# Patient Record
Sex: Female | Born: 1935
Health system: Southern US, Community
[De-identification: ages and names within clinical notes are randomized; demographics above are authoritative.]

## PROBLEM LIST (undated history)

## (undated) DIAGNOSIS — R001 Bradycardia, unspecified: Secondary | ICD-10-CM

## (undated) DIAGNOSIS — Z950 Presence of cardiac pacemaker: Secondary | ICD-10-CM

## (undated) DIAGNOSIS — I5020 Unspecified systolic (congestive) heart failure: Secondary | ICD-10-CM

## (undated) DIAGNOSIS — M1990 Unspecified osteoarthritis, unspecified site: Secondary | ICD-10-CM

## (undated) DIAGNOSIS — I509 Heart failure, unspecified: Secondary | ICD-10-CM

## (undated) DIAGNOSIS — I10 Essential (primary) hypertension: Secondary | ICD-10-CM

## (undated) DIAGNOSIS — E785 Hyperlipidemia, unspecified: Secondary | ICD-10-CM

## (undated) DIAGNOSIS — I459 Conduction disorder, unspecified: Secondary | ICD-10-CM

## (undated) DIAGNOSIS — Z8619 Personal history of other infectious and parasitic diseases: Secondary | ICD-10-CM

## (undated) DIAGNOSIS — I358 Other nonrheumatic aortic valve disorders: Secondary | ICD-10-CM

## (undated) DIAGNOSIS — R0600 Dyspnea, unspecified: Secondary | ICD-10-CM

## (undated) DIAGNOSIS — I639 Cerebral infarction, unspecified: Secondary | ICD-10-CM

## (undated) DIAGNOSIS — I480 Paroxysmal atrial fibrillation: Secondary | ICD-10-CM

## (undated) DIAGNOSIS — Z9289 Personal history of other medical treatment: Secondary | ICD-10-CM

## (undated) DIAGNOSIS — E119 Type 2 diabetes mellitus without complications: Secondary | ICD-10-CM

## (undated) HISTORY — DX: Personal history of other infectious and parasitic diseases: Z86.19

## (undated) HISTORY — DX: Presence of cardiac pacemaker: Z95.0

## (undated) HISTORY — DX: Bradycardia, unspecified: R00.1

## (undated) HISTORY — DX: Dyspnea, unspecified: R06.00

## (undated) HISTORY — DX: Unspecified osteoarthritis, unspecified site: M19.90

## (undated) HISTORY — PX: ABDOMINAL HYSTERECTOMY: SHX81

## (undated) HISTORY — PX: OTHER SURGICAL HISTORY: SHX169

## (undated) HISTORY — DX: Conduction disorder, unspecified: I45.9

## (undated) HISTORY — DX: Paroxysmal atrial fibrillation: I48.0

## (undated) HISTORY — DX: Unspecified systolic (congestive) heart failure: I50.20

## (undated) HISTORY — DX: Other nonrheumatic aortic valve disorders: I35.8

## (undated) HISTORY — DX: Cerebral infarction, unspecified: I63.9

## (undated) HISTORY — PX: JOINT REPLACEMENT: SHX530

## (undated) HISTORY — PX: PACEMAKER PLACEMENT: SHX43

## (undated) HISTORY — DX: Hyperlipidemia, unspecified: E78.5

## (undated) HISTORY — DX: Personal history of other medical treatment: Z92.89

---

## 1994-06-06 HISTORY — PX: LUMBAR FUSION: SHX111

## 1997-11-13 ENCOUNTER — Ambulatory Visit (HOSPITAL_COMMUNITY): Admission: RE | Admit: 1997-11-13 | Discharge: 1997-11-13 | Payer: Self-pay | Admitting: Neurosurgery

## 2003-06-07 DIAGNOSIS — I639 Cerebral infarction, unspecified: Secondary | ICD-10-CM

## 2003-06-07 HISTORY — DX: Cerebral infarction, unspecified: I63.9

## 2004-01-14 ENCOUNTER — Inpatient Hospital Stay (HOSPITAL_COMMUNITY): Admission: EM | Admit: 2004-01-14 | Discharge: 2004-01-16 | Payer: Self-pay | Admitting: Emergency Medicine

## 2004-08-03 ENCOUNTER — Ambulatory Visit: Payer: Self-pay | Admitting: Internal Medicine

## 2005-01-26 ENCOUNTER — Encounter: Payer: Self-pay | Admitting: Internal Medicine

## 2005-07-18 ENCOUNTER — Other Ambulatory Visit: Payer: Self-pay

## 2005-07-18 ENCOUNTER — Ambulatory Visit: Payer: Self-pay | Admitting: Unknown Physician Specialty

## 2005-07-28 ENCOUNTER — Ambulatory Visit: Payer: Self-pay | Admitting: Unknown Physician Specialty

## 2005-07-28 ENCOUNTER — Other Ambulatory Visit: Payer: Self-pay

## 2005-09-07 ENCOUNTER — Ambulatory Visit: Payer: Self-pay | Admitting: Internal Medicine

## 2006-09-27 ENCOUNTER — Ambulatory Visit: Payer: Self-pay | Admitting: Internal Medicine

## 2007-05-22 ENCOUNTER — Ambulatory Visit: Payer: Self-pay | Admitting: Gastroenterology

## 2007-10-01 ENCOUNTER — Ambulatory Visit: Payer: Self-pay | Admitting: Internal Medicine

## 2008-04-13 ENCOUNTER — Emergency Department (HOSPITAL_COMMUNITY): Admission: EM | Admit: 2008-04-13 | Discharge: 2008-04-13 | Payer: Self-pay | Admitting: Emergency Medicine

## 2008-11-18 ENCOUNTER — Ambulatory Visit: Payer: Self-pay | Admitting: Internal Medicine

## 2009-02-17 ENCOUNTER — Ambulatory Visit: Payer: Self-pay | Admitting: Neurosurgery

## 2009-11-19 ENCOUNTER — Ambulatory Visit: Payer: Self-pay | Admitting: Internal Medicine

## 2010-09-13 ENCOUNTER — Ambulatory Visit: Payer: Self-pay

## 2010-10-22 NOTE — Discharge Summary (Signed)
NAME:  Tamara Campbell, Tamara Campbell                          ACCOUNT NO.:  [id number]   MEDICAL RECORD NO.:  [id number]                   PATIENT TYPE:  INP   LOCATION:  5524                                 FACILITY:  [facility]   PHYSICIAN:  C. Ulyess Mort, M.D.             DATE OF BIRTH:  10-Nov-1930   DATE OF ADMISSION:  01/14/2004  DATE OF DISCHARGE:  01/16/2004                                 DISCHARGE SUMMARY   RESIDENT PHYSICIAN:  Kathe Mariner, M.D.   DISCHARGE DIAGNOSES:  1. Ischemic cerebrovascular accident.  2. Anemia.  3. Hypertension.  4. Diabetes mellitus type 2.   DISCHARGE MEDICATIONS:  1. Aggrenox one p.o. b.i.d.  2. Foltx one p.o. daily.  3. Zocor 40 mg one p.o. q.h.s.  4. Ziac 10/6.25 one p.o. daily.  5. Clonidine 0.3 mg one p.o. b.i.d.  6. Norvasc 10 mg one p.o. daily.  7. Lotensin 20 mg one p.o. daily.  8. Avandamet 2/500 one p.o. b.i.d.   DISPOSITION:  At discharge, stable.   FOLLOWUP:  Follow up with Dr. Yates Decamp in [place], [place]  within two weeks.  The patient will make appointment at her convenience.  Also, of note, for Dr. Dan Humphreys, patient was evaluated by PT and it was their  recommendation that she have continual physical therapy rehabilitation of  her right arm and hand weakness.  Also, may want to follow up with her  hemoglobin and hematocrit given her discovery of anemia with a low normal  ferritin and low B12 and distant history of melanotic stool.  May consider  colonoscopy work-up as an outpatient.   PROCEDURE PERFORMED:  1. CT of head without contrast which found chronic small vessel disease, but     no acute intracranial abnormalities.  2. MRI/MRA of the brain which found acute areas of subcortical white matter     infarction into the left posterior frontal cortex, significant atrophy,     and small vessel disease, diffusely small basilar artery with a focal     proximal stenosis estimated to be 75-90%, but no significant carotid     siphon  stenosis.  3. Additionally, carotid Dopplers were done which showed no significant     internal carotid artery stenosis bilaterally.  Vertebral artery flow was     antegrade.  Of note, increased velocities in both the CCAs and the right     vertebral of unknown etiology.  It was also noted that study was     technically difficult due to a high bifurcation.   ADMISSION HISTORY:  Tamara Campbell is a 77 year old right-handed African-  American woman with a past medical history significant for hypertension,  hyperlipidemia, and diabetes who presented with a 36-hour history of new  onset right arm weakness/heaviness.  She described the weakness as started  upon arising the day prior to arrival.  She denied any new strenuous  activity or any trauma to  the arm on the day prior.  She says that it was  difficult for her to hold anything or to push off her arm or dress herself.  She called her daughter who took her to [facility].  There she was  evaluated and given a tentative diagnosis of TIA after head CT was negative  and sent home.  Since she has been home she says the weakness has continued  but it has not progressed.  She denied any associated numbness or tingling.  She denied any slurred speech, loss of consciousness, nausea, vomiting,  vision changes, headaches, urinary or fecal incontinence.  Additionally, she  denied any chest pain, shortness of breath, or palpitations.  Also, of note,  her husband of 47 years passed away one week prior to arrival.  His funeral  was three days before she arrived at the hospital.   PHYSICAL EXAMINATION:  VITAL SIGNS:  Pulse 79, blood pressure 120/64,  temperature 98.2, respirations 22, O2 saturation 97% on room air.  GENERAL:  She was a mildly obese, pleasant African-American woman in no  acute distress.  HEENT:  Normocephalic, atraumatic.  Eyes:  Pupils are equal, round, reactive  to light and accommodation.  Extraocular muscle intact.  ENT:  Mucous   membranes moist.  Oropharynx without erythema or exudate.  Of note, multiple  dental fillings were noted.  NECK:  Supple.  No lymphadenopathy or thyromegaly.  No carotid bruits noted.  PULMONARY:  Clear to auscultation bilaterally.  No use of accessory muscles.  CARDIOVASCULAR:  Regular rate and rhythm.  2/6 systolic murmur best heard at  the right upper sternal border.  No radiation to carotids.  2+ pulses  globally.  GASTROINTESTINAL:  Soft, nontender, nondistended abdomen with positive bowel  sounds.  No evidence of rebound or guarding.  EXTREMITIES:  She had mild trace edema bilaterally in her lower extremities.  SKIN:  Warm without evidence of rashes or lesions.  MUSCULOSKELETAL:  4/5 strength in her right upper extremity, 5/5 strength in  her left upper extremity, 5/5 strength in her lower extremities bilaterally,  and good grip strength bilaterally.  No increased weakness noted on the  right as compared to the left.  NEUROLOGIC:  She was alert and oriented x3.  Cranial nerves II-XII were  grossly intact.  She had normal sensation throughout.  She had evidence of  some mild dysmetria on finger-to-nose testing on the right, normal on the  left.  She had mild decrease in her right arm orbiting and she had downward  going Babinski's.  No facial asymmetry was noted.  No ptosis noted.  She had  2+ reflexes globally.   Admission EKG showed a normal sinus rhythm with a rate of approximately 60  and normal axis.  PR intervals were 175 and an increased QRS interval of  142, regular rate and rhythm was noted on EKG as well as flipped T-waves in  3, aVR, and V1.  Admission laboratories:  Sodium 130, potassium 3.4,  chloride 105, bicarbonate 25, BUN 21, creatinine 1.2, glucose 131.  Hemoglobin 9.6, hematocrit 28.9, white blood cell count 4.1, platelets 267,  MCV 80.5, ANC 2.8.  PT 12.4, INR 0.9, PTT 27.   PROBLEM: 1. Ischemic cerebrovascular accident.  The patient was admitted and worked      up for a possible ischemic stroke given her significant risk factors and     right arm weakness that had not resolved.  There was also a concern that  this may be secondary to a conversion reaction because of the stress of     death of her long-time husband.  However, it was discovered on MRI of the     brain that there was an ischemic area of infarction in the left posterior     frontal cortex corresponding with her deficit.  In addition, fasting     lipid panel and cardiac enzymes were ordered.  Cardiac enzymes were     negative.  Because the first set was negative, it was decided not to     cycle these two additional sets because of a low probability of this     being a cardiac event.  Additionally, homocysteine level was evaluated.     This is a risk factor for ischemic stroke.  Her homocysteine level did     return elevated.  Her fasting lipid panel returned within normal limits     with an LDL less than 100 and HDL greater than 40 so no change was made     in her medicines for hyperlipidemia.  PT and OT were asked to evaluate     patient and recommended continued outpatient therapy for rehabilitation     of the right arm and hand.  She will be placed on Aggrenox in order to     help prevent future ischemic event as well as Foltx due to her     hyperhomocysteinemia.  2. Anemia.  The patient's anemia was evaluated by ordering a ferritin which     returned at 36.  A B12 returned at 279.  Given her MCV of 80.5,     borderline low normal, and a hemoglobin of 9.9, it is concerning that Ms.     Fant may be having a GI bleed.  Unable to obtain guaiac of her stools     during this hospital stay.  However, she did describe to me one episode     about three months ago of melanotic stools, so as noted in the     recommendations, I would recommend a colonoscopy in the future to follow     this up.  She was not given any blood during her hospital stay and her     hemoglobin remained stable.   3. Hypertension.  Ms. Salazar was restarted on all home medications.  She did     have a transiently high blood pressure of 170/48 on day two but at time     of discharge her blood pressure was normotensive at 120/64, appropriate     for someone with diabetes.  No change was made in her antihypertensive     regimen.  4. Diabetes.  A1C was measured during hospital stay which was 6.0.  She was     continued on her Avandamet 2/500 b.i.d. while in hospital with good blood     sugar control.   DISCHARGE LABORATORIES:  CBC:  Hemoglobin 9.9, hematocrit 29.7, white blood  cell count 4.8, platelets 265.  Basic metabolic panel:  Sodium 141,  potassium 3.8, chloride 106, bicarbonate 27, BUN 19, creatinine 1.0, glucose  97, __________ folate 439.  Homocysteine 15.5.  Hemoglobin A1C 6.0.  Fasting  lipid panel:  Total cholesterol 163, triglycerides 64, HDL cholesterol 59,  LDL cholesterol 91.  TSH 0.943.  B12 279, ferritin 36.      Keitha Butte, MD  Gary Fleet, M.D.    AK/MEDQ  D:  01/16/2004  T:  01/17/2004  Job:  [id number]   cc:   Yates Decamp  [address]  [place]  [place] [address]  Fax: [phone]

## 2010-12-06 ENCOUNTER — Ambulatory Visit: Payer: Self-pay | Admitting: Internal Medicine

## 2011-03-07 DIAGNOSIS — Z9289 Personal history of other medical treatment: Secondary | ICD-10-CM

## 2011-03-07 HISTORY — DX: Personal history of other medical treatment: Z92.89

## 2011-03-08 LAB — BASIC METABOLIC PANEL
BUN: 28 — ABNORMAL HIGH
CO2: 27
Calcium: 9.5
Chloride: 103
Creatinine, Ser: 1.04
GFR calc Af Amer: >60
GFR calc non Af Amer: 52 — ABNORMAL LOW
Glucose, Bld: 82
Potassium: 4

## 2011-03-08 LAB — DIFFERENTIAL
Basophils Relative: 1
Neutro Abs: 1.3 — ABNORMAL LOW

## 2011-03-08 LAB — CBC
Hemoglobin: 12.3
Platelets: 175
RBC: 4.34
WBC: 3.1 — ABNORMAL LOW

## 2012-08-10 HISTORY — PX: TOTAL KNEE ARTHROPLASTY: SHX125

## 2014-08-18 DIAGNOSIS — Z471 Aftercare following joint replacement surgery: Secondary | ICD-10-CM | POA: Diagnosis not present

## 2014-08-18 DIAGNOSIS — Z96641 Presence of right artificial hip joint: Secondary | ICD-10-CM | POA: Diagnosis not present

## 2014-08-18 DIAGNOSIS — Z96642 Presence of left artificial hip joint: Secondary | ICD-10-CM | POA: Diagnosis not present

## 2014-08-18 DIAGNOSIS — Z96651 Presence of right artificial knee joint: Secondary | ICD-10-CM | POA: Diagnosis not present

## 2014-09-15 DIAGNOSIS — H4011X3 Primary open-angle glaucoma, severe stage: Secondary | ICD-10-CM | POA: Diagnosis not present

## 2014-10-06 DIAGNOSIS — I10 Essential (primary) hypertension: Secondary | ICD-10-CM | POA: Diagnosis not present

## 2014-10-06 DIAGNOSIS — E119 Type 2 diabetes mellitus without complications: Secondary | ICD-10-CM | POA: Diagnosis not present

## 2014-10-22 DIAGNOSIS — B351 Tinea unguium: Secondary | ICD-10-CM | POA: Diagnosis not present

## 2014-10-22 DIAGNOSIS — E119 Type 2 diabetes mellitus without complications: Secondary | ICD-10-CM | POA: Diagnosis not present

## 2014-10-22 DIAGNOSIS — M76822 Posterior tibial tendinitis, left leg: Secondary | ICD-10-CM | POA: Diagnosis not present

## 2014-11-17 DIAGNOSIS — I459 Conduction disorder, unspecified: Secondary | ICD-10-CM | POA: Diagnosis not present

## 2014-11-17 DIAGNOSIS — I10 Essential (primary) hypertension: Secondary | ICD-10-CM | POA: Diagnosis not present

## 2014-11-17 DIAGNOSIS — Z950 Presence of cardiac pacemaker: Secondary | ICD-10-CM | POA: Diagnosis not present

## 2014-11-17 DIAGNOSIS — I340 Nonrheumatic mitral (valve) insufficiency: Secondary | ICD-10-CM | POA: Diagnosis not present

## 2015-01-08 ENCOUNTER — Inpatient Hospital Stay (HOSPITAL_COMMUNITY)
Admission: EM | Admit: 2015-01-08 | Discharge: 2015-01-13 | DRG: 062 | Disposition: A | Discharge status: Skilled Nursing Facility | Payer: Commercial Managed Care - HMO | Attending: Neurology | Admitting: Neurology

## 2015-01-08 ENCOUNTER — Encounter (HOSPITAL_COMMUNITY): Payer: Self-pay | Admitting: *Deleted

## 2015-01-08 ENCOUNTER — Emergency Department (HOSPITAL_COMMUNITY): Payer: Commercial Managed Care - HMO

## 2015-01-08 DIAGNOSIS — R29898 Other symptoms and signs involving the musculoskeletal system: Secondary | ICD-10-CM | POA: Diagnosis not present

## 2015-01-08 DIAGNOSIS — I517 Cardiomegaly: Secondary | ICD-10-CM | POA: Diagnosis not present

## 2015-01-08 DIAGNOSIS — I872 Venous insufficiency (chronic) (peripheral): Secondary | ICD-10-CM | POA: Diagnosis not present

## 2015-01-08 DIAGNOSIS — Z9660 Presence of unspecified orthopedic joint implant: Secondary | ICD-10-CM | POA: Diagnosis present

## 2015-01-08 DIAGNOSIS — G8190 Hemiplegia, unspecified affecting unspecified side: Secondary | ICD-10-CM | POA: Diagnosis not present

## 2015-01-08 DIAGNOSIS — I638 Other cerebral infarction: Secondary | ICD-10-CM | POA: Diagnosis not present

## 2015-01-08 DIAGNOSIS — I6340 Cerebral infarction due to embolism of unspecified cerebral artery: Secondary | ICD-10-CM | POA: Diagnosis not present

## 2015-01-08 DIAGNOSIS — R414 Neurologic neglect syndrome: Secondary | ICD-10-CM | POA: Diagnosis present

## 2015-01-08 DIAGNOSIS — I429 Cardiomyopathy, unspecified: Secondary | ICD-10-CM | POA: Diagnosis not present

## 2015-01-08 DIAGNOSIS — I472 Ventricular tachycardia, unspecified: Secondary | ICD-10-CM

## 2015-01-08 DIAGNOSIS — I10 Essential (primary) hypertension: Secondary | ICD-10-CM | POA: Diagnosis present

## 2015-01-08 DIAGNOSIS — Z950 Presence of cardiac pacemaker: Secondary | ICD-10-CM | POA: Diagnosis not present

## 2015-01-08 DIAGNOSIS — Z79899 Other long term (current) drug therapy: Secondary | ICD-10-CM

## 2015-01-08 DIAGNOSIS — Z9119 Patient's noncompliance with other medical treatment and regimen: Secondary | ICD-10-CM | POA: Diagnosis present

## 2015-01-08 DIAGNOSIS — I63511 Cerebral infarction due to unspecified occlusion or stenosis of right middle cerebral artery: Secondary | ICD-10-CM | POA: Diagnosis not present

## 2015-01-08 DIAGNOSIS — I480 Paroxysmal atrial fibrillation: Secondary | ICD-10-CM | POA: Diagnosis not present

## 2015-01-08 DIAGNOSIS — I4891 Unspecified atrial fibrillation: Secondary | ICD-10-CM | POA: Diagnosis not present

## 2015-01-08 DIAGNOSIS — R262 Difficulty in walking, not elsewhere classified: Secondary | ICD-10-CM | POA: Diagnosis not present

## 2015-01-08 DIAGNOSIS — I63411 Cerebral infarction due to embolism of right middle cerebral artery: Secondary | ICD-10-CM | POA: Diagnosis not present

## 2015-01-08 DIAGNOSIS — H409 Unspecified glaucoma: Secondary | ICD-10-CM | POA: Diagnosis not present

## 2015-01-08 DIAGNOSIS — G936 Cerebral edema: Secondary | ICD-10-CM | POA: Diagnosis not present

## 2015-01-08 DIAGNOSIS — I651 Occlusion and stenosis of basilar artery: Secondary | ICD-10-CM | POA: Diagnosis not present

## 2015-01-08 DIAGNOSIS — E119 Type 2 diabetes mellitus without complications: Secondary | ICD-10-CM | POA: Diagnosis present

## 2015-01-08 DIAGNOSIS — R29810 Facial weakness: Secondary | ICD-10-CM | POA: Diagnosis present

## 2015-01-08 DIAGNOSIS — Z87891 Personal history of nicotine dependence: Secondary | ICD-10-CM

## 2015-01-08 DIAGNOSIS — W19XXXA Unspecified fall, initial encounter: Secondary | ICD-10-CM | POA: Diagnosis not present

## 2015-01-08 DIAGNOSIS — I6789 Other cerebrovascular disease: Secondary | ICD-10-CM | POA: Diagnosis not present

## 2015-01-08 DIAGNOSIS — E785 Hyperlipidemia, unspecified: Secondary | ICD-10-CM | POA: Diagnosis present

## 2015-01-08 DIAGNOSIS — E784 Other hyperlipidemia: Secondary | ICD-10-CM | POA: Diagnosis not present

## 2015-01-08 DIAGNOSIS — I63419 Cerebral infarction due to embolism of unspecified middle cerebral artery: Secondary | ICD-10-CM | POA: Diagnosis not present

## 2015-01-08 DIAGNOSIS — I4729 Other ventricular tachycardia: Secondary | ICD-10-CM

## 2015-01-08 DIAGNOSIS — G8194 Hemiplegia, unspecified affecting left nondominant side: Secondary | ICD-10-CM | POA: Diagnosis not present

## 2015-01-08 DIAGNOSIS — E876 Hypokalemia: Secondary | ICD-10-CM | POA: Diagnosis present

## 2015-01-08 DIAGNOSIS — R531 Weakness: Secondary | ICD-10-CM | POA: Diagnosis present

## 2015-01-08 DIAGNOSIS — I639 Cerebral infarction, unspecified: Secondary | ICD-10-CM | POA: Diagnosis not present

## 2015-01-08 DIAGNOSIS — I481 Persistent atrial fibrillation: Secondary | ICD-10-CM | POA: Diagnosis not present

## 2015-01-08 DIAGNOSIS — I69351 Hemiplegia and hemiparesis following cerebral infarction affecting right dominant side: Secondary | ICD-10-CM | POA: Diagnosis not present

## 2015-01-08 DIAGNOSIS — I69354 Hemiplegia and hemiparesis following cerebral infarction affecting left non-dominant side: Secondary | ICD-10-CM | POA: Diagnosis not present

## 2015-01-08 DIAGNOSIS — S81802A Unspecified open wound, left lower leg, initial encounter: Secondary | ICD-10-CM | POA: Diagnosis present

## 2015-01-08 DIAGNOSIS — I77819 Aortic ectasia, unspecified site: Secondary | ICD-10-CM | POA: Diagnosis not present

## 2015-01-08 DIAGNOSIS — J9811 Atelectasis: Secondary | ICD-10-CM | POA: Diagnosis not present

## 2015-01-08 DIAGNOSIS — M6281 Muscle weakness (generalized): Secondary | ICD-10-CM | POA: Diagnosis not present

## 2015-01-08 DIAGNOSIS — E1142 Type 2 diabetes mellitus with diabetic polyneuropathy: Secondary | ICD-10-CM

## 2015-01-08 HISTORY — DX: Heart failure, unspecified: I50.9

## 2015-01-08 HISTORY — DX: Type 2 diabetes mellitus without complications: E11.9

## 2015-01-08 HISTORY — DX: Cerebral infarction, unspecified: I63.9

## 2015-01-08 HISTORY — DX: Essential (primary) hypertension: I10

## 2015-01-08 LAB — I-STAT CHEM 8, ED
BUN: 24 mg/dL — AB (ref 6–20)
CHLORIDE: 106 mmol/L (ref 101–111)
Calcium, Ion: 1.09 mmol/L — ABNORMAL LOW (ref 1.13–1.30)
Creatinine, Ser: 1 mg/dL (ref 0.44–1.00)
GLUCOSE: 172 mg/dL — AB (ref 65–99)
HEMATOCRIT: 46 % (ref 36.0–46.0)
HEMOGLOBIN: 15.6 g/dL — AB (ref 12.0–15.0)
Potassium: 3.4 mmol/L — ABNORMAL LOW (ref 3.5–5.1)
Sodium: 143 mmol/L (ref 135–145)
TCO2: 20 mmol/L (ref 0–100)

## 2015-01-08 LAB — CBC
HEMATOCRIT: 41.7 % (ref 36.0–46.0)
Hemoglobin: 13.6 g/dL (ref 12.0–15.0)
MCH: 27.5 pg (ref 26.0–34.0)
MCHC: 32.6 g/dL (ref 30.0–36.0)
MCV: 84.4 fL (ref 78.0–100.0)
PLATELETS: 201 10*3/uL (ref 150–400)
RBC: 4.94 MIL/uL (ref 3.87–5.11)
RDW: 15.4 % (ref 11.5–15.5)
WBC: 3.7 10*3/uL — AB (ref 4.0–10.5)

## 2015-01-08 LAB — COMPREHENSIVE METABOLIC PANEL
ALK PHOS: 90 U/L (ref 38–126)
ALT: 20 U/L (ref 14–54)
AST: 28 U/L (ref 15–41)
Albumin: 3.8 g/dL (ref 3.5–5.0)
Anion gap: 12 (ref 5–15)
BILIRUBIN TOTAL: 0.7 mg/dL (ref 0.3–1.2)
BUN: 21 mg/dL — ABNORMAL HIGH (ref 6–20)
CHLORIDE: 106 mmol/L (ref 101–111)
CO2: 21 mmol/L — AB (ref 22–32)
Calcium: 9.4 mg/dL (ref 8.9–10.3)
Creatinine, Ser: 1.05 mg/dL — ABNORMAL HIGH (ref 0.44–1.00)
GFR, EST AFRICAN AMERICAN: 56 mL/min — AB (ref >60)
GFR, EST NON AFRICAN AMERICAN: 48 mL/min — AB (ref >60)
Glucose, Bld: 169 mg/dL — ABNORMAL HIGH (ref 65–99)
Potassium: 3.4 mmol/L — ABNORMAL LOW (ref 3.5–5.1)
Sodium: 139 mmol/L (ref 135–145)
Total Protein: 7 g/dL (ref 6.5–8.1)

## 2015-01-08 LAB — DIFFERENTIAL
BASOS ABS: 0 10*3/uL (ref 0.0–0.1)
BASOS PCT: 0 % (ref 0–1)
EOS PCT: 2 % (ref 0–5)
Eosinophils Absolute: 0.1 10*3/uL (ref 0.0–0.7)
LYMPHS ABS: 1.1 10*3/uL (ref 0.7–4.0)
LYMPHS PCT: 29 % (ref 12–46)
MONO ABS: 0.2 10*3/uL (ref 0.1–1.0)
MONOS PCT: 7 % (ref 3–12)
NEUTROS ABS: 2.3 10*3/uL (ref 1.7–7.7)
Neutrophils Relative %: 62 % (ref 43–77)

## 2015-01-08 LAB — I-STAT TROPONIN, ED: Troponin i, poc: 0.01 ng/mL (ref 0.00–0.08)

## 2015-01-08 LAB — ETHANOL

## 2015-01-08 LAB — APTT: aPTT: 27 seconds (ref 24–37)

## 2015-01-08 LAB — PROTIME-INR
INR: 1.12 (ref 0.00–1.49)
Prothrombin Time: 14.6 seconds (ref 11.6–15.2)

## 2015-01-08 MED ORDER — ALTEPLASE (STROKE) FULL DOSE INFUSION
0.9000 mg/kg | Freq: Once | INTRAVENOUS | Status: AC
  Administered 2015-01-08: 52 mg via INTRAVENOUS
  Filled 2015-01-08: qty 52

## 2015-01-08 MED ORDER — LABETALOL HCL 5 MG/ML IV SOLN
INTRAVENOUS | Status: AC
  Administered 2015-01-08: 5 mg via INTRAVENOUS
  Administered 2015-01-08: 10 mg
  Administered 2015-01-08: 5 mg via INTRAVENOUS
  Filled 2015-01-08: qty 4

## 2015-01-08 NOTE — ED Notes (Signed)
Patient transported to CT with RN transport. Phlebotomy obtained blood prior to start of scan.

## 2015-01-08 NOTE — ED Notes (Signed)
Per [facility]- The patient lives at home with her son. She noted herself to be fine while eating dinner around 6pm but when she went to stand up she noticed her left arm felt weak and fell. CBG 144. Pt was hypertensive 200/120s. Pt has weakness to her left arm and legs. Pt also was noted to have a "syncopal" episode where she gazed to the right and would not answer questions in route. Pt is alert and oriented at present. PIV 20G placed to LAC.

## 2015-01-08 NOTE — H&P (Addendum)
Admission H&P    Chief Complaint: Left sided weakness  HPI: Tamara Campbell is an 81 y.o. female with a history of HTN and diabetes who re[ports noncompliance with her antihypertensives who was at her baseline today.  This evening she sat down to eat supper and noted no problems. When she attempted to get up to leave the table she was unable to support herself and fell.  She noted left sided weakness. EMS was called at that time.  Patient was hypertensive. En route was noted to have a starring spell when she gazed to the right and did not respond to questioning.   Patient has had a stroke in the past in 2005.  Patient had some mild right sided weakness as residual from this per daughter.  At baseline patient lives with son.  She ambulates with a [employer].  She dresses herself and goes to the bathroom independently.  She does not report incontinence but does wear Depends because she reports that she does not always make it to the bathroom.  Her son does the cooking and cleaning.    Date last known well: Date: 01/08/2015 Time last known well: Time: 18:00 tPA Given: Yes  Modified Rankin: Rankin Score=3  Past Medical History  Diagnosis Date  . Diabetes mellitus without complication   . Hypertension   . Stroke   . CHF (congestive heart failure)     Past Surgical History  Procedure Laterality Date  . Joint replacement    . Abdominal hysterectomy    . Pacemaker placement      Family history: Brother with diabetes  Social History:  reports that she has quit smoking. She has never used smokeless tobacco. She reports that she does not drink alcohol or use illicit drugs.  Allergies: No Known Allergies  Medications: Prior to Admission medications   Medication Sig Start Date End Date Taking? Authorizing Provider  amLODipine-benazepril (LOTREL) 10-20 MG per capsule Take 1 capsule by mouth daily.   Yes Historical Provider, MD  atenolol (TENORMIN) 50 MG tablet Take 50 mg by mouth daily.   Yes  Historical Provider, MD  bimatoprost (LUMIGAN) 0.01 % SOLN Place 1 drop into both eyes at bedtime.   Yes Historical Provider, MD  brinzolamide (AZOPT) 1 % ophthalmic suspension Place 1 drop into both eyes 2 (two) times daily.   Yes Historical Provider, MD  cholecalciferol (VITAMIN D) 1000 UNITS tablet Take 2,000 Units by mouth daily.   Yes Historical Provider, MD  ferrous sulfate 325 (65 FE) MG tablet Take 325 mg by mouth daily with breakfast.   Yes Historical Provider, MD  hydrochlorothiazide (HYDRODIURIL) 25 MG tablet Take 25 mg by mouth daily.   Yes Historical Provider, MD  metFORMIN (GLUCOPHAGE) 500 MG tablet Take 500 mg by mouth 2 (two) times daily with a meal.   Yes Historical Provider, MD  potassium chloride (K-DUR,KLOR-CON) 10 MEQ tablet Take 10 mEq by mouth daily.   Yes Historical Provider, MD    ROS: History obtained from the patient  General ROS: negative for - chills, fatigue, fever, night sweats, weight gain or weight loss Psychological ROS: negative for - behavioral disorder, hallucinations, memory difficulties, mood swings or suicidal ideation Ophthalmic ROS: negative for - blurry vision, double vision, eye pain or loss of vision ENT ROS: negative for - epistaxis, nasal discharge, oral lesions, sore throat, tinnitus or vertigo Allergy and Immunology ROS: negative for - hives or itchy/watery eyes Hematological and Lymphatic ROS: negative for - bleeding problems, bruising or  swollen lymph nodes Endocrine ROS: negative for - galactorrhea, hair pattern changes, polydipsia/polyuria or temperature intolerance Respiratory ROS: negative for - cough, hemoptysis, shortness of breath or wheezing Cardiovascular ROS: negative for - chest pain, dyspnea on exertion, edema or irregular heartbeat Gastrointestinal ROS: negative for - abdominal pain, diarrhea, hematemesis, nausea/vomiting or stool incontinence Genito-Urinary ROS: negative for - dysuria, hematuria, incontinence or urinary  frequency/urgency Musculoskeletal ROS: back pain Neurological ROS: as noted in HPI Dermatological ROS: draining blister on right leg  Physical Examination: Blood pressure 148/88, pulse 67, temperature 99 F (37.2 C), temperature source Oral, resp. rate 19, weight 58.06 kg (128 lb), SpO2 99 %.  General Examination:  HEENT-  Normocephalic, no lesions, without obvious abnormality.  Normal external eye and conjunctiva.  Normal TM's bilaterally.  Normal auditory canals and external ears. Normal external nose, mucus membranes and septum.  Normal pharynx. Cardiovascular- S1, S2 normal, pulses palpable throughout   Lungs- chest clear, no wheezing, rales, normal symmetric air entry Abdomen- soft, non-tender; bowel sounds normal; no masses,  no organomegaly Extremities- BLE 2+ edema.  Open sore on left lower leg.  Draining blister on right lower leg. Lymph-no adenopathy palpable Musculoskeletal-no joint tenderness, deformity or swelling Skin-As noted above  Neurological Examination Mental Status: Alert, oriented, thought content appropriate.  Speech fluent without evidence of aphasia.  Able to follow 3 step commands without difficulty.  Left neglect. Cranial Nerves: II: Discs flat bilaterally; Visual fields grossly normal, pupils equal, round, reactive to light and accommodation III,IV, VI: ptosis not present, right gaze preference but able to achieve full extra-ocular motions bilaterally V,VII: left facial droop, facial light touch sensation normal bilaterally VIII: hearing normal bilaterally IX,X: gag reflex present XI: bilateral shoulder shrug XII: midline tongue extension Motor: Right : Upper extremity   5/5    Left:     Upper extremity   3-/5  Lower extremity   3+/5    Lower extremity   1-2/5 Tone and bulk:normal tone throughout; no atrophy noted Sensory: Pinprick and light touch decreased in the left upper extremity Deep Tendon Reflexes: 2+ and symmetric throughout Plantars: Right:  downgoing   Left: downgoing Cerebellar: normal finger-to-nose and normal heel-to-shin testing on the right.  Unable to perform on the left due to weakness Gait: not tested due to safety concerns   Laboratory Studies:   Basic Metabolic Panel:  Recent Labs Lab 01/08/15 2131 01/08/15 2139  NA 139 143  K 3.4* 3.4*  CL 106 106  CO2 21*  --   GLUCOSE 169* 172*  BUN 21* 24*  CREATININE 1.05* 1.00  CALCIUM 9.4  --     Liver Function Tests:  Recent Labs Lab 01/08/15 2131  AST 28  ALT 20  ALKPHOS 90  BILITOT 0.7  PROT 7.0  ALBUMIN 3.8   No results for input(s): LIPASE, AMYLASE in the last 168 hours. No results for input(s): AMMONIA in the last 168 hours.  CBC:  Recent Labs Lab 01/08/15 2131 01/08/15 2139  WBC 3.7*  --   NEUTROABS 2.3  --   HGB 13.6 15.6*  HCT 41.7 46.0  MCV 84.4  --   PLT 201  --     Cardiac Enzymes: No results for input(s): CKTOTAL, CKMB, CKMBINDEX, TROPONINI in the last 168 hours.  BNP: Invalid input(s): POCBNP  CBG: No results for input(s): GLUCAP in the last 168 hours.  Microbiology: No results found for this or any previous visit.  Coagulation Studies:  Recent Labs  01/08/15 2131  LABPROT  14.6  INR 1.12    Urinalysis: No results for input(s): COLORURINE, LABSPEC, PHURINE, GLUCOSEU, HGBUR, BILIRUBINUR, KETONESUR, PROTEINUR, UROBILINOGEN, NITRITE, LEUKOCYTESUR in the last 168 hours.  Invalid input(s): APPERANCEUR  Lipid Panel:  No results found for: CHOL, TRIG, HDL, CHOLHDL, VLDL, LDLCALC  HgbA1C: No results found for: HGBA1C  Urine Drug Screen:  No results found for: LABOPIA, COCAINSCRNUR, LABBENZ, AMPHETMU, THCU, LABBARB  Alcohol Level:   Recent Labs Lab 01/08/15 2131  ETH <5    Other results: EKG: atrial fibrillation, rate 85 bpm.  Imaging: Ct Head Wo Contrast  01/08/2015   CLINICAL DATA:  Left arm weakness.  EXAM: CT HEAD WITHOUT CONTRAST  TECHNIQUE: Contiguous axial images were obtained from the base of the  skull through the vertex without intravenous contrast.  COMPARISON:  Head CT scan 01/14/2004.  FINDINGS: There is atrophy and extensive chronic microvascular ischemic change. No acute intracranial abnormality including hemorrhage, infarct, mass lesion, mass effect, midline shift or abnormal extra-axial fluid collection is identified. No hydrocephalus or pneumocephalus. The calvarium is intact.  IMPRESSION: No acute abnormality.  Atrophy and extensive chronic microvascular ischemic change.   Electronically Signed   By: Drusilla Kanner M.D.   On: 01/08/2015 21:43    Assessment: 81 y.o. female with a history of diabetes and hypertension who presents with new onset left hemiparesis and left neglect.  Head CT reviewed independently and shows no acute changes but evidence of extensive small vessel disease.  BP elevated.  Risks and benefits of tPA discussed with patient and daughter.  Contraindications reviewed.  Additional time was required because it was unclear what medications the patient was taking.  This was not fully resolved until the patient's daughter arrived since the patient reported that she felt that she may be on an anticoagulant.  Once this was ruled out, BP was controlled and tPA was administered.  No complications noted.      Stroke Risk Factors - atrial fibrillation, diabetes mellitus and hypertension  Plan: 1. HgbA1c, fasting lipid panel 2. Wound consult 3. PT consult, OT consult, Speech consult 4. Echocardiogram 5. Carotid dopplers 6. Prophylactic therapy-None 7. NPO until RN stroke swallow screen 8. Telemetry monitoring 9. Frequent neuro checks 10. Admission to NICU 11. Repeat head CT in 24 hours.    This patient is critically ill and at significant risk of neurological worsening, death and care requires constant monitoring of vital signs, hemodynamics,respiratory and cardiac monitoring, neurological assessment, discussion with family, other specialists and medical decision making  of high complexity. I spent 90 minutes of neurocritical care time  in the care of  this patient.  Thana Farr, MD [facility] [phone] 01/08/2015  11:26 PM

## 2015-01-08 NOTE — ED Notes (Signed)
Patient to room from CT ?

## 2015-01-08 NOTE — ED Notes (Signed)
Patient had large serous filled blister to the right lower leg ( small amt of drainage noted) and opened area (pencil eraser sized) with yellowish area to the left lower leg

## 2015-01-09 ENCOUNTER — Inpatient Hospital Stay (HOSPITAL_COMMUNITY): Payer: Commercial Managed Care - HMO

## 2015-01-09 ENCOUNTER — Encounter (HOSPITAL_COMMUNITY): Payer: Self-pay

## 2015-01-09 ENCOUNTER — Ambulatory Visit (HOSPITAL_COMMUNITY): Payer: Commercial Managed Care - HMO

## 2015-01-09 DIAGNOSIS — I63411 Cerebral infarction due to embolism of right middle cerebral artery: Principal | ICD-10-CM

## 2015-01-09 DIAGNOSIS — Z950 Presence of cardiac pacemaker: Secondary | ICD-10-CM

## 2015-01-09 DIAGNOSIS — E785 Hyperlipidemia, unspecified: Secondary | ICD-10-CM

## 2015-01-09 DIAGNOSIS — I10 Essential (primary) hypertension: Secondary | ICD-10-CM

## 2015-01-09 DIAGNOSIS — I6789 Other cerebrovascular disease: Secondary | ICD-10-CM

## 2015-01-09 LAB — BASIC METABOLIC PANEL
Anion gap: 11 (ref 5–15)
BUN: 21 mg/dL — ABNORMAL HIGH (ref 6–20)
CO2: 22 mmol/L (ref 22–32)
Calcium: 9.2 mg/dL (ref 8.9–10.3)
Chloride: 108 mmol/L (ref 101–111)
Creatinine, Ser: 0.93 mg/dL (ref 0.44–1.00)
GFR calc non Af Amer: 56 mL/min — ABNORMAL LOW (ref >60)
GLUCOSE: 130 mg/dL — AB (ref 65–99)
Potassium: 3.4 mmol/L — ABNORMAL LOW (ref 3.5–5.1)
SODIUM: 141 mmol/L (ref 135–145)

## 2015-01-09 LAB — GLUCOSE, CAPILLARY
GLUCOSE-CAPILLARY: 140 mg/dL — AB (ref 65–99)
GLUCOSE-CAPILLARY: 138 mg/dL — AB (ref 65–99)
GLUCOSE-CAPILLARY: 142 mg/dL — AB (ref 65–99)
Glucose-Capillary: 115 mg/dL — ABNORMAL HIGH (ref 65–99)
Glucose-Capillary: 109 mg/dL — ABNORMAL HIGH (ref 65–99)

## 2015-01-09 LAB — CBC
HEMATOCRIT: 40.7 % (ref 36.0–46.0)
Hemoglobin: 13.4 g/dL (ref 12.0–15.0)
MCH: 27.9 pg (ref 26.0–34.0)
MCHC: 32.9 g/dL (ref 30.0–36.0)
MCV: 84.8 fL (ref 78.0–100.0)
Platelets: 195 10*3/uL (ref 150–400)
RBC: 4.8 MIL/uL (ref 3.87–5.11)
RDW: 15.4 % (ref 11.5–15.5)
WBC: 3.7 10*3/uL — ABNORMAL LOW (ref 4.0–10.5)

## 2015-01-09 LAB — MRSA PCR SCREENING: MRSA by PCR: NEGATIVE

## 2015-01-09 LAB — LIPID PANEL
Cholesterol: 167 mg/dL (ref 0–200)
HDL: 58 mg/dL (ref >40)
LDL CALC: 100 mg/dL — AB (ref 0–99)
Total CHOL/HDL Ratio: 2.9 RATIO
Triglycerides: 47 mg/dL (ref <150)
VLDL: 9 mg/dL (ref 0–40)

## 2015-01-09 MED ORDER — ATENOLOL 25 MG PO TABS
25.0000 mg | ORAL_TABLET | Freq: Every day | ORAL | Status: DC
  Administered 2015-01-10: 25 mg via ORAL
  Filled 2015-01-09: qty 1

## 2015-01-09 MED ORDER — ONDANSETRON HCL 4 MG/2ML IJ SOLN: 4.0000 mg | Freq: Three times a day (TID) | INTRAMUSCULAR | Status: AC | PRN

## 2015-01-09 MED ORDER — FERROUS SULFATE 325 (65 FE) MG PO TABS
325.0000 mg | ORAL_TABLET | Freq: Every day | ORAL | Status: DC
  Administered 2015-01-10 – 2015-01-13 (×4): 325 mg via ORAL
  Filled 2015-01-09 (×4): qty 1

## 2015-01-09 MED ORDER — ASPIRIN 325 MG PO TABS
325.0000 mg | ORAL_TABLET | Freq: Every day | ORAL | Status: DC
  Administered 2015-01-09 – 2015-01-11 (×3): 325 mg via ORAL
  Filled 2015-01-09 (×3): qty 1

## 2015-01-09 MED ORDER — ACETAMINOPHEN 650 MG RE SUPP: 650.0000 mg | RECTAL | Status: DC | PRN

## 2015-01-09 MED ORDER — BACITRACIN-NEOMYCIN-POLYMYXIN OINTMENT TUBE
TOPICAL_OINTMENT | Freq: Every day | CUTANEOUS | Status: DC
  Administered 2015-01-09: 1 via TOPICAL
  Administered 2015-01-10 – 2015-01-13 (×4): via TOPICAL
  Filled 2015-01-09: qty 15

## 2015-01-09 MED ORDER — LABETALOL HCL 5 MG/ML IV SOLN: 10.0000 mg | INTRAVENOUS | Status: DC | PRN

## 2015-01-09 MED ORDER — LATANOPROST 0.005 % OP SOLN
1.0000 [drp] | Freq: Every day | OPHTHALMIC | Status: DC
  Administered 2015-01-09 – 2015-01-12 (×4): 1 [drp] via OPHTHALMIC
  Filled 2015-01-09: qty 2.5

## 2015-01-09 MED ORDER — LABETALOL HCL 5 MG/ML IV SOLN
10.0000 mg | INTRAVENOUS | Status: DC | PRN
  Administered 2015-01-11: 10 mg via INTRAVENOUS
  Filled 2015-01-09: qty 4

## 2015-01-09 MED ORDER — ACETAMINOPHEN 325 MG PO TABS
650.0000 mg | ORAL_TABLET | ORAL | Status: DC | PRN
  Administered 2015-01-11 – 2015-01-13 (×2): 650 mg via ORAL
  Filled 2015-01-09 (×2): qty 2

## 2015-01-09 MED ORDER — INSULIN ASPART 100 UNIT/ML ~~LOC~~ SOLN
0.0000 [IU] | Freq: Three times a day (TID) | SUBCUTANEOUS | Status: DC
  Administered 2015-01-09 – 2015-01-10 (×3): 2 [IU] via SUBCUTANEOUS
  Administered 2015-01-11 – 2015-01-12 (×2): 3 [IU] via SUBCUTANEOUS
  Administered 2015-01-12 – 2015-01-13 (×2): 2 [IU] via SUBCUTANEOUS

## 2015-01-09 MED ORDER — PANTOPRAZOLE SODIUM 40 MG IV SOLR
40.0000 mg | Freq: Every day | INTRAVENOUS | Status: DC
  Administered 2015-01-09 (×2): 40 mg via INTRAVENOUS
  Filled 2015-01-09 (×3): qty 40

## 2015-01-09 MED ORDER — BRINZOLAMIDE 1 % OP SUSP
1.0000 [drp] | Freq: Two times a day (BID) | OPHTHALMIC | Status: DC
  Administered 2015-01-09 – 2015-01-13 (×8): 1 [drp] via OPHTHALMIC
  Filled 2015-01-09: qty 10

## 2015-01-09 MED ORDER — IOHEXOL 350 MG/ML SOLN
80.0000 mL | Freq: Once | INTRAVENOUS | Status: AC | PRN
  Administered 2015-01-09: 80 mL via INTRAVENOUS

## 2015-01-09 MED ORDER — ONDANSETRON HCL 4 MG/2ML IJ SOLN
4.0000 mg | Freq: Three times a day (TID) | INTRAMUSCULAR | Status: DC | PRN
  Administered 2015-01-12: 4 mg via INTRAVENOUS
  Filled 2015-01-09: qty 2

## 2015-01-09 MED ORDER — ATORVASTATIN CALCIUM 10 MG PO TABS
20.0000 mg | ORAL_TABLET | Freq: Every day | ORAL | Status: DC
  Administered 2015-01-09 – 2015-01-12 (×4): 20 mg via ORAL
  Filled 2015-01-09 (×2): qty 1
  Filled 2015-01-09 (×5): qty 2

## 2015-01-09 MED ORDER — PNEUMOCOCCAL VAC POLYVALENT 25 MCG/0.5ML IJ INJ
0.5000 mL | INJECTION | INTRAMUSCULAR | Status: AC
  Administered 2015-01-10: 0.5 mL via INTRAMUSCULAR
  Filled 2015-01-09: qty 0.5

## 2015-01-09 MED ORDER — STROKE: EARLY STAGES OF RECOVERY BOOK
Freq: Once | Status: AC
  Administered 2015-01-09: 01:00:00
  Filled 2015-01-09: qty 1

## 2015-01-09 MED ORDER — SODIUM CHLORIDE 0.9 % IV SOLN
INTRAVENOUS | Status: DC
Start: 2015-01-09 — End: 2015-01-13
  Administered 2015-01-09: 19:00:00 via INTRAVENOUS
  Administered 2015-01-11: 1000 mL via INTRAVENOUS
  Administered 2015-01-12: 16:00:00 via INTRAVENOUS

## 2015-01-09 MED ORDER — SENNOSIDES-DOCUSATE SODIUM 8.6-50 MG PO TABS
1.0000 | ORAL_TABLET | Freq: Every evening | ORAL | Status: DC | PRN
  Administered 2015-01-09: 1 via ORAL
  Filled 2015-01-09 (×3): qty 1

## 2015-01-09 NOTE — Progress Notes (Signed)
Patient arrived to 4N32 AAOx2. Vitals taken, tele placed, SCDS on, and questions answered. Repeat CT completed and MD paged for ASA. Will continue to monitor closely. Jaydon Soroka, [place] Scrape, RN

## 2015-01-09 NOTE — Progress Notes (Signed)
  Echocardiogram 2D Echocardiogram has been performed.  Tye Savoy 01/09/2015, 2:00 PM

## 2015-01-09 NOTE — Progress Notes (Signed)
STROKE TEAM PROGRESS NOTE   HISTORY Tamara Campbell is an 81 y.o. female with a history of HTN and diabetes who re[ports noncompliance with her antihypertensives who was at her baseline today. This evening she sat down to eat supper and noted no problems. When she attempted to get up to leave the table she was unable to support herself and fell. She noted left sided weakness. EMS was called at that time. Patient was hypertensive. En route was noted to have a starring spell when she gazed to the right and did not respond to questioning.  Patient has had a stroke in the past in 2005. Patient had some mild right sided weakness as residual from this per daughter.  At baseline patient lives with son. She ambulates with a [place]. She dresses herself and goes to the bathroom independently. She does not report incontinence but does wear Depends because she reports that she does not always make it to the bathroom. Her son does the cooking and cleaning.   Date last known well: Date: 01/08/2015 Time last known well: Time: 18:00 tPA Given: Yes  Modified Rankin: Rankin Score=3    SUBJECTIVE (INTERVAL HISTORY) No family is at the bedside.  Overall she feels her condition is gradually improving. She still has left upper extremity weakness, however there is no gaze preference, no neglect. Patient seemed to respond to TPA well. Patient had a pacemaker, not candidate for MRI. Will need pacemaker interrogation.   OBJECTIVE Temp:  [98.4 F (36.9 C)-99.4 F (37.4 C)] 98.6 F (37 C) (08/05 1149) Pulse Rate:  [37-128] 37 (08/05 1200) Cardiac Rhythm:  [-] Ventricular paced (08/05 1200) Resp:  [0-43] 28 (08/05 1200) BP: (138-173)/(77-118) 156/82 mmHg (08/05 1200) SpO2:  [90 %-100 %] 98 % (08/05 1200) Weight:  [58.06 kg (128 lb)-61 kg (134 lb 7.7 oz)] 61 kg (134 lb 7.7 oz) (08/05 0115)   Recent Labs Lab 01/09/15 0802 01/09/15 1148  GLUCAP 115* 140*    Recent Labs Lab 01/08/15 2131  01/08/15 2139 01/09/15 0800  NA 139 143 141  K 3.4* 3.4* 3.4*  CL 106 106 108  CO2 21*  --  22  GLUCOSE 169* 172* 130*  BUN 21* 24* 21*  CREATININE 1.05* 1.00 0.93  CALCIUM 9.4  --  9.2    Recent Labs Lab 01/08/15 2131  AST 28  ALT 20  ALKPHOS 90  BILITOT 0.7  PROT 7.0  ALBUMIN 3.8    Recent Labs Lab 01/08/15 2131 01/08/15 2139 01/09/15 0800  WBC 3.7*  --  3.7*  NEUTROABS 2.3  --   --   HGB 13.6 15.6* 13.4  HCT 41.7 46.0 40.7  MCV 84.4  --  84.8  PLT 201  --  195   No results for input(s): CKTOTAL, CKMB, CKMBINDEX, TROPONINI in the last 168 hours.  Recent Labs  01/08/15 2131  LABPROT 14.6  INR 1.12   No results for input(s): COLORURINE, LABSPEC, PHURINE, GLUCOSEU, HGBUR, BILIRUBINUR, KETONESUR, PROTEINUR, UROBILINOGEN, NITRITE, LEUKOCYTESUR in the last 72 hours.  Invalid input(s): APPERANCEUR     Component Value Date/Time   CHOL 167 01/09/2015 0800   TRIG 47 01/09/2015 0800   HDL 58 01/09/2015 0800   CHOLHDL 2.9 01/09/2015 0800   VLDL 9 01/09/2015 0800   LDLCALC 100* 01/09/2015 0800   No results found for: HGBA1C No results found for: LABOPIA, COCAINSCRNUR, LABBENZ, AMPHETMU, Abner Greenspan   Recent Labs Lab 01/08/15 2131  ETH <5    Imaging  Ct  Head Wo Contrast 01/08/2015    No acute abnormality.  Atrophy and extensive chronic microvascular ischemic change.     CTA head and neck pending  2D echo pending  EKG - ? afib in the setting of paced rhythm - need confirmation from cardiology  PHYSICAL EXAM  Temp:  [98.4 F (36.9 C)-99.4 F (37.4 C)] 98.4 F (36.9 C) (08/05 1555) Pulse Rate:  [37-128] 100 (08/05 1500) Resp:  [0-43] 23 (08/05 1500) BP: (138-173)/(70-118) 155/89 mmHg (08/05 1500) SpO2:  [90 %-100 %] 99 % (08/05 1500) Weight:  [128 lb (58.06 kg)-134 lb 7.7 oz (61 kg)] 134 lb 7.7 oz (61 kg) (08/05 0115)  General - Well nourished, well developed, in no apparent distress.  Ophthalmologic - Fundi not visualized due to  noncooperation.  Cardiovascular - Regular rate and rhythm, paced.  Mental Status -  Level of arousal and orientation to time, place, and person were intact. Language including expression, naming, repetition, comprehension was assessed and found intact.  Cranial Nerves II - XII - II - Visual field intact OU. III, IV, VI - Extraocular movements intact. V - Facial sensation intact bilaterally. VII - Facial movement intact bilaterally. VIII - Hearing & vestibular intact bilaterally. X - Palate elevates symmetrically. XI - Chin turning & shoulder shrug intact bilaterally. XII - Tongue protrusion intact.  Motor Strength - The patient's strength was normal in all extremities except LUE 3/5 with pronator drift.  Bulk was normal and fasciculations were absent.   Motor Tone - Muscle tone was assessed at the neck and appendages and was normal.  Reflexes - The patient's reflexes were 1+ in all extremities and she had no pathological reflexes.  Sensory - Light touch, temperature/pinprick were assessed and were symmetrical.    Coordination - The patient had normal movements in the right hand and feet with no ataxia or dysmetria.  Tremor was absent.  Gait and Station - deferred due to within 24 hours of TPA.   ASSESSMENT/PLAN [name] is a 81 y.o. female with history of hypertension, diabetes mellitus, congestive heart failure, previous stroke, permanent pacemaker presenting with gazing to the right, unresponsiveness, and left hemiparesis. She received IV t-PA 52 mg on 01/08/2015 at 2215.  Stroke:  Likely right MCA infarct, probably embolic event due to unknown etiology - questionable for afib on EKG, but need cardiology to confirm.  Resultant  LUE weakness  CTA head and neck  pending  2D Echo pending  LDL 100  HgbA1c pending  SCDs for VTE prophylaxis Diet Carb Modified Fluid consistency:: Thin; Room service appropriate?: Yes  no antithrombotic prior to admission, now on no  antithrombotic secondary to within 24 hours of TPA  Patient counseled to be compliant with her antithrombotic medications  Ongoing aggressive stroke risk factor management  Therapy recommendations: Pending  Disposition:  Pending  ? afib   EKG EEG showed questionable A. fib in the setting of paced rhythm  Patient follow up with [facility] cardiology in 11/2014, did not mention history of A. Fib  Pacemaker for 3rd degree AV block  Needed cardiology confirmation of A. Fib EKG  Pacemaker interrogation from single chamber ventricular pacemaker did not report A. fib  Hypertension  Home meds: Amlodipine, atenolol, and hydrochlorothiazide  Mildly high blood pressures  Permissive hypertension <180/105 within 24 hours of TPA  Patient counseled to be compliant with her blood pressure medications  Hyperlipidemia  Home meds:  No lipid lowering medications prior to admission  LDL 100, goal <  70  Add Lipitor 20 mg daily  Continue statin at discharge  Diabetes  HgbA1c pending, goal < 7.0  Uncontrolled  Other Stroke Risk Factors  Advanced age  Former cigarette smoker, has quit smoking  Hx stroke/TIA in 2012  Other Active Problems  PPM - [employer] Jude - placed in [place]. Cardiology interrogated today.   Hypokalemia  Mildly elevated BUN  Chronic wound left lower extremity. Wound care nurse following.  Other Pertinent History    Hospital day # 1  This patient is critically ill due to CVA s/p tPA and AV block on pacer and at significant risk of neurological worsening, death form recurrent infarct, hemorrhagic transformation, cerebral edema and herniation, and heart failure. This patient's care requires constant monitoring of vital signs, hemodynamics, respiratory and cardiac monitoring, review of multiple databases, neurological assessment, discussion with family, other specialists and medical decision making of high complexity. I spent 40 minutes of neurocritical care time  in the care of this patient.  Marvel Plan, MD PhD Stroke Neurology 01/09/2015 4:34 PM    To contact Stroke Continuity provider, please refer to [email or web address]. After hours, contact General Neurology

## 2015-01-09 NOTE — Evaluation (Signed)
Speech Language Pathology Evaluation Patient Details Name: Tamara Campbell MRN: [id number] DOB: May 02, 1931 Today's Date: 01/09/2015 Time: 5284-1324 SLP Time Calculation (min) (ACUTE ONLY): 17 min  Problem List:  Patient Active Problem List   Diagnosis Date Noted  . Stroke 01/09/2015  . CVA (cerebral infarction) 01/08/2015   Past Medical History:  Past Medical History  Diagnosis Date  . Diabetes mellitus without complication   . Hypertension   . Stroke   . CHF (congestive heart failure)    Past Surgical History:  Past Surgical History  Procedure Laterality Date  . Joint replacement    . Abdominal hysterectomy    . Pacemaker placement     HPI:  81 y.o. female with a history of diabetes and hypertension who presents with new onset left hemiparesis and left neglect.  tPA administered.  Lives with son; ambulates with [place]; mild residual right hemiparesis s/p '05 CVA.    Assessment / Plan / Recommendation Clinical Impression  Pt presents with baseline deficits in short-term recall - she reports worsening since this CVA.  She has functional attention to task, normal pragmatics and speech/language function, and is fully oriented.  Recommend acute care SLP f/u for safety/cognition; post-D/C f/u TBD.      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[employer])   Cognition  Overall Cognitive Status: No family/caregiver present to determine baseline cognitive functioning Arousal/Alertness: Awake/alert Orientation Level:  Oriented X4 Attention: Selective Selective Attention: Appears intact Memory: Impaired Memory Impairment: Retrieval deficit;Decreased short term memory Decreased Short Term Memory: Verbal basic (recalls events from the day) Awareness: Appears intact    Comprehension  Auditory Comprehension Overall Auditory Comprehension: Appears within functional limits for tasks assessed Visual Recognition/Discrimination Discrimination: Within Function Limits Reading Comprehension Reading Status: Within funtional limits    Expression Expression Primary Mode of Expression: Verbal Verbal Expression Overall Verbal Expression: Appears within functional limits for tasks assessed Written Expression Dominant Hand: Right Written Expression: Not tested   Oral / Motor Oral Motor/Sensory Function Overall Oral Motor/Sensory Function:  (mild left CN VII asymmetry) Motor Speech Overall Motor Speech: Appears within functional limits for tasks assessed   Shaquella Stamant L. Samson Frederic, [place] CCC/SLP Pager [phone]      Blenda Mounts Laurice 01/09/2015, 12:14 PM

## 2015-01-09 NOTE — Consult Note (Addendum)
WOC wound consult note Reason for Consult: Consult requested for bilat legs.  Pt states she has a chronic wound to left leg. Wound type: Left leg with full thickness stasis ulcer, .2X.2X.2cm, yellow wound bed, small amt yellow drainage.  No odor, bilat legs have generalized edema and erythremia. Right leg with 3X3cm previous blister which has ruptured and is draining small amt yellow drainage, no odor.  Skin approximated over previous blister. Dressing procedure/placement/frequency: Foam dressing to right leg to protect and promote healing.  Neosporin to left leg to promote moist healing with foam cover dressing.  Discussed plan of care with patient and she verbalized understanding. Please re-consult if further assistance is needed.  Thank-you,  Cammie Mcgee MSN, RN, CWOCN, [place], CNS [phone]

## 2015-01-09 NOTE — Progress Notes (Signed)
Throughout the day shift, pt had been alert & oriented. At 16:50 pt was oriented x 4. At 17:25 pt became confused to place, age (but knew birthday), and situation. During this time, pt did not have any changes with gaze, vision, drifts, etc (full NIH in chart). Pt knew she was confused and was reoriented by RN. Pt then was still confused, so MD was made aware. MD stated to continue with plans to get 24 head CT but no new interventions needed at this time. MD also stated pt is ok to transfer to floor after CT complete. Pt remained confused but pleasant. Night RN updated on plan of care.

## 2015-01-10 ENCOUNTER — Encounter (HOSPITAL_COMMUNITY): Payer: Self-pay | Admitting: Cardiology

## 2015-01-10 DIAGNOSIS — I429 Cardiomyopathy, unspecified: Secondary | ICD-10-CM | POA: Diagnosis not present

## 2015-01-10 DIAGNOSIS — I639 Cerebral infarction, unspecified: Secondary | ICD-10-CM

## 2015-01-10 DIAGNOSIS — G8194 Hemiplegia, unspecified affecting left nondominant side: Secondary | ICD-10-CM | POA: Diagnosis not present

## 2015-01-10 DIAGNOSIS — I63411 Cerebral infarction due to embolism of right middle cerebral artery: Secondary | ICD-10-CM | POA: Diagnosis not present

## 2015-01-10 DIAGNOSIS — I10 Essential (primary) hypertension: Secondary | ICD-10-CM | POA: Diagnosis not present

## 2015-01-10 DIAGNOSIS — I69351 Hemiplegia and hemiparesis following cerebral infarction affecting right dominant side: Secondary | ICD-10-CM | POA: Diagnosis not present

## 2015-01-10 DIAGNOSIS — I4891 Unspecified atrial fibrillation: Secondary | ICD-10-CM | POA: Diagnosis not present

## 2015-01-10 DIAGNOSIS — E785 Hyperlipidemia, unspecified: Secondary | ICD-10-CM | POA: Diagnosis not present

## 2015-01-10 DIAGNOSIS — I638 Other cerebral infarction: Secondary | ICD-10-CM

## 2015-01-10 DIAGNOSIS — R414 Neurologic neglect syndrome: Secondary | ICD-10-CM | POA: Diagnosis not present

## 2015-01-10 DIAGNOSIS — I480 Paroxysmal atrial fibrillation: Secondary | ICD-10-CM | POA: Diagnosis present

## 2015-01-10 DIAGNOSIS — E119 Type 2 diabetes mellitus without complications: Secondary | ICD-10-CM | POA: Diagnosis not present

## 2015-01-10 LAB — GLUCOSE, CAPILLARY
GLUCOSE-CAPILLARY: 86 mg/dL (ref 65–99)
GLUCOSE-CAPILLARY: 150 mg/dL — AB (ref 65–99)
GLUCOSE-CAPILLARY: 72 mg/dL (ref 65–99)
GLUCOSE-CAPILLARY: 119 mg/dL — AB (ref 65–99)
Glucose-Capillary: 138 mg/dL — ABNORMAL HIGH (ref 65–99)

## 2015-01-10 LAB — BASIC METABOLIC PANEL
ANION GAP: 10 (ref 5–15)
BUN: 17 mg/dL (ref 6–20)
CHLORIDE: 109 mmol/L (ref 101–111)
CO2: 22 mmol/L (ref 22–32)
Calcium: 8.8 mg/dL — ABNORMAL LOW (ref 8.9–10.3)
Creatinine, Ser: 1.01 mg/dL — ABNORMAL HIGH (ref 0.44–1.00)
GFR calc non Af Amer: 51 mL/min — ABNORMAL LOW (ref >60)
GFR, EST AFRICAN AMERICAN: 59 mL/min — AB (ref >60)
GLUCOSE: 90 mg/dL (ref 65–99)
Potassium: 3.2 mmol/L — ABNORMAL LOW (ref 3.5–5.1)
SODIUM: 141 mmol/L (ref 135–145)

## 2015-01-10 LAB — CBC
HEMATOCRIT: 41.9 % (ref 36.0–46.0)
Hemoglobin: 13.7 g/dL (ref 12.0–15.0)
MCH: 27.2 pg (ref 26.0–34.0)
MCHC: 32.7 g/dL (ref 30.0–36.0)
MCV: 83.3 fL (ref 78.0–100.0)
Platelets: 216 10*3/uL (ref 150–400)
RBC: 5.03 MIL/uL (ref 3.87–5.11)
RDW: 15.5 % (ref 11.5–15.5)
WBC: 4.3 10*3/uL (ref 4.0–10.5)

## 2015-01-10 LAB — HEMOGLOBIN A1C
HEMOGLOBIN A1C: 6.3 % — AB (ref 4.8–5.6)
MEAN PLASMA GLUCOSE: 134 mg/dL

## 2015-01-10 MED ORDER — PANTOPRAZOLE SODIUM 40 MG PO TBEC
40.0000 mg | DELAYED_RELEASE_TABLET | Freq: Every day | ORAL | Status: DC
  Administered 2015-01-10 – 2015-01-12 (×3): 40 mg via ORAL
  Filled 2015-01-10 (×3): qty 1

## 2015-01-10 MED ORDER — POTASSIUM CHLORIDE CRYS ER 20 MEQ PO TBCR
20.0000 meq | EXTENDED_RELEASE_TABLET | Freq: Two times a day (BID) | ORAL | Status: AC
  Administered 2015-01-10 – 2015-01-12 (×6): 20 meq via ORAL
  Filled 2015-01-10 (×6): qty 1

## 2015-01-10 MED ORDER — LISINOPRIL 5 MG PO TABS
5.0000 mg | ORAL_TABLET | Freq: Every day | ORAL | Status: DC
  Administered 2015-01-10 – 2015-01-13 (×4): 5 mg via ORAL
  Filled 2015-01-10 (×4): qty 1

## 2015-01-10 MED ORDER — CARVEDILOL 6.25 MG PO TABS
6.2500 mg | ORAL_TABLET | Freq: Two times a day (BID) | ORAL | Status: DC
  Administered 2015-01-10 – 2015-01-13 (×6): 6.25 mg via ORAL
  Filled 2015-01-10 (×6): qty 1

## 2015-01-10 NOTE — Evaluation (Signed)
Physical Therapy Evaluation Patient Details Name: Tamara Campbell MRN: [id number] DOB: 05/13/1931 Today's Date: 01/10/2015   History of Present Illness  81 y.o. female admitted for Stroke and afib.  Clinical Impression  Pt admitted with the above diagnosis. Pt currently with functional limitations due to the deficits listed below (see PT Problem List). Previously community dwelling and independent with an assistive device. Currently requires physical assist for mobility including transfers and ambulation, demonstrating loss of balance posteriorly. Feel she would benefit from CIR to improve her functional independence and safety prior to returning home. States her son is retired from [employer], lives with her, and can provide 24 hour care as needed. Pt will benefit from skilled PT to increase their independence and safety with mobility to allow discharge to the venue listed below.       Follow Up Recommendations CIR    Equipment Recommendations  None recommended by PT    Recommendations for Other Services Rehab consult;OT consult     Precautions / Restrictions Precautions Precautions: Fall Restrictions Weight Bearing Restrictions: No      Mobility  Bed Mobility Overal bed mobility: Needs Assistance Bed Mobility: Supine to Sit     Supine to sit: Min assist;HOB elevated     General bed mobility comments: Min assist for trunk support. Leans posteriorly. Extra time  Transfers Overall transfer level: Needs assistance Equipment used: Rolling Mcminn (2 wheeled) Transfers: Sit to/from Stand Sit to Stand: Min assist;+2 safety/equipment         General transfer comment: Min assist for boost to stand with heavy lean to posterior initially. Required several minutes of standing to find balance with tactile cues for anterior weight shift and Ues of UEs to push down through RW.  Ambulation/Gait Ambulation/Gait assistance: Min assist;+2 safety/equipment Ambulation Distance (Feet): 15  Feet Assistive device: Rolling Gaumer (2 wheeled) Gait Pattern/deviations: Step-through pattern;Decreased stride length;Leaning posteriorly;Narrow base of support Gait velocity: slow Gait velocity interpretation: Below normal speed for age/gender General Gait Details: Min assist for balance and to initiate continuous stepping. Required assist to grip RW with LUE. VC for larger steps, and upright posture. Leans to posterior and needs assistance to manage RW.  Stairs            Wheelchair Mobility    Modified Rankin (Stroke Patients Only) Modified Rankin (Stroke Patients Only) Pre-Morbid Rankin Score: Slight disability (Still drives performs ADLs, uses cane/RW) Modified Rankin: Moderately severe disability     Balance Overall balance assessment: Needs assistance;History of Falls Sitting-balance support: No upper extremity supported;Feet supported Sitting balance-Leahy Scale: Fair   Postural control: Posterior lean Standing balance support: Single extremity supported Standing balance-Leahy Scale: Poor                               Pertinent Vitals/Pain Pain Assessment: No/denies pain    Home Living Family/patient expects to be discharged to:: Private residence Living Arrangements: Children (son) Available Help at Discharge: Family;Available 24 hours/day Type of Home: House Home Access: Ramped entrance     Home Layout: One level Home Equipment: Sauser - 2 wheels;Cane - single point;Shower seat - built in      Prior Function Level of Independence: Independent with assistive device(s)         Comments: Driving PTA. Uses cane and RW.     Hand Dominance   Dominant Hand: Right    Extremity/Trunk Assessment   Upper Extremity Assessment: Defer to OT evaluation (LUE  weakness)           Lower Extremity Assessment: Generalized weakness         Communication   Communication: No difficulties  Cognition Arousal/Alertness: Awake/alert Behavior  During Therapy: WFL for tasks assessed/performed Overall Cognitive Status: No family/caregiver present to determine baseline cognitive functioning                      General Comments General comments (skin integrity, edema, etc.): Encouraged to use LUE for activites as much as able    Exercises General Exercises - Lower Extremity Ankle Circles/Pumps: AROM;Both;10 reps;Seated Quad Sets: Strengthening;Both;10 reps;Seated      Assessment/Plan    PT Assessment Patient needs continued PT services  PT Diagnosis Abnormality of gait;Difficulty walking;Hemiplegia non-dominant side   PT Problem List Decreased strength;Decreased range of motion;Decreased activity tolerance;Decreased balance;Decreased mobility;Decreased coordination;Decreased knowledge of use of DME  PT Treatment Interventions DME instruction;Gait training;Functional mobility training;Therapeutic activities;Balance training;Therapeutic exercise;Neuromuscular re-education;Patient/family education   PT Goals (Current goals can be found in the Care Plan section) Acute Rehab PT Goals Patient Stated Goal: Get well again PT Goal Formulation: With patient Time For Goal Achievement: 01/24/15 Potential to Achieve Goals: Good    Frequency Min 4X/week   Barriers to discharge        Co-evaluation               End of Session Equipment Utilized During Treatment: Gait belt Activity Tolerance: Patient tolerated treatment well Patient left: in chair;with call bell/phone within reach;with chair alarm set Nurse Communication: Mobility status         Time: 1610-9604 PT Time Calculation (min) (ACUTE ONLY): 30 min   Charges:   PT Evaluation $Initial PT Evaluation Tier I: 1 Procedure PT Treatments $Therapeutic Activity: 8-22 mins   PT G CodesBerton Mount 01/10/2015, 5:20 PM [place], [place] [phone]

## 2015-01-10 NOTE — Care Management Note (Signed)
Case Management Note  Patient Details  Name: Tamara Campbell MRN: [id number] Date of Birth: 01-12-1931  Subjective/Objective:                    Action/Plan:   Expected Discharge Date:                  Expected Discharge Plan:     In-House Referral:     Discharge planning Services     Post Acute Care Choice:    Choice offered to:     DME Arranged:    DME Agency:     HH Arranged:    HH Agency:     Status of Service:     Medicare Important Message Given: yes   Date Medicare IM Given: 01/10/15   Medicare IM give by: Meryl Crutch, RN, BSN   Date Additional Medicare IM Given:    Additional Medicare Important Message give by:     If discussed at Long Length of Stay Meetings, dates discussed:    Additional Comments:  Isaias Cowman, RN 01/10/2015, 11:32 AM

## 2015-01-10 NOTE — Consult Note (Signed)
Reason for Consult:  Atrial fib with hx PPM followed at [place]  Referring Physician: Dr. Erlinda Hong   PCP:  No primary care provider on file.  Primary Cardiologist:Dr. Casper Harrison at [facility] is an 81 y.o. female.    Chief Complaint: admitted  01/08/15 with CVA  HPI: 16 yoF followed at [facility] for Heart block requiring single lead PPM.  In 2012 she had normal EF and had been doing well.  On admit pt was in a fib and remains in a fib.  This began November 23, 2014 and was intermittent then persistent.   She did receive tPa for CVA.    Pacer has been interrogated and pt with High rates as stated November 23, 2014.  Dr. Radford Pax has reviewed and this is a fib.  Since admit also found to have low EF 30-35% down from normal in 2012.  No chest pain or SOB.    Past Medical History  Diagnosis Date  . Diabetes mellitus without complication   . Hypertension   . Stroke   . CHF (congestive heart failure)     Past Surgical History  Procedure Laterality Date  . Joint replacement    . Abdominal hysterectomy    . Pacemaker placement      History reviewed. No pertinent family history. Social History:  reports that she has quit smoking. She has never used smokeless tobacco. She reports that she does not drink alcohol or use illicit drugs.  Allergies: No Known Allergies  @medshecduled @ @medinfusions @ acetaminophen **OR** [DISCONTINUED] acetaminophen, labetalol, ondansetron (ZOFRAN) IV, senna-docusate  Results for orders placed or performed during the hospital encounter of 01/08/15 (from the past 48 hour(s))  Ethanol     Status: None   Collection Time: 01/08/15  9:31 PM  Result Value Ref Range   Alcohol, Ethyl (B) <5 <5 mg/dL    Comment:        LOWEST DETECTABLE LIMIT FOR SERUM ALCOHOL IS 5 mg/dL FOR MEDICAL PURPOSES ONLY   Protime-INR     Status: None   Collection Time: 01/08/15  9:31 PM  Result Value Ref Range   Prothrombin Time 14.6 11.6 - 15.2 seconds   INR 1.12 0.00 - 1.49    APTT     Status: None   Collection Time: 01/08/15  9:31 PM  Result Value Ref Range   aPTT 27 24 - 37 seconds  CBC     Status: Abnormal   Collection Time: 01/08/15  9:31 PM  Result Value Ref Range   WBC 3.7 (L) 4.0 - 10.5 K/uL   RBC 4.94 3.87 - 5.11 MIL/uL   Hemoglobin 13.6 12.0 - 15.0 g/dL   HCT 41.7 36.0 - 46.0 %   MCV 84.4 78.0 - 100.0 fL   MCH 27.5 26.0 - 34.0 pg   MCHC 32.6 30.0 - 36.0 g/dL   RDW 15.4 11.5 - 15.5 %   Platelets 201 150 - 400 K/uL  Differential     Status: None   Collection Time: 01/08/15  9:31 PM  Result Value Ref Range   Neutrophils Relative % 62 43 - 77 %   Neutro Abs 2.3 1.7 - 7.7 K/uL   Lymphocytes Relative 29 12 - 46 %   Lymphs Abs 1.1 0.7 - 4.0 K/uL   Monocytes Relative 7 3 - 12 %   Monocytes Absolute 0.2 0.1 - 1.0 K/uL   Eosinophils Relative 2 0 - 5 %   Eosinophils Absolute  0.1 0.0 - 0.7 K/uL   Basophils Relative 0 0 - 1 %   Basophils Absolute 0.0 0.0 - 0.1 K/uL  Comprehensive metabolic panel     Status: Abnormal   Collection Time: 01/08/15  9:31 PM  Result Value Ref Range   Sodium 139 135 - 145 mmol/L   Potassium 3.4 (L) 3.5 - 5.1 mmol/L   Chloride 106 101 - 111 mmol/L   CO2 21 (L) 22 - 32 mmol/L   Glucose, Bld 169 (H) 65 - 99 mg/dL   BUN 21 (H) 6 - 20 mg/dL   Creatinine, Ser 1.05 (H) 0.44 - 1.00 mg/dL   Calcium 9.4 8.9 - 10.3 mg/dL   Total Protein 7.0 6.5 - 8.1 g/dL   Albumin 3.8 3.5 - 5.0 g/dL   AST 28 15 - 41 U/L   ALT 20 14 - 54 U/L   Alkaline Phosphatase 90 38 - 126 U/L   Total Bilirubin 0.7 0.3 - 1.2 mg/dL   GFR calc non Af Amer 48 (L) >60 mL/min   GFR calc Af Amer 56 (L) >60 mL/min    Comment: (NOTE) The eGFR has been calculated using the CKD EPI equation. This calculation has not been validated in all clinical situations. eGFR's persistently <60 mL/min signify possible Chronic Kidney Disease.    Anion gap 12 5 - 15  I-stat troponin, ED (not at [facility], [facility])     Status: None   Collection Time: 01/08/15  9:36 PM  Result Value  Ref Range   Troponin i, poc 0.01 0.00 - 0.08 ng/mL   Comment 3            Comment: Due to the release kinetics of cTnI, a negative result within the first hours of the onset of symptoms does not rule out myocardial infarction with certainty. If myocardial infarction is still suspected, repeat the test at appropriate intervals.   I-Stat Chem 8, ED  (not at [facility], [facility])     Status: Abnormal   Collection Time: 01/08/15  9:39 PM  Result Value Ref Range   Sodium 143 135 - 145 mmol/L   Potassium 3.4 (L) 3.5 - 5.1 mmol/L   Chloride 106 101 - 111 mmol/L   BUN 24 (H) 6 - 20 mg/dL   Creatinine, Ser 1.00 0.44 - 1.00 mg/dL   Glucose, Bld 172 (H) 65 - 99 mg/dL   Calcium, Ion 1.09 (L) 1.13 - 1.30 mmol/L   TCO2 20 0 - 100 mmol/L   Hemoglobin 15.6 (H) 12.0 - 15.0 g/dL   HCT 46.0 36.0 - 46.0 %  MRSA PCR Screening     Status: None   Collection Time: 01/08/15 11:27 PM  Result Value Ref Range   MRSA by PCR NEGATIVE NEGATIVE    Comment:        The GeneXpert MRSA Assay (FDA approved for NASAL specimens only), is one component of a comprehensive MRSA colonization surveillance program. It is not intended to diagnose MRSA infection nor to guide or monitor treatment for MRSA infections.   Hemoglobin A1c     Status: Abnormal   Collection Time: 01/09/15  8:00 AM  Result Value Ref Range   Hgb A1c MFr Bld 6.3 (H) 4.8 - 5.6 %    Comment: (NOTE)         Pre-diabetes: 5.7 - 6.4         Diabetes: >6.4         Glycemic control for adults with diabetes: <7.0  Mean Plasma Glucose 134 mg/dL    Comment: (NOTE) Performed At: [facility] [place], [place] [id number] Lindon Romp MD IO:[id number]   Lipid panel     Status: Abnormal   Collection Time: 01/09/15  8:00 AM  Result Value Ref Range   Cholesterol 167 0 - 200 mg/dL   Triglycerides 47 <150 mg/dL   HDL 58 >40 mg/dL   Total CHOL/HDL Ratio 2.9 RATIO   VLDL 9 0 - 40 mg/dL   LDL Cholesterol 100 (H) 0 - 99 mg/dL     Comment:        Total Cholesterol/HDL:CHD Risk Coronary Heart Disease Risk Table                     Men   Women  1/2 Average Risk   3.4   3.3  Average Risk       5.0   4.4  2 X Average Risk   9.6   7.1  3 X Average Risk  23.4   11.0        Use the calculated Patient Ratio above and the CHD Risk Table to determine the patient's CHD Risk.        ATP III CLASSIFICATION (LDL):  <100     mg/dL   Optimal  100-129  mg/dL   Near or Above                    Optimal  130-159  mg/dL   Borderline  160-189  mg/dL   High  >190     mg/dL   Very High   CBC     Status: Abnormal   Collection Time: 01/09/15  8:00 AM  Result Value Ref Range   WBC 3.7 (L) 4.0 - 10.5 K/uL   RBC 4.80 3.87 - 5.11 MIL/uL   Hemoglobin 13.4 12.0 - 15.0 g/dL   HCT 40.7 36.0 - 46.0 %   MCV 84.8 78.0 - 100.0 fL   MCH 27.9 26.0 - 34.0 pg   MCHC 32.9 30.0 - 36.0 g/dL   RDW 15.4 11.5 - 15.5 %   Platelets 195 150 - 400 K/uL  Basic metabolic panel     Status: Abnormal   Collection Time: 01/09/15  8:00 AM  Result Value Ref Range   Sodium 141 135 - 145 mmol/L   Potassium 3.4 (L) 3.5 - 5.1 mmol/L   Chloride 108 101 - 111 mmol/L   CO2 22 22 - 32 mmol/L   Glucose, Bld 130 (H) 65 - 99 mg/dL   BUN 21 (H) 6 - 20 mg/dL   Creatinine, Ser 0.93 0.44 - 1.00 mg/dL   Calcium 9.2 8.9 - 10.3 mg/dL   GFR calc non Af Amer 56 (L) >60 mL/min   GFR calc Af Amer >60 >60 mL/min    Comment: (NOTE) The eGFR has been calculated using the CKD EPI equation. This calculation has not been validated in all clinical situations. eGFR's persistently <60 mL/min signify possible Chronic Kidney Disease.    Anion gap 11 5 - 15  Glucose, capillary     Status: Abnormal   Collection Time: 01/09/15  8:02 AM  Result Value Ref Range   Glucose-Capillary 115 (H) 65 - 99 mg/dL   Comment 1 Notify RN    Comment 2 Document in Chart   Glucose, capillary     Status: Abnormal   Collection Time: 01/09/15 11:48 AM  Result Value Ref Range  Glucose-Capillary  140 (H) 65 - 99 mg/dL   Comment 1 Notify RN    Comment 2 Document in Chart   Glucose, capillary     Status: Abnormal   Collection Time: 01/09/15  3:54 PM  Result Value Ref Range   Glucose-Capillary 138 (H) 65 - 99 mg/dL  Glucose, capillary     Status: Abnormal   Collection Time: 01/09/15  6:16 PM  Result Value Ref Range   Glucose-Capillary 142 (H) 65 - 99 mg/dL  Glucose, capillary     Status: Abnormal   Collection Time: 01/09/15  9:55 PM  Result Value Ref Range   Glucose-Capillary 109 (H) 65 - 99 mg/dL   Comment 1 Notify RN    Comment 2 Document in Chart   Basic metabolic panel     Status: Abnormal   Collection Time: 01/10/15  5:05 AM  Result Value Ref Range   Sodium 141 135 - 145 mmol/L   Potassium 3.2 (L) 3.5 - 5.1 mmol/L   Chloride 109 101 - 111 mmol/L   CO2 22 22 - 32 mmol/L   Glucose, Bld 90 65 - 99 mg/dL   BUN 17 6 - 20 mg/dL   Creatinine, Ser 1.01 (H) 0.44 - 1.00 mg/dL   Calcium 8.8 (L) 8.9 - 10.3 mg/dL   GFR calc non Af Amer 51 (L) >60 mL/min   GFR calc Af Amer 59 (L) >60 mL/min    Comment: (NOTE) The eGFR has been calculated using the CKD EPI equation. This calculation has not been validated in all clinical situations. eGFR's persistently <60 mL/min signify possible Chronic Kidney Disease.    Anion gap 10 5 - 15  Glucose, capillary     Status: None   Collection Time: 01/10/15  6:28 AM  Result Value Ref Range   Glucose-Capillary 86 65 - 99 mg/dL   Comment 1 Notify RN    Comment 2 Document in Chart   Glucose, capillary     Status: Abnormal   Collection Time: 01/10/15 11:44 AM  Result Value Ref Range   Glucose-Capillary 150 (H) 65 - 99 mg/dL  CBC     Status: None   Collection Time: 01/10/15 12:15 PM  Result Value Ref Range   WBC 4.3 4.0 - 10.5 K/uL   RBC 5.03 3.87 - 5.11 MIL/uL   Hemoglobin 13.7 12.0 - 15.0 g/dL   HCT 41.9 36.0 - 46.0 %   MCV 83.3 78.0 - 100.0 fL   MCH 27.2 26.0 - 34.0 pg   MCHC 32.7 30.0 - 36.0 g/dL   RDW 15.5 11.5 - 15.5 %   Platelets  216 150 - 400 K/uL   Ct Angio Head W/cm &/or Wo Cm  01/09/2015   CLINICAL DATA:  81 y.o. female with history of hypertension, diabetes mellitus, congestive heart failure, previous stroke, permanent pacemaker presenting with gazing to the right, unresponsiveness, and left hemiparesis. She received IV t-PA 52 mg on 01/08/2015 at 2215.  EXAM: CT ANGIOGRAPHY HEAD AND NECK  TECHNIQUE: Multidetector CT imaging of the head and neck was performed using the standard protocol during bolus administration of intravenous contrast. Multiplanar CT image reconstructions and MIPs were obtained to evaluate the vascular anatomy. Carotid stenosis measurements (when applicable) are obtained utilizing NASCET criteria, using the distal internal carotid diameter as the denominator.  CONTRAST:  71m OMNIPAQUE IOHEXOL 350 MG/ML SOLN  COMPARISON:  CT head performed 01/08/2015. MR head in the remote past 01/14/2004.  FINDINGS: CT HEAD  Calvarium and skull base:  No fracture or destructive lesion. Mastoids and middle ears are grossly clear.  Paranasal sinuses: Imaged portions are clear.  Orbits: Negative.  Brain: Developing cytotoxic edema in the RIGHT posterior frontal precentral gyrus seen best on postcontrast images 21 and 22 series 13 consistent with a small distal embolic infarction in this patient with atrial fibrillation. Atrophy consistent with patient's advanced age. Extensive small vessel disease.  CTA NECK  Aortic arch: Standard branching. Unusual swirling pattern of contrast in the transverse arch and proximal innominate without evidence for dissection. Some of these areas appear linear although intimal disruption seems unlikely. I favor this abnormality representing swirling blood, perhaps accentuated due to the patient's atrial fibrillation.  Right carotid system: Heavily calcified plaque at the bifurcation. No evidence of dissection, stenosis (50% or greater) or occlusion.  Left carotid system: Heavily calcified plaque at the  bifurcation. No evidence of dissection, stenosis (50% or greater) or occlusion.  Vertebral arteries: Codominant. No evidence of dissection, stenosis (50% or greater) or occlusion.  Soft tissues: Spondylosis. Thyroid gland enlargement with heterogeneous enhancement, and slight RIGHT-to-LEFT tracheal displacement consistent with goiter. Slight substernal extension. Pacemaker. BILATERAL effusions and vascular congestion consistent with CHF, RIGHT greater than LEFT. No worrisome osseous lesions. Airway midline.  CTA HEAD  Anterior circulation: Non stenotic calcified plaque in the carotid siphons. No flow reducing lesion of the A1 or M1 segments. No significant stenosis, proximal occlusion, aneurysm, or vascular malformation. No observable RIGHT MCA branch occlusion.  Posterior circulation: Small basilar artery in part due to fetal PCA origins, but superimposed diffuse disease with a focal mid basilar stenosis 50-75%. Distal RIGHT vertebral stenosis, V4 segment, 50-75%. No proximal occlusion, aneurysm, or vascular malformation.  Venous sinuses: As permitted by contrast timing, patent.  Anatomic variants: BILATERAL fetal PCA origins.  Delayed phase:   No abnormal intracranial enhancement.  IMPRESSION: Developing cytotoxic edema and the RIGHT posterior frontal precentral gyrus consistent with a small distal embolic infarction.  Intracranial atherosclerotic change affecting predominantly the posterior circulation with distal RIGHT vertebral and mid basilar stenoses potentially flow reducing.  No large vessel anterior circulation occlusion or observable proximal MCA branch occlusion.  Unusual swirling appearance of contrast in the transverse arch and proximal anomaly, without signs of dissection. Favor artifact due to mixing. If further investigation is desired however, consider CTA chest using dissection protocol.   Electronically Signed   By: Staci Righter M.D.   On: 01/09/2015 21:02   Ct Head Wo Contrast  01/08/2015    CLINICAL DATA:  Left arm weakness.  EXAM: CT HEAD WITHOUT CONTRAST  TECHNIQUE: Contiguous axial images were obtained from the base of the skull through the vertex without intravenous contrast.  COMPARISON:  Head CT scan 01/14/2004.  FINDINGS: There is atrophy and extensive chronic microvascular ischemic change. No acute intracranial abnormality including hemorrhage, infarct, mass lesion, mass effect, midline shift or abnormal extra-axial fluid collection is identified. No hydrocephalus or pneumocephalus. The calvarium is intact.  IMPRESSION: No acute abnormality.  Atrophy and extensive chronic microvascular ischemic change.   Electronically Signed   By: Inge Rise M.D.   On: 01/08/2015 21:43   Ct Angio Neck W/cm &/or Wo/cm  01/09/2015   CLINICAL DATA:  81 y.o. female with history of hypertension, diabetes mellitus, congestive heart failure, previous stroke, permanent pacemaker presenting with gazing to the right, unresponsiveness, and left hemiparesis. She received IV t-PA 52 mg on 01/08/2015 at 2215.  EXAM: CT ANGIOGRAPHY HEAD AND NECK  TECHNIQUE: Multidetector CT imaging of the head and  neck was performed using the standard protocol during bolus administration of intravenous contrast. Multiplanar CT image reconstructions and MIPs were obtained to evaluate the vascular anatomy. Carotid stenosis measurements (when applicable) are obtained utilizing NASCET criteria, using the distal internal carotid diameter as the denominator.  CONTRAST:  65m OMNIPAQUE IOHEXOL 350 MG/ML SOLN  COMPARISON:  CT head performed 01/08/2015. MR head in the remote past 01/14/2004.  FINDINGS: CT HEAD  Calvarium and skull base: No fracture or destructive lesion. Mastoids and middle ears are grossly clear.  Paranasal sinuses: Imaged portions are clear.  Orbits: Negative.  Brain: Developing cytotoxic edema in the RIGHT posterior frontal precentral gyrus seen best on postcontrast images 21 and 22 series 13 consistent with a small distal  embolic infarction in this patient with atrial fibrillation. Atrophy consistent with patient's advanced age. Extensive small vessel disease.  CTA NECK  Aortic arch: Standard branching. Unusual swirling pattern of contrast in the transverse arch and proximal innominate without evidence for dissection. Some of these areas appear linear although intimal disruption seems unlikely. I favor this abnormality representing swirling blood, perhaps accentuated due to the patient's atrial fibrillation.  Right carotid system: Heavily calcified plaque at the bifurcation. No evidence of dissection, stenosis (50% or greater) or occlusion.  Left carotid system: Heavily calcified plaque at the bifurcation. No evidence of dissection, stenosis (50% or greater) or occlusion.  Vertebral arteries: Codominant. No evidence of dissection, stenosis (50% or greater) or occlusion.  Soft tissues: Spondylosis. Thyroid gland enlargement with heterogeneous enhancement, and slight RIGHT-to-LEFT tracheal displacement consistent with goiter. Slight substernal extension. Pacemaker. BILATERAL effusions and vascular congestion consistent with CHF, RIGHT greater than LEFT. No worrisome osseous lesions. Airway midline.  CTA HEAD  Anterior circulation: Non stenotic calcified plaque in the carotid siphons. No flow reducing lesion of the A1 or M1 segments. No significant stenosis, proximal occlusion, aneurysm, or vascular malformation. No observable RIGHT MCA branch occlusion.  Posterior circulation: Small basilar artery in part due to fetal PCA origins, but superimposed diffuse disease with a focal mid basilar stenosis 50-75%. Distal RIGHT vertebral stenosis, V4 segment, 50-75%. No proximal occlusion, aneurysm, or vascular malformation.  Venous sinuses: As permitted by contrast timing, patent.  Anatomic variants: BILATERAL fetal PCA origins.  Delayed phase:   No abnormal intracranial enhancement.  IMPRESSION: Developing cytotoxic edema and the RIGHT  posterior frontal precentral gyrus consistent with a small distal embolic infarction.  Intracranial atherosclerotic change affecting predominantly the posterior circulation with distal RIGHT vertebral and mid basilar stenoses potentially flow reducing.  No large vessel anterior circulation occlusion or observable proximal MCA branch occlusion.  Unusual swirling appearance of contrast in the transverse arch and proximal anomaly, without signs of dissection. Favor artifact due to mixing. If further investigation is desired however, consider CTA chest using dissection protocol.   Electronically Signed   By: JStaci RighterM.D.   On: 01/09/2015 21:02   Dg Chest Port 1 View  01/09/2015   CLINICAL DATA:  81y.o. female with a history of HTN and diabetes who reports noncompliance with her antihypertensives who was at her baseline today. This evening she sat down to eat supper and noted no problems. When she attempted to get up to leave the table she was unable to support herself and fell. She noted left sided weakness. EMS was called at that time. Patient was hypertensive. En route was noted to have a starring spell when she gazed to the right and did not respond to questioning. History of stroke.  EXAM:  PORTABLE CHEST - 1 VIEW  COMPARISON:  None available.  FINDINGS: 1608 hours. Left subclavian pacemaker lead projects over the left upper quadrant of the abdomen. Intracardiac position cannot be confirmed based on this examination. The heart is markedly enlarged. There is diffuse dilatation and atherosclerosis of the aorta. There is mild atelectasis at both lung bases. No confluent airspace opacity, significant pleural effusion or pneumothorax identified. The bones appear unremarkable.  IMPRESSION: 1. Cardiomegaly and diffuse aortic ectasia. 2. The patient's pacemaker lead projects over the left upper quadrant of the abdomen. This may be projectional in this patient with cardiomegaly. However, there are no comparison  studies to assess stability. Chest CT should be considered for further evaluation. 3. These results will be called to the ordering clinician or representative by the Radiologist Assistant, and communication documented in the PACS or zVision Dashboard.   Electronically Signed   By: Richardean Sale M.D.   On: 01/09/2015 16:29    ROS: General:no colds or fevers, no weight changes Skin:no rashes or ulcers HEENT:no blurred vision, no congestion CV:see HPI PUL:see HPI GI:no diarrhea constipation or melena, no indigestion GU:no hematuria, no dysuria MS:no joint pain, no claudication Neuro:no syncope, no lightheadedness Endo:+ diabetes, no thyroid disease   Blood pressure 157/86, pulse 72, temperature 98.9 F (37.2 C), temperature source Oral, resp. rate 22, height 5' 3"  (1.6 m), weight 143 lb 1.6 oz (64.91 kg), SpO2 98 %.  Wt Readings from Last 3 Encounters:  01/09/15 143 lb 1.6 oz (64.91 kg)    PE: General:Pleasant affect, NAD Skin:Warm and dry, brisk capillary refill HEENT:normocephalic, sclera clear, mucus membranes moist Neck:supple, no JVD, no bruits  Heart:irreg irreg without murmur, gallup, rub or click Lungs:clear without rales, rhonchi, or wheezes POI:PPGF, non tender, + BS, do not palpate liver spleen or masses Ext:no lower ext edema, 2+ pedal pulses, 2+ radial pulses Neuro:alert and oriented, MAE, follows commands, + facial symmetry TELE:  A fib with pacing and aberency  Assessment/Plan Active Problems:   CVA (cerebral infarction)    Atrial fibrillation- new from June - needs anticoagulation will ask neuro to add. Rate is controlled. Will need follow up with cardiology at [facility]. We can determine a fib with PPM, does not need loop.      Cardiomyopathy- new from 2012, we changed BB to coreg and added ACE.  This may be from ventricular pacing.     [place] Practitioner Certified [place] Pager [phone] or after 5pm or weekends call  [phone] 01/10/2015, 1:01 PM

## 2015-01-10 NOTE — Progress Notes (Signed)
STROKE TEAM PROGRESS NOTE   HISTORY Tamara Campbell is an 81 y.o. female with a history of HTN and diabetes who re[ports noncompliance with her antihypertensives who was at her baseline today. This evening she sat down to eat supper and noted no problems. When she attempted to get up to leave the table she was unable to support herself and fell. She noted left sided weakness. EMS was called at that time. Patient was hypertensive. En route was noted to have a starring spell when she gazed to the right and did not respond to questioning.  Patient has had a stroke in the past in 2005. Patient had some mild right sided weakness as residual from this per daughter.  At baseline patient lives with son. She ambulates with a [place]. She dresses herself and goes to the bathroom independently. She does not report incontinence but does wear Depends because she reports that she does not always make it to the bathroom. Her son does the cooking and cleaning.   Date last known well: Date: 01/08/2015 Time last known well: Time: 18:00 tPA Given: Yes  Modified Rankin: Rankin Score=3    SUBJECTIVE (INTERVAL HISTORY) No family members present. The patient is alert and responds appropriately to questions. She continues to have left upper extremity weakness more distal than proximal. Awaiting cardiology evaluation.  OBJECTIVE Temp:  [97.7 F (36.5 C)-98.9 F (37.2 C)] 98.9 F (37.2 C) (08/06 0951) Pulse Rate:  [37-100] 72 (08/06 0951) Cardiac Rhythm:  [-] Ventricular paced (08/05 2057) Resp:  [13-34] 22 (08/06 0951) BP: (142-170)/(61-110) 157/86 mmHg (08/06 0951) SpO2:  [94 %-100 %] 98 % (08/06 0951) Weight:  [64.91 kg (143 lb 1.6 oz)] 64.91 kg (143 lb 1.6 oz) (08/05 2046)   Recent Labs Lab 01/09/15 1148 01/09/15 1554 01/09/15 1816 01/09/15 2155 01/10/15 0628  GLUCAP 140* 138* 142* 109* 86    Recent Labs Lab 01/08/15 2131 01/08/15 2139 01/09/15 0800 01/10/15 0505  NA 139 143 141 141   K 3.4* 3.4* 3.4* 3.2*  CL 106 106 108 109  CO2 21*  --  22 22  GLUCOSE 169* 172* 130* 90  BUN 21* 24* 21* 17  CREATININE 1.05* 1.00 0.93 1.01*  CALCIUM 9.4  --  9.2 8.8*    Recent Labs Lab 01/08/15 2131  AST 28  ALT 20  ALKPHOS 90  BILITOT 0.7  PROT 7.0  ALBUMIN 3.8    Recent Labs Lab 01/08/15 2131 01/08/15 2139 01/09/15 0800  WBC 3.7*  --  3.7*  NEUTROABS 2.3  --   --   HGB 13.6 15.6* 13.4  HCT 41.7 46.0 40.7  MCV 84.4  --  84.8  PLT 201  --  195   No results for input(s): CKTOTAL, CKMB, CKMBINDEX, TROPONINI in the last 168 hours.  Recent Labs  01/08/15 2131  LABPROT 14.6  INR 1.12   No results for input(s): COLORURINE, LABSPEC, PHURINE, GLUCOSEU, HGBUR, BILIRUBINUR, KETONESUR, PROTEINUR, UROBILINOGEN, NITRITE, LEUKOCYTESUR in the last 72 hours.  Invalid input(s): APPERANCEUR     Component Value Date/Time   CHOL 167 01/09/2015 0800   TRIG 47 01/09/2015 0800   HDL 58 01/09/2015 0800   CHOLHDL 2.9 01/09/2015 0800   VLDL 9 01/09/2015 0800   LDLCALC 100* 01/09/2015 0800   Lab Results  Component Value Date   HGBA1C 6.3* 01/09/2015   No results found for: LABOPIA, COCAINSCRNUR, LABBENZ, AMPHETMU, Abner Greenspan   Recent Labs Lab 01/08/15 2131  [facility] <5    Imaging  Ct Head Wo Contrast 01/08/2015    No acute abnormality.  Atrophy and extensive chronic microvascular ischemic change.      CTA head and neck 01/09/2015  Developing cytotoxic edema and the RIGHT posterior frontal precentral gyrus consistent with a small distal embolic infarction.  Intracranial atherosclerotic change affecting predominantly the posterior circulation with distal RIGHT vertebral and mid basilar  stenoses potentially flow reducing.  No large vessel anterior circulation occlusion or observable proximal MCA branch occlusion.  Unusual swirling appearance of contrast in the transverse arch and proximal anomaly, without signs of dissection. Favor artifact  due to mixing. If  further investigation is desired however, consider CTA chest using dissection protocol.   2D echo  01/09/2015 Normal LV size with prominent trabeculation pattern concerning for LV noncompaction.  EF 25%, diffuse hypokinesis.  (new) Mildly dilated RV with normal systolic function. Severe biatrial enlargement. Mild to moderate MR, severe TR. Mild pulmonary hypertension.   EKG - ? afib in the setting of paced rhythm - need confirmation from cardiology    PHYSICAL EXAM  Temp:  [97.7 F (36.5 C)-98.9 F (37.2 C)] 98.9 F (37.2 C) (08/06 0951) Pulse Rate:  [37-100] 72 (08/06 0951) Resp:  [13-34] 22 (08/06 0951) BP: (142-170)/(61-110) 157/86 mmHg (08/06 0951) SpO2:  [94 %-100 %] 98 % (08/06 0951) Weight:  [64.91 kg (143 lb 1.6 oz)] 64.91 kg (143 lb 1.6 oz) (08/05 2046)  General - Well nourished, well developed, in no apparent distress.  Ophthalmologic - Fundi not visualized due to noncooperation.  Cardiovascular - Regular rate and rhythm, paced.  Mental Status -  Level of arousal and orientation to time, place, and person were intact. Language including expression, naming, repetition, comprehension was assessed and found intact.  Cranial Nerves II - XII - II - Visual field intact OU. III, IV, VI - Extraocular movements intact. V - Facial sensation intact bilaterally. VII - Facial movement intact bilaterally. VIII - Hearing & vestibular intact bilaterally. X - Palate elevates symmetrically. XI - Chin turning & shoulder shrug intact bilaterally. XII - Tongue protrusion intact.  Motor Strength - The patient's strength was normal in all extremities except LUE 3/5 with pronator drift.  Bulk was normal and fasciculations were absent.   Motor Tone - Muscle tone was assessed at the neck and appendages and was normal.  Reflexes - The patient's reflexes were 1+ in all extremities and she had no pathological reflexes.  Sensory - Light touch, temperature/pinprick were assessed and  were symmetrical.    Coordination - The patient had normal movements in the right hand and feet with no ataxia or dysmetria.  Tremor was absent.  Gait and Station - deferred due to within 24 hours of TPA.   ASSESSMENT/PLAN Tamara Campbell is a 81 y.o. female with history of hypertension, diabetes mellitus, congestive heart failure, previous stroke, permanent pacemaker presenting with gazing to the right, unresponsiveness, and left hemiparesis. She received IV t-PA 52 mg on 01/08/2015 at 2215.  Stroke:  Likely right MCA infarct, probably embolic event due to unknown etiology   Resultant  LUE weakness  CTA head and neck - Developing cytotoxic edema and the RIGHT posterior frontal precentral gyrus consistent with a  small distal embolic infarction.  2D Echo EF 25%. No cardiac source of emboli identified.  LDL 100  HgbA1c 6.3  SCDs for VTE prophylaxis Diet Carb Modified Fluid consistency:: Thin; Room service appropriate?: Yes  no antithrombotic prior to admission, now on aspirin 325 mg daily.  Patient counseled to be compliant with her antithrombotic medications  Ongoing aggressive stroke risk factor management  Therapy recommendations: Pending  Disposition:  Pending  Afib  Appreciate cardiology consult and recommendations. Pacemaker interrogated yesterday.  A. fib is new from June - anticoagulation recommended. Rate is currently controlled. Medications adjusted pre cards.  The low ejection fraction of 25% is also new. She will need further follow-up with her cardiologist at [facility].  No indication for loop now that atrial fibrillation has been confirmed.  Will discuss timing of anticoagulation with Dr. Pearlean Brownie in the setting of recent stroke treated with TPA.   Hypertension  Home meds: Amlodipine, atenolol, and hydrochlorothiazide  Mildly high blood pressures  Permissive hypertension <180/105 within 24 hours of TPA  Patient counseled to be compliant with her blood  pressure medications  Hyperlipidemia  Home meds:  No lipid lowering medications prior to admission  LDL 100, goal < 70  Add Lipitor 20 mg daily  Continue statin at discharge  Diabetes  HgbA1c 6.3, goal < 7.0  Controlled  Other Stroke Risk Factors  Advanced age  Former cigarette smoker, has quit smoking  Hx stroke/TIA in 2012  Other Active Problems  PPM - St Jude - placed in [place]. Cardiology interrogated 01/09/15  Hypokalemia - supplement - recheck Monday  Mildly elevated BUN  Chronic wound left lower extremity. Wound care nurse following.  Other Pertinent History    Hassel Neth [facility] Pager [phone] 01/10/2015, 1:31 PM  Hospital day # 2  I have personally examined this patient, reviewed notes, independently viewed imaging studies, participated in medical decision making and plan of care. I have made any additions or clarifications directly to the above note. Agree with note above. Await cardiology opinion about AFIB  Delia Heady, MD Medical Director [facility] Pager: [phone] 01/10/2015 2:55 PM    To contact Stroke Continuity provider, please refer to [email or web address]. After hours, contact General Neurology

## 2015-01-11 LAB — URINALYSIS, ROUTINE W REFLEX MICROSCOPIC
Bilirubin Urine: NEGATIVE
Glucose, UA: NEGATIVE mg/dL
Hgb urine dipstick: NEGATIVE
Ketones, ur: NEGATIVE mg/dL
Leukocytes, UA: NEGATIVE
NITRITE: NEGATIVE
PROTEIN: NEGATIVE mg/dL
Specific Gravity, Urine: 1.012 (ref 1.005–1.030)
UROBILINOGEN UA: 0.2 mg/dL (ref 0.0–1.0)
pH: 5 (ref 5.0–8.0)

## 2015-01-11 LAB — CBC
HEMATOCRIT: 42.2 % (ref 36.0–46.0)
HEMOGLOBIN: 14 g/dL (ref 12.0–15.0)
MCH: 27.6 pg (ref 26.0–34.0)
MCHC: 33.2 g/dL (ref 30.0–36.0)
MCV: 83.2 fL (ref 78.0–100.0)
Platelets: 191 10*3/uL (ref 150–400)
RBC: 5.07 MIL/uL (ref 3.87–5.11)
RDW: 15.2 % (ref 11.5–15.5)
WBC: 5 10*3/uL (ref 4.0–10.5)

## 2015-01-11 LAB — BASIC METABOLIC PANEL
Anion gap: 12 (ref 5–15)
BUN: 27 mg/dL — ABNORMAL HIGH (ref 6–20)
CALCIUM: 9.1 mg/dL (ref 8.9–10.3)
CO2: 19 mmol/L — ABNORMAL LOW (ref 22–32)
Chloride: 106 mmol/L (ref 101–111)
Creatinine, Ser: 1.01 mg/dL — ABNORMAL HIGH (ref 0.44–1.00)
GFR calc Af Amer: 58 mL/min — ABNORMAL LOW (ref >60)
GFR calc non Af Amer: 50 mL/min — ABNORMAL LOW (ref >60)
Glucose, Bld: 117 mg/dL — ABNORMAL HIGH (ref 65–99)
Potassium: 4 mmol/L (ref 3.5–5.1)
Sodium: 137 mmol/L (ref 135–145)

## 2015-01-11 LAB — GLUCOSE, CAPILLARY
GLUCOSE-CAPILLARY: 159 mg/dL — AB (ref 65–99)
Glucose-Capillary: 112 mg/dL — ABNORMAL HIGH (ref 65–99)
Glucose-Capillary: 95 mg/dL (ref 65–99)
Glucose-Capillary: 145 mg/dL — ABNORMAL HIGH (ref 65–99)

## 2015-01-11 MED ORDER — APIXABAN 5 MG PO TABS
5.0000 mg | ORAL_TABLET | Freq: Two times a day (BID) | ORAL | Status: DC
  Administered 2015-01-11 – 2015-01-13 (×5): 5 mg via ORAL
  Filled 2015-01-11 (×5): qty 1

## 2015-01-11 NOTE — Progress Notes (Signed)
ANTICOAGULATION CONSULT NOTE - Initial Consult  Pharmacy Consult for Eliquis Indication: atrial fibrillation  No Known Allergies  Patient Measurements: Height:  (160 cm) Weight: 143 lb 1.6 oz (64.91 kg) IBW/kg (Calculated) : 52.4   Vital Signs: Temp: 98.2 F (36.8 C) (08/07 0936) Temp Source: Oral (08/07 0936) BP: 168/92 mmHg (08/07 0936) Pulse Rate: 72 (08/07 0936)  Labs:  Recent Labs  01/08/15 2131  01/09/15 0800 01/10/15 0505 01/10/15 1215 01/11/15 0830  HGB 13.6  < > 13.4  --  13.7 14.0  HCT 41.7  < > 40.7  --  41.9 42.2  PLT 201  --  195  --  216 191  APTT 27  --   --   --   --   --   LABPROT 14.6  --   --   --   --   --   INR 1.12  --   --   --   --   --   CREATININE 1.05*  < > 0.93 1.01*  --  1.01*  < > = values in this interval not displayed.  Estimated Creatinine Clearance: 38.9 mL/min (by C-G formula based on Cr of 1.01).  Assessment: 81 YOF here with stroke. She is s/p tPA administration on 8/4 at 2211. She has history of Afib with [employer] Jude PPM. Not on AC PTA.  To start Eliquis for AFib treatment. Age: 81, wt: 64.9kg, SCr: 1.01. She does not meet 2/3 requirements for dose reduction, though weight is close.  CBC is WNL. No bleeding noted.  Goal of Therapy:    Monitor platelets by anticoagulation protocol: Yes   Plan:  -Eliquis  BID -CBC q72h while in the hospital -will follow weight and renal function for any need to decrease Eliquis dose -will educate prior to discharge  Seleni Meller D. Juliyah Mergen, PharmD, BCPS Clinical Pharmacist Pager: [phone] 01/11/2015 11:13 AM

## 2015-01-11 NOTE — Progress Notes (Signed)
STROKE TEAM PROGRESS NOTE   HISTORY Tamara Campbell is an 81 y.o. female with a history of HTN and diabetes who re[ports noncompliance with her antihypertensives who was at her baseline today. This evening she sat down to eat supper and noted no problems. When she attempted to get up to leave the table she was unable to support herself and fell. She noted left sided weakness. EMS was called at that time. Patient was hypertensive. En route was noted to have a starring spell when she gazed to the right and did not respond to questioning.  Patient has had a stroke in the past in 2005. Patient had some mild right sided weakness as residual from this per daughter.  At baseline patient lives with son. She ambulates with a [place]. She dresses herself and goes to the bathroom independently. She does not report incontinence but does wear Depends because she reports that she does not always make it to the bathroom. Her son does the cooking and cleaning.   Date last known well: Date: 01/08/2015 Time last known well: Time: 18:00 tPA Given: Yes  Modified Rankin: Rankin Score=3    SUBJECTIVE (INTERVAL HISTORY) No family members present. The patient is alert and responds appropriately to questions. She continues to have left upper extremity weakness more distal than proximal. Awaiting cardiology evaluation.  OBJECTIVE Temp:  [98.1 F (36.7 C)-98.7 F (37.1 C)] 98.2 F (36.8 C) (08/07 0936) Pulse Rate:  [70-78] 72 (08/07 0936) Cardiac Rhythm:  [-] Ventricular paced (08/07 0800) Resp:  [20-24] 20 (08/07 0936) BP: (127-168)/(85-111) 168/92 mmHg (08/07 0936) SpO2:  [96 %-100 %] 100 % (08/07 0936)   Recent Labs Lab 01/10/15 1644 01/10/15 1818 01/10/15 2146 01/11/15 0648 01/11/15 1130  GLUCAP 72 119* 138* 112* 159*    Recent Labs Lab 01/08/15 2131 01/08/15 2139 01/09/15 0800 01/10/15 0505 01/11/15 0830  NA 139 143 141 141 137  K 3.4* 3.4* 3.4* 3.2* 4.0  CL 106 106 108 109 106   CO2 21*  --  22 22 19*  GLUCOSE 169* 172* 130* 90 117*  BUN 21* 24* 21* 17 27*  CREATININE 1.05* 1.00 0.93 1.01* 1.01*  CALCIUM 9.4  --  9.2 8.8* 9.1    Recent Labs Lab 01/08/15 2131  AST 28  ALT 20  ALKPHOS 90  BILITOT 0.7  PROT 7.0  ALBUMIN 3.8    Recent Labs Lab 01/08/15 2131 01/08/15 2139 01/09/15 0800 01/10/15 1215 01/11/15 0830  WBC 3.7*  --  3.7* 4.3 5.0  NEUTROABS 2.3  --   --   --   --   HGB 13.6 15.6* 13.4 13.7 14.0  HCT 41.7 46.0 40.7 41.9 42.2  MCV 84.4  --  84.8 83.3 83.2  PLT 201  --  195 216 191   No results for input(s): CKTOTAL, CKMB, CKMBINDEX, TROPONINI in the last 168 hours.  Recent Labs  01/08/15 2131  LABPROT 14.6  INR 1.12   No results for input(s): COLORURINE, LABSPEC, PHURINE, GLUCOSEU, HGBUR, BILIRUBINUR, KETONESUR, PROTEINUR, UROBILINOGEN, NITRITE, LEUKOCYTESUR in the last 72 hours.  Invalid input(s): APPERANCEUR     Component Value Date/Time   CHOL 167 01/09/2015 0800   TRIG 47 01/09/2015 0800   HDL 58 01/09/2015 0800   CHOLHDL 2.9 01/09/2015 0800   VLDL 9 01/09/2015 0800   LDLCALC 100* 01/09/2015 0800   Lab Results  Component Value Date   HGBA1C 6.3* 01/09/2015   No results found for: LABOPIA, COCAINSCRNUR, LABBENZ, AMPHETMU, THCU, LABBARB  Recent Labs Lab 01/08/15 2131  [facility] <5    Imaging  Ct Head Wo Contrast 01/08/2015    No acute abnormality.  Atrophy and extensive chronic microvascular ischemic change.      CTA head and neck 01/09/2015  Developing cytotoxic edema and the RIGHT posterior frontal precentral gyrus consistent with a small distal embolic infarction.  Intracranial atherosclerotic change affecting predominantly the posterior circulation with distal RIGHT vertebral and mid basilar  stenoses potentially flow reducing.  No large vessel anterior circulation occlusion or observable proximal MCA branch occlusion.  Unusual swirling appearance of contrast in the transverse arch and proximal anomaly,  without signs of dissection. Favor artifact  due to mixing. If further investigation is desired however, consider CTA chest using dissection protocol.   2D echo  01/09/2015 Normal LV size with prominent trabeculation pattern concerning for LV noncompaction.  EF 25%, diffuse hypokinesis.  (new) Mildly dilated RV with normal systolic function. Severe biatrial enlargement. Mild to moderate MR, severe TR. Mild pulmonary hypertension.   EKG - ? afib in the setting of paced rhythm - need confirmation from cardiology    PHYSICAL EXAM  Temp:  [98.1 F (36.7 C)-98.7 F (37.1 C)] 98.2 F (36.8 C) (08/07 0936) Pulse Rate:  [70-78] 72 (08/07 0936) Resp:  [20-24] 20 (08/07 0936) BP: (127-168)/(85-111) 168/92 mmHg (08/07 0936) SpO2:  [96 %-100 %] 100 % (08/07 0936)  General - Well nourished, well developed, in no apparent distress.  Ophthalmologic - Fundi not visualized due to noncooperation.  Cardiovascular - Regular rate and rhythm, paced.  Mental Status -  Level of arousal and orientation to time, place, and person were intact. Language including expression, naming, repetition, comprehension was assessed and found intact.  Cranial Nerves II - XII - II - Visual field intact OU. III, IV, VI - Extraocular movements intact. V - Facial sensation intact bilaterally. VII - Facial movement intact bilaterally. VIII - Hearing & vestibular intact bilaterally. X - Palate elevates symmetrically. XI - Chin turning & shoulder shrug intact bilaterally. XII - Tongue protrusion intact.  Motor Strength - The patient's strength was normal in all extremities except LUE 3/5 with pronator drift.  Bulk was normal and fasciculations were absent.   Motor Tone - Muscle tone was assessed at the neck and appendages and was normal.  Reflexes - The patient's reflexes were 1+ in all extremities and she had no pathological reflexes.  Sensory - Light touch, temperature/pinprick were assessed and were  symmetrical.    Coordination - The patient had normal movements in the right hand and feet with no ataxia or dysmetria.  Tremor was absent.  Gait and Station - deferred due to within 24 hours of TPA.   ASSESSMENT/PLAN [name] is a 81 y.o. female with history of hypertension, diabetes mellitus, congestive heart failure, previous stroke, permanent pacemaker presenting with gazing to the right, unresponsiveness, and left hemiparesis. She received IV t-PA 52 mg on 01/08/2015 at 2215.  Stroke:  Likely right MCA infarct, probably embolic event due to unknown etiology   Resultant  LUE weakness  CTA head and neck - Developing cytotoxic edema and the RIGHT posterior frontal precentral gyrus consistent with a  small distal embolic infarction.  2D Echo EF 25%. No cardiac source of emboli identified.  LDL 100  HgbA1c 6.3  SCDs for VTE prophylaxis Diet Carb Modified Fluid consistency:: Thin; Room service appropriate?: Yes  no antithrombotic prior to admission, now on aspirin 325 mg daily.  Patient counseled to  be compliant with her antithrombotic medications  Ongoing aggressive stroke risk factor management  Therapy recommendations: CIR recommended. Preadmission screening has been ordered.   Disposition:  Pending  Afib  Appreciate cardiology consult and recommendations. Pacemaker interrogated yesterday.  A. fib is new from June - anticoagulation recommended. Rate is currently controlled. Medications adjusted pre cards.  The low ejection fraction of 25% is also new. She will need further follow-up with her cardiologist at [facility]  No indication for loop now that atrial fibrillation has been confirmed.  Will discuss timing of anticoagulation with Dr. Pearlean Brownie in the setting of recent stroke treated with TPA.   Hypertension  Home meds: Amlodipine, atenolol, and hydrochlorothiazide  Mildly high blood pressures  Permissive hypertension <180/105 within 24 hours of  TPA  Patient counseled to be compliant with her blood pressure medications  Hyperlipidemia  Home meds:  No lipid lowering medications prior to admission  LDL 100, goal < 70  Add Lipitor 20 mg daily  Continue statin at discharge  Diabetes  HgbA1c 6.3, goal < 7.0  Controlled  Other Stroke Risk Factors  Advanced age  Former cigarette smoker, has quit smoking  Hx stroke/TIA in 2012  Other Active Problems  PPM - St Jude - placed in [place]. Cardiology interrogated 01/09/15  Hypokalemia - supplement - recheck Monday  Mildly elevated BUN  Chronic wound left lower extremity. Wound care nurse following.  Other Pertinent History  Cardiology consult - patient has atrial fibrillation based on permanent pacemaker. No need for loop recorder.  Cardiomyopathy is new. EF 25%. Meds adjusted by cardiology. Now on Coreg and ACE inhibitor.  Follow-up with cardiologist at [facility]  Appreciate cardiology assistance  Start Eliquis on Monday per pharmacy dosing (order placed)  Delton See PA-C [facility] Pager [phone] 01/11/2015, 2:41 PM  Hospital day # 3  I have personally examined this patient, reviewed notes, independently viewed imaging studies, participated in medical decision making and plan of care. I have made any additions or clarifications directly to the above note. Agree with note above. Await cardiology opinion about AFIB Delia Heady, MD    To contact Stroke Continuity provider, please refer to [email or web address]. After hours, contact General Neurology

## 2015-01-11 NOTE — Evaluation (Signed)
Occupational Therapy Evaluation Patient Details Name: Tamara Campbell MRN: [id number] DOB: 1931/03/27 Today's Date: 01/11/2015    History of Present Illness 81 y.o. female admitted for Stroke and afib.   Clinical Impression   Pt was independent with AD prior to admission and lived with her son.  Pt presents with impaired cognition, L side weakness and decreased balance interfering with her ability to perform ADL and mobility. Pt will need post acute rehab prior to return home with her supportive family.  Recommending inpatient rehab.  Will follow acutely.  Follow Up Recommendations  CIR    Equipment Recommendations  Other (comment) (to be determined in next venue)    Recommendations for Other Services       Precautions / Restrictions Precautions Precautions: Fall Restrictions Weight Bearing Restrictions: No      Mobility Bed Mobility               General bed mobility comments: pt in chair  Transfers Overall transfer level: Needs assistance Equipment used: Rolling [place] (2 wheeled) Transfers: Sit to/from [employer] Sit to Stand: Min assist Stand pivot transfers: Min assist       General transfer comment: min assist to rise and assist with shifting weight anterior over feet, assist to place L UE     Balance Overall balance assessment: Needs assistance Sitting-balance support: Feet supported Sitting balance-Leahy Scale: Fair       Standing balance-Leahy Scale: Poor Standing balance comment: posterior lean initially, unable to release Guadalupe in standing for ADL                            ADL Overall ADL's : Needs assistance/impaired Eating/Feeding: Minimal assistance;Sitting Eating/Feeding Details (indicate cue type and reason): spontaneously used L UE to stabilize cup and bowl, but not effectively. Grooming: Wash/dry hands;Wash/dry face;Sitting;Minimal assistance   Upper Body Bathing: Moderate assistance;Sitting   Lower  Body Bathing: Maximal assistance;Sit to/from stand   Upper Body Dressing : Moderate assistance;Sitting   Lower Body Dressing: Maximal assistance;Sit to/from stand   Toilet Transfer: Minimal assistance;Stand-pivot;Ambulation;BSC;RW   Toileting- Clothing Manipulation and Hygiene: Maximal assistance;Sit to/from stand               Vision Vision Assessment?: No apparent visual deficits   Perception     Praxis      Pertinent Vitals/Pain Pain Assessment: No/denies pain     Hand Dominance Right   Extremity/Trunk Assessment Upper Extremity Assessment Upper Extremity Assessment: RUE deficits/detail;LUE deficits/detail RUE Deficits / Details: 4/5 LUE Deficits / Details: 3/5 LUE Sensation: decreased proprioception LUE Coordination: decreased fine motor;decreased gross motor   Lower Extremity Assessment Lower Extremity Assessment: Defer to PT evaluation       Communication Communication Communication: No difficulties   Cognition Arousal/Alertness: Awake/alert Behavior During Therapy: WFL for tasks assessed/performed Overall Cognitive Status: Impaired/Different from baseline Area of Impairment: Orientation;Attention;Memory;Safety/judgement;Problem solving Orientation Level: Disoriented to;Time;Situation;Place Current Attention Level: Sustained Memory: Decreased short-term memory   Safety/Judgement: Decreased awareness of safety;Decreased awareness of deficits   Problem Solving: Slow processing;Decreased initiation;Difficulty sequencing;Requires verbal cues;Requires tactile cues     General Comments       Exercises       Shoulder Instructions      Home Living Family/patient expects to be discharged to:: Private residence Living Arrangements: Children (son--Anthony) Available Help at Discharge: Family;Available 24 hours/day Type of Home: House Home Access: Ramped entrance     Home Layout: One level  Bathroom Shower/Tub: Higher education careers adviser: Standard     Home Equipment: Environmental consultant - 2 wheels;Cane - single point;Shower seat - built in;Grab bars - tub/shower      Lives With: Son    Prior Functioning/Environment Level of Independence: Independent with assistive device(s)        Comments: Driving PTA. Uses cane and RW.    OT Diagnosis: Generalized weakness;Cognitive deficits;Hemiplegia non-dominant side   OT Problem List: Decreased strength;Decreased activity tolerance;Impaired balance (sitting and/or standing);Decreased coordination;Decreased cognition;Decreased knowledge of use of DME or AE;Decreased safety awareness;Impaired sensation;Impaired UE functional use   OT Treatment/Interventions: Self-care/ADL training;Neuromuscular education;DME and/or AE instruction;Therapeutic activities;Cognitive remediation/compensation;Patient/family education;Balance training    OT Goals(Current goals can be found in the care plan section) Acute Rehab OT Goals Patient Stated Goal: go home OT Goal Formulation: With patient Potential to Achieve Goals: Good ADL Goals Pt Will Perform Grooming: with min assist;standing Pt Will Perform Upper Body Bathing: with min assist;sitting Pt Will Perform Lower Body Bathing: with min assist;sit to/from stand Pt Will Perform Upper Body Dressing: with min assist;sitting Pt Will Perform Lower Body Dressing: with min assist;sit to/from stand Pt Will Transfer to Toilet: with min guard assist;ambulating;bedside commode Pt Will Perform Toileting - Clothing Manipulation and hygiene: with min assist;sit to/from stand Additional ADL Goal #1: Pt will use L UE as a functional stabilizer in bilateral ADL. Additional ADL Goal #2: Pt will use environmental cues for orientation to place, time.  OT Frequency: Min 3X/week   Barriers to D/C:            Co-evaluation              End of Session    Activity Tolerance: Patient tolerated treatment well Patient left: in chair;with call bell/phone within  reach;with nursing/sitter in room   Time: 1226-1258 OT Time Calculation (min): 32 min Charges:  OT General Charges $OT Visit: 1 Procedure OT Evaluation $Initial OT Evaluation Tier I: 1 Procedure OT Treatments $Self Care/Home Management : 8-22 mins G-Codes:    Evern Bio 01/11/2015, 1:11 PM [phone]

## 2015-01-11 NOTE — Progress Notes (Signed)
Patient had 5 beat run of PVC's.

## 2015-01-12 DIAGNOSIS — I480 Paroxysmal atrial fibrillation: Secondary | ICD-10-CM

## 2015-01-12 DIAGNOSIS — I481 Persistent atrial fibrillation: Secondary | ICD-10-CM

## 2015-01-12 DIAGNOSIS — I63511 Cerebral infarction due to unspecified occlusion or stenosis of right middle cerebral artery: Secondary | ICD-10-CM

## 2015-01-12 DIAGNOSIS — I6340 Cerebral infarction due to embolism of unspecified cerebral artery: Secondary | ICD-10-CM

## 2015-01-12 DIAGNOSIS — G8190 Hemiplegia, unspecified affecting unspecified side: Secondary | ICD-10-CM

## 2015-01-12 DIAGNOSIS — I63419 Cerebral infarction due to embolism of unspecified middle cerebral artery: Secondary | ICD-10-CM

## 2015-01-12 LAB — RAPID URINE DRUG SCREEN, HOSP PERFORMED
AMPHETAMINES: NOT DETECTED
BARBITURATES: NOT DETECTED
Benzodiazepines: NOT DETECTED
Cocaine: NOT DETECTED
OPIATES: NOT DETECTED
TETRAHYDROCANNABINOL: NOT DETECTED

## 2015-01-12 LAB — GLUCOSE, CAPILLARY
Glucose-Capillary: 143 mg/dL — ABNORMAL HIGH (ref 65–99)
Glucose-Capillary: 114 mg/dL — ABNORMAL HIGH (ref 65–99)
Glucose-Capillary: 159 mg/dL — ABNORMAL HIGH (ref 65–99)
Glucose-Capillary: 92 mg/dL (ref 65–99)

## 2015-01-12 LAB — BASIC METABOLIC PANEL
ANION GAP: 9 (ref 5–15)
BUN: 26 mg/dL — AB (ref 6–20)
CALCIUM: 8.6 mg/dL — AB (ref 8.9–10.3)
CO2: 22 mmol/L (ref 22–32)
CREATININE: 1.04 mg/dL — AB (ref 0.44–1.00)
Chloride: 107 mmol/L (ref 101–111)
GFR, EST AFRICAN AMERICAN: 56 mL/min — AB (ref >60)
GFR, EST NON AFRICAN AMERICAN: 49 mL/min — AB (ref >60)
Glucose, Bld: 131 mg/dL — ABNORMAL HIGH (ref 65–99)
Potassium: 4.4 mmol/L (ref 3.5–5.1)
Sodium: 138 mmol/L (ref 135–145)

## 2015-01-12 NOTE — Progress Notes (Signed)
9 beat run of V-tach, CV strip uploaded into [facility].  MD notified.

## 2015-01-12 NOTE — Progress Notes (Signed)
Rehab Admissions Coordinator Note:  Patient was screened by Tamara Campbell L for appropriateness for an Inpatient Acute Rehab Consult.  At this time, we are recommending Inpatient Rehab consult. I will contact neuro for rehab consult order. Thanks.  Tamara Campbell L 01/12/2015, 9:32 AM  I can be reached at [phone].

## 2015-01-12 NOTE — Progress Notes (Signed)
STROKE TEAM PROGRESS NOTE   HISTORY Tamara Campbell is an 81 y.o. female with a history of HTN and diabetes who reports noncompliance with her antihypertensives who was at her baseline today. This evening she sat down to eat supper and noted no problems. When she attempted to get up to leave the table she was unable to support herself and fell. She noted left sided weakness. EMS was called at that time. Patient was hypertensive. En route was noted to have a starring spell when she gazed to the right and did not respond to questioning. Patient has had a stroke in the past in 2005. Patient had some mild right sided weakness as residual from this per daughter. At baseline patient lives with son. She ambulates with a Joerger. She dresses herself and goes to the bathroom independently. She does not report incontinence but does wear Depends because she reports that she does not always make it to the bathroom. Her son does the cooking and cleaning.  Modified Rankin: Rankin Score=3   Date last known well: Date: 01/08/2015 Time last known well: Time: 18:00 tPA Given: Yes   SUBJECTIVE (INTERVAL HISTORY) Daughter at bedside. PT working with patient. Await rehab bed   OBJECTIVE Temp:  [97.8 F (36.6 C)-98.6 F (37 C)] 98 F (36.7 C) (08/08 0906) Pulse Rate:  [72-74] 72 (08/08 0906) Cardiac Rhythm:  [-] Ventricular paced (08/08 0749) Resp:  [19-20] 20 (08/08 0906) BP: (127-147)/(75-102) 127/75 mmHg (08/08 0906) SpO2:  [96 %-99 %] 99 % (08/08 0906)   Recent Labs Lab 01/11/15 1130 01/11/15 1623 01/11/15 2110 01/12/15 0643 01/12/15 1129  GLUCAP 159* 95 145* 143* 114*    Recent Labs Lab 01/08/15 2131 01/08/15 2139 01/09/15 0800 01/10/15 0505 01/11/15 0830 01/12/15 0744  NA 139 143 141 141 137 138  K 3.4* 3.4* 3.4* 3.2* 4.0 4.4  CL 106 106 108 109 106 107  CO2 21*  --  22 22 19* 22  GLUCOSE 169* 172* 130* 90 117* 131*  BUN 21* 24* 21* 17 27* 26*  CREATININE 1.05* 1.00 0.93  1.01* 1.01* 1.04*  CALCIUM 9.4  --  9.2 8.8* 9.1 8.6*    Recent Labs Lab 01/08/15 2131  AST 28  ALT 20  ALKPHOS 90  BILITOT 0.7  PROT 7.0  ALBUMIN 3.8    Recent Labs Lab 01/08/15 2131 01/08/15 2139 01/09/15 0800 01/10/15 1215 01/11/15 0830  WBC 3.7*  --  3.7* 4.3 5.0  NEUTROABS 2.3  --   --   --   --   HGB 13.6 15.6* 13.4 13.7 14.0  HCT 41.7 46.0 40.7 41.9 42.2  MCV 84.4  --  84.8 83.3 83.2  PLT 201  --  195 216 191   No results for input(s): CKTOTAL, CKMB, CKMBINDEX, TROPONINI in the last 168 hours. No results for input(s): LABPROT, INR in the last 72 hours.  Recent Labs  01/11/15 2338  COLORURINE YELLOW  LABSPEC 1.012  PHURINE 5.0  GLUCOSEU NEGATIVE  HGBUR NEGATIVE  BILIRUBINUR NEGATIVE  KETONESUR NEGATIVE  PROTEINUR NEGATIVE  UROBILINOGEN 0.2  NITRITE NEGATIVE  LEUKOCYTESUR NEGATIVE       Component Value Date/Time   CHOL 167 01/09/2015 0800   TRIG 47 01/09/2015 0800   HDL 58 01/09/2015 0800   CHOLHDL 2.9 01/09/2015 0800   VLDL 9 01/09/2015 0800   LDLCALC 100* 01/09/2015 0800   Lab Results  Component Value Date   HGBA1C 6.3* 01/09/2015      Component Value Date/Time  LABOPIA NONE DETECTED 01/11/2015 2338   COCAINSCRNUR NONE DETECTED 01/11/2015 2338   LABBENZ NONE DETECTED 01/11/2015 2338   AMPHETMU NONE DETECTED 01/11/2015 2338   THCU NONE DETECTED 01/11/2015 2338   LABBARB NONE DETECTED 01/11/2015 2338     Recent Labs Lab 01/08/15 2131  ETH <5    Ct Head Wo Contrast 01/08/2015    No acute abnormality.  Atrophy and extensive chronic microvascular ischemic change.     CTA head and neck 01/09/2015  Developing cytotoxic edema and the RIGHT posterior frontal precentral gyrus consistent with a small distal embolic infarction.  Intracranial atherosclerotic change affecting predominantly the posterior circulation with distal RIGHT vertebral and mid basilar stenoses potentially flow reducing.  No large vessel anterior circulation  occlusion or observable proximal MCA branch occlusion.  Unusual swirling appearance of contrast in the transverse arch and proximal anomaly, without signs of dissection. Favor artifact due to mixing.   2D echo  01/09/2015 Normal LV size with prominent trabeculation pattern concerning for LV noncompaction.  EF 25%, diffuse hypokinesis.  (new) Mildly dilated RV with normal systolic function. Severe biatrial enlargement. Mild to moderate MR, severe TR. Mild pulmonary hypertension.  EKG - ? afib in the setting of paced rhythm - need confirmation from cardiology   PHYSICAL EXAM Temp:  [97.8 F (36.6 C)-98.6 F (37 C)] 98 F (36.7 C) (08/08 0906) Pulse Rate:  [72-74] 72 (08/08 0906) Resp:  [19-20] 20 (08/08 0906) BP: (127-147)/(75-102) 127/75 mmHg (08/08 0906) SpO2:  [96 %-99 %] 99 % (08/08 0906)  General - Well nourished, well developed, in no apparent distress.  Ophthalmologic - Fundi not visualized due to noncooperation.  Cardiovascular - Regular rate and rhythm, paced.  Mental Status -  Level of arousal and orientation to time, place, and person were intact. Language including expression, naming, repetition, comprehension was assessed and found intact.  Cranial Nerves II - XII - II - Visual field intact OU. III, IV, VI - Extraocular movements intact. V - Facial sensation intact bilaterally. VII - Facial movement intact bilaterally. VIII - Hearing & vestibular intact bilaterally. X - Palate elevates symmetrically. XI - Chin turning & shoulder shrug intact bilaterally. XII - Tongue protrusion intact.  Motor Strength - The patient's strength was normal in all extremities except LUE 3/5 with pronator drift.marked left hand and grip weakness.  Bulk was normal and fasciculations were absent.   Motor Tone - Muscle tone was assessed at the neck and appendages and was normal.  Reflexes - The patient's reflexes were 1+ in all extremities and she had no pathological  reflexes.  Sensory - Light touch, temperature/pinprick were assessed and were symmetrical.    Coordination - The patient had normal movements in the right hand and feet with no ataxia or dysmetria.  Tremor was absent.  Gait and Station - deferred due to within 24 hours of TPA.   ASSESSMENT/PLAN Ms. ECHO PROPP is a 81 y.o. female with history of hypertension, diabetes mellitus, congestive heart failure, previous stroke, permanent pacemaker presenting with gazing to the right, unresponsiveness, and left hemiparesis. She received IV t-PA 52 mg on 01/08/2015 at 2215.  Stroke:  Likely right posterior prefrontal gyrus infarct, probably embolic event due to unknown etiology   Resultant  LUE weakness  CTA head and neck - Developing cytotoxic edema and the RIGHT posterior frontal precentral gyrus consistent with a small distal embolic infarction.  2D Echo EF 25%. No cardiac source of emboli identified.  LDL 100  HgbA1c 6.3  SCDs for VTE prophylaxis Diet Carb Modified Fluid consistency:: Thin; Room service appropriate?: Yes  no antithrombotic prior to admission, on aspirin 325 mg daily post tPA. Changed to Eliquis given atrial fibrillation   Patient counseled to be compliant with her antithrombotic medications  Ongoing aggressive stroke risk factor management  Therapy recommendations: CIR recommended. Consulted.   Disposition:  Pending. Medically ready for discharge once bed available.   Afib  Appreciate cardiology consult and recommendations.   PPM - St Jude - placed in [place]. Cardiology interrogated 01/09/15  A. fib is new from June - anticoagulation recommended. Rate is currently controlled. Medications adjusted per cards.  No indication for loop now that atrial fibrillation has been confirmed.  Started Eliquis 5 mg bid  Cardiomyopathy  The low ejection fraction of 25% is new.   Meds adjusted by cardiology. Now on Coreg and ACE inhibitor.  She will need further  follow-up with her cardiologist at [facility].  Hypertension  Home meds: Amlodipine, atenolol, and hydrochlorothiazide  stable  Patient counseled to be compliant with her blood pressure medications  Hyperlipidemia  Home meds:  No lipid lowering medications prior to admission  LDL 100, goal < 70  Added Lipitor 20 mg daily  Continue statin at discharge  Diabetes  HgbA1c 6.3, goal < 7.0  Controlled  Other Stroke Risk Factors  Advanced age  Former cigarette smoker, has quit smoking  Hx stroke/TIA in 2012  Other Active Problems  Hypokalemia - replaced - 4.4 this am  Mildly elevated BUN  Chronic wound left lower extremity. Wound care nurse following.  Hospital day # 4  [facility] See Amion for Pager information 01/12/2015 12:59 PM  I have personally examined this patient, reviewed notes, independently viewed imaging studies, participated in medical decision making and plan of care. I have made any additions or clarifications directly to the above note. Agree with note above.    Delia Heady, MD Medical Director [facility] Pager: [phone] 01/12/2015 1:57 PM  To contact Stroke Continuity provider, please refer to [email or web address]. After hours, contact General Neurology

## 2015-01-12 NOTE — Discharge Instructions (Signed)

## 2015-01-12 NOTE — Progress Notes (Signed)
Physical Therapy Treatment Patient Details Name: Tamara Campbell MRN: [id number] DOB: 12/30/1930 Today's Date: 01/12/2015    History of Present Illness 81 y.o. female admitted for Stroke and afib.    PT Comments    Pt progressing with mobility, ambulated 35' with mod A and worked on standing balance and increasing endurance. Not as strong posterior lean today. Did c/o left hip pain with ambulation, continues to watch this as she has had THA bilaterally.    Follow Up Recommendations  CIR     Equipment Recommendations  None recommended by PT    Recommendations for Other Services Rehab consult;OT consult     Precautions / Restrictions Precautions Precautions: Fall Restrictions Weight Bearing Restrictions: No    Mobility  Bed Mobility               General bed mobility comments: pt in chair  Transfers Overall transfer level: Needs assistance Equipment used: Rolling Wiley (2 wheeled) Transfers: Sit to/from Stand Sit to Stand: Mod assist         General transfer comment: pt tries to stand with hips too far back in chair, had to cue her to slide fwd each time (5x). Then tries to push up with hands too far back on arms of chair and inability to wt shift fwd. Unable to problem solve through this situation herself and correct. Mod A requir4ed for fwd wt shift and power up.   Ambulation/Gait Ambulation/Gait assistance: Mod assist Ambulation Distance (Feet): 40 Feet Assistive device: Rolling [place]d (2 wheeled) Gait Pattern/deviations: Step-through pattern;Decreased stride length;Trunk flexed Gait velocity: slow Gait velocity interpretation: <1.8 ft/sec, indicative of risk for recurrent falls General Gait Details: stonger, right side elevated with effort, cues for relaxing right shoulder. One standing rest break. Not as much posterior lean today but occasional partial buckling of left knee so mod A needed consistently   Stairs            Wheelchair Mobility     Modified Rankin (Stroke Patients Only) Modified Rankin (Stroke Patients Only) Pre-Morbid Rankin Score: Slight disability Modified Rankin: Moderately severe disability     Balance Overall balance assessment: Needs assistance Sitting-balance support: No upper extremity supported Sitting balance-Leahy Scale: Fair     Standing balance support: Bilateral upper extremity supported Standing balance-Leahy Scale: Poor Standing balance comment: maintained standing multiple times for 1-3 minutes, worked on finding center of Product manager Arousal/Alertness: Awake/alert Behavior During Therapy: WFL for tasks assessed/performed Overall Cognitive Status: Impaired/Different from baseline Area of Impairment: Problem solving Orientation Level: Disoriented to;Situation Current Attention Level: Selective Memory: Decreased short-term memory   Safety/Judgement: Decreased awareness of safety;Decreased awareness of deficits   Problem Solving: Slow processing;Decreased initiation;Difficulty sequencing;Requires verbal cues;Requires tactile cues      Exercises      General Comments        Pertinent Vitals/Pain Pain Assessment: Faces Faces Pain Scale: Hurts even more Pain Location: left hip Pain Intervention(s): Limited activity within patient's tolerance;Monitored during session;Repositioned    Home Living                      Prior Function            PT Goals (current goals can now be found in the care plan section) Acute Rehab PT Goals Patient Stated Goal: go home PT Goal Formulation: With patient Time For Goal Achievement: 01/24/15  Potential to Achieve Goals: Good Progress towards PT goals: Progressing toward goals    Frequency  Min 4X/week    PT Plan Current plan remains appropriate    Co-evaluation             End of Session Equipment Utilized During Treatment: Gait belt Activity Tolerance: Patient tolerated treatment  well Patient left: in chair;with call bell/phone within reach;with chair alarm set     Time: 1235-1310 PT Time Calculation (min) (ACUTE ONLY): 35 min  Charges:  $Gait Training: 23-37 mins                    G Codes:     Lyanne Co, PT  Acute Rehab Services  [phone]  Pecola Leisure, [place] 01/12/2015, 1:56 PM

## 2015-01-12 NOTE — Progress Notes (Signed)
Per rep at [facility]:   Patient does receive extra help for prescriptions:  Eliquis: patient will pay 15% of the total cost of the medication at the retail pharmacy.   Patient can use any pharmacy Per rep at [facility]:

## 2015-01-12 NOTE — Care Management Important Message (Signed)
Important Message  Patient Details  Name: Tamara Campbell MRN: [id number] Date of Birth: [birth year]   Medicare Important Message Given:  Yes-second notification given    Orson Aloe 01/12/2015, 4:02 PM

## 2015-01-12 NOTE — Consult Note (Signed)
Physical Medicine and Rehabilitation Consult   Reason for Consult: Left sided weakness and problems with memory Referring Physician: Dr. Pearlean Brownie.    HPI: Tamara Campbell is a 81 y.o. female with history of HTN, DM, CVA 2005 with mild right sided weakness, HB with PPM, medication non-compliance who was admitted on 01/08/15 with left sided weakness and brief staring episode with gaze to right reported by EMS. CT without acute changes and she was treated with tPA. CTA head/neck with development of cytotoxic edema right posterior frontal precentral gyrus consistent with a small distal embolic infarction and atherosclerotic changes posterior circulation with potential distal R-VA and mid basilar stenosis. 2 D echo with EF 25% with severe biatrial enlargement and mild to moderate MR. Cardiology consulted for input on A fib noted on telemetery as well as new severe LV dysfunction. PPM interrogated and showed A fib with high ventricular rate starting in mid June. Anticoagulation recommended due to A fib. Coreg and lisinopril added for LVD and patient to follow up with [facility] cardiology past discharge. Dr. Pearlean Brownie feels R-MCA infarct likely emoblic and patient to start Eliquis today. Patient with resultant left sided weakness with posterior lean and  cognitive deficits affecting mobility and self care tasks. CIR recommended by MD and rehab team.     Review of Systems  Constitutional: Positive for malaise/fatigue.  Eyes: Negative for blurred vision.  Respiratory: Negative for cough and shortness of breath.   Cardiovascular: Negative for chest pain and palpitations.  Gastrointestinal: Positive for constipation. Negative for heartburn and nausea.  Genitourinary: Positive for frequency.  Musculoskeletal: Positive for falls. Negative for myalgias.  Neurological: Positive for focal weakness. Negative for dizziness, tingling and headaches.  Psychiatric/Behavioral: Positive for memory loss. The patient does not  have insomnia.       Past Medical History  Diagnosis Date  . Diabetes mellitus without complication   . Hypertension   . Stroke   . CHF (congestive heart failure)     Past Surgical History  Procedure Laterality Date  . Joint replacement    . Abdominal hysterectomy    . Pacemaker placement      History reviewed. No pertinent family history.    Social History:  Lives with son. Independent with [employer] P[employer]. Used to work in a [employer].  Son does housework and meal prep. Per reports that she has quit smoking. She has never used smokeless tobacco. She reports that she does not drink alcohol or use illicit drugs.    Allergies: No Known Allergies    Medications Prior to Admission  Medication Sig Dispense Refill  . amLODipine-benazepril (LOTREL) 10-20 MG per capsule Take 1 capsule by mouth daily.    Marland Kitchen atenolol (TENORMIN) 50 MG tablet Take 50 mg by mouth daily.    . bimatoprost (LUMIGAN) 0.01 % SOLN Place 1 drop into both eyes at bedtime.    . brinzolamide (AZOPT) 1 % ophthalmic suspension Place 1 drop into both eyes 2 (two) times daily.    . cholecalciferol (VITAMIN D) 1000 UNITS tablet Take 2,000 Units by mouth daily.    . ferrous sulfate 325 (65 FE) MG tablet Take 325 mg by mouth daily with breakfast.    . hydrochlorothiazide (HYDRODIURIL) 25 MG tablet Take 25 mg by mouth daily.    . metFORMIN (GLUCOPHAGE) 500 MG tablet Take 500 mg by mouth 2 (two) times daily with a meal.    . potassium chloride (K-DUR,KLOR-CON) 10 MEQ tablet Take 10 mEq by mouth  daily.      Home: Home Living Family/patient expects to be discharged to:: Private residence Living Arrangements: Children (son--Anthony) Available Help at Discharge: Family, Available 24 hours/day Type of Home: House Home Access: Ramped entrance Home Layout: One level Bathroom Shower/Tub: Health visitor: Standard Home Equipment: Environmental consultant - 2 wheels, Medical laboratory scientific officer - single point, Information systems manager - built in, [employer] - tub/shower   Lives With: Son  Functional History: Prior Function Level of Independence: Independent with assistive device(s) Comments: Driving PTA. Uses cane and RW. Functional Status:  Mobility: Bed Mobility Overal bed mobility: Needs Assistance Bed Mobility: Supine to Sit Supine to sit: Min assist, HOB elevated General bed mobility comments: pt in chair Transfers Overall transfer level: Needs assistance Equipment used: Rolling Trine (2 wheeled) Transfers: Sit to/from Stand, Stand Pivot Transfers Sit to Stand: Min assist Stand pivot transfers: Min assist General transfer comment: min assist to rise and assist with shifting weight anterior over feet, assist to place L UE  Ambulation/Gait Ambulation/Gait assistance: Min assist, +2 safety/equipment Ambulation Distance (Feet): 15 Feet Assistive device: Rolling [place]olato (2 wheeled) Gait Pattern/deviations: Step-through pattern, Decreased stride length, Leaning posteriorly, Narrow base of support General Gait Details: Min assist for balance and to initiate continuous stepping. Required assist to grip RW with LUE. VC for larger steps, and upright posture. Leans to posterior and needs assistance to manage RW. Gait velocity: slow Gait velocity interpretation: Below normal speed for age/gender    ADL: ADL Overall ADL's : Needs assistance/impaired Eating/Feeding: Minimal assistance, Sitting Eating/Feeding Details (indicate cue type and reason): spontaneously used L UE to stabilize cup and bowl, but not effectively. Grooming: Wash/dry hands, Wash/dry face, Sitting, Minimal assistance Upper Body Bathing: Moderate assistance, Sitting Lower Body Bathing: Maximal assistance, Sit to/from stand Upper Body Dressing : Moderate assistance, Sitting Lower Body Dressing: Maximal assistance, Sit to/from stand Toilet Transfer: Minimal assistance, Stand-pivot, Ambulation, BSC, RW Toileting- Clothing Manipulation and Hygiene: Maximal assistance, Sit to/from  stand  Cognition: Cognition Overall Cognitive Status: Impaired/Different from baseline Arousal/Alertness: Awake/alert Orientation Level: Oriented to person, Oriented to time Attention: Selective Selective Attention: Appears intact Memory: Impaired Memory Impairment: Retrieval deficit, Decreased short term memory Decreased Short Term Memory: Verbal basic (recalls events from the day) Awareness: Appears intact Cognition Arousal/Alertness: Awake/alert Behavior During Therapy: WFL for tasks assessed/performed Overall Cognitive Status: Impaired/Different from baseline Area of Impairment: Orientation, Attention, Memory, Safety/judgement, Problem solving Orientation Level: Disoriented to, Time, Situation, Place Current Attention Level: Sustained Memory: Decreased short-term memory Safety/Judgement: Decreased awareness of safety, Decreased awareness of deficits Problem Solving: Slow processing, Decreased initiation, Difficulty sequencing, Requires verbal cues, Requires tactile cues   Blood pressure 127/75, pulse 72, temperature 98 F (36.7 C), temperature source Oral, resp. rate 20, height 5\' 3"  (1.6 m), weight 64.91 kg (143 lb 1.6 oz), SpO2 99 %. Physical Exam  Nursing note and vitals reviewed. Constitutional: She appears well-developed and well-nourished. She is sleeping. She is easily aroused.  HENT:  Head: Normocephalic and atraumatic.  Eyes: Conjunctivae are normal. Pupils are equal, round, and reactive to light.  Neck: Normal range of motion. Neck supple.  Cardiovascular: Normal rate.  An irregularly irregular rhythm present.  Respiratory: Effort normal and breath sounds normal. No respiratory distress. She has no wheezes.  GI: Soft. Bowel sounds are normal. She exhibits no distension. There is no tenderness.  Musculoskeletal: She exhibits edema. She exhibits no tenderness.  Bilateral shins with stasis changes and foam dressing.   Neurological: She is alert and easily aroused.  Alert once aroused. Listing to the left with poor postural awareness. Oriented to self and situation with cues. Impaired memory and could not recall place, age or month. Has poor insight without awareness of current deficits. She is able to follow simple one and two step motor commands. Left sided weakness noted.   Skin: Skin is warm and dry.  Psychiatric: Her speech is delayed. She is slowed. Cognition and memory are impaired.  Motor strength is 3 minus in the left deltoid, biceps, triceps, grip, 4 minus in the left hip flexor, knee extensor, ankle dorsal flexor plantar flexion Right side is 5/5 in the hip flexor and extensor ankle dorsiflexor, deltoid bicep tricep grip Sensation intact to light touch in bilateral upper and lower extremity There is no evidence of left  neglect on confrontation testing  Results for orders placed or performed during the hospital encounter of 01/08/15 (from the past 24 hour(s))  Glucose, capillary     Status: Abnormal   Collection Time: 01/11/15 11:30 AM  Result Value Ref Range   Glucose-Capillary 159 (H) 65 - 99 mg/dL  Glucose, capillary     Status: None   Collection Time: 01/11/15  4:23 PM  Result Value Ref Range   Glucose-Capillary 95 65 - 99 mg/dL  Glucose, capillary     Status: Abnormal   Collection Time: 01/11/15  9:10 PM  Result Value Ref Range   Glucose-Capillary 145 (H) 65 - 99 mg/dL   Comment 1 Notify RN    Comment 2 Document in Chart   Urine rapid drug screen (hosp performed)not at [facility]     Status: None   Collection Time: 01/11/15 11:38 PM  Result Value Ref Range   Opiates NONE DETECTED NONE DETECTED   Cocaine NONE DETECTED NONE DETECTED   Benzodiazepines NONE DETECTED NONE DETECTED   Amphetamines NONE DETECTED NONE DETECTED   Tetrahydrocannabinol NONE DETECTED NONE DETECTED   Barbiturates NONE DETECTED NONE DETECTED  Urinalysis, Routine w reflex microscopic (not at [facility])     Status: Abnormal   Collection Time: 01/11/15 11:38 PM  Result  Value Ref Range   Color, Urine YELLOW YELLOW   APPearance CLOUDY (A) CLEAR   Specific Gravity, Urine 1.012 1.005 - 1.030   pH 5.0 5.0 - 8.0   Glucose, UA NEGATIVE NEGATIVE mg/dL   Hgb urine dipstick NEGATIVE NEGATIVE   Bilirubin Urine NEGATIVE NEGATIVE   Ketones, ur NEGATIVE NEGATIVE mg/dL   Protein, ur NEGATIVE NEGATIVE mg/dL   Urobilinogen, UA 0.2 0.0 - 1.0 mg/dL   Nitrite NEGATIVE NEGATIVE   Leukocytes, UA NEGATIVE NEGATIVE  Glucose, capillary     Status: Abnormal   Collection Time: 01/12/15  6:43 AM  Result Value Ref Range   Glucose-Capillary 143 (H) 65 - 99 mg/dL   Comment 1 Notify RN    Comment 2 Document in Chart   Basic metabolic panel     Status: Abnormal   Collection Time: 01/12/15  7:44 AM  Result Value Ref Range   Sodium 138 135 - 145 mmol/L   Potassium 4.4 3.5 - 5.1 mmol/L   Chloride 107 101 - 111 mmol/L   CO2 22 22 - 32 mmol/L   Glucose, Bld 131 (H) 65 - 99 mg/dL   BUN 26 (H) 6 - 20 mg/dL   Creatinine, Ser 1.61 (H) 0.44 - 1.00 mg/dL   Calcium 8.6 (L) 8.9 - 10.3 mg/dL   GFR calc non Af Amer 49 (L) >60 mL/min   GFR calc Af Amer 56 (L) >  60 mL/min   Anion gap 9 5 - 15   No results found.  Assessment/Plan: Diagnosis: Embolic right MCA distribution infarct with left hemiparesis upper extremity graded lower extremity 1. Does the need for close, 24 hr/day medical supervision in concert with the patient's rehab needs make it unreasonable for this patient to be served in a less intensive setting? Yes 2. Co-Morbidities requiring supervision/potential complications: Diabetes, prior CVA with history of mild right hemiparesis in the past 3. Due to bladder management, bowel management, safety, skin/wound care, disease management, medication administration, pain management and patient education, does the patient require 24 hr/day rehab nursing? Yes 4. Does the patient require coordinated care of a physician, rehab nurse, PT (1-2 hrs/day, 5 days/week), OT (1-2 hrs/day, 5  days/week) and SLP (0.5-1 hrs/day, 5 days/week) to address physical and functional deficits in the context of the above medical diagnosis(es)? Yes Addressing deficits in the following areas: balance, endurance, locomotion, strength, transferring, bowel/bladder control, bathing, dressing, feeding, grooming, toileting and cognition 5. Can the patient actively participate in an intensive therapy program of at least 3 hrs of therapy per day at least 5 days per week? Yes 6. The potential for patient to make measurable gains while on inpatient rehab is good 7. Anticipated functional outcomes upon discharge from inpatient rehab are supervision  with PT, supervision with OT, supervision with SLP. 8. Estimated rehab length of stay to reach the above functional goals is: 10-14 days 9. Does the patient have adequate social supports and living environment to accommodate these discharge functional goals? Yes 10. Anticipated D/C setting: Home 11. Anticipated post D/C treatments: HH therapy 12. Overall Rehab/Functional Prognosis: good  RECOMMENDATIONS: This patient's condition is appropriate for continued rehabilitative care in the following setting: CIR Patient has agreed to participate in recommended program. Yes Note that insurance prior authorization may be required for reimbursement for recommended care.  Comment:     01/12/2015

## 2015-01-12 NOTE — Progress Notes (Signed)
Speech Language Pathology Treatment: Cognitive-Linquistic  Patient Details Name: Tamara Campbell MRN: [id number] DOB: [birth year] Today's Date: 01/12/2015 Time: 1520-1540 SLP Time Calculation (min) (ACUTE ONLY): 20 min  Assessment / Plan / Recommendation Clinical Impression  Pt oriented x4 with visual/auditory cues provided by SLP, along with environmental cues (ie: calendar, white board); pt independently viewed white board after initial cue given by SLP for date; she has residual short-term memory deficits prior to hospitalization for CVA, but this has exacerbated these symptoms as word recall was 65% accuracy and simple paragraph recall 75% accurate; awareness of deficits affected with left weakness and safety re: bathroom, ADL's, etc. with reminders needed for assistance during these tasks.  Pt can verbalize need, but implementation of safety precautions may be a concern at D/C; inpatient rehab may be best option for pt until safety issues are resolved vs. HH or OP ST at this time.   HPI Other Pertinent Information: 81 y.o. female with a history of diabetes and hypertension who presents with new onset left hemiparesis and left neglect.  tPA administered.  Lives with son; ambulates with Prosch; mild residual right hemiparesis s/p '05 CVA.    Pertinent Vitals Pain Assessment: No/denies pain Faces Pain Scale: Hurts even more Pain Location: left hip Pain Intervention(s): Limited activity within patient's tolerance;Monitored during session;Repositioned  SLP Plan  Continue with current plan of care    Recommendations  May consider CIR d/t safety issues              Follow up Recommendations: CIR vs HH or OP ST;Other (comment) (per family request for next venue of care) Plan: Continue with current plan of care         ADAMS,PAT, M.S., CCC-SLP 01/12/2015, 3:55 PM

## 2015-01-13 DIAGNOSIS — I10 Essential (primary) hypertension: Secondary | ICD-10-CM | POA: Diagnosis present

## 2015-01-13 DIAGNOSIS — I481 Persistent atrial fibrillation: Secondary | ICD-10-CM | POA: Diagnosis not present

## 2015-01-13 DIAGNOSIS — E876 Hypokalemia: Secondary | ICD-10-CM | POA: Diagnosis present

## 2015-01-13 DIAGNOSIS — I4729 Other ventricular tachycardia: Secondary | ICD-10-CM

## 2015-01-13 DIAGNOSIS — I6990 Unspecified sequelae of unspecified cerebrovascular disease: Secondary | ICD-10-CM | POA: Diagnosis not present

## 2015-01-13 DIAGNOSIS — E1142 Type 2 diabetes mellitus with diabetic polyneuropathy: Secondary | ICD-10-CM

## 2015-01-13 DIAGNOSIS — I4891 Unspecified atrial fibrillation: Secondary | ICD-10-CM | POA: Diagnosis not present

## 2015-01-13 DIAGNOSIS — I6340 Cerebral infarction due to embolism of unspecified cerebral artery: Secondary | ICD-10-CM | POA: Diagnosis not present

## 2015-01-13 DIAGNOSIS — S81802A Unspecified open wound, left lower leg, initial encounter: Secondary | ICD-10-CM | POA: Diagnosis present

## 2015-01-13 DIAGNOSIS — I639 Cerebral infarction, unspecified: Secondary | ICD-10-CM | POA: Diagnosis not present

## 2015-01-13 DIAGNOSIS — I872 Venous insufficiency (chronic) (peripheral): Secondary | ICD-10-CM | POA: Diagnosis not present

## 2015-01-13 DIAGNOSIS — E785 Hyperlipidemia, unspecified: Secondary | ICD-10-CM | POA: Diagnosis present

## 2015-01-13 DIAGNOSIS — I63419 Cerebral infarction due to embolism of unspecified middle cerebral artery: Secondary | ICD-10-CM | POA: Insufficient documentation

## 2015-01-13 DIAGNOSIS — E119 Type 2 diabetes mellitus without complications: Secondary | ICD-10-CM | POA: Diagnosis not present

## 2015-01-13 DIAGNOSIS — I480 Paroxysmal atrial fibrillation: Secondary | ICD-10-CM | POA: Diagnosis not present

## 2015-01-13 DIAGNOSIS — H409 Unspecified glaucoma: Secondary | ICD-10-CM | POA: Diagnosis not present

## 2015-01-13 DIAGNOSIS — I69354 Hemiplegia and hemiparesis following cerebral infarction affecting left non-dominant side: Secondary | ICD-10-CM | POA: Diagnosis not present

## 2015-01-13 DIAGNOSIS — I5020 Unspecified systolic (congestive) heart failure: Secondary | ICD-10-CM | POA: Diagnosis not present

## 2015-01-13 DIAGNOSIS — M6281 Muscle weakness (generalized): Secondary | ICD-10-CM | POA: Diagnosis not present

## 2015-01-13 DIAGNOSIS — E784 Other hyperlipidemia: Secondary | ICD-10-CM | POA: Diagnosis not present

## 2015-01-13 DIAGNOSIS — I472 Ventricular tachycardia: Secondary | ICD-10-CM

## 2015-01-13 DIAGNOSIS — R262 Difficulty in walking, not elsewhere classified: Secondary | ICD-10-CM | POA: Diagnosis not present

## 2015-01-13 LAB — GLUCOSE, CAPILLARY
Glucose-Capillary: 109 mg/dL — ABNORMAL HIGH (ref 65–99)
Glucose-Capillary: 137 mg/dL — ABNORMAL HIGH (ref 65–99)

## 2015-01-13 MED ORDER — APIXABAN 5 MG PO TABS: 5.0000 mg | ORAL_TABLET | Freq: Two times a day (BID) | ORAL | Status: DC

## 2015-01-13 MED ORDER — LISINOPRIL 5 MG PO TABS: 5.0000 mg | ORAL_TABLET | Freq: Every day | ORAL | Status: DC

## 2015-01-13 MED ORDER — BACITRACIN-NEOMYCIN-POLYMYXIN OINTMENT TUBE
1.0000 | TOPICAL_OINTMENT | Freq: Every day | CUTANEOUS | Status: DC
Start: 2015-01-13 — End: 2017-01-12

## 2015-01-13 MED ORDER — CARVEDILOL 6.25 MG PO TABS: 6.2500 mg | ORAL_TABLET | Freq: Two times a day (BID) | ORAL | Status: DC

## 2015-01-13 MED ORDER — ATORVASTATIN CALCIUM 20 MG PO TABS: 20.0000 mg | ORAL_TABLET | Freq: Every day | ORAL | Status: DC

## 2015-01-13 NOTE — Progress Notes (Signed)
Rehab admissions - I met with pt in follow up to rehab consult and pt had confusion during my visit. She was not able to follow my discussion explaining that we have no available rehab beds today and limited beds tomorrow. Pt was comfortable with me reaching out to her family.  I then called pt's dtr [name] and shared the update that we have no available rehab beds today. I explained that social worker would be following up with them for discharge planning to skilled nursing. Dtr preferred to find a SNF closer to the [place] area.  I have updated Loma Sousa, case Freight forwarder and Dysheka, Education officer, museum. Case manager shared pt is planned for DC today per Dr. Leonie Man. We will now sign off pt's case.  Thanks.  Nanetta Batty, PT Rehabilitation Admissions Coordinator [phone]

## 2015-01-13 NOTE — Discharge Summary (Signed)
Stroke Discharge Summary  Patient ID: Tamara Campbell   MRN: [id number]      DOB: [birth year]  Date of Admission: 01/08/2015 Date of Discharge: 01/13/2015  Attending Physician:  Micki Riley, MD, Stroke MD  Consulting Physician(s):     Cammie Mcgee, RN (WOC), Nada Boozer, NP (Cardiology), Claudette Laws, MD (Physical Medicine & Rehabtilitation)  Patient's PCP:  No primary care provider on file.  DISCHARGE DIAGNOSIS:  Principal Problem:   CVA (cerebral infarction) - right posterior prefrontal gyrus infarct, embolic due to confirmed atrial fibrillation s/p IV tPA Active Problems:   Atrial fibrillation   Cardiomyopathy   Essential hypertension   Hyperlipidemia LDL goal <70   Diabetes type 2, controlled   Paroxysmal ventricular tachycardia   Hypokalemia, resolved   Open wound of left lower leg, chronic   Past Medical History  Diagnosis Date  . Diabetes mellitus without complication   . Hypertension   . Stroke   . CHF (congestive heart failure)    Past Surgical History  Procedure Laterality Date  . Joint replacement    . Abdominal hysterectomy    . Pacemaker placement        Medication List    STOP taking these medications        amLODipine-benazepril 10-20 MG per capsule  Commonly known as:  LOTREL     atenolol 50 MG tablet  Commonly known as:  TENORMIN      TAKE these medications        apixaban 5 MG Tabs tablet  Commonly known as:  ELIQUIS  Take 1 tablet (5 mg total) by mouth 2 (two) times daily.     atorvastatin 20 MG tablet  Commonly known as:  LIPITOR  Take 1 tablet (20 mg total) by mouth daily at 6 PM.     bimatoprost 0.01 % Soln  Commonly known as:  LUMIGAN  Place 1 drop into both eyes at bedtime.     brinzolamide 1 % ophthalmic suspension  Commonly known as:  AZOPT  Place 1 drop into both eyes 2 (two) times daily.     carvedilol 6.25 MG tablet  Commonly known as:  COREG  Take 1 tablet (6.25 mg total) by mouth 2 (two) times daily with a  meal.     cholecalciferol 1000 UNITS tablet  Commonly known as:  VITAMIN D  Take 2,000 Units by mouth daily.     ferrous sulfate 325 (65 FE) MG tablet  Take 325 mg by mouth daily with breakfast.     hydrochlorothiazide 25 MG tablet  Commonly known as:  HYDRODIURIL  Take 25 mg by mouth daily.     lisinopril 5 MG tablet  Commonly known as:  PRINIVIL,ZESTRIL  Take 1 tablet (5 mg total) by mouth daily.     metFORMIN 500 MG tablet  Commonly known as:  GLUCOPHAGE  Take 500 mg by mouth 2 (two) times daily with a meal.     neomycin-bacitracin-polymyxin Oint  Commonly known as:  NEOSPORIN  Apply 1 application topically daily. Apply Neosporin to left leg wound Q day, then cover with foam dressing.  Change foam dressing Q 5 days or PRN soiling.     potassium chloride 10 MEQ tablet  Commonly known as:  K-DUR,KLOR-CON  Take 10 mEq by mouth daily.        LABORATORY STUDIES CBC    Component Value Date/Time   WBC 5.0 01/11/2015 0830   RBC 5.07 01/11/2015 0830  HGB 14.0 01/11/2015 0830   HCT 42.2 01/11/2015 0830   PLT 191 01/11/2015 0830   MCV 83.2 01/11/2015 0830   MCH 27.6 01/11/2015 0830   MCHC 33.2 01/11/2015 0830   RDW 15.2 01/11/2015 0830   LYMPHSABS 1.1 01/08/2015 2131   MONOABS 0.2 01/08/2015 2131   EOSABS 0.1 01/08/2015 2131   BASOSABS 0.0 01/08/2015 2131   CMP    Component Value Date/Time   NA 138 01/12/2015 0744   K 4.4 01/12/2015 0744   CL 107 01/12/2015 0744   CO2 22 01/12/2015 0744   GLUCOSE 131* 01/12/2015 0744   BUN 26* 01/12/2015 0744   CREATININE 1.04* 01/12/2015 0744   CALCIUM 8.6* 01/12/2015 0744   PROT 7.0 01/08/2015 2131   ALBUMIN 3.8 01/08/2015 2131   AST 28 01/08/2015 2131   ALT 20 01/08/2015 2131   ALKPHOS 90 01/08/2015 2131   BILITOT 0.7 01/08/2015 2131   GFRNONAA 49* 01/12/2015 0744   GFRAA 56* 01/12/2015 0744   COAGS Lab Results  Component Value Date   INR 1.12 01/08/2015   Lipid Panel    Component Value Date/Time   CHOL 167  01/09/2015 0800   TRIG 47 01/09/2015 0800   HDL 58 01/09/2015 0800   CHOLHDL 2.9 01/09/2015 0800   VLDL 9 01/09/2015 0800   LDLCALC 100* 01/09/2015 0800   HgbA1C  Lab Results  Component Value Date   HGBA1C 6.3* 01/09/2015   Cardiac Panel (last 3 results) No results for input(s): CKTOTAL, CKMB, TROPONINI, RELINDX in the last 72 hours. Urinalysis    Component Value Date/Time   COLORURINE YELLOW 01/11/2015 2338   APPEARANCEUR CLOUDY* 01/11/2015 2338   LABSPEC 1.012 01/11/2015 2338   PHURINE 5.0 01/11/2015 2338   GLUCOSEU NEGATIVE 01/11/2015 2338   HGBUR NEGATIVE 01/11/2015 2338   BILIRUBINUR NEGATIVE 01/11/2015 2338   KETONESUR NEGATIVE 01/11/2015 2338   PROTEINUR NEGATIVE 01/11/2015 2338   UROBILINOGEN 0.2 01/11/2015 2338   NITRITE NEGATIVE 01/11/2015 2338   LEUKOCYTESUR NEGATIVE 01/11/2015 2338   Urine Drug Screen     Component Value Date/Time   LABOPIA NONE DETECTED 01/11/2015 2338   COCAINSCRNUR NONE DETECTED 01/11/2015 2338   LABBENZ NONE DETECTED 01/11/2015 2338   AMPHETMU NONE DETECTED 01/11/2015 2338   THCU NONE DETECTED 01/11/2015 2338   LABBARB NONE DETECTED 01/11/2015 2338    Alcohol Level    Component Value Date/Time   ETH <5 01/08/2015 2131     SIGNIFICANT DIAGNOSTIC STUDIES Ct Head Wo Contrast 01/08/2015    No acute abnormality.  Atrophy and extensive chronic microvascular ischemic change.     Ct Angio Neck W/cm &/or Wo/cm 01/09/2015    Developing cytotoxic edema and the RIGHT posterior frontal precentral gyrus consistent with a small distal embolic infarction.  Intracranial atherosclerotic change affecting predominantly the posterior circulation with distal RIGHT vertebral and mid basilar stenoses potentially flow reducing.  No large vessel anterior circulation occlusion or observable proximal MCA branch occlusion.  Unusual swirling appearance of contrast in the transverse arch and proximal anomaly, without signs of dissection. Favor artifact due to mixing.  If further investigation is desired however, consider CTA chest using dissection protocol.     Dg Chest Port 1 View 01/09/2015   1. Cardiomegaly and diffuse aortic ectasia. 2. The patient's pacemaker lead projects over the left upper quadrant of the abdomen. This may be projectional in this patient with cardiomegaly. However, there are no comparison studies to assess stability. Chest CT should be considered for further evaluation. 3. These  results will be called to the ordering clinician or representative by the Radiologist Assistant, and communication documented in the PACS or zVision Dashboard.     2D Echo  01/09/2015  Normal LV size with prominent trabeculation pattern concerning for LV noncompaction.  EF 25%, diffuse hypokinesis. (new)  Mildly dilated RV with normal systolic function. Severe biatrial enlargement. Mild to moderate MR, severe TR. Mild pulmonary hypertension.  EKG - ? afib in the setting of paced rhythm      HISTORY OF PRESENT ILLNESS Tamara Campbell is an 81 y.o. female with a history of HTN and diabetes who reports noncompliance with her antihypertensives who was at her baseline today 01/08/2015. This evening she sat down to eat supper and noted no problems. When she attempted to get up to leave the table she was unable to support herself and fell. She noted left sided weakness. (LKW 01/08/2015 at 1800). EMS was called at that time. Patient was hypertensive. En route was noted to have a starring spell when she gazed to the right and did not respond to questioning. Patient has had a stroke in the past in 2005. Patient had some mild right sided weakness as residual from this per daughter. At baseline patient lives with son. She ambulates with a [place]. She dresses herself and goes to the bathroom independently. She does not report incontinence but does wear Depends because she reports that she does not always make it to the bathroom. Her son does the cooking and cleaning.IV tPA  was administered. Patient was admitted for further stroke evaluation.    HOSPITAL COURSE Tamara Campbell is a 81 y.o. female with history of hypertension, diabetes mellitus, congestive heart failure, previous stroke, permanent pacemaker presenting with gazing to the right, unresponsiveness, and left hemiparesis. She received IV t-PA 52 mg on 01/08/2015 at 2215.  Stroke: Likely right posterior prefrontal gyrus infarct, embolic due to confirmed atrial fibrillation s/p IV tPA  Resultant LUE weakness  CTA head and neck - Developing cytotoxic edema and the RIGHT posterior frontal precentral gyrus consistent with a small distal embolic infarction.  2D Echo EF 25%. No cardiac source of emboli identified.  LDL 100  HgbA1c 6.3  no antithrombotic prior to admission, on aspirin 325 mg daily post tPA. Changed to Eliquis given atrial fibrillation  Patient counseled to be compliant with her antithrombotic medications  Ongoing aggressive stroke risk factor management  Therapy recommendations: CIR recommended. No bed available.   Disposition: Discharge to skilled nursing facility for ongoing PT, OT and ST.   Atrial fibrillation   cardiology consulted  PPM - [employer] - placed in [place]. Cardiology interrogated 01/09/15  A. fib is new from June - anticoagulation recommended.   Rate is controlled.  Started Eliquis 5 mg bid  Cardiomyopathy  The low ejection fraction of 25% is new from 2012   Meds adjusted by cardiology. Changed BB to Coreg and added ACE inhibitor.  May be from ventricular pacing  She will need further follow-up with her cardiologist at [facility].  Hypertension  Home meds: Amlodipine, atenolol, and hydrochlorothiazide  BP stable  Patient counseled to be compliant with her blood pressure medications  Hyperlipidemia  Home meds: No lipid lowering medications prior to admission  LDL 100, goal < 70  Added Lipitor 20 mg daily  Continue statin at  discharge  Diabetes  HgbA1c 6.3, goal < 7.0  Controlled  Other Stroke Risk Factors  Advanced age  Former cigarette smoker, has quit smoking  Hx stroke/TIA in 2012  Other Active Problems  Hypokalemia - replaced - up to 4.4   Mildly elevated BUN 26, Cr 1.04  Chronic wound left lower extremity. Wound care nurse following.  9 beat run V-Tach, asymptomatic   DISCHARGE EXAM Blood pressure 167/100, pulse 72, temperature 97.4 F (36.3 C), temperature source Oral, resp. rate 20, height 5\' 3"  (1.6 m), weight 64.91 kg (143 lb 1.6 oz), SpO2 98 %. General - Well nourished, well developed, in no apparent distress.  Ophthalmologic - Fundi not visualized due to noncooperation.  Cardiovascular - Regular rate and rhythm, paced.  Mental Status -  Level of arousal and orientation to time, place, and person were intact. Language including expression, naming, repetition, comprehension was assessed and found intact.  Cranial Nerves II - XII - II - Visual field intact OU. III, IV, VI - Extraocular movements intact. V - Facial sensation intact bilaterally. VII - Facial movement intact bilaterally. VIII - Hearing & vestibular intact bilaterally. X - Palate elevates symmetrically. XI - Chin turning & shoulder shrug intact bilaterally. XII - Tongue protrusion intact.  Motor Strength - The patient's strength was normal in all extremities except LUE 3/5 with pronator drift.marked left hand and grip weakness. Bulk was normal and fasciculations were absent.  Motor Tone - Muscle tone was assessed at the neck and appendages and was normal.  Reflexes - The patient's reflexes were 1+ in all extremities and she had no pathological reflexes.  Sensory - Light touch, temperature/pinprick were assessed and were symmetrical.   Coordination - The patient had normal movements in the right hand and feet with no ataxia or dysmetria. Tremor was absent.  Gait and Station - deferred due to within 24  hours of TPA.   Discharge Diet   Diet Carb Modified Fluid consistency:: Thin; Room service appropriate?: Yes liquids  DISCHARGE PLAN  Disposition:  Discharge to skilled nursing facility for ongoing PT, OT and ST.    eliquis (apixaban) for secondary stroke prevention.  [facility]ologist at [facility] for newly diagnosed low EF & atrial fibrillation  Follow-up No primary care provider on file. in 2 weeks.  Follow-up with Dr. Delia Heady, Stroke Clinic in 2 months.  45 minutes were spent preparing discharge.  Rhoderick Moody [facility] See Amion for Pager information 01/13/2015 12:42 PM   I have personally examined this patient, reviewed notes, independently viewed imaging studies, participated in medical decision making and plan of care. I have made any additions or clarifications directly to the above note. Agree with note above. Follow-up as an outpatient in stroke clinic in 2 months  Delia Heady, MD Medical Director [facility] Pager: [phone] 01/13/2015 1:04 PM

## 2015-01-13 NOTE — Progress Notes (Signed)
Discharge orders received, pt for discharge today to SNF.  IV and telemetry D/C.  D/C instructions and Rx in packet for receiving facility and report called to SNF.  Family at the bedside to assist with discharge. Staff brought pt downstairs via wheelchair.

## 2015-01-13 NOTE — Clinical Social Work Placement (Signed)
   CLINICAL SOCIAL WORK PLACEMENT  NOTE  Date:  01/13/2015  Patient Details  Name: Tamara Campbell MRN: [id number] Date of Birth: 1931-05-04  Clinical Social Work is seeking post-discharge placement for this patient at the Skilled  Nursing Facility level of care (*CSW will initial, date and re-position this form in  chart as items are completed):  Yes   Patient/family provided with [place] Clinical Social Work Department's list of facilities offering this level of care within the geographic area requested by the patient (or if unable, by the patient's family).  Yes   Patient/family informed of their freedom to choose among providers that offer the needed level of care, that participate in Medicare, Medicaid or managed care program needed by the patient, have an available bed and are willing to accept the patient.  Yes   Patient/family informed of [facility]'s ownership interest in [facility] and [facility], as well as of the fact that they are under no obligation to receive care at these facilities.  PASRR submitted to EDS on       PASRR number received on       Existing PASRR number confirmed on 01/13/15     FL2 transmitted to all facilities in geographic area requested by pt/family on 01/13/15     FL2 transmitted to all facilities within larger geographic area on       Patient informed that his/her managed care company has contracts with or will negotiate with certain facilities, including the following:        Yes   Patient/family informed of bed offers received.  Patient chooses bed at [facility]     Physician recommends and patient chooses bed at      Patient to be transferred to [facility] on 01/13/15.  Patient to be transferred to facility by Lonell Grandchild, person car     Patient family notified on 01/13/15 of transfer.  Name of family member notified:  Lonell Grandchild, daugther      PHYSICIAN       Additional Comment:     _______________________________________________ Gwynne Edinger, LCSW 01/13/2015, 11:59 AM

## 2015-01-13 NOTE — Clinical Social Work Note (Addendum)
Clinical Social Work Assessment  Patient Details  Name: Tamara Campbell MRN: [id number] Date of Birth: 1930-08-02  Date of referral:  01/13/15               Reason for consult:  Facility Placement                Permission sought to share information with:  Family Supports Permission granted to share information::  Yes, Verbal Permission Granted  Name::     [name]   Relationship::  daughter   Housing/Transportation Living arrangements for the past 2 months:  Single Family Home Source of Information:  Adult Children Patient Interpreter Needed:  None Criminal Activity/Legal Involvement Pertinent to Current Situation/Hospitalization:  No - Comment as needed Significant Relationships:  Adult Children Lives with:  Self Do you feel safe going back to the place where you live?  No Need for family participation in patient care:  Yes (Comment)  Care giving concerns:  N/A   Facilities manager / plan:  CSW met the pt (then call the pt's daughter [name]) at bedside. CSW introduced self and purpose of the visit/call. CSW discussed SNF rehab. CSW explained the SNF process. CSW explained insurance and its relation to SNF placement. [name] reported that she would like the pt to go to [facility]. CSW contacted insurance, so authorization can be started. Pt has a bed at [facility]. [name] reported that she will be here at 2pm to pick the pt up. CSW answered all questions in which [name] inquired about. CSW will continue to follow this pt and assist with discharge as needed.   Employment status:  Retired Nurse, adult PT Recommendations:  Inpatient [place] / Referral to community resources:  [place]  Patient/Family's Response to care: [name] reported that the pt was well cared for.   Patient/Family's Understanding of and Emotional Response to Diagnosis, Current Treatment, and Prognosis:  [name]  acknowledged the pt's current diagnosis. [name] reported that give her current condition she feels it is best for the pt to receive rehab prior to coming home.   Emotional Assessment Appearance:  Appears stated age Attitude/Demeanor/Rapport:   (Calm ) Affect (typically observed):  Accepting Orientation:  Oriented to Self, Oriented to Place, Oriented to  Time, Oriented to Situation Alcohol / Substance use:  Not Applicable Psych involvement (Current and /or in the community):  No (Comment)  Discharge Needs  Concerns to be addressed:  Denies Needs/Concerns at this time Readmission within the last 30 days:  No Current discharge risk:  None Barriers to Discharge:  Barriers Resolved, No Barriers Identified   Dina Mobley, LCSW 01/13/2015, 11:53 AM

## 2015-01-13 NOTE — Assessment & Plan Note (Addendum)
01/2015 right posterior prefrontal gyrus infarct, embolic due to confirmed atrial fibrillation s/p IV tPA

## 2015-01-13 NOTE — Progress Notes (Signed)
Physical Therapy Treatment Patient Details Name: Tamara Campbell MRN: [id number] DOB: 1930/06/07 Today's Date: 01/13/2015    History of Present Illness 81 y.o. female admitted for Stroke and afib.    PT Comments    Pt is progressing toward PT goals. Pt demo increased ambulation tolerance today. Pt still relies heavily on weight bearing through UE on RW for balance and stability and requires max VC to clear LLE in swing phase of gait. Transitioned to one person HHA and pt demo increased unsteadiness, narrower BOS and max cues for clearing L LE. Mod A need for pt safety. DC destination (SNF) is appropriate for pt to improve functional strength and mobility for a safe transition home.  Follow Up Recommendations  SNF     Equipment Recommendations  None recommended by PT    Recommendations for Other Services Rehab consult;OT consult     Precautions / Restrictions Precautions Precautions: Fall Restrictions Weight Bearing Restrictions: No    Mobility  Bed Mobility               General bed mobility comments: pt in chair  Transfers Overall transfer level: Needs assistance Equipment used: Rolling [place] (2 wheeled) Transfers: Sit to/from Stand Sit to Stand: Mod assist         General transfer comment: vc for hand placement on chair, pt leans posteriorly upon standing mod A for safety  Ambulation/Gait Ambulation/Gait assistance: Min assist;Mod assist Ambulation Distance (Feet): 60 Feet Assistive device: Rolling [place] (2 wheeled);1 person hand held assist Gait Pattern/deviations: Step-to pattern;Decreased stride length;Narrow base of support Gait velocity: slow Gait velocity interpretation: Below normal speed for age/gender General Gait Details: pt able to ambulate with RW with min a from therapist, vc for increase L hip flex to in swing phase to clear L foot when walking. pt relies heavily on pushing through UE on RW for balance and stabilty, transitioned to one person  HHA , pt demo increased unsteadiness, decreased stride length and had an increaed difficulty clearing L LE, max vc for lifting L LE, mod A for safety during HHA amublation.   Stairs            Wheelchair Mobility    Modified Rankin (Stroke Patients Only) Modified Rankin (Stroke Patients Only) Pre-Morbid Rankin Score: Moderate disability Modified Rankin: Moderately severe disability     Balance Overall balance assessment: Needs assistance Sitting-balance support: No upper extremity supported;Feet supported Sitting balance-Leahy Scale: Fair     Standing balance support: Bilateral upper extremity supported;Single extremity supported;During functional activity Standing balance-Leahy Scale: Poor Standing balance comment: pt needs an AD to maintain standing balance                    Cognition Arousal/Alertness: Awake/alert Behavior During Therapy: WFL for tasks assessed/performed Overall Cognitive Status: Within Functional Limits for tasks assessed                      Exercises      General Comments        Pertinent Vitals/Pain Pain Assessment: No/denies pain    Home Living                      Prior Function            PT Goals (current goals can now be found in the care plan section) Acute Rehab PT Goals Patient Stated Goal: none stated during therapy session Progress towards PT goals: Progressing toward goals  Frequency  Min 4X/week    PT Plan Discharge plan needs to be updated    Co-evaluation             End of Session Equipment Utilized During Treatment: Gait belt Activity Tolerance: Patient tolerated treatment well Patient left: in chair;with call bell/phone within reach     Time: 1412-1433 PT Time Calculation (min) (ACUTE ONLY): 21 min  Charges:                       G Codes:      Carren Rang 2015/02/09, 3:44 PM   Dorrene German (student physical therapy assistant) Acute Rehab [phone]

## 2015-01-16 DIAGNOSIS — I4891 Unspecified atrial fibrillation: Secondary | ICD-10-CM | POA: Diagnosis not present

## 2015-01-16 DIAGNOSIS — I6990 Unspecified sequelae of unspecified cerebrovascular disease: Secondary | ICD-10-CM | POA: Diagnosis not present

## 2015-01-16 DIAGNOSIS — I5020 Unspecified systolic (congestive) heart failure: Secondary | ICD-10-CM | POA: Diagnosis not present

## 2015-01-16 DIAGNOSIS — E119 Type 2 diabetes mellitus without complications: Secondary | ICD-10-CM | POA: Diagnosis not present

## 2015-01-18 NOTE — ED Provider Notes (Signed)
CSN: [id number]     Arrival date & time 01/08/15  2124 History   First MD Initiated Contact with Patient 01/08/15 2125     Chief Complaint  Patient presents with  . Code Stroke  . Extremity Weakness     (Consider location/radiation/quality/duration/timing/severity/associated sxs/prior Treatment) HPI   81 year old female presenting with left-sided weakness. Symptom onset shortly before arrival after finishing dinner. Patient went to get up from the table when she fell. Noted to have left-sided weakness and EMS was called. En route to the hospital patient apparently had short episode where she didn't respond verbally seen to be staring to her right side. This has since resolved.  Patient has had a prior stroke. She has baseline right-sided weakness as result of this. She lives with her son and can perform most ADLs. She does walk with [place] at baseline.     Past Medical History  Diagnosis Date  . Diabetes mellitus without complication   . Hypertension   . Stroke   . CHF (congestive heart failure)    Past Surgical History  Procedure Laterality Date  . Joint replacement    . Abdominal hysterectomy    . Pacemaker placement     History reviewed. No pertinent family history. Social History  Substance Use Topics  . Smoking status: Former Games developer  . Smokeless tobacco: Never Used  . Alcohol Use: No   OB History    No data available     Review of Systems    Allergies  Review of patient's allergies indicates no known allergies.  Home Medications   Prior to Admission medications   Medication Sig Start Date End Date Taking? Authorizing Provider  bimatoprost (LUMIGAN) 0.01 % SOLN Place 1 drop into both eyes at bedtime.   Yes Historical Provider, MD  brinzolamide (AZOPT) 1 % ophthalmic suspension Place 1 drop into both eyes 2 (two) times daily.   Yes Historical Provider, MD  cholecalciferol (VITAMIN D) 1000 UNITS tablet Take 2,000 Units by mouth daily.   Yes Historical  Provider, MD  ferrous sulfate 325 (65 FE) MG tablet Take 325 mg by mouth daily with breakfast.   Yes Historical Provider, MD  hydrochlorothiazide (HYDRODIURIL) 25 MG tablet Take 25 mg by mouth daily.   Yes Historical Provider, MD  metFORMIN (GLUCOPHAGE) 500 MG tablet Take 500 mg by mouth 2 (two) times daily with a meal.   Yes Historical Provider, MD  potassium chloride (K-DUR,KLOR-CON) 10 MEQ tablet Take 10 mEq by mouth daily.   Yes Historical Provider, MD  apixaban (ELIQUIS) 5 MG TABS tablet Take 1 tablet (5 mg total) by mouth 2 (two) times daily. 01/13/15   Layne Benton, NP  atorvastatin (LIPITOR) 20 MG tablet Take 1 tablet (20 mg total) by mouth daily at 6 PM. 01/13/15   Layne Benton, NP  carvedilol (COREG) 6.25 MG tablet Take 1 tablet (6.25 mg total) by mouth 2 (two) times daily with a meal. 01/13/15   Layne Benton, NP  lisinopril (PRINIVIL,ZESTRIL) 5 MG tablet Take 1 tablet (5 mg total) by mouth daily. 01/13/15   Layne Benton, NP  neomycin-bacitracin-polymyxin (NEOSPORIN) OINT Apply 1 application topically daily. Apply Neosporin to left leg wound Q day, then cover with foam dressing.  Change foam dressing Q 5 days or PRN soiling. 01/13/15   Layne Benton, NP   BP 156/100 mmHg  Pulse 73  Temp(Src) 97.9 F (36.6 C) (Oral)  Resp 20  Ht 5\' 3"  (1.6 m)  Wt 143  lb 1.6 oz (64.91 kg)  BMI 25.36 kg/m2  SpO2 98% Physical Exam  Constitutional: She appears well-developed and well-nourished. No distress.  HENT:  Head: Normocephalic and atraumatic.  Eyes: Conjunctivae are normal. Right eye exhibits no discharge. Left eye exhibits no discharge.  Neck: Neck supple.  Cardiovascular: Regular rhythm and normal heart sounds.  Exam reveals no gallop and no friction rub.   No murmur heard. Irregularly irregular  Pulmonary/Chest: Effort normal and breath sounds normal. No respiratory distress.  Abdominal: Soft. She exhibits no distension. There is no tenderness.  Musculoskeletal: She exhibits no edema or  tenderness.  Neurological: She is alert.  Left facial droop. Strength is 5 out of 5 right upper extremity. 3 out of 5 left upper extremity. 3 out of 5 right lower extremity. 2 out of 5 left lower extremity. Sensation is intact to light touch.  Skin: Skin is warm and dry.  Psychiatric: She has a normal mood and affect. Her behavior is normal. Thought content normal.  Nursing note and vitals reviewed.   ED Course  Procedures (including critical care time) Labs Review Labs Reviewed  CBC - Abnormal; Notable for the following:    WBC 3.7 (*)    All other components within normal limits  COMPREHENSIVE METABOLIC PANEL - Abnormal; Notable for the following:    Potassium 3.4 (*)    CO2 21 (*)    Glucose, Bld 169 (*)    BUN 21 (*)    Creatinine, Ser 1.05 (*)    GFR calc non Af Amer 48 (*)    GFR calc Af Amer 56 (*)    All other components within normal limits  HEMOGLOBIN A1C - Abnormal; Notable for the following:    Hgb A1c MFr Bld 6.3 (*)    All other components within normal limits  LIPID PANEL - Abnormal; Notable for the following:    LDL Cholesterol 100 (*)    All other components within normal limits  CBC - Abnormal; Notable for the following:    WBC 3.7 (*)    All other components within normal limits  GLUCOSE, CAPILLARY - Abnormal; Notable for the following:    Glucose-Capillary 115 (*)    All other components within normal limits  BASIC METABOLIC PANEL - Abnormal; Notable for the following:    Potassium 3.4 (*)    Glucose, Bld 130 (*)    BUN 21 (*)    GFR calc non Af Amer 56 (*)    All other components within normal limits  GLUCOSE, CAPILLARY - Abnormal; Notable for the following:    Glucose-Capillary 140 (*)    All other components within normal limits  GLUCOSE, CAPILLARY - Abnormal; Notable for the following:    Glucose-Capillary 138 (*)    All other components within normal limits  BASIC METABOLIC PANEL - Abnormal; Notable for the following:    Potassium 3.2 (*)     Creatinine, Ser 1.01 (*)    Calcium 8.8 (*)    GFR calc non Af Amer 51 (*)    GFR calc Af Amer 59 (*)    All other components within normal limits  GLUCOSE, CAPILLARY - Abnormal; Notable for the following:    Glucose-Capillary 142 (*)    All other components within normal limits  GLUCOSE, CAPILLARY - Abnormal; Notable for the following:    Glucose-Capillary 109 (*)    All other components within normal limits  GLUCOSE, CAPILLARY - Abnormal; Notable for the following:    Glucose-Capillary 150 (*)  All other components within normal limits  BASIC METABOLIC PANEL - Abnormal; Notable for the following:    CO2 19 (*)    Glucose, Bld 117 (*)    BUN 27 (*)    Creatinine, Ser 1.01 (*)    GFR calc non Af Amer 50 (*)    GFR calc Af Amer 58 (*)    All other components within normal limits  GLUCOSE, CAPILLARY - Abnormal; Notable for the following:    Glucose-Capillary 119 (*)    All other components within normal limits  GLUCOSE, CAPILLARY - Abnormal; Notable for the following:    Glucose-Capillary 138 (*)    All other components within normal limits  GLUCOSE, CAPILLARY - Abnormal; Notable for the following:    Glucose-Capillary 112 (*)    All other components within normal limits  GLUCOSE, CAPILLARY - Abnormal; Notable for the following:    Glucose-Capillary 159 (*)    All other components within normal limits  BASIC METABOLIC PANEL - Abnormal; Notable for the following:    Glucose, Bld 131 (*)    BUN 26 (*)    Creatinine, Ser 1.04 (*)    Calcium 8.6 (*)    GFR calc non Af Amer 49 (*)    GFR calc Af Amer 56 (*)    All other components within normal limits  URINALYSIS, ROUTINE W REFLEX MICROSCOPIC (NOT AT [facility]) - Abnormal; Notable for the following:    APPearance CLOUDY (*)    All other components within normal limits  GLUCOSE, CAPILLARY - Abnormal; Notable for the following:    Glucose-Capillary 145 (*)    All other components within normal limits  GLUCOSE, CAPILLARY -  Abnormal; Notable for the following:    Glucose-Capillary 143 (*)    All other components within normal limits  GLUCOSE, CAPILLARY - Abnormal; Notable for the following:    Glucose-Capillary 114 (*)    All other components within normal limits  GLUCOSE, CAPILLARY - Abnormal; Notable for the following:    Glucose-Capillary 159 (*)    All other components within normal limits  GLUCOSE, CAPILLARY - Abnormal; Notable for the following:    Glucose-Capillary 109 (*)    All other components within normal limits  GLUCOSE, CAPILLARY - Abnormal; Notable for the following:    Glucose-Capillary 137 (*)    All other components within normal limits  I-STAT CHEM 8, ED - Abnormal; Notable for the following:    Potassium 3.4 (*)    BUN 24 (*)    Glucose, Bld 172 (*)    Calcium, Ion 1.09 (*)    Hemoglobin 15.6 (*)    All other components within normal limits  MRSA PCR SCREENING  ETHANOL  PROTIME-INR  APTT  DIFFERENTIAL  URINE RAPID DRUG SCREEN, HOSP PERFORMED  GLUCOSE, CAPILLARY  CBC  GLUCOSE, CAPILLARY  CBC  GLUCOSE, CAPILLARY  GLUCOSE, CAPILLARY  I-STAT TROPOININ, ED    Imaging Review No results found. I, Avrey Hyser, personally reviewed and evaluated these images and lab results as part of my medical decision-making.   EKG Interpretation   Date/Time:  Thursday January 08 2015 21:53:45 EDT Ventricular Rate:  85 PR Interval:    QRS Duration: 167 QT Interval:  451 QTC Calculation: 536 R Axis:   -83 Text Interpretation:  Atrial fibrillation Ventricular tachycardia,  unsustained IVCD, consider atypical RBBB LVH with IVCD, LAD and secondary  repol abnrm ED PHYSICIAN INTERPRETATION AVAILABLE IN [facility]  Confirmed by TEST, Record ([id number]) on 01/09/2015 7:32:58 AM  MDM   Final diagnoses:  Cerebral infarction due to unspecified mechanism    81 year old female with left-sided weakness. Evaluated by neurology. Decision to give TPA. Admission to the neurology  service.    Raeford Razor, MD 01/18/15 1110

## 2015-01-27 DIAGNOSIS — L851 Acquired keratosis [keratoderma] palmaris et plantaris: Secondary | ICD-10-CM | POA: Diagnosis not present

## 2015-01-27 DIAGNOSIS — B351 Tinea unguium: Secondary | ICD-10-CM | POA: Diagnosis not present

## 2015-01-27 DIAGNOSIS — E119 Type 2 diabetes mellitus without complications: Secondary | ICD-10-CM | POA: Diagnosis not present

## 2015-01-28 DIAGNOSIS — E119 Type 2 diabetes mellitus without complications: Secondary | ICD-10-CM | POA: Diagnosis not present

## 2015-01-28 DIAGNOSIS — L97221 Non-pressure chronic ulcer of left calf limited to breakdown of skin: Secondary | ICD-10-CM | POA: Diagnosis not present

## 2015-01-28 DIAGNOSIS — I69354 Hemiplegia and hemiparesis following cerebral infarction affecting left non-dominant side: Secondary | ICD-10-CM | POA: Diagnosis not present

## 2015-01-28 DIAGNOSIS — I4891 Unspecified atrial fibrillation: Secondary | ICD-10-CM | POA: Diagnosis not present

## 2015-01-28 DIAGNOSIS — S80821D Blister (nonthermal), right lower leg, subsequent encounter: Secondary | ICD-10-CM | POA: Diagnosis not present

## 2015-01-28 DIAGNOSIS — I10 Essential (primary) hypertension: Secondary | ICD-10-CM | POA: Diagnosis not present

## 2015-01-30 DIAGNOSIS — E119 Type 2 diabetes mellitus without complications: Secondary | ICD-10-CM | POA: Diagnosis not present

## 2015-01-30 DIAGNOSIS — S80821D Blister (nonthermal), right lower leg, subsequent encounter: Secondary | ICD-10-CM | POA: Diagnosis not present

## 2015-01-30 DIAGNOSIS — I69354 Hemiplegia and hemiparesis following cerebral infarction affecting left non-dominant side: Secondary | ICD-10-CM | POA: Diagnosis not present

## 2015-01-30 DIAGNOSIS — N390 Urinary tract infection, site not specified: Secondary | ICD-10-CM | POA: Diagnosis not present

## 2015-01-30 DIAGNOSIS — I10 Essential (primary) hypertension: Secondary | ICD-10-CM | POA: Diagnosis not present

## 2015-01-30 DIAGNOSIS — L97221 Non-pressure chronic ulcer of left calf limited to breakdown of skin: Secondary | ICD-10-CM | POA: Diagnosis not present

## 2015-01-30 DIAGNOSIS — I4891 Unspecified atrial fibrillation: Secondary | ICD-10-CM | POA: Diagnosis not present

## 2015-01-31 DIAGNOSIS — L97221 Non-pressure chronic ulcer of left calf limited to breakdown of skin: Secondary | ICD-10-CM | POA: Diagnosis not present

## 2015-01-31 DIAGNOSIS — I10 Essential (primary) hypertension: Secondary | ICD-10-CM | POA: Diagnosis not present

## 2015-01-31 DIAGNOSIS — I4891 Unspecified atrial fibrillation: Secondary | ICD-10-CM | POA: Diagnosis not present

## 2015-01-31 DIAGNOSIS — E119 Type 2 diabetes mellitus without complications: Secondary | ICD-10-CM | POA: Diagnosis not present

## 2015-01-31 DIAGNOSIS — I69354 Hemiplegia and hemiparesis following cerebral infarction affecting left non-dominant side: Secondary | ICD-10-CM | POA: Diagnosis not present

## 2015-01-31 DIAGNOSIS — S80821D Blister (nonthermal), right lower leg, subsequent encounter: Secondary | ICD-10-CM | POA: Diagnosis not present

## 2015-02-02 DIAGNOSIS — I6350 Cerebral infarction due to unspecified occlusion or stenosis of unspecified cerebral artery: Secondary | ICD-10-CM | POA: Diagnosis not present

## 2015-02-02 DIAGNOSIS — H4011X3 Primary open-angle glaucoma, severe stage: Secondary | ICD-10-CM | POA: Diagnosis not present

## 2015-02-02 DIAGNOSIS — E119 Type 2 diabetes mellitus without complications: Secondary | ICD-10-CM | POA: Diagnosis not present

## 2015-02-02 DIAGNOSIS — H524 Presbyopia: Secondary | ICD-10-CM | POA: Diagnosis not present

## 2015-02-02 DIAGNOSIS — H5210 Myopia, unspecified eye: Secondary | ICD-10-CM | POA: Diagnosis not present

## 2015-02-02 DIAGNOSIS — I10 Essential (primary) hypertension: Secondary | ICD-10-CM | POA: Diagnosis not present

## 2015-02-03 DIAGNOSIS — I69354 Hemiplegia and hemiparesis following cerebral infarction affecting left non-dominant side: Secondary | ICD-10-CM | POA: Diagnosis not present

## 2015-02-03 DIAGNOSIS — S80821D Blister (nonthermal), right lower leg, subsequent encounter: Secondary | ICD-10-CM | POA: Diagnosis not present

## 2015-02-03 DIAGNOSIS — I4891 Unspecified atrial fibrillation: Secondary | ICD-10-CM | POA: Diagnosis not present

## 2015-02-03 DIAGNOSIS — I10 Essential (primary) hypertension: Secondary | ICD-10-CM | POA: Diagnosis not present

## 2015-02-03 DIAGNOSIS — E119 Type 2 diabetes mellitus without complications: Secondary | ICD-10-CM | POA: Diagnosis not present

## 2015-02-03 DIAGNOSIS — L97221 Non-pressure chronic ulcer of left calf limited to breakdown of skin: Secondary | ICD-10-CM | POA: Diagnosis not present

## 2015-02-05 DIAGNOSIS — I4891 Unspecified atrial fibrillation: Secondary | ICD-10-CM | POA: Diagnosis not present

## 2015-02-05 DIAGNOSIS — S80821D Blister (nonthermal), right lower leg, subsequent encounter: Secondary | ICD-10-CM | POA: Diagnosis not present

## 2015-02-05 DIAGNOSIS — I10 Essential (primary) hypertension: Secondary | ICD-10-CM | POA: Diagnosis not present

## 2015-02-05 DIAGNOSIS — I69354 Hemiplegia and hemiparesis following cerebral infarction affecting left non-dominant side: Secondary | ICD-10-CM | POA: Diagnosis not present

## 2015-02-05 DIAGNOSIS — L97221 Non-pressure chronic ulcer of left calf limited to breakdown of skin: Secondary | ICD-10-CM | POA: Diagnosis not present

## 2015-02-05 DIAGNOSIS — E119 Type 2 diabetes mellitus without complications: Secondary | ICD-10-CM | POA: Diagnosis not present

## 2015-02-06 DIAGNOSIS — L97221 Non-pressure chronic ulcer of left calf limited to breakdown of skin: Secondary | ICD-10-CM | POA: Diagnosis not present

## 2015-02-06 DIAGNOSIS — S80821D Blister (nonthermal), right lower leg, subsequent encounter: Secondary | ICD-10-CM | POA: Diagnosis not present

## 2015-02-06 DIAGNOSIS — E119 Type 2 diabetes mellitus without complications: Secondary | ICD-10-CM | POA: Diagnosis not present

## 2015-02-06 DIAGNOSIS — I69354 Hemiplegia and hemiparesis following cerebral infarction affecting left non-dominant side: Secondary | ICD-10-CM | POA: Diagnosis not present

## 2015-02-06 DIAGNOSIS — I4891 Unspecified atrial fibrillation: Secondary | ICD-10-CM | POA: Diagnosis not present

## 2015-02-06 DIAGNOSIS — I10 Essential (primary) hypertension: Secondary | ICD-10-CM | POA: Diagnosis not present

## 2015-02-10 DIAGNOSIS — S80821D Blister (nonthermal), right lower leg, subsequent encounter: Secondary | ICD-10-CM | POA: Diagnosis not present

## 2015-02-10 DIAGNOSIS — I69354 Hemiplegia and hemiparesis following cerebral infarction affecting left non-dominant side: Secondary | ICD-10-CM | POA: Diagnosis not present

## 2015-02-10 DIAGNOSIS — E119 Type 2 diabetes mellitus without complications: Secondary | ICD-10-CM | POA: Diagnosis not present

## 2015-02-10 DIAGNOSIS — I4891 Unspecified atrial fibrillation: Secondary | ICD-10-CM | POA: Diagnosis not present

## 2015-02-10 DIAGNOSIS — L97221 Non-pressure chronic ulcer of left calf limited to breakdown of skin: Secondary | ICD-10-CM | POA: Diagnosis not present

## 2015-02-10 DIAGNOSIS — I10 Essential (primary) hypertension: Secondary | ICD-10-CM | POA: Diagnosis not present

## 2015-02-11 DIAGNOSIS — I69354 Hemiplegia and hemiparesis following cerebral infarction affecting left non-dominant side: Secondary | ICD-10-CM | POA: Diagnosis not present

## 2015-02-11 DIAGNOSIS — E119 Type 2 diabetes mellitus without complications: Secondary | ICD-10-CM | POA: Diagnosis not present

## 2015-02-11 DIAGNOSIS — I4891 Unspecified atrial fibrillation: Secondary | ICD-10-CM | POA: Diagnosis not present

## 2015-02-11 DIAGNOSIS — S80821D Blister (nonthermal), right lower leg, subsequent encounter: Secondary | ICD-10-CM | POA: Diagnosis not present

## 2015-02-11 DIAGNOSIS — L97221 Non-pressure chronic ulcer of left calf limited to breakdown of skin: Secondary | ICD-10-CM | POA: Diagnosis not present

## 2015-02-11 DIAGNOSIS — I10 Essential (primary) hypertension: Secondary | ICD-10-CM | POA: Diagnosis not present

## 2015-02-12 DIAGNOSIS — I10 Essential (primary) hypertension: Secondary | ICD-10-CM | POA: Diagnosis not present

## 2015-02-12 DIAGNOSIS — E119 Type 2 diabetes mellitus without complications: Secondary | ICD-10-CM | POA: Diagnosis not present

## 2015-02-12 DIAGNOSIS — I4891 Unspecified atrial fibrillation: Secondary | ICD-10-CM | POA: Diagnosis not present

## 2015-02-12 DIAGNOSIS — S80821D Blister (nonthermal), right lower leg, subsequent encounter: Secondary | ICD-10-CM | POA: Diagnosis not present

## 2015-02-12 DIAGNOSIS — L97221 Non-pressure chronic ulcer of left calf limited to breakdown of skin: Secondary | ICD-10-CM | POA: Diagnosis not present

## 2015-02-12 DIAGNOSIS — I69354 Hemiplegia and hemiparesis following cerebral infarction affecting left non-dominant side: Secondary | ICD-10-CM | POA: Diagnosis not present

## 2015-02-16 DIAGNOSIS — S80821D Blister (nonthermal), right lower leg, subsequent encounter: Secondary | ICD-10-CM | POA: Diagnosis not present

## 2015-02-16 DIAGNOSIS — I10 Essential (primary) hypertension: Secondary | ICD-10-CM | POA: Diagnosis not present

## 2015-02-16 DIAGNOSIS — L97221 Non-pressure chronic ulcer of left calf limited to breakdown of skin: Secondary | ICD-10-CM | POA: Diagnosis not present

## 2015-02-16 DIAGNOSIS — E119 Type 2 diabetes mellitus without complications: Secondary | ICD-10-CM | POA: Diagnosis not present

## 2015-02-16 DIAGNOSIS — I69354 Hemiplegia and hemiparesis following cerebral infarction affecting left non-dominant side: Secondary | ICD-10-CM | POA: Diagnosis not present

## 2015-02-16 DIAGNOSIS — I4891 Unspecified atrial fibrillation: Secondary | ICD-10-CM | POA: Diagnosis not present

## 2015-02-18 DIAGNOSIS — I10 Essential (primary) hypertension: Secondary | ICD-10-CM | POA: Diagnosis not present

## 2015-02-18 DIAGNOSIS — S80821D Blister (nonthermal), right lower leg, subsequent encounter: Secondary | ICD-10-CM | POA: Diagnosis not present

## 2015-02-18 DIAGNOSIS — E119 Type 2 diabetes mellitus without complications: Secondary | ICD-10-CM | POA: Diagnosis not present

## 2015-02-18 DIAGNOSIS — I4891 Unspecified atrial fibrillation: Secondary | ICD-10-CM | POA: Diagnosis not present

## 2015-02-18 DIAGNOSIS — L97221 Non-pressure chronic ulcer of left calf limited to breakdown of skin: Secondary | ICD-10-CM | POA: Diagnosis not present

## 2015-02-18 DIAGNOSIS — I69354 Hemiplegia and hemiparesis following cerebral infarction affecting left non-dominant side: Secondary | ICD-10-CM | POA: Diagnosis not present

## 2015-02-23 DIAGNOSIS — I10 Essential (primary) hypertension: Secondary | ICD-10-CM | POA: Diagnosis not present

## 2015-02-23 DIAGNOSIS — I5022 Chronic systolic (congestive) heart failure: Secondary | ICD-10-CM | POA: Diagnosis not present

## 2015-02-23 DIAGNOSIS — I480 Paroxysmal atrial fibrillation: Secondary | ICD-10-CM | POA: Diagnosis not present

## 2015-02-23 DIAGNOSIS — Z950 Presence of cardiac pacemaker: Secondary | ICD-10-CM | POA: Diagnosis not present

## 2015-02-23 DIAGNOSIS — I459 Conduction disorder, unspecified: Secondary | ICD-10-CM | POA: Diagnosis not present

## 2015-02-24 DIAGNOSIS — I69354 Hemiplegia and hemiparesis following cerebral infarction affecting left non-dominant side: Secondary | ICD-10-CM | POA: Diagnosis not present

## 2015-02-24 DIAGNOSIS — I10 Essential (primary) hypertension: Secondary | ICD-10-CM | POA: Diagnosis not present

## 2015-02-24 DIAGNOSIS — I4891 Unspecified atrial fibrillation: Secondary | ICD-10-CM | POA: Diagnosis not present

## 2015-02-24 DIAGNOSIS — S80821D Blister (nonthermal), right lower leg, subsequent encounter: Secondary | ICD-10-CM | POA: Diagnosis not present

## 2015-02-24 DIAGNOSIS — L97221 Non-pressure chronic ulcer of left calf limited to breakdown of skin: Secondary | ICD-10-CM | POA: Diagnosis not present

## 2015-02-24 DIAGNOSIS — E119 Type 2 diabetes mellitus without complications: Secondary | ICD-10-CM | POA: Diagnosis not present

## 2015-02-25 DIAGNOSIS — S80821D Blister (nonthermal), right lower leg, subsequent encounter: Secondary | ICD-10-CM | POA: Diagnosis not present

## 2015-02-25 DIAGNOSIS — L97221 Non-pressure chronic ulcer of left calf limited to breakdown of skin: Secondary | ICD-10-CM | POA: Diagnosis not present

## 2015-02-25 DIAGNOSIS — I4891 Unspecified atrial fibrillation: Secondary | ICD-10-CM | POA: Diagnosis not present

## 2015-02-25 DIAGNOSIS — I69354 Hemiplegia and hemiparesis following cerebral infarction affecting left non-dominant side: Secondary | ICD-10-CM | POA: Diagnosis not present

## 2015-02-25 DIAGNOSIS — I10 Essential (primary) hypertension: Secondary | ICD-10-CM | POA: Diagnosis not present

## 2015-02-25 DIAGNOSIS — E119 Type 2 diabetes mellitus without complications: Secondary | ICD-10-CM | POA: Diagnosis not present

## 2015-02-26 DIAGNOSIS — I4891 Unspecified atrial fibrillation: Secondary | ICD-10-CM | POA: Diagnosis not present

## 2015-02-26 DIAGNOSIS — L97221 Non-pressure chronic ulcer of left calf limited to breakdown of skin: Secondary | ICD-10-CM | POA: Diagnosis not present

## 2015-02-26 DIAGNOSIS — I10 Essential (primary) hypertension: Secondary | ICD-10-CM | POA: Diagnosis not present

## 2015-02-26 DIAGNOSIS — E119 Type 2 diabetes mellitus without complications: Secondary | ICD-10-CM | POA: Diagnosis not present

## 2015-02-26 DIAGNOSIS — S80821D Blister (nonthermal), right lower leg, subsequent encounter: Secondary | ICD-10-CM | POA: Diagnosis not present

## 2015-02-26 DIAGNOSIS — I69354 Hemiplegia and hemiparesis following cerebral infarction affecting left non-dominant side: Secondary | ICD-10-CM | POA: Diagnosis not present

## 2015-03-02 DIAGNOSIS — L97221 Non-pressure chronic ulcer of left calf limited to breakdown of skin: Secondary | ICD-10-CM | POA: Diagnosis not present

## 2015-03-02 DIAGNOSIS — I10 Essential (primary) hypertension: Secondary | ICD-10-CM | POA: Diagnosis not present

## 2015-03-02 DIAGNOSIS — I4891 Unspecified atrial fibrillation: Secondary | ICD-10-CM | POA: Diagnosis not present

## 2015-03-02 DIAGNOSIS — I69354 Hemiplegia and hemiparesis following cerebral infarction affecting left non-dominant side: Secondary | ICD-10-CM | POA: Diagnosis not present

## 2015-03-02 DIAGNOSIS — S80821D Blister (nonthermal), right lower leg, subsequent encounter: Secondary | ICD-10-CM | POA: Diagnosis not present

## 2015-03-02 DIAGNOSIS — E119 Type 2 diabetes mellitus without complications: Secondary | ICD-10-CM | POA: Diagnosis not present

## 2015-03-04 DIAGNOSIS — E119 Type 2 diabetes mellitus without complications: Secondary | ICD-10-CM | POA: Diagnosis not present

## 2015-03-04 DIAGNOSIS — I4891 Unspecified atrial fibrillation: Secondary | ICD-10-CM | POA: Diagnosis not present

## 2015-03-04 DIAGNOSIS — I69354 Hemiplegia and hemiparesis following cerebral infarction affecting left non-dominant side: Secondary | ICD-10-CM | POA: Diagnosis not present

## 2015-03-04 DIAGNOSIS — I10 Essential (primary) hypertension: Secondary | ICD-10-CM | POA: Diagnosis not present

## 2015-03-04 DIAGNOSIS — S80821D Blister (nonthermal), right lower leg, subsequent encounter: Secondary | ICD-10-CM | POA: Diagnosis not present

## 2015-03-04 DIAGNOSIS — L97221 Non-pressure chronic ulcer of left calf limited to breakdown of skin: Secondary | ICD-10-CM | POA: Diagnosis not present

## 2015-03-09 DIAGNOSIS — S80821D Blister (nonthermal), right lower leg, subsequent encounter: Secondary | ICD-10-CM | POA: Diagnosis not present

## 2015-03-09 DIAGNOSIS — L97221 Non-pressure chronic ulcer of left calf limited to breakdown of skin: Secondary | ICD-10-CM | POA: Diagnosis not present

## 2015-03-09 DIAGNOSIS — E119 Type 2 diabetes mellitus without complications: Secondary | ICD-10-CM | POA: Diagnosis not present

## 2015-03-09 DIAGNOSIS — I10 Essential (primary) hypertension: Secondary | ICD-10-CM | POA: Diagnosis not present

## 2015-03-09 DIAGNOSIS — I4891 Unspecified atrial fibrillation: Secondary | ICD-10-CM | POA: Diagnosis not present

## 2015-03-09 DIAGNOSIS — I69354 Hemiplegia and hemiparesis following cerebral infarction affecting left non-dominant side: Secondary | ICD-10-CM | POA: Diagnosis not present

## 2015-03-10 DIAGNOSIS — E119 Type 2 diabetes mellitus without complications: Secondary | ICD-10-CM | POA: Diagnosis not present

## 2015-03-10 DIAGNOSIS — S80821D Blister (nonthermal), right lower leg, subsequent encounter: Secondary | ICD-10-CM | POA: Diagnosis not present

## 2015-03-10 DIAGNOSIS — I4891 Unspecified atrial fibrillation: Secondary | ICD-10-CM | POA: Diagnosis not present

## 2015-03-10 DIAGNOSIS — I69354 Hemiplegia and hemiparesis following cerebral infarction affecting left non-dominant side: Secondary | ICD-10-CM | POA: Diagnosis not present

## 2015-03-10 DIAGNOSIS — L97221 Non-pressure chronic ulcer of left calf limited to breakdown of skin: Secondary | ICD-10-CM | POA: Diagnosis not present

## 2015-03-10 DIAGNOSIS — I10 Essential (primary) hypertension: Secondary | ICD-10-CM | POA: Diagnosis not present

## 2015-03-13 DIAGNOSIS — I10 Essential (primary) hypertension: Secondary | ICD-10-CM | POA: Diagnosis not present

## 2015-03-13 DIAGNOSIS — E119 Type 2 diabetes mellitus without complications: Secondary | ICD-10-CM | POA: Diagnosis not present

## 2015-03-13 DIAGNOSIS — I4891 Unspecified atrial fibrillation: Secondary | ICD-10-CM | POA: Diagnosis not present

## 2015-03-13 DIAGNOSIS — I69354 Hemiplegia and hemiparesis following cerebral infarction affecting left non-dominant side: Secondary | ICD-10-CM | POA: Diagnosis not present

## 2015-03-13 DIAGNOSIS — S80821D Blister (nonthermal), right lower leg, subsequent encounter: Secondary | ICD-10-CM | POA: Diagnosis not present

## 2015-03-13 DIAGNOSIS — L97221 Non-pressure chronic ulcer of left calf limited to breakdown of skin: Secondary | ICD-10-CM | POA: Diagnosis not present

## 2015-03-16 ENCOUNTER — Ambulatory Visit (INDEPENDENT_AMBULATORY_CARE_PROVIDER_SITE_OTHER): Payer: Commercial Managed Care - HMO | Admitting: Neurology

## 2015-03-16 ENCOUNTER — Encounter: Payer: Self-pay | Admitting: Neurology

## 2015-03-16 VITALS — BP 136/96 | HR 69 | Ht 65.0 in | Wt 128.6 lb

## 2015-03-16 DIAGNOSIS — I639 Cerebral infarction, unspecified: Secondary | ICD-10-CM | POA: Insufficient documentation

## 2015-03-16 DIAGNOSIS — E119 Type 2 diabetes mellitus without complications: Secondary | ICD-10-CM | POA: Diagnosis not present

## 2015-03-16 DIAGNOSIS — I63131 Cerebral infarction due to embolism of right carotid artery: Secondary | ICD-10-CM

## 2015-03-16 DIAGNOSIS — Z9181 History of falling: Secondary | ICD-10-CM | POA: Diagnosis not present

## 2015-03-16 DIAGNOSIS — I69354 Hemiplegia and hemiparesis following cerebral infarction affecting left non-dominant side: Secondary | ICD-10-CM | POA: Diagnosis not present

## 2015-03-16 DIAGNOSIS — I10 Essential (primary) hypertension: Secondary | ICD-10-CM | POA: Diagnosis not present

## 2015-03-16 DIAGNOSIS — I4891 Unspecified atrial fibrillation: Secondary | ICD-10-CM | POA: Diagnosis not present

## 2015-03-16 DIAGNOSIS — S80821D Blister (nonthermal), right lower leg, subsequent encounter: Secondary | ICD-10-CM | POA: Diagnosis not present

## 2015-03-16 DIAGNOSIS — L97221 Non-pressure chronic ulcer of left calf limited to breakdown of skin: Secondary | ICD-10-CM | POA: Diagnosis not present

## 2015-03-16 NOTE — Patient Instructions (Signed)
I had a long d/w patient and daughter about her recent stroke, risk for recurrent stroke/TIAs, personally independently reviewed imaging studies and stroke evaluation results and answered questions.Continue Apixaban  for secondary stroke prevention  For atrial fibrillation and maintain strict control of hypertension with blood pressure goal below 130/90, diabetes with hemoglobin A1c goal below 6.5% and lipids with LDL cholesterol goal below 100 mg/dL. I also advised the patient to eat a healthy diet with plenty of whole grains, cereals, fruits and vegetables, exercise regularly and maintain ideal body weight .I have also advised the patient to use a Benedicto at all times to avoid falls and discussed fall risk prevention and home safety precautions. Followup in the future with me in 6 months or call earlier if necessary. Stroke Prevention Some medical conditions and behaviors are associated with an increased chance of having a stroke. You may prevent a stroke by making healthy choices and managing medical conditions. HOW CAN I REDUCE MY RISK OF HAVING A STROKE?   Stay physically active. Get at least 30 minutes of activity on most or all days.  Do not smoke. It may also be helpful to avoid exposure to secondhand smoke.  Limit alcohol use. Moderate alcohol use is considered to be:  No more than 2 drinks per day for men.  No more than 1 drink per day for nonpregnant women.  Eat healthy foods. This involves:  Eating 5 or more servings of fruits and vegetables a day.  Making dietary changes that address high blood pressure (hypertension), high cholesterol, diabetes, or obesity.  Manage your cholesterol levels.  Making food choices that are high in fiber and low in saturated fat, trans fat, and cholesterol may control cholesterol levels.  Take any prescribed medicines to control cholesterol as directed by your health care provider.  Manage your diabetes.  Controlling your carbohydrate and sugar  intake is recommended to manage diabetes.  Take any prescribed medicines to control diabetes as directed by your health care provider.  Control your hypertension.  Making food choices that are low in salt (sodium), saturated fat, trans fat, and cholesterol is recommended to manage hypertension.  Ask your health care provider if you need treatment to lower your blood pressure. Take any prescribed medicines to control hypertension as directed by your health care provider.  If you are 93-81 years of age, have your blood pressure checked every 3-5 years. If you are 58 years of age or older, have your blood pressure checked every year.  Maintain a healthy weight.  Reducing calorie intake and making food choices that are low in sodium, saturated fat, trans fat, and cholesterol are recommended to manage weight.  Stop drug abuse.  Avoid taking birth control pills.  Talk to your health care provider about the risks of taking birth control pills if you are over 18 years old, smoke, get migraines, or have ever had a blood clot.  Get evaluated for sleep disorders (sleep apnea).  Talk to your health care provider about getting a sleep evaluation if you snore a lot or have excessive sleepiness.  Take medicines only as directed by your health care provider.  For some people, aspirin or blood thinners (anticoagulants) are helpful in reducing the risk of forming abnormal blood clots that can lead to stroke. If you have the irregular heart rhythm of atrial fibrillation, you should be on a blood thinner unless there is a good reason you cannot take them.  Understand all your medicine instructions.  Make sure  that other conditions (such as anemia or atherosclerosis) are addressed. SEEK IMMEDIATE MEDICAL CARE IF:   You have sudden weakness or numbness of the face, arm, or leg, especially on one side of the body.  Your face or eyelid droops to one side.  You have sudden confusion.  You have  trouble speaking (aphasia) or understanding.  You have sudden trouble seeing in one or both eyes.  You have sudden trouble walking.  You have dizziness.  You have a loss of balance or coordination.  You have a sudden, severe headache with no known cause.  You have new chest pain or an irregular heartbeat. Any of these symptoms may represent a serious problem that is an emergency. Do not wait to see if the symptoms will go away. Get medical help at once. Call your local emergency services (911 in [place] Do not drive yourself to the hospital.   This information is not intended to replace advice given to you by your health care provider. Make sure you discuss any questions you have with your health care provider.   Document Released: 06/30/2004 Document Revised: 06/13/2014 Document Reviewed: 11/23/2012 Elsevier Interactive Patient Education 2016 [employer]. Fall Prevention in the Home  Falls can cause injuries. They can happen to people of all ages. There are many things you can do to make your home safe and to help prevent falls.  WHAT CAN I DO ON THE OUTSIDE OF MY HOME?  Regularly fix the edges of walkways and driveways and fix any cracks.  Remove anything that might make you trip as you walk through a door, such as a raised step or threshold.  Trim any bushes or trees on the path to your home.  Use bright outdoor lighting.  Clear any walking paths of anything that might make someone trip, such as rocks or tools.  Regularly check to see if handrails are loose or broken. Make sure that both sides of any steps have handrails.  Any raised decks and porches should have guardrails on the edges.  Have any leaves, snow, or ice cleared regularly.  Use sand or salt on walking paths during winter.  Clean up any spills in your garage right away. This includes oil or grease spills. WHAT CAN I DO IN THE BATHROOM?   Use night lights.  Install grab bars by the toilet and in the tub  and shower. Do not use towel bars as grab bars.  Use non-skid mats or decals in the tub or shower.  If you need to sit down in the shower, use a plastic, non-slip stool.  Keep the floor dry. Clean up any water that spills on the floor as soon as it happens.  Remove soap buildup in the tub or shower regularly.  Attach bath mats securely with double-sided non-slip rug tape.  Do not have throw rugs and other things on the floor that can make you trip. WHAT CAN I DO IN THE BEDROOM?  Use night lights.  Make sure that you have a light by your bed that is easy to reach.  Do not use any sheets or blankets that are too big for your bed. They should not hang down onto the floor.  Have a firm chair that has side arms. You can use this for support while you get dressed.  Do not have throw rugs and other things on the floor that can make you trip. WHAT CAN I DO IN THE KITCHEN?  Clean up any spills right  away.  Avoid walking on wet floors.  Keep items that you use a lot in easy-to-reach places.  If you need to reach something above you, use a strong step stool that has a grab bar.  Keep electrical cords out of the way.  Do not use floor polish or wax that makes floors slippery. If you must use wax, use non-skid floor wax.  Do not have throw rugs and other things on the floor that can make you trip. WHAT CAN I DO WITH MY STAIRS?  Do not leave any items on the stairs.  Make sure that there are handrails on both sides of the stairs and use them. Fix handrails that are broken or loose. Make sure that handrails are as long as the stairways.  Check any carpeting to make sure that it is firmly attached to the stairs. Fix any carpet that is loose or worn.  Avoid having throw rugs at the top or bottom of the stairs. If you do have throw rugs, attach them to the floor with carpet tape.  Make sure that you have a light switch at the top of the stairs and the bottom of the stairs. If you do  not have them, ask someone to add them for you. WHAT ELSE CAN I DO TO HELP PREVENT FALLS?  Wear shoes that:  Do not have high heels.  Have rubber bottoms.  Are comfortable and fit you well.  Are closed at the toe. Do not wear sandals.  If you use a stepladder:  Make sure that it is fully opened. Do not climb a closed stepladder.  Make sure that both sides of the stepladder are locked into place.  Ask someone to hold it for you, if possible.  Clearly mark and make sure that you can see:  Any grab bars or handrails.  First and last steps.  Where the edge of each step is.  Use tools that help you move around (mobility aids) if they are needed. These include:  Canes.  Walkers.  Scooters.  Crutches.  Turn on the lights when you go into a dark area. Replace any light bulbs as soon as they burn out.  Set up your furniture so you have a clear path. Avoid moving your furniture around.  If any of your floors are uneven, fix them.  If there are any pets around you, be aware of where they are.  Review your medicines with your doctor. Some medicines can make you feel dizzy. This can increase your chance of falling. Ask your doctor what other things that you can do to help prevent falls.   This information is not intended to replace advice given to you by your health care provider. Make sure you discuss any questions you have with your health care provider.   Document Released: 03/19/2009 Document Revised: 10/07/2014 Document Reviewed: 06/27/2014 Elsevier Interactive Patient Education [employer]

## 2015-03-16 NOTE — Progress Notes (Signed)
[facility] [address] [place]. [place] [address] [phone]       OFFICE FOLLOW-UP NOTE  [name] Date of Birth:  [birth year] Medical Record Number:  [id number]   HPI: 53 year pleasant Caucasian lady seen today for first office follow-up visit following hospital admission for stroke in August 2016.Tamara Campbell is an 81 y.o. female with a history of HTN and diabetes who reported noncompliance with her antihypertensives who was at her baseline on 01/08/2015. That evening she sat down to eat supper and noted no problems. When she attempted to get up to leave the table she was unable to support herself and fell. She noted left sided weakness. (LKW 01/08/2015 at 1800). EMS was called at that time. Patient was hypertensive. En route was noted to have a starring spell when she gazed to the right and did not respond to questioning. Patient has had a stroke in the past in 2005. Patient had some mild right sided weakness as residual from this per daughter. At baseline patient lives with son. She ambulates with a Lundin. She dresses herself and goes to the bathroom independently. She does not report incontinence but does wear Depends because she reports that she does not always make it to the bathroom. Her son does the cooking and cleaning.IV tPA was administered. Patient was admitted for further stroke evaluation. Patient was treated with IV TPA and did well. She was monitored in intensive care unit and blood pressure was tightly controlled. She showed improvement in her weakness. CT scan of the head showed right frontal infarct with mild cytotoxic edema but no hemorrhage. Transthoracic echo showed ejection fraction of 25% but no definite thrombus was noted. LDL cholesterol was slightly elevated at 100 and hemoglobin A1c was borderline at 6.3. Patient was started on aspirin 325 mg daily initially and then changed to eliquis for secondary stroke prevention. She will  discharge to skilled nursing facility for ongoing physical occupational and speech therapy needs. Patient had an pacemaker and hence MRI was not obtained. Patient was diagnosed with new-onset atrial fibrillation and cardiology was consulted and helped with management. She was started on Lipitor 20 mg daily for her elevated lipids. Patient states she's done well since discharge. She is currently living at home and had one fall. She has finished outpatient home therapy and is now living at home with her son. She is mostly independent. She has mild pre-existing memory difficulties which as per the patient and unchanged per the daughter states that this may be slightly worse. Patient states her blood pressure is well controlled today it is 136/96. Sugars are well controlled and fasting sugar today was 104. She is tolerating eliquis well without bleeding or bruising problems. She states her left hand strength has improved mostly though fine motor skills remain diminished. ROS:   14 system review of systems is positive for confusion only and all other systems negative  PMH:  Past Medical History  Diagnosis Date  . Diabetes mellitus without complication (HCC)   . Hypertension   . Stroke (HCC)   . CHF (congestive heart failure) (HCC)   . Pacemaker     Social History:  Social History   Social History  . Marital Status: Widowed    Spouse Name: N/A  . Number of Children: N/A  . Years of Education: N/A   Occupational History  . Not on file.   Social History Main Topics  . Smoking status: Former Games developer  . Smokeless tobacco:  Never Used  . Alcohol Use: No  . Drug Use: No  . Sexual Activity: Not on file   Other Topics Concern  . Not on file   Social History Narrative    Medications:   Current Outpatient Prescriptions on File Prior to Visit  Medication Sig Dispense Refill  . apixaban (ELIQUIS) 5 MG TABS tablet Take 1 tablet (5 mg total) by mouth 2 (two) times daily. 60 tablet 2  .  atorvastatin (LIPITOR) 20 MG tablet Take 1 tablet (20 mg total) by mouth daily at 6 PM. 30 tablet 2  . bimatoprost (LUMIGAN) 0.01 % SOLN Place 1 drop into both eyes at bedtime.    . carvedilol (COREG) 6.25 MG tablet Take 1 tablet (6.25 mg total) by mouth 2 (two) times daily with a meal. 60 tablet 2  . cholecalciferol (VITAMIN D) 1000 UNITS tablet Take 2,000 Units by mouth daily.    . hydrochlorothiazide (HYDRODIURIL) 25 MG tablet Take 25 mg by mouth daily.    Marland Kitchen lisinopril (PRINIVIL,ZESTRIL) 5 MG tablet Take 1 tablet (5 mg total) by mouth daily. 30 tablet 2  . metFORMIN (GLUCOPHAGE) 500 MG tablet Take 500 mg by mouth 2 (two) times daily with a meal.    . neomycin-bacitracin-polymyxin (NEOSPORIN) OINT Apply 1 application topically daily. Apply Neosporin to left leg wound Q day, then cover with foam dressing.  Change foam dressing Q 5 days or PRN soiling. 1 Tube prn  . potassium chloride (K-DUR,KLOR-CON) 10 MEQ tablet Take 10 mEq by mouth daily.     No current facility-administered medications on file prior to visit.    Allergies:  No Known Allergies  Physical Exam General: well developed, well nourished elderly Caucasian lady, seated, in no evident distress Head: head normocephalic and atraumatic.  Neck: supple with no carotid or supraclavicular bruits Cardiovascular: regular rate and rhythm, no murmurs Musculoskeletal: no deformity Skin:  no rash/petichiae Vascular:  Normal pulses all extremities Filed Vitals:   03/16/15 1308  BP: 136/96  Pulse: 69   Neurologic Exam Mental Status: Awake and fully alert. Oriented to place and time. Recent and remote memory intact. Attention span, concentration and fund of knowledge appropriate. Mood and affect appropriate.  Cranial Nerves: Fundoscopic exam reveals sharp disc margins. Pupils equal, briskly reactive to light. Extraocular movements full without nystagmus. Visual fields full to confrontation. Hearing intact. Facial sensation intact. Face,  tongue, palate moves normally and symmetrically.  Motor: Normal bulk and tone. Normal strength in all tested extremity muscles. Diminished fine foam finger movements on the left. Orbits right over left approximately. Sensory.: intact to touch ,pinprick .position and vibratory sensation.  Coordination: Rapid alternating movements normal in all extremities. Finger-to-nose and heel-to-shin performed accurately bilaterally. Gait and Station: Arises from chair without difficulty. Stance is normal. Gait demonstrates normal stride length and balance . Unable to heel, toe and tandem walk without difficulty.  Reflexes: 1+ and symmetric. Toes downgoing.   NIHSS 0 Modified Rankin  1   ASSESSMENT: 89 year Caucasian lady due to right frontal MCA branch infarct of embolic etiology due to atrial fibrillation in August 2016. Multiple vascular risk factors of atrial fibrillation, cardiomyopathy, hypertension, hyperlipidemia, diabetes    PLAN: I had a long d/w patient and daughter about her recent stroke, risk for recurrent stroke/TIAs, personally independently reviewed imaging studies and stroke evaluation results and answered questions.Continue Apixaban  for secondary stroke prevention  For atrial fibrillation and maintain strict control of hypertension with blood pressure goal below 130/90, diabetes with hemoglobin  A1c goal below 6.5% and lipids with LDL cholesterol goal below 100 mg/dL. I also advised the patient to eat a healthy diet with plenty of whole grains, cereals, fruits and vegetables, exercise regularly and maintain ideal body weight .I have also advised the patient to use a Berndt at all times to avoid falls and discussed fall risk prevention and home safety precautions.Greater than 50% of time during this 25 minute visit was spent on counseling,explanation of diagnosis, planning of further management, discussion with patient and family and coordination of care Followup in the future with me in 6  months or call earlier if necessary.   Delia Heady, MD  Note: This document was prepared with digital dictation and possible smart phrase technology. Any transcriptional errors that result from this process are unintentional

## 2015-03-17 ENCOUNTER — Ambulatory Visit: Payer: Self-pay | Admitting: Neurology

## 2015-03-23 DIAGNOSIS — L97221 Non-pressure chronic ulcer of left calf limited to breakdown of skin: Secondary | ICD-10-CM | POA: Diagnosis not present

## 2015-03-23 DIAGNOSIS — I10 Essential (primary) hypertension: Secondary | ICD-10-CM | POA: Diagnosis not present

## 2015-03-23 DIAGNOSIS — I4891 Unspecified atrial fibrillation: Secondary | ICD-10-CM | POA: Diagnosis not present

## 2015-03-23 DIAGNOSIS — S80821D Blister (nonthermal), right lower leg, subsequent encounter: Secondary | ICD-10-CM | POA: Diagnosis not present

## 2015-03-23 DIAGNOSIS — I69354 Hemiplegia and hemiparesis following cerebral infarction affecting left non-dominant side: Secondary | ICD-10-CM | POA: Diagnosis not present

## 2015-03-23 DIAGNOSIS — E119 Type 2 diabetes mellitus without complications: Secondary | ICD-10-CM | POA: Diagnosis not present

## 2015-03-29 DIAGNOSIS — I4891 Unspecified atrial fibrillation: Secondary | ICD-10-CM | POA: Diagnosis not present

## 2015-03-29 DIAGNOSIS — Z9181 History of falling: Secondary | ICD-10-CM | POA: Diagnosis not present

## 2015-03-29 DIAGNOSIS — S80821D Blister (nonthermal), right lower leg, subsequent encounter: Secondary | ICD-10-CM | POA: Diagnosis not present

## 2015-03-29 DIAGNOSIS — L97221 Non-pressure chronic ulcer of left calf limited to breakdown of skin: Secondary | ICD-10-CM | POA: Diagnosis not present

## 2015-03-29 DIAGNOSIS — E1159 Type 2 diabetes mellitus with other circulatory complications: Secondary | ICD-10-CM | POA: Diagnosis not present

## 2015-03-29 DIAGNOSIS — I10 Essential (primary) hypertension: Secondary | ICD-10-CM | POA: Diagnosis not present

## 2015-03-29 DIAGNOSIS — Z950 Presence of cardiac pacemaker: Secondary | ICD-10-CM | POA: Diagnosis not present

## 2015-03-29 DIAGNOSIS — I69354 Hemiplegia and hemiparesis following cerebral infarction affecting left non-dominant side: Secondary | ICD-10-CM | POA: Diagnosis not present

## 2015-03-29 DIAGNOSIS — E785 Hyperlipidemia, unspecified: Secondary | ICD-10-CM | POA: Diagnosis not present

## 2015-04-02 DIAGNOSIS — Z950 Presence of cardiac pacemaker: Secondary | ICD-10-CM | POA: Diagnosis not present

## 2015-04-02 DIAGNOSIS — Z9181 History of falling: Secondary | ICD-10-CM | POA: Diagnosis not present

## 2015-04-02 DIAGNOSIS — L97221 Non-pressure chronic ulcer of left calf limited to breakdown of skin: Secondary | ICD-10-CM | POA: Diagnosis not present

## 2015-04-02 DIAGNOSIS — E1159 Type 2 diabetes mellitus with other circulatory complications: Secondary | ICD-10-CM | POA: Diagnosis not present

## 2015-04-02 DIAGNOSIS — S80821D Blister (nonthermal), right lower leg, subsequent encounter: Secondary | ICD-10-CM | POA: Diagnosis not present

## 2015-04-02 DIAGNOSIS — I4891 Unspecified atrial fibrillation: Secondary | ICD-10-CM | POA: Diagnosis not present

## 2015-04-02 DIAGNOSIS — I69354 Hemiplegia and hemiparesis following cerebral infarction affecting left non-dominant side: Secondary | ICD-10-CM | POA: Diagnosis not present

## 2015-04-02 DIAGNOSIS — E785 Hyperlipidemia, unspecified: Secondary | ICD-10-CM | POA: Diagnosis not present

## 2015-04-02 DIAGNOSIS — I10 Essential (primary) hypertension: Secondary | ICD-10-CM | POA: Diagnosis not present

## 2015-04-06 DIAGNOSIS — L97221 Non-pressure chronic ulcer of left calf limited to breakdown of skin: Secondary | ICD-10-CM | POA: Diagnosis not present

## 2015-04-06 DIAGNOSIS — E785 Hyperlipidemia, unspecified: Secondary | ICD-10-CM | POA: Diagnosis not present

## 2015-04-06 DIAGNOSIS — Z9181 History of falling: Secondary | ICD-10-CM | POA: Diagnosis not present

## 2015-04-06 DIAGNOSIS — I10 Essential (primary) hypertension: Secondary | ICD-10-CM | POA: Diagnosis not present

## 2015-04-06 DIAGNOSIS — Z950 Presence of cardiac pacemaker: Secondary | ICD-10-CM | POA: Diagnosis not present

## 2015-04-06 DIAGNOSIS — I4891 Unspecified atrial fibrillation: Secondary | ICD-10-CM | POA: Diagnosis not present

## 2015-04-06 DIAGNOSIS — I69354 Hemiplegia and hemiparesis following cerebral infarction affecting left non-dominant side: Secondary | ICD-10-CM | POA: Diagnosis not present

## 2015-04-06 DIAGNOSIS — S80821D Blister (nonthermal), right lower leg, subsequent encounter: Secondary | ICD-10-CM | POA: Diagnosis not present

## 2015-04-06 DIAGNOSIS — E1159 Type 2 diabetes mellitus with other circulatory complications: Secondary | ICD-10-CM | POA: Diagnosis not present

## 2015-04-13 DIAGNOSIS — L97221 Non-pressure chronic ulcer of left calf limited to breakdown of skin: Secondary | ICD-10-CM | POA: Diagnosis not present

## 2015-04-13 DIAGNOSIS — I69354 Hemiplegia and hemiparesis following cerebral infarction affecting left non-dominant side: Secondary | ICD-10-CM | POA: Diagnosis not present

## 2015-04-13 DIAGNOSIS — E1159 Type 2 diabetes mellitus with other circulatory complications: Secondary | ICD-10-CM | POA: Diagnosis not present

## 2015-04-13 DIAGNOSIS — S80821D Blister (nonthermal), right lower leg, subsequent encounter: Secondary | ICD-10-CM | POA: Diagnosis not present

## 2015-04-13 DIAGNOSIS — I10 Essential (primary) hypertension: Secondary | ICD-10-CM | POA: Diagnosis not present

## 2015-04-13 DIAGNOSIS — Z9181 History of falling: Secondary | ICD-10-CM | POA: Diagnosis not present

## 2015-04-13 DIAGNOSIS — Z950 Presence of cardiac pacemaker: Secondary | ICD-10-CM | POA: Diagnosis not present

## 2015-04-13 DIAGNOSIS — E785 Hyperlipidemia, unspecified: Secondary | ICD-10-CM | POA: Diagnosis not present

## 2015-04-13 DIAGNOSIS — I4891 Unspecified atrial fibrillation: Secondary | ICD-10-CM | POA: Diagnosis not present

## 2015-04-20 DIAGNOSIS — Z9181 History of falling: Secondary | ICD-10-CM | POA: Diagnosis not present

## 2015-04-20 DIAGNOSIS — L97221 Non-pressure chronic ulcer of left calf limited to breakdown of skin: Secondary | ICD-10-CM | POA: Diagnosis not present

## 2015-04-20 DIAGNOSIS — I69354 Hemiplegia and hemiparesis following cerebral infarction affecting left non-dominant side: Secondary | ICD-10-CM | POA: Diagnosis not present

## 2015-04-20 DIAGNOSIS — E785 Hyperlipidemia, unspecified: Secondary | ICD-10-CM | POA: Diagnosis not present

## 2015-04-20 DIAGNOSIS — E1159 Type 2 diabetes mellitus with other circulatory complications: Secondary | ICD-10-CM | POA: Diagnosis not present

## 2015-04-20 DIAGNOSIS — Z950 Presence of cardiac pacemaker: Secondary | ICD-10-CM | POA: Diagnosis not present

## 2015-04-20 DIAGNOSIS — I10 Essential (primary) hypertension: Secondary | ICD-10-CM | POA: Diagnosis not present

## 2015-04-20 DIAGNOSIS — S80821D Blister (nonthermal), right lower leg, subsequent encounter: Secondary | ICD-10-CM | POA: Diagnosis not present

## 2015-04-20 DIAGNOSIS — I4891 Unspecified atrial fibrillation: Secondary | ICD-10-CM | POA: Diagnosis not present

## 2015-04-27 DIAGNOSIS — L97221 Non-pressure chronic ulcer of left calf limited to breakdown of skin: Secondary | ICD-10-CM | POA: Diagnosis not present

## 2015-04-27 DIAGNOSIS — Z950 Presence of cardiac pacemaker: Secondary | ICD-10-CM | POA: Diagnosis not present

## 2015-04-27 DIAGNOSIS — I4891 Unspecified atrial fibrillation: Secondary | ICD-10-CM | POA: Diagnosis not present

## 2015-04-27 DIAGNOSIS — Z9181 History of falling: Secondary | ICD-10-CM | POA: Diagnosis not present

## 2015-04-27 DIAGNOSIS — I69354 Hemiplegia and hemiparesis following cerebral infarction affecting left non-dominant side: Secondary | ICD-10-CM | POA: Diagnosis not present

## 2015-04-27 DIAGNOSIS — E785 Hyperlipidemia, unspecified: Secondary | ICD-10-CM | POA: Diagnosis not present

## 2015-04-27 DIAGNOSIS — I10 Essential (primary) hypertension: Secondary | ICD-10-CM | POA: Diagnosis not present

## 2015-04-27 DIAGNOSIS — E1159 Type 2 diabetes mellitus with other circulatory complications: Secondary | ICD-10-CM | POA: Diagnosis not present

## 2015-04-27 DIAGNOSIS — S80821D Blister (nonthermal), right lower leg, subsequent encounter: Secondary | ICD-10-CM | POA: Diagnosis not present

## 2015-05-03 DIAGNOSIS — E785 Hyperlipidemia, unspecified: Secondary | ICD-10-CM | POA: Diagnosis not present

## 2015-05-03 DIAGNOSIS — E1159 Type 2 diabetes mellitus with other circulatory complications: Secondary | ICD-10-CM | POA: Diagnosis not present

## 2015-05-03 DIAGNOSIS — L97221 Non-pressure chronic ulcer of left calf limited to breakdown of skin: Secondary | ICD-10-CM | POA: Diagnosis not present

## 2015-05-03 DIAGNOSIS — Z950 Presence of cardiac pacemaker: Secondary | ICD-10-CM | POA: Diagnosis not present

## 2015-05-03 DIAGNOSIS — Z9181 History of falling: Secondary | ICD-10-CM | POA: Diagnosis not present

## 2015-05-03 DIAGNOSIS — I4891 Unspecified atrial fibrillation: Secondary | ICD-10-CM | POA: Diagnosis not present

## 2015-05-03 DIAGNOSIS — S80821D Blister (nonthermal), right lower leg, subsequent encounter: Secondary | ICD-10-CM | POA: Diagnosis not present

## 2015-05-03 DIAGNOSIS — I69354 Hemiplegia and hemiparesis following cerebral infarction affecting left non-dominant side: Secondary | ICD-10-CM | POA: Diagnosis not present

## 2015-05-03 DIAGNOSIS — I10 Essential (primary) hypertension: Secondary | ICD-10-CM | POA: Diagnosis not present

## 2015-05-04 DIAGNOSIS — E1142 Type 2 diabetes mellitus with diabetic polyneuropathy: Secondary | ICD-10-CM | POA: Diagnosis not present

## 2015-05-04 DIAGNOSIS — B351 Tinea unguium: Secondary | ICD-10-CM | POA: Diagnosis not present

## 2015-05-11 DIAGNOSIS — L97221 Non-pressure chronic ulcer of left calf limited to breakdown of skin: Secondary | ICD-10-CM | POA: Diagnosis not present

## 2015-05-11 DIAGNOSIS — S80821D Blister (nonthermal), right lower leg, subsequent encounter: Secondary | ICD-10-CM | POA: Diagnosis not present

## 2015-05-11 DIAGNOSIS — Z9181 History of falling: Secondary | ICD-10-CM | POA: Diagnosis not present

## 2015-05-11 DIAGNOSIS — Z950 Presence of cardiac pacemaker: Secondary | ICD-10-CM | POA: Diagnosis not present

## 2015-05-11 DIAGNOSIS — I10 Essential (primary) hypertension: Secondary | ICD-10-CM | POA: Diagnosis not present

## 2015-05-11 DIAGNOSIS — E1159 Type 2 diabetes mellitus with other circulatory complications: Secondary | ICD-10-CM | POA: Diagnosis not present

## 2015-05-11 DIAGNOSIS — I69354 Hemiplegia and hemiparesis following cerebral infarction affecting left non-dominant side: Secondary | ICD-10-CM | POA: Diagnosis not present

## 2015-05-11 DIAGNOSIS — I4891 Unspecified atrial fibrillation: Secondary | ICD-10-CM | POA: Diagnosis not present

## 2015-05-11 DIAGNOSIS — E785 Hyperlipidemia, unspecified: Secondary | ICD-10-CM | POA: Diagnosis not present

## 2015-05-18 DIAGNOSIS — S80821D Blister (nonthermal), right lower leg, subsequent encounter: Secondary | ICD-10-CM | POA: Diagnosis not present

## 2015-05-18 DIAGNOSIS — I10 Essential (primary) hypertension: Secondary | ICD-10-CM | POA: Diagnosis not present

## 2015-05-18 DIAGNOSIS — E785 Hyperlipidemia, unspecified: Secondary | ICD-10-CM | POA: Diagnosis not present

## 2015-05-18 DIAGNOSIS — Z950 Presence of cardiac pacemaker: Secondary | ICD-10-CM | POA: Diagnosis not present

## 2015-05-18 DIAGNOSIS — L97221 Non-pressure chronic ulcer of left calf limited to breakdown of skin: Secondary | ICD-10-CM | POA: Diagnosis not present

## 2015-05-18 DIAGNOSIS — I69354 Hemiplegia and hemiparesis following cerebral infarction affecting left non-dominant side: Secondary | ICD-10-CM | POA: Diagnosis not present

## 2015-05-18 DIAGNOSIS — I4891 Unspecified atrial fibrillation: Secondary | ICD-10-CM | POA: Diagnosis not present

## 2015-05-18 DIAGNOSIS — H401132 Primary open-angle glaucoma, bilateral, moderate stage: Secondary | ICD-10-CM | POA: Diagnosis not present

## 2015-05-18 DIAGNOSIS — Z9181 History of falling: Secondary | ICD-10-CM | POA: Diagnosis not present

## 2015-05-18 DIAGNOSIS — E1159 Type 2 diabetes mellitus with other circulatory complications: Secondary | ICD-10-CM | POA: Diagnosis not present

## 2015-05-26 DIAGNOSIS — I4891 Unspecified atrial fibrillation: Secondary | ICD-10-CM | POA: Diagnosis not present

## 2015-05-26 DIAGNOSIS — L97221 Non-pressure chronic ulcer of left calf limited to breakdown of skin: Secondary | ICD-10-CM | POA: Diagnosis not present

## 2015-05-26 DIAGNOSIS — Z950 Presence of cardiac pacemaker: Secondary | ICD-10-CM | POA: Diagnosis not present

## 2015-05-26 DIAGNOSIS — E785 Hyperlipidemia, unspecified: Secondary | ICD-10-CM | POA: Diagnosis not present

## 2015-05-26 DIAGNOSIS — Z9181 History of falling: Secondary | ICD-10-CM | POA: Diagnosis not present

## 2015-05-26 DIAGNOSIS — E1159 Type 2 diabetes mellitus with other circulatory complications: Secondary | ICD-10-CM | POA: Diagnosis not present

## 2015-05-26 DIAGNOSIS — I69354 Hemiplegia and hemiparesis following cerebral infarction affecting left non-dominant side: Secondary | ICD-10-CM | POA: Diagnosis not present

## 2015-05-26 DIAGNOSIS — I10 Essential (primary) hypertension: Secondary | ICD-10-CM | POA: Diagnosis not present

## 2015-05-26 DIAGNOSIS — S80821D Blister (nonthermal), right lower leg, subsequent encounter: Secondary | ICD-10-CM | POA: Diagnosis not present

## 2015-06-01 DIAGNOSIS — I69354 Hemiplegia and hemiparesis following cerebral infarction affecting left non-dominant side: Secondary | ICD-10-CM | POA: Diagnosis not present

## 2015-06-01 DIAGNOSIS — E1159 Type 2 diabetes mellitus with other circulatory complications: Secondary | ICD-10-CM | POA: Diagnosis not present

## 2015-06-01 DIAGNOSIS — I4891 Unspecified atrial fibrillation: Secondary | ICD-10-CM | POA: Diagnosis not present

## 2015-06-01 DIAGNOSIS — Z950 Presence of cardiac pacemaker: Secondary | ICD-10-CM | POA: Diagnosis not present

## 2015-06-01 DIAGNOSIS — I10 Essential (primary) hypertension: Secondary | ICD-10-CM | POA: Diagnosis not present

## 2015-06-01 DIAGNOSIS — E785 Hyperlipidemia, unspecified: Secondary | ICD-10-CM | POA: Diagnosis not present

## 2015-06-01 DIAGNOSIS — Z9181 History of falling: Secondary | ICD-10-CM | POA: Diagnosis not present

## 2015-06-01 DIAGNOSIS — L97221 Non-pressure chronic ulcer of left calf limited to breakdown of skin: Secondary | ICD-10-CM | POA: Diagnosis not present

## 2015-06-08 DIAGNOSIS — Z950 Presence of cardiac pacemaker: Secondary | ICD-10-CM | POA: Diagnosis not present

## 2015-06-08 DIAGNOSIS — I69354 Hemiplegia and hemiparesis following cerebral infarction affecting left non-dominant side: Secondary | ICD-10-CM | POA: Diagnosis not present

## 2015-06-08 DIAGNOSIS — E785 Hyperlipidemia, unspecified: Secondary | ICD-10-CM | POA: Diagnosis not present

## 2015-06-08 DIAGNOSIS — E1159 Type 2 diabetes mellitus with other circulatory complications: Secondary | ICD-10-CM | POA: Diagnosis not present

## 2015-06-08 DIAGNOSIS — I4891 Unspecified atrial fibrillation: Secondary | ICD-10-CM | POA: Diagnosis not present

## 2015-06-08 DIAGNOSIS — Z9181 History of falling: Secondary | ICD-10-CM | POA: Diagnosis not present

## 2015-06-08 DIAGNOSIS — L97221 Non-pressure chronic ulcer of left calf limited to breakdown of skin: Secondary | ICD-10-CM | POA: Diagnosis not present

## 2015-06-08 DIAGNOSIS — I10 Essential (primary) hypertension: Secondary | ICD-10-CM | POA: Diagnosis not present

## 2015-06-09 DIAGNOSIS — I639 Cerebral infarction, unspecified: Secondary | ICD-10-CM | POA: Diagnosis not present

## 2015-06-09 DIAGNOSIS — E119 Type 2 diabetes mellitus without complications: Secondary | ICD-10-CM | POA: Diagnosis not present

## 2015-06-09 DIAGNOSIS — I10 Essential (primary) hypertension: Secondary | ICD-10-CM | POA: Diagnosis not present

## 2015-06-15 DIAGNOSIS — Z9181 History of falling: Secondary | ICD-10-CM | POA: Diagnosis not present

## 2015-06-15 DIAGNOSIS — I4891 Unspecified atrial fibrillation: Secondary | ICD-10-CM | POA: Diagnosis not present

## 2015-06-15 DIAGNOSIS — E785 Hyperlipidemia, unspecified: Secondary | ICD-10-CM | POA: Diagnosis not present

## 2015-06-15 DIAGNOSIS — I69354 Hemiplegia and hemiparesis following cerebral infarction affecting left non-dominant side: Secondary | ICD-10-CM | POA: Diagnosis not present

## 2015-06-15 DIAGNOSIS — I10 Essential (primary) hypertension: Secondary | ICD-10-CM | POA: Diagnosis not present

## 2015-06-15 DIAGNOSIS — L97221 Non-pressure chronic ulcer of left calf limited to breakdown of skin: Secondary | ICD-10-CM | POA: Diagnosis not present

## 2015-06-15 DIAGNOSIS — Z950 Presence of cardiac pacemaker: Secondary | ICD-10-CM | POA: Diagnosis not present

## 2015-06-15 DIAGNOSIS — E1159 Type 2 diabetes mellitus with other circulatory complications: Secondary | ICD-10-CM | POA: Diagnosis not present

## 2015-06-22 DIAGNOSIS — M1990 Unspecified osteoarthritis, unspecified site: Secondary | ICD-10-CM | POA: Diagnosis not present

## 2015-06-22 DIAGNOSIS — I890 Lymphedema, not elsewhere classified: Secondary | ICD-10-CM | POA: Diagnosis not present

## 2015-06-22 DIAGNOSIS — E785 Hyperlipidemia, unspecified: Secondary | ICD-10-CM | POA: Diagnosis not present

## 2015-06-22 DIAGNOSIS — E1159 Type 2 diabetes mellitus with other circulatory complications: Secondary | ICD-10-CM | POA: Diagnosis not present

## 2015-06-22 DIAGNOSIS — M7989 Other specified soft tissue disorders: Secondary | ICD-10-CM | POA: Diagnosis not present

## 2015-06-22 DIAGNOSIS — I639 Cerebral infarction, unspecified: Secondary | ICD-10-CM | POA: Diagnosis not present

## 2015-06-22 DIAGNOSIS — Z950 Presence of cardiac pacemaker: Secondary | ICD-10-CM | POA: Diagnosis not present

## 2015-06-22 DIAGNOSIS — I10 Essential (primary) hypertension: Secondary | ICD-10-CM | POA: Diagnosis not present

## 2015-06-22 DIAGNOSIS — L97221 Non-pressure chronic ulcer of left calf limited to breakdown of skin: Secondary | ICD-10-CM | POA: Diagnosis not present

## 2015-06-22 DIAGNOSIS — I70243 Atherosclerosis of native arteries of left leg with ulceration of ankle: Secondary | ICD-10-CM | POA: Diagnosis not present

## 2015-06-22 DIAGNOSIS — I69354 Hemiplegia and hemiparesis following cerebral infarction affecting left non-dominant side: Secondary | ICD-10-CM | POA: Diagnosis not present

## 2015-06-22 DIAGNOSIS — I83223 Varicose veins of left lower extremity with both ulcer of ankle and inflammation: Secondary | ICD-10-CM | POA: Diagnosis not present

## 2015-06-22 DIAGNOSIS — I4891 Unspecified atrial fibrillation: Secondary | ICD-10-CM | POA: Diagnosis not present

## 2015-06-22 DIAGNOSIS — Z9181 History of falling: Secondary | ICD-10-CM | POA: Diagnosis not present

## 2015-06-22 DIAGNOSIS — I872 Venous insufficiency (chronic) (peripheral): Secondary | ICD-10-CM | POA: Diagnosis not present

## 2015-06-29 DIAGNOSIS — Z950 Presence of cardiac pacemaker: Secondary | ICD-10-CM | POA: Diagnosis not present

## 2015-06-29 DIAGNOSIS — E1159 Type 2 diabetes mellitus with other circulatory complications: Secondary | ICD-10-CM | POA: Diagnosis not present

## 2015-06-29 DIAGNOSIS — Z9181 History of falling: Secondary | ICD-10-CM | POA: Diagnosis not present

## 2015-06-29 DIAGNOSIS — I10 Essential (primary) hypertension: Secondary | ICD-10-CM | POA: Diagnosis not present

## 2015-06-29 DIAGNOSIS — E785 Hyperlipidemia, unspecified: Secondary | ICD-10-CM | POA: Diagnosis not present

## 2015-06-29 DIAGNOSIS — I4891 Unspecified atrial fibrillation: Secondary | ICD-10-CM | POA: Diagnosis not present

## 2015-06-29 DIAGNOSIS — I69354 Hemiplegia and hemiparesis following cerebral infarction affecting left non-dominant side: Secondary | ICD-10-CM | POA: Diagnosis not present

## 2015-06-29 DIAGNOSIS — L97221 Non-pressure chronic ulcer of left calf limited to breakdown of skin: Secondary | ICD-10-CM | POA: Diagnosis not present

## 2015-07-05 DIAGNOSIS — L97221 Non-pressure chronic ulcer of left calf limited to breakdown of skin: Secondary | ICD-10-CM | POA: Diagnosis not present

## 2015-07-05 DIAGNOSIS — I69354 Hemiplegia and hemiparesis following cerebral infarction affecting left non-dominant side: Secondary | ICD-10-CM | POA: Diagnosis not present

## 2015-07-05 DIAGNOSIS — Z950 Presence of cardiac pacemaker: Secondary | ICD-10-CM | POA: Diagnosis not present

## 2015-07-05 DIAGNOSIS — Z9181 History of falling: Secondary | ICD-10-CM | POA: Diagnosis not present

## 2015-07-05 DIAGNOSIS — I10 Essential (primary) hypertension: Secondary | ICD-10-CM | POA: Diagnosis not present

## 2015-07-05 DIAGNOSIS — I4891 Unspecified atrial fibrillation: Secondary | ICD-10-CM | POA: Diagnosis not present

## 2015-07-05 DIAGNOSIS — E785 Hyperlipidemia, unspecified: Secondary | ICD-10-CM | POA: Diagnosis not present

## 2015-07-05 DIAGNOSIS — E1159 Type 2 diabetes mellitus with other circulatory complications: Secondary | ICD-10-CM | POA: Diagnosis not present

## 2015-07-06 DIAGNOSIS — M1990 Unspecified osteoarthritis, unspecified site: Secondary | ICD-10-CM | POA: Diagnosis not present

## 2015-07-06 DIAGNOSIS — I639 Cerebral infarction, unspecified: Secondary | ICD-10-CM | POA: Diagnosis not present

## 2015-07-06 DIAGNOSIS — I890 Lymphedema, not elsewhere classified: Secondary | ICD-10-CM | POA: Diagnosis not present

## 2015-07-06 DIAGNOSIS — M79609 Pain in unspecified limb: Secondary | ICD-10-CM | POA: Diagnosis not present

## 2015-07-06 DIAGNOSIS — I70245 Atherosclerosis of native arteries of left leg with ulceration of other part of foot: Secondary | ICD-10-CM | POA: Diagnosis not present

## 2015-07-06 DIAGNOSIS — I872 Venous insufficiency (chronic) (peripheral): Secondary | ICD-10-CM | POA: Diagnosis not present

## 2015-07-06 DIAGNOSIS — M7989 Other specified soft tissue disorders: Secondary | ICD-10-CM | POA: Diagnosis not present

## 2015-07-06 DIAGNOSIS — I83223 Varicose veins of left lower extremity with both ulcer of ankle and inflammation: Secondary | ICD-10-CM | POA: Diagnosis not present

## 2015-07-10 DIAGNOSIS — E1159 Type 2 diabetes mellitus with other circulatory complications: Secondary | ICD-10-CM | POA: Diagnosis not present

## 2015-07-10 DIAGNOSIS — L97221 Non-pressure chronic ulcer of left calf limited to breakdown of skin: Secondary | ICD-10-CM | POA: Diagnosis not present

## 2015-07-10 DIAGNOSIS — N390 Urinary tract infection, site not specified: Secondary | ICD-10-CM | POA: Diagnosis not present

## 2015-07-13 DIAGNOSIS — I69354 Hemiplegia and hemiparesis following cerebral infarction affecting left non-dominant side: Secondary | ICD-10-CM | POA: Diagnosis not present

## 2015-07-13 DIAGNOSIS — L97221 Non-pressure chronic ulcer of left calf limited to breakdown of skin: Secondary | ICD-10-CM | POA: Diagnosis not present

## 2015-07-13 DIAGNOSIS — E1159 Type 2 diabetes mellitus with other circulatory complications: Secondary | ICD-10-CM | POA: Diagnosis not present

## 2015-07-13 DIAGNOSIS — Z9181 History of falling: Secondary | ICD-10-CM | POA: Diagnosis not present

## 2015-07-13 DIAGNOSIS — E785 Hyperlipidemia, unspecified: Secondary | ICD-10-CM | POA: Diagnosis not present

## 2015-07-13 DIAGNOSIS — I4891 Unspecified atrial fibrillation: Secondary | ICD-10-CM | POA: Diagnosis not present

## 2015-07-13 DIAGNOSIS — Z950 Presence of cardiac pacemaker: Secondary | ICD-10-CM | POA: Diagnosis not present

## 2015-07-13 DIAGNOSIS — I10 Essential (primary) hypertension: Secondary | ICD-10-CM | POA: Diagnosis not present

## 2015-07-20 DIAGNOSIS — Z9181 History of falling: Secondary | ICD-10-CM | POA: Diagnosis not present

## 2015-07-20 DIAGNOSIS — E1159 Type 2 diabetes mellitus with other circulatory complications: Secondary | ICD-10-CM | POA: Diagnosis not present

## 2015-07-20 DIAGNOSIS — I10 Essential (primary) hypertension: Secondary | ICD-10-CM | POA: Diagnosis not present

## 2015-07-20 DIAGNOSIS — I480 Paroxysmal atrial fibrillation: Secondary | ICD-10-CM | POA: Diagnosis not present

## 2015-07-20 DIAGNOSIS — E785 Hyperlipidemia, unspecified: Secondary | ICD-10-CM | POA: Diagnosis not present

## 2015-07-20 DIAGNOSIS — L97221 Non-pressure chronic ulcer of left calf limited to breakdown of skin: Secondary | ICD-10-CM | POA: Diagnosis not present

## 2015-07-20 DIAGNOSIS — I69354 Hemiplegia and hemiparesis following cerebral infarction affecting left non-dominant side: Secondary | ICD-10-CM | POA: Diagnosis not present

## 2015-07-20 DIAGNOSIS — I4891 Unspecified atrial fibrillation: Secondary | ICD-10-CM | POA: Diagnosis not present

## 2015-07-20 DIAGNOSIS — Z950 Presence of cardiac pacemaker: Secondary | ICD-10-CM | POA: Diagnosis not present

## 2015-07-27 DIAGNOSIS — I10 Essential (primary) hypertension: Secondary | ICD-10-CM | POA: Diagnosis not present

## 2015-07-27 DIAGNOSIS — E785 Hyperlipidemia, unspecified: Secondary | ICD-10-CM | POA: Diagnosis not present

## 2015-07-27 DIAGNOSIS — E1142 Type 2 diabetes mellitus with diabetic polyneuropathy: Secondary | ICD-10-CM | POA: Diagnosis not present

## 2015-07-27 DIAGNOSIS — I4891 Unspecified atrial fibrillation: Secondary | ICD-10-CM | POA: Diagnosis not present

## 2015-07-27 DIAGNOSIS — Z9181 History of falling: Secondary | ICD-10-CM | POA: Diagnosis not present

## 2015-07-27 DIAGNOSIS — Z950 Presence of cardiac pacemaker: Secondary | ICD-10-CM | POA: Diagnosis not present

## 2015-07-27 DIAGNOSIS — E1159 Type 2 diabetes mellitus with other circulatory complications: Secondary | ICD-10-CM | POA: Diagnosis not present

## 2015-07-27 DIAGNOSIS — I69354 Hemiplegia and hemiparesis following cerebral infarction affecting left non-dominant side: Secondary | ICD-10-CM | POA: Diagnosis not present

## 2015-07-27 DIAGNOSIS — Z7901 Long term (current) use of anticoagulants: Secondary | ICD-10-CM | POA: Diagnosis not present

## 2015-07-27 DIAGNOSIS — L97221 Non-pressure chronic ulcer of left calf limited to breakdown of skin: Secondary | ICD-10-CM | POA: Diagnosis not present

## 2015-07-27 DIAGNOSIS — L851 Acquired keratosis [keratoderma] palmaris et plantaris: Secondary | ICD-10-CM | POA: Diagnosis not present

## 2015-07-27 DIAGNOSIS — B351 Tinea unguium: Secondary | ICD-10-CM | POA: Diagnosis not present

## 2015-08-03 DIAGNOSIS — I4891 Unspecified atrial fibrillation: Secondary | ICD-10-CM | POA: Diagnosis not present

## 2015-08-03 DIAGNOSIS — L97221 Non-pressure chronic ulcer of left calf limited to breakdown of skin: Secondary | ICD-10-CM | POA: Diagnosis not present

## 2015-08-03 DIAGNOSIS — I69354 Hemiplegia and hemiparesis following cerebral infarction affecting left non-dominant side: Secondary | ICD-10-CM | POA: Diagnosis not present

## 2015-08-03 DIAGNOSIS — Z9181 History of falling: Secondary | ICD-10-CM | POA: Diagnosis not present

## 2015-08-03 DIAGNOSIS — Z7901 Long term (current) use of anticoagulants: Secondary | ICD-10-CM | POA: Diagnosis not present

## 2015-08-03 DIAGNOSIS — I10 Essential (primary) hypertension: Secondary | ICD-10-CM | POA: Diagnosis not present

## 2015-08-03 DIAGNOSIS — Z950 Presence of cardiac pacemaker: Secondary | ICD-10-CM | POA: Diagnosis not present

## 2015-08-03 DIAGNOSIS — E785 Hyperlipidemia, unspecified: Secondary | ICD-10-CM | POA: Diagnosis not present

## 2015-08-03 DIAGNOSIS — E1159 Type 2 diabetes mellitus with other circulatory complications: Secondary | ICD-10-CM | POA: Diagnosis not present

## 2015-08-14 DIAGNOSIS — Z9181 History of falling: Secondary | ICD-10-CM | POA: Diagnosis not present

## 2015-08-14 DIAGNOSIS — I4891 Unspecified atrial fibrillation: Secondary | ICD-10-CM | POA: Diagnosis not present

## 2015-08-14 DIAGNOSIS — Z7901 Long term (current) use of anticoagulants: Secondary | ICD-10-CM | POA: Diagnosis not present

## 2015-08-14 DIAGNOSIS — L97221 Non-pressure chronic ulcer of left calf limited to breakdown of skin: Secondary | ICD-10-CM | POA: Diagnosis not present

## 2015-08-14 DIAGNOSIS — E1159 Type 2 diabetes mellitus with other circulatory complications: Secondary | ICD-10-CM | POA: Diagnosis not present

## 2015-08-14 DIAGNOSIS — E785 Hyperlipidemia, unspecified: Secondary | ICD-10-CM | POA: Diagnosis not present

## 2015-08-14 DIAGNOSIS — Z950 Presence of cardiac pacemaker: Secondary | ICD-10-CM | POA: Diagnosis not present

## 2015-08-14 DIAGNOSIS — I69354 Hemiplegia and hemiparesis following cerebral infarction affecting left non-dominant side: Secondary | ICD-10-CM | POA: Diagnosis not present

## 2015-08-14 DIAGNOSIS — I10 Essential (primary) hypertension: Secondary | ICD-10-CM | POA: Diagnosis not present

## 2015-08-18 DIAGNOSIS — Z9181 History of falling: Secondary | ICD-10-CM | POA: Diagnosis not present

## 2015-08-18 DIAGNOSIS — Z7901 Long term (current) use of anticoagulants: Secondary | ICD-10-CM | POA: Diagnosis not present

## 2015-08-18 DIAGNOSIS — I4891 Unspecified atrial fibrillation: Secondary | ICD-10-CM | POA: Diagnosis not present

## 2015-08-18 DIAGNOSIS — E785 Hyperlipidemia, unspecified: Secondary | ICD-10-CM | POA: Diagnosis not present

## 2015-08-18 DIAGNOSIS — Z950 Presence of cardiac pacemaker: Secondary | ICD-10-CM | POA: Diagnosis not present

## 2015-08-18 DIAGNOSIS — I69354 Hemiplegia and hemiparesis following cerebral infarction affecting left non-dominant side: Secondary | ICD-10-CM | POA: Diagnosis not present

## 2015-08-18 DIAGNOSIS — E1159 Type 2 diabetes mellitus with other circulatory complications: Secondary | ICD-10-CM | POA: Diagnosis not present

## 2015-08-18 DIAGNOSIS — I10 Essential (primary) hypertension: Secondary | ICD-10-CM | POA: Diagnosis not present

## 2015-08-18 DIAGNOSIS — L97221 Non-pressure chronic ulcer of left calf limited to breakdown of skin: Secondary | ICD-10-CM | POA: Diagnosis not present

## 2015-08-25 DIAGNOSIS — Z9181 History of falling: Secondary | ICD-10-CM | POA: Diagnosis not present

## 2015-08-25 DIAGNOSIS — I4891 Unspecified atrial fibrillation: Secondary | ICD-10-CM | POA: Diagnosis not present

## 2015-08-25 DIAGNOSIS — L97221 Non-pressure chronic ulcer of left calf limited to breakdown of skin: Secondary | ICD-10-CM | POA: Diagnosis not present

## 2015-08-25 DIAGNOSIS — E785 Hyperlipidemia, unspecified: Secondary | ICD-10-CM | POA: Diagnosis not present

## 2015-08-25 DIAGNOSIS — E1159 Type 2 diabetes mellitus with other circulatory complications: Secondary | ICD-10-CM | POA: Diagnosis not present

## 2015-08-25 DIAGNOSIS — Z950 Presence of cardiac pacemaker: Secondary | ICD-10-CM | POA: Diagnosis not present

## 2015-08-25 DIAGNOSIS — I10 Essential (primary) hypertension: Secondary | ICD-10-CM | POA: Diagnosis not present

## 2015-08-25 DIAGNOSIS — Z7901 Long term (current) use of anticoagulants: Secondary | ICD-10-CM | POA: Diagnosis not present

## 2015-08-25 DIAGNOSIS — I69354 Hemiplegia and hemiparesis following cerebral infarction affecting left non-dominant side: Secondary | ICD-10-CM | POA: Diagnosis not present

## 2015-09-02 DIAGNOSIS — E785 Hyperlipidemia, unspecified: Secondary | ICD-10-CM | POA: Diagnosis not present

## 2015-09-02 DIAGNOSIS — Z9181 History of falling: Secondary | ICD-10-CM | POA: Diagnosis not present

## 2015-09-02 DIAGNOSIS — I10 Essential (primary) hypertension: Secondary | ICD-10-CM | POA: Diagnosis not present

## 2015-09-02 DIAGNOSIS — I4891 Unspecified atrial fibrillation: Secondary | ICD-10-CM | POA: Diagnosis not present

## 2015-09-02 DIAGNOSIS — Z7901 Long term (current) use of anticoagulants: Secondary | ICD-10-CM | POA: Diagnosis not present

## 2015-09-02 DIAGNOSIS — E1159 Type 2 diabetes mellitus with other circulatory complications: Secondary | ICD-10-CM | POA: Diagnosis not present

## 2015-09-02 DIAGNOSIS — L97221 Non-pressure chronic ulcer of left calf limited to breakdown of skin: Secondary | ICD-10-CM | POA: Diagnosis not present

## 2015-09-02 DIAGNOSIS — Z950 Presence of cardiac pacemaker: Secondary | ICD-10-CM | POA: Diagnosis not present

## 2015-09-02 DIAGNOSIS — I69354 Hemiplegia and hemiparesis following cerebral infarction affecting left non-dominant side: Secondary | ICD-10-CM | POA: Diagnosis not present

## 2015-09-14 ENCOUNTER — Encounter: Payer: Self-pay | Admitting: Neurology

## 2015-09-14 ENCOUNTER — Ambulatory Visit (INDEPENDENT_AMBULATORY_CARE_PROVIDER_SITE_OTHER): Payer: Commercial Managed Care - HMO | Admitting: Neurology

## 2015-09-14 VITALS — BP 160/94 | HR 72 | Ht 65.0 in | Wt 128.4 lb

## 2015-09-14 DIAGNOSIS — H401132 Primary open-angle glaucoma, bilateral, moderate stage: Secondary | ICD-10-CM | POA: Diagnosis not present

## 2015-09-14 DIAGNOSIS — I6990 Unspecified sequelae of unspecified cerebrovascular disease: Secondary | ICD-10-CM

## 2015-09-14 NOTE — Patient Instructions (Signed)
I had a long d/w patient and daughter about her recent stroke, atrial fibrillation,, risk for recurrent stroke/TIAs, personally independently reviewed imaging studies and stroke evaluation results and answered questions.Continue Eliquis for secondary stroke prevention and maintain strict control of hypertension with blood pressure goal below 130/90, diabetes with hemoglobin A1c goal below 6.5% and lipids with LDL cholesterol goal below 70 mg/dL.  I also advised patient to use a Constantine at all times to avoid falling and hurting herself. Followup in the future with me in one year or call earlier if necessary. Fall Prevention in the Home  Falls can cause injuries. They can happen to people of all ages. There are many things you can do to make your home safe and to help prevent falls.  WHAT CAN I DO ON THE OUTSIDE OF MY HOME?  Regularly fix the edges of walkways and driveways and fix any cracks.  Remove anything that might make you trip as you walk through a door, such as a raised step or threshold.  Trim any bushes or trees on the path to your home.  Use bright outdoor lighting.  Clear any walking paths of anything that might make someone trip, such as rocks or tools.  Regularly check to see if handrails are loose or broken. Make sure that both sides of any steps have handrails.  Any raised decks and porches should have guardrails on the edges.  Have any leaves, snow, or ice cleared regularly.  Use sand or salt on walking paths during winter.  Clean up any spills in your garage right away. This includes oil or grease spills. WHAT CAN I DO IN THE BATHROOM?   Use night lights.  Install grab bars by the toilet and in the tub and shower. Do not use towel bars as grab bars.  Use non-skid mats or decals in the tub or shower.  If you need to sit down in the shower, use a plastic, non-slip stool.  Keep the floor dry. Clean up any water that spills on the floor as soon as it happens.  Remove  soap buildup in the tub or shower regularly.  Attach bath mats securely with double-sided non-slip rug tape.  Do not have throw rugs and other things on the floor that can make you trip. WHAT CAN I DO IN THE BEDROOM?  Use night lights.  Make sure that you have a light by your bed that is easy to reach.  Do not use any sheets or blankets that are too big for your bed. They should not hang down onto the floor.  Have a firm chair that has side arms. You can use this for support while you get dressed.  Do not have throw rugs and other things on the floor that can make you trip. WHAT CAN I DO IN THE KITCHEN?  Clean up any spills right away.  Avoid walking on wet floors.  Keep items that you use a lot in easy-to-reach places.  If you need to reach something above you, use a strong step stool that has a grab bar.  Keep electrical cords out of the way.  Do not use floor polish or wax that makes floors slippery. If you must use wax, use non-skid floor wax.  Do not have throw rugs and other things on the floor that can make you trip. WHAT CAN I DO WITH MY STAIRS?  Do not leave any items on the stairs.  Make sure that there are handrails on both  sides of the stairs and use them. Fix handrails that are broken or loose. Make sure that handrails are as long as the stairways.  Check any carpeting to make sure that it is firmly attached to the stairs. Fix any carpet that is loose or worn.  Avoid having throw rugs at the top or bottom of the stairs. If you do have throw rugs, attach them to the floor with carpet tape.  Make sure that you have a light switch at the top of the stairs and the bottom of the stairs. If you do not have them, ask someone to add them for you. WHAT ELSE CAN I DO TO HELP PREVENT FALLS?  Wear shoes that:  Do not have high heels.  Have rubber bottoms.  Are comfortable and fit you well.  Are closed at the toe. Do not wear sandals.  If you use a  stepladder:  Make sure that it is fully opened. Do not climb a closed stepladder.  Make sure that both sides of the stepladder are locked into place.  Ask someone to hold it for you, if possible.  Clearly mark and make sure that you can see:  Any grab bars or handrails.  First and last steps.  Where the edge of each step is.  Use tools that help you move around (mobility aids) if they are needed. These include:  Canes.  Walkers.  Scooters.  Crutches.  Turn on the lights when you go into a dark area. Replace any light bulbs as soon as they burn out.  Set up your furniture so you have a clear path. Avoid moving your furniture around.  If any of your floors are uneven, fix them.  If there are any pets around you, be aware of where they are.  Review your medicines with your doctor. Some medicines can make you feel dizzy. This can increase your chance of falling. Ask your doctor what other things that you can do to help prevent falls.   This information is not intended to replace advice given to you by your health care provider. Make sure you discuss any questions you have with your health care provider.   Document Released: 03/19/2009 Document Revised: 10/07/2014 Document Reviewed: 06/27/2014 Elsevier Interactive Patient Education [employer]

## 2015-09-14 NOTE — Progress Notes (Signed)
[facility] [address] [place]. [place] [address] [phone]       OFFICE FOLLOW-UP NOTE  [name] Date of Birth:  [birth year] Medical Record Number:  [id number]   HPI: 51 year pleasant Caucasian lady seen today for first office follow-up visit following hospital admission for stroke in August 2016.Tamara Campbell is an 82 y.o. female with a history of HTN and diabetes who reported noncompliance with her antihypertensives who was at her baseline on 01/08/2015. That evening she sat down to eat supper and noted no problems. When she attempted to get up to leave the table she was unable to support herself and fell. She noted left sided weakness. (LKW 01/08/2015 at 1800). EMS was called at that time. Patient was hypertensive. En route was noted to have a starring spell when she gazed to the right and did not respond to questioning. Patient has had a stroke in the past in 2005. Patient had some mild right sided weakness as residual from this per daughter. At baseline patient lives with son. She ambulates with a Solorzano. She dresses herself and goes to the bathroom independently. She does not report incontinence but does wear Depends because she reports that she does not always make it to the bathroom. Her son does the cooking and cleaning.IV tPA was administered. Patient was admitted for further stroke evaluation. Patient was treated with IV TPA and did well. She was monitored in intensive care unit and blood pressure was tightly controlled. She showed improvement in her weakness. CT scan of the head showed right frontal infarct with mild cytotoxic edema but no hemorrhage. Transthoracic echo showed ejection fraction of 25% but no definite thrombus was noted. LDL cholesterol was slightly elevated at 100 and hemoglobin A1c was borderline at 6.3. Patient was started on aspirin 325 mg daily initially and then changed to eliquis for secondary stroke prevention. She will  discharge to skilled nursing facility for ongoing physical occupational and speech therapy needs. Patient had an pacemaker and hence MRI was not obtained. Patient was diagnosed with new-onset atrial fibrillation and cardiology was consulted and helped with management. She was started on Lipitor 20 mg daily for her elevated lipids. Patient states she's done well since discharge. She is currently living at home and had one fall. She has finished outpatient home therapy and is now living at home with her son. She is mostly independent. She has mild pre-existing memory difficulties which as per the patient and unchanged per the daughter states that this may be slightly worse. Patient states her blood pressure is well controlled today it is 136/96. Sugars are well controlled and fasting sugar today was 104. She is tolerating eliquis well without bleeding or bruising problems. She states her left hand strength has improved mostly though fine motor skills remain diminished. Update 09/14/2015 : She returns for follow-up after last visit 6 months ago. She is accompanied by her daughter. Patient continues to live alone but has good support from her family lives nearby and checks on her. She continues to have mild weakness of the left hand but she is careful and uses a Pajak at all times. She's not had any major falls but at times has difficult time getting up from a chair. She states her blood pressure is well controlled at home though it is elevated today in the office in 160/94. She states her fasting sugars have been good and last him about an A1c and lipid profile checked by her family  Physician  few months ago was satisfactory. She states her memory is also good. She is tolerating eliquis well without significant bleeding and only minor bruising. She has no complaints today. ROS:   14 system review of systems is positive for confusion only and all other systems negative  PMH:  Past Medical History  Diagnosis Date    . Diabetes mellitus without complication (HCC)   . Hypertension   . Stroke (HCC)   . CHF (congestive heart failure) (HCC)   . Pacemaker     Social History:  Social History   Social History  . Marital Status: Widowed    Spouse Name: N/A  . Number of Children: N/A  . Years of Education: N/A   Occupational History  . Not on file.   Social History Main Topics  . Smoking status: Former Games developer  . Smokeless tobacco: Never Used  . Alcohol Use: No  . Drug Use: No  . Sexual Activity: Not on file   Other Topics Concern  . Not on file   Social History Narrative    Medications:   Current Outpatient Prescriptions on File Prior to Visit  Medication Sig Dispense Refill  . Acetaminophen 500 MG coapsule Take by mouth.    Marland Kitchen apixaban (ELIQUIS) 5 MG TABS tablet Take 1 tablet (5 mg total) by mouth 2 (two) times daily. 60 tablet 2  . atorvastatin (LIPITOR) 20 MG tablet Take 1 tablet (20 mg total) by mouth daily at 6 PM. 30 tablet 2  . bimatoprost (LUMIGAN) 0.01 % SOLN Place 1 drop into both eyes at bedtime.    . bimatoprost (LUMIGAN) 0.01 % SOLN Apply to eye.    . carvedilol (COREG) 6.25 MG tablet Take 1 tablet (6.25 mg total) by mouth 2 (two) times daily with a meal. 60 tablet 2  . cholecalciferol (VITAMIN D) 1000 UNITS tablet Take 2,000 Units by mouth daily.    . hydrochlorothiazide (HYDRODIURIL) 25 MG tablet Take 25 mg by mouth daily.    Marland Kitchen lisinopril (PRINIVIL,ZESTRIL) 5 MG tablet Take 1 tablet (5 mg total) by mouth daily. 30 tablet 2  . metFORMIN (GLUCOPHAGE) 500 MG tablet Take 500 mg by mouth 2 (two) times daily with a meal.    . neomycin-bacitracin-polymyxin (NEOSPORIN) OINT Apply 1 application topically daily. Apply Neosporin to left leg wound Q day, then cover with foam dressing.  Change foam dressing Q 5 days or PRN soiling. 1 Tube prn  . potassium chloride (K-DUR,KLOR-CON) 10 MEQ tablet Take 10 mEq by mouth daily.     No current facility-administered medications on file prior to  visit.    Allergies:  No Known Allergies  Physical Exam General: frail  elderly  African american  lady, seated, in no evident distress Head: head normocephalic and atraumatic.  Neck: supple with no carotid or supraclavicular bruits Cardiovascular: regular rate and rhythm, no murmurs Musculoskeletal: no deformity Skin:  no rash/petichiae Vascular:  Normal pulses all extremities Filed Vitals:   09/14/15 1037  BP: 160/94  Pulse: 72   Neurologic Exam Mental Status: Awake and fully alert. Oriented to place and time. Recent and remote memory intact. Attention span, concentration and fund of knowledge appropriate. Mood and affect appropriate.  Cranial Nerves: Fundoscopic exam  Not done.   Pupils equal, briskly reactive to light. Extraocular movements full without nystagmus. Visual fields full to confrontation. Hearing intact. Facial sensation intact. Face, tongue, palate moves normally and symmetrically.  Motor: Normal bulk and tone. Normal strength in all tested extremity  muscles. Diminished fine foam finger movements on the left. Orbits right over left approximately. Sensory.: intact to touch ,pinprick .position and vibratory sensation.  Coordination: Rapid alternating movements normal in all extremities. Finger-to-nose and heel-to-shin performed accurately bilaterally. Gait and Station: Arises from chair with some difficulty. Stance is stooped slightly. Gait demonstrates normal stride length and balance but uses a wheeled Nazareno . Unable to heel, toe and tandem walk without difficulty.  Reflexes: 1+ and symmetric. Toes downgoing.      ASSESSMENT: [age] year Caucasian lady due to right frontal MCA branch infarct of embolic etiology due to atrial fibrillation in August 2016. Multiple vascular risk factors of atrial fibrillation, cardiomyopathy, hypertension, hyperlipidemia, diabetes    PLAN: I had a long d/w patient and daughter about her recent stroke, atrial fibrillation,, risk for  recurrent stroke/TIAs, personally independently reviewed imaging studies and stroke evaluation results and answered questions.Continue Eliquis for secondary stroke prevention and maintain strict control of hypertension with blood pressure goal below 130/90, diabetes with hemoglobin A1c goal below 6.5% and lipids with LDL cholesterol goal below 70 mg/dL.  I also advised patient to use a Hosang at all times to avoid falling and hurting herself. Followup in the future with me in one year or call earlier if necessary.  Delia HeadyPramod Keddrick Wyne, MD  Note: This document was prepared with digital dictation and possible smart phrase technology. Any transcriptional errors that result from this process are unintentional

## 2015-09-15 DIAGNOSIS — E119 Type 2 diabetes mellitus without complications: Secondary | ICD-10-CM | POA: Diagnosis not present

## 2015-09-21 DIAGNOSIS — E119 Type 2 diabetes mellitus without complications: Secondary | ICD-10-CM | POA: Diagnosis not present

## 2015-09-21 DIAGNOSIS — I10 Essential (primary) hypertension: Secondary | ICD-10-CM | POA: Diagnosis not present

## 2015-10-19 DIAGNOSIS — I459 Conduction disorder, unspecified: Secondary | ICD-10-CM | POA: Diagnosis not present

## 2015-10-26 DIAGNOSIS — E1142 Type 2 diabetes mellitus with diabetic polyneuropathy: Secondary | ICD-10-CM | POA: Diagnosis not present

## 2015-10-26 DIAGNOSIS — B351 Tinea unguium: Secondary | ICD-10-CM | POA: Diagnosis not present

## 2015-12-28 DIAGNOSIS — E119 Type 2 diabetes mellitus without complications: Secondary | ICD-10-CM | POA: Diagnosis not present

## 2016-01-18 DIAGNOSIS — I10 Essential (primary) hypertension: Secondary | ICD-10-CM | POA: Diagnosis not present

## 2016-01-18 DIAGNOSIS — Z0001 Encounter for general adult medical examination with abnormal findings: Secondary | ICD-10-CM | POA: Diagnosis not present

## 2016-01-18 DIAGNOSIS — H5210 Myopia, unspecified eye: Secondary | ICD-10-CM | POA: Diagnosis not present

## 2016-01-18 DIAGNOSIS — E119 Type 2 diabetes mellitus without complications: Secondary | ICD-10-CM | POA: Diagnosis not present

## 2016-01-18 DIAGNOSIS — H40153 Residual stage of open-angle glaucoma, bilateral: Secondary | ICD-10-CM | POA: Diagnosis not present

## 2016-01-18 DIAGNOSIS — I480 Paroxysmal atrial fibrillation: Secondary | ICD-10-CM | POA: Diagnosis not present

## 2016-01-18 DIAGNOSIS — E7800 Pure hypercholesterolemia, unspecified: Secondary | ICD-10-CM | POA: Diagnosis not present

## 2016-01-18 DIAGNOSIS — H524 Presbyopia: Secondary | ICD-10-CM | POA: Diagnosis not present

## 2016-02-15 DIAGNOSIS — L851 Acquired keratosis [keratoderma] palmaris et plantaris: Secondary | ICD-10-CM | POA: Diagnosis not present

## 2016-02-15 DIAGNOSIS — B351 Tinea unguium: Secondary | ICD-10-CM | POA: Diagnosis not present

## 2016-02-15 DIAGNOSIS — E1142 Type 2 diabetes mellitus with diabetic polyneuropathy: Secondary | ICD-10-CM | POA: Diagnosis not present

## 2016-03-12 ENCOUNTER — Emergency Department
Admission: EM | Admit: 2016-03-12 | Discharge: 2016-03-12 | Disposition: A | Discharge status: Home / Self Care | Payer: Commercial Managed Care - HMO | Attending: Emergency Medicine | Admitting: Emergency Medicine

## 2016-03-12 ENCOUNTER — Emergency Department: Payer: Commercial Managed Care - HMO

## 2016-03-12 ENCOUNTER — Encounter: Payer: Self-pay | Admitting: Emergency Medicine

## 2016-03-12 DIAGNOSIS — W06XXXA Fall from bed, initial encounter: Secondary | ICD-10-CM | POA: Insufficient documentation

## 2016-03-12 DIAGNOSIS — Y929 Unspecified place or not applicable: Secondary | ICD-10-CM | POA: Insufficient documentation

## 2016-03-12 DIAGNOSIS — E119 Type 2 diabetes mellitus without complications: Secondary | ICD-10-CM | POA: Diagnosis not present

## 2016-03-12 DIAGNOSIS — Z87891 Personal history of nicotine dependence: Secondary | ICD-10-CM | POA: Diagnosis not present

## 2016-03-12 DIAGNOSIS — Y999 Unspecified external cause status: Secondary | ICD-10-CM | POA: Diagnosis not present

## 2016-03-12 DIAGNOSIS — Z9660 Presence of unspecified orthopedic joint implant: Secondary | ICD-10-CM | POA: Diagnosis not present

## 2016-03-12 DIAGNOSIS — S0003XA Contusion of scalp, initial encounter: Secondary | ICD-10-CM | POA: Diagnosis not present

## 2016-03-12 DIAGNOSIS — Y9389 Activity, other specified: Secondary | ICD-10-CM | POA: Diagnosis not present

## 2016-03-12 DIAGNOSIS — I110 Hypertensive heart disease with heart failure: Secondary | ICD-10-CM | POA: Insufficient documentation

## 2016-03-12 DIAGNOSIS — Z950 Presence of cardiac pacemaker: Secondary | ICD-10-CM | POA: Insufficient documentation

## 2016-03-12 DIAGNOSIS — S0083XA Contusion of other part of head, initial encounter: Secondary | ICD-10-CM | POA: Diagnosis not present

## 2016-03-12 DIAGNOSIS — S060X0A Concussion without loss of consciousness, initial encounter: Secondary | ICD-10-CM | POA: Insufficient documentation

## 2016-03-12 DIAGNOSIS — Z7984 Long term (current) use of oral hypoglycemic drugs: Secondary | ICD-10-CM | POA: Diagnosis not present

## 2016-03-12 DIAGNOSIS — I509 Heart failure, unspecified: Secondary | ICD-10-CM | POA: Diagnosis not present

## 2016-03-12 DIAGNOSIS — S0990XA Unspecified injury of head, initial encounter: Secondary | ICD-10-CM | POA: Diagnosis present

## 2016-03-12 NOTE — ED Notes (Signed)
Patient reports that she was sitting on side of bed and was trying to get up and fell. Family member reports that patient hit right eye on dresser. Denies loss of consciousness, headache, n/v. Patient takes eliquis due to hx of stroke. Patient has a pacemaker and hip implants. Patient reports being more tired than usual. Patient does not appear to be in any distress at this time.

## 2016-03-12 NOTE — ED Notes (Signed)
MD at bedside. 

## 2016-03-12 NOTE — ED Notes (Signed)
Patient transported to CT 

## 2016-03-12 NOTE — ED Provider Notes (Signed)
[facility] Emergency Department Provider Note  ____________________________________________  Time seen: Approximately 5:33 PM  I have reviewed the triage vital signs and the nursing notes.   HISTORY  Chief Complaint Fall    HPI Tamara Campbell is a 82 y.o. female who complains of right forehead pain and swelling after a fall out of bed this morning. She states that she was leaning over to reach something on her bedside table when she slipped and hit her face on the table on her way to the ground. She did not lose consciousness, has not been vomiting, has no numbness tingling weakness or neck pain, but is been feeling tired all day. She was able to get herself back up off the ground and back in bed afterward.     Past Medical History:  Diagnosis Date  . CHF (congestive heart failure) (HCC)   . Diabetes mellitus without complication (HCC)   . Hypertension   . Pacemaker   . Stroke [facility])      Patient Active Problem List   Diagnosis Date Noted  . Embolic stroke (HCC) 03/16/2015  . Risk for falls 03/16/2015  . Essential hypertension 01/13/2015  . Hyperlipidemia LDL goal <70 01/13/2015  . Diabetes type 2, controlled (HCC) 01/13/2015  . Paroxysmal ventricular tachycardia (HCC) 01/13/2015  . Hypokalemia 01/13/2015  . Open wound of left lower leg 01/13/2015  . Cerebral infarction due to embolism of middle cerebral artery (HCC)   . Atrial fibrillation (HCC) 01/10/2015  . Cardiomyopathy (HCC) 01/10/2015  . CVA (cerebral infarction) 01/08/2015     Past Surgical History:  Procedure Laterality Date  . ABDOMINAL HYSTERECTOMY    . JOINT REPLACEMENT    . PACEMAKER PLACEMENT       Prior to Admission medications   Medication Sig Start Date End Date Taking? Authorizing Provider  Acetaminophen 500 MG coapsule Take by mouth.    Historical Provider, MD  apixaban (ELIQUIS) 5 MG TABS tablet Take 1 tablet (5 mg total) by mouth 2 (two) times daily. 01/13/15    Layne Benton, NP  atorvastatin (LIPITOR) 20 MG tablet Take 1 tablet (20 mg total) by mouth daily at 6 PM. 01/13/15   Layne Benton, NP  bimatoprost (LUMIGAN) 0.01 % SOLN Place 1 drop into both eyes at bedtime.    Historical Provider, MD  bimatoprost (LUMIGAN) 0.01 % SOLN Apply to eye. 12/05/14   Historical Provider, MD  carvedilol (COREG) 6.25 MG tablet Take 1 tablet (6.25 mg total) by mouth 2 (two) times daily with a meal. 01/13/15   Layne Benton, NP  cholecalciferol (VITAMIN D) 1000 UNITS tablet Take 2,000 Units by mouth daily.    Historical Provider, MD  hydrochlorothiazide (HYDRODIURIL) 25 MG tablet Take 25 mg by mouth daily.    Historical Provider, MD  lisinopril (PRINIVIL,ZESTRIL) 5 MG tablet Take 1 tablet (5 mg total) by mouth daily. 01/13/15   Layne Benton, NP  metFORMIN (GLUCOPHAGE) 500 MG tablet Take 500 mg by mouth 2 (two) times daily with a meal.    Historical Provider, MD  neomycin-bacitracin-polymyxin (NEOSPORIN) OINT Apply 1 application topically daily. Apply Neosporin to left leg wound Q day, then cover with foam dressing.  Change foam dressing Q 5 days or PRN soiling. 01/13/15   Layne Benton, NP  potassium chloride (K-DUR,KLOR-CON) 10 MEQ tablet Take 10 mEq by mouth daily.    Historical Provider, MD     Allergies Review of patient's allergies indicates no known allergies.  Family History  Problem Relation Age of Onset  . Stroke Sister     Social History Social History  Substance Use Topics  . Smoking status: Former Games developermoker  . Smokeless tobacco: Never Used  . Alcohol use No    Review of Systems  Constitutional:   No fever or chills.  ENT:   No sore throat. No rhinorrhea. Cardiovascular:   No chest pain. Respiratory:   No dyspnea or cough. Gastrointestinal:   Negative for abdominal pain, vomiting and diarrhea.   Musculoskeletal:   Negative for focal pain or swelling Neurological:   Positive headache 10-point ROS otherwise  negative.  ____________________________________________   PHYSICAL EXAM:  VITAL SIGNS: ED Triage Vitals  Enc Vitals Group     BP 03/12/16 1604 (!) 158/99     Pulse Rate 03/12/16 1604 72     Resp 03/12/16 1604 18     Temp 03/12/16 1604 98.2 F (36.8 C)     Temp Source 03/12/16 1604 Oral     SpO2 03/12/16 1604 96 %     Weight 03/12/16 1605 130 lb (59 kg)     Height 03/12/16 1605 5\' 2"  (1.575 m)     Head Circumference --      Peak Flow --      Pain Score 03/12/16 1604 5     Pain Loc --      Pain Edu? --      Excl. in GC? --     Vital signs reviewed, nursing assessments reviewed.   Constitutional:   Alert and oriented. Well appearing and in no distress. Eyes:   No scleral icterus. No conjunctival pallor. PERRL. EOMI.  No nystagmus. ENT   Head:   Normocephalic with subcutaneous hematoma over the right orbital ridge and extending into the upper eyelid. The globe is uninjured, not ecchymotic or hemorrhagic, no fluid leakage, intact extraocular movements..   Nose:   No congestion/rhinnorhea. No septal hematoma   Mouth/Throat:   MMM, no pharyngeal erythema. No peritonsillar mass.    Neck:   No stridor. No SubQ emphysema. No meningismus. Spinal tenderness. Full range of motion. Hematological/Lymphatic/Immunilogical:   No cervical lymphadenopathy. Cardiovascular:   RRR. Symmetric bilateral radial and DP pulses.  No murmurs.  Respiratory:   Normal respiratory effort without tachypnea nor retractions. Breath sounds are clear and equal bilaterally. No wheezes/rales/rhonchi. Gastrointestinal:   Soft and nontender. Non distended. There is no CVA tenderness.  No rebound, rigidity, or guarding. Genitourinary:   deferred Musculoskeletal:   Nontender with normal range of motion in all extremities. No joint effusions.  No lower extremity tenderness.  No edema. Neurologic:   Normal speech and language.  CN 2-10 normal. Motor grossly intact. No gross focal neurologic deficits are  appreciated.  Skin:    Skin is warm, dry and intact. No rash noted.  No petechiae, purpura, or bullae.  ____________________________________________    LABS (pertinent positives/negatives) (all labs ordered are listed, but only abnormal results are displayed) Labs Reviewed - No data to display ____________________________________________   EKG    ____________________________________________    RADIOLOGY  CT head unremarkable except for scalp hematoma  ____________________________________________   PROCEDURES Procedures  ____________________________________________   INITIAL IMPRESSION / ASSESSMENT AND PLAN / ED COURSE  Pertinent labs & imaging results that were available during my care of the patient were reviewed by me and considered in my medical decision making (see chart for details).  Patient well appearing no acute distress presents with scalp hematoma after a fall  out of bed which appears to be mechanical. CT negative for any acute bleeding, no other evidence of any traumatic injury. We'll discharge home to follow up with primary care.     Clinical Course   ____________________________________________   FINAL CLINICAL IMPRESSION(S) / ED DIAGNOSES  Final diagnoses:  Facial hematoma, initial encounter  Mild concussion     Portions of this note were generated with dragon dictation software. Dictation errors may occur despite best attempts at proofreading.    Sharman Cheek, MD 03/12/16 [phone]

## 2016-03-12 NOTE — ED Triage Notes (Signed)
Pt was getting out of bed early this morning fell trying to get up. Denies dizziness or weakness. Hit head. Swelling to right eye. Unable to open right eye. Has been drowsy since. On eliquis. No LOC.

## 2016-03-12 NOTE — ED Notes (Signed)
Patient returned from CT placed back on monitor. Will continue to monitor patient

## 2016-03-15 DIAGNOSIS — R4182 Altered mental status, unspecified: Secondary | ICD-10-CM | POA: Diagnosis not present

## 2016-03-21 DIAGNOSIS — H578 Other specified disorders of eye and adnexa: Secondary | ICD-10-CM | POA: Diagnosis not present

## 2016-04-11 DIAGNOSIS — E119 Type 2 diabetes mellitus without complications: Secondary | ICD-10-CM | POA: Diagnosis not present

## 2016-04-18 DIAGNOSIS — I10 Essential (primary) hypertension: Secondary | ICD-10-CM | POA: Diagnosis not present

## 2016-04-18 DIAGNOSIS — I480 Paroxysmal atrial fibrillation: Secondary | ICD-10-CM | POA: Diagnosis not present

## 2016-04-18 DIAGNOSIS — E119 Type 2 diabetes mellitus without complications: Secondary | ICD-10-CM | POA: Diagnosis not present

## 2016-04-25 ENCOUNTER — Emergency Department: Payer: Commercial Managed Care - HMO

## 2016-04-25 ENCOUNTER — Observation Stay
Admission: EM | Admit: 2016-04-25 | Discharge: 2016-04-27 | Disposition: A | Discharge status: Home / Self Care | Payer: Commercial Managed Care - HMO | Attending: Internal Medicine | Admitting: Internal Medicine

## 2016-04-25 DIAGNOSIS — Z79899 Other long term (current) drug therapy: Secondary | ICD-10-CM | POA: Diagnosis not present

## 2016-04-25 DIAGNOSIS — Z9660 Presence of unspecified orthopedic joint implant: Secondary | ICD-10-CM | POA: Diagnosis not present

## 2016-04-25 DIAGNOSIS — I110 Hypertensive heart disease with heart failure: Secondary | ICD-10-CM | POA: Insufficient documentation

## 2016-04-25 DIAGNOSIS — R4182 Altered mental status, unspecified: Secondary | ICD-10-CM | POA: Diagnosis not present

## 2016-04-25 DIAGNOSIS — Z794 Long term (current) use of insulin: Secondary | ICD-10-CM | POA: Insufficient documentation

## 2016-04-25 DIAGNOSIS — Z90710 Acquired absence of both cervix and uterus: Secondary | ICD-10-CM | POA: Diagnosis not present

## 2016-04-25 DIAGNOSIS — E785 Hyperlipidemia, unspecified: Secondary | ICD-10-CM | POA: Diagnosis not present

## 2016-04-25 DIAGNOSIS — I472 Ventricular tachycardia: Secondary | ICD-10-CM | POA: Insufficient documentation

## 2016-04-25 DIAGNOSIS — Z7901 Long term (current) use of anticoagulants: Secondary | ICD-10-CM | POA: Diagnosis not present

## 2016-04-25 DIAGNOSIS — Z823 Family history of stroke: Secondary | ICD-10-CM | POA: Diagnosis not present

## 2016-04-25 DIAGNOSIS — R262 Difficulty in walking, not elsewhere classified: Secondary | ICD-10-CM

## 2016-04-25 DIAGNOSIS — M6281 Muscle weakness (generalized): Secondary | ICD-10-CM

## 2016-04-25 DIAGNOSIS — E876 Hypokalemia: Secondary | ICD-10-CM | POA: Diagnosis not present

## 2016-04-25 DIAGNOSIS — Z950 Presence of cardiac pacemaker: Secondary | ICD-10-CM | POA: Diagnosis not present

## 2016-04-25 DIAGNOSIS — I081 Rheumatic disorders of both mitral and tricuspid valves: Secondary | ICD-10-CM | POA: Diagnosis not present

## 2016-04-25 DIAGNOSIS — Z9181 History of falling: Secondary | ICD-10-CM | POA: Diagnosis not present

## 2016-04-25 DIAGNOSIS — I480 Paroxysmal atrial fibrillation: Secondary | ICD-10-CM | POA: Diagnosis present

## 2016-04-25 DIAGNOSIS — G9340 Encephalopathy, unspecified: Principal | ICD-10-CM | POA: Diagnosis present

## 2016-04-25 DIAGNOSIS — E162 Hypoglycemia, unspecified: Secondary | ICD-10-CM | POA: Diagnosis not present

## 2016-04-25 DIAGNOSIS — I10 Essential (primary) hypertension: Secondary | ICD-10-CM | POA: Diagnosis present

## 2016-04-25 DIAGNOSIS — R410 Disorientation, unspecified: Secondary | ICD-10-CM | POA: Diagnosis not present

## 2016-04-25 DIAGNOSIS — Z87891 Personal history of nicotine dependence: Secondary | ICD-10-CM | POA: Insufficient documentation

## 2016-04-25 DIAGNOSIS — E1142 Type 2 diabetes mellitus with diabetic polyneuropathy: Secondary | ICD-10-CM

## 2016-04-25 DIAGNOSIS — I69359 Hemiplegia and hemiparesis following cerebral infarction affecting unspecified side: Secondary | ICD-10-CM | POA: Insufficient documentation

## 2016-04-25 DIAGNOSIS — I429 Cardiomyopathy, unspecified: Secondary | ICD-10-CM | POA: Diagnosis not present

## 2016-04-25 DIAGNOSIS — E119 Type 2 diabetes mellitus without complications: Secondary | ICD-10-CM | POA: Diagnosis not present

## 2016-04-25 DIAGNOSIS — J45909 Unspecified asthma, uncomplicated: Secondary | ICD-10-CM | POA: Diagnosis not present

## 2016-04-25 DIAGNOSIS — I4891 Unspecified atrial fibrillation: Secondary | ICD-10-CM | POA: Insufficient documentation

## 2016-04-25 DIAGNOSIS — R531 Weakness: Secondary | ICD-10-CM | POA: Diagnosis not present

## 2016-04-25 DIAGNOSIS — I639 Cerebral infarction, unspecified: Secondary | ICD-10-CM | POA: Diagnosis present

## 2016-04-25 DIAGNOSIS — R2681 Unsteadiness on feet: Secondary | ICD-10-CM

## 2016-04-25 DIAGNOSIS — Z8673 Personal history of transient ischemic attack (TIA), and cerebral infarction without residual deficits: Secondary | ICD-10-CM | POA: Diagnosis not present

## 2016-04-25 DIAGNOSIS — I509 Heart failure, unspecified: Secondary | ICD-10-CM | POA: Diagnosis not present

## 2016-04-25 LAB — URINALYSIS COMPLETE WITH MICROSCOPIC (ARMC ONLY)
BACTERIA UA: NONE SEEN
Bilirubin Urine: NEGATIVE
GLUCOSE, UA: NEGATIVE mg/dL
Hgb urine dipstick: NEGATIVE
Ketones, ur: NEGATIVE mg/dL
Leukocytes, UA: NEGATIVE
NITRITE: NEGATIVE
PROTEIN: NEGATIVE mg/dL
RBC / HPF: NONE SEEN RBC/hpf (ref 0–5)
SPECIFIC GRAVITY, URINE: 1.011 (ref 1.005–1.030)
SQUAMOUS EPITHELIAL / LPF: NONE SEEN
pH: 7 (ref 5.0–8.0)

## 2016-04-25 LAB — CBC
HCT: 42.4 % (ref 35.0–47.0)
Hemoglobin: 13.7 g/dL (ref 12.0–16.0)
MCH: 27.9 pg (ref 26.0–34.0)
MCHC: 32.4 g/dL (ref 32.0–36.0)
MCV: 85.9 fL (ref 80.0–100.0)
PLATELETS: 189 10*3/uL (ref 150–440)
RBC: 4.93 MIL/uL (ref 3.80–5.20)
RDW: 14.9 % — AB (ref 11.5–14.5)
WBC: 2.7 10*3/uL — ABNORMAL LOW (ref 3.6–11.0)

## 2016-04-25 LAB — COMPREHENSIVE METABOLIC PANEL
ALK PHOS: 80 U/L (ref 38–126)
ALT: 24 U/L (ref 14–54)
AST: 29 U/L (ref 15–41)
Albumin: 4 g/dL (ref 3.5–5.0)
Anion gap: 10 (ref 5–15)
BUN: 25 mg/dL — AB (ref 6–20)
CALCIUM: 9.7 mg/dL (ref 8.9–10.3)
CHLORIDE: 105 mmol/L (ref 101–111)
CO2: 24 mmol/L (ref 22–32)
CREATININE: 0.83 mg/dL (ref 0.44–1.00)
GFR calc non Af Amer: >60 mL/min (ref >60)
GLUCOSE: 87 mg/dL (ref 65–99)
Potassium: 3.8 mmol/L (ref 3.5–5.1)
SODIUM: 139 mmol/L (ref 135–145)
Total Bilirubin: 0.5 mg/dL (ref 0.3–1.2)
Total Protein: 7.4 g/dL (ref 6.5–8.1)

## 2016-04-25 LAB — GLUCOSE, CAPILLARY: Glucose-Capillary: 84 mg/dL (ref 65–99)

## 2016-04-25 LAB — LACTIC ACID, PLASMA: LACTIC ACID, VENOUS: 1.3 mmol/L (ref 0.5–1.9)

## 2016-04-25 NOTE — ED Notes (Signed)
Patient's daughter reports that patient was recently treated for UTI

## 2016-04-25 NOTE — ED Triage Notes (Signed)
Per EMS: EMS called out for unresponsiveness. Patient alert and able to communicate needs to EMS, however patient seemed confused. Pt has extensive medical hx most notably stroke affecting left side, diabetes, and implanted pacemaker.

## 2016-04-25 NOTE — ED Notes (Signed)
Patients daughter reports they left patient at home at aprox 1620 today. When they arrived home patient was unresponsive. Family called EMS and while waiting patient slowly became more responsive, however was not her normal self. Pt's daughter reports hx of stroke affecting left side and memory impairments.

## 2016-04-25 NOTE — ED Provider Notes (Signed)
[facility] Emergency Department Provider Note    ____________________________________________   I have reviewed the triage vital signs and the nursing notes.   HISTORY  Chief Complaint Altered Mental Status   History limited by: Not Limited   HPI Tamara Campbell is a 82 y.o. female who presents to the emergency department today because of concern for altered mental status and confusion. Family states that they last saw her normal around 4:00 this afternoon. She was sitting in her recliner. Apparently when they went to check on her little bit later they noticed that she was having a hard time waking up. She then seemed to be confused to the family. Since that time she has been getting better and family thinks she has improved at the time of my examination. The patient denies any headache, chest pain shortness of breath. No recent fevers. Does have a history of a stroke that his left wrist some residual left upper extremity weakness.   Past Medical History:  Diagnosis Date  . CHF (congestive heart failure) (HCC)   . Diabetes mellitus without complication (HCC)   . Hypertension   . Pacemaker   . Stroke [facility])     Patient Active Problem List   Diagnosis Date Noted  . Embolic stroke (HCC) 03/16/2015  . Risk for falls 03/16/2015  . Essential hypertension 01/13/2015  . Hyperlipidemia LDL goal <70 01/13/2015  . Diabetes type 2, controlled (HCC) 01/13/2015  . Paroxysmal ventricular tachycardia (HCC) 01/13/2015  . Hypokalemia 01/13/2015  . Open wound of left lower leg 01/13/2015  . Cerebral infarction due to embolism of middle cerebral artery (HCC)   . Atrial fibrillation (HCC) 01/10/2015  . Cardiomyopathy (HCC) 01/10/2015  . CVA (cerebral infarction) 01/08/2015    Past Surgical History:  Procedure Laterality Date  . ABDOMINAL HYSTERECTOMY    . JOINT REPLACEMENT    . PACEMAKER PLACEMENT      Prior to Admission medications   Medication Sig Start Date  End Date Taking? Authorizing Provider  Acetaminophen 500 MG coapsule Take by mouth.    Historical Provider, MD  apixaban (ELIQUIS) 5 MG TABS tablet Take 1 tablet (5 mg total) by mouth 2 (two) times daily. 01/13/15   Layne Benton, NP  atorvastatin (LIPITOR) 20 MG tablet Take 1 tablet (20 mg total) by mouth daily at 6 PM. 01/13/15   Layne Benton, NP  bimatoprost (LUMIGAN) 0.01 % SOLN Place 1 drop into both eyes at bedtime.    Historical Provider, MD  bimatoprost (LUMIGAN) 0.01 % SOLN Apply to eye. 12/05/14   Historical Provider, MD  carvedilol (COREG) 6.25 MG tablet Take 1 tablet (6.25 mg total) by mouth 2 (two) times daily with a meal. 01/13/15   Layne Benton, NP  cholecalciferol (VITAMIN D) 1000 UNITS tablet Take 2,000 Units by mouth daily.    Historical Provider, MD  hydrochlorothiazide (HYDRODIURIL) 25 MG tablet Take 25 mg by mouth daily.    Historical Provider, MD  lisinopril (PRINIVIL,ZESTRIL) 5 MG tablet Take 1 tablet (5 mg total) by mouth daily. 01/13/15   Layne Benton, NP  metFORMIN (GLUCOPHAGE) 500 MG tablet Take 500 mg by mouth 2 (two) times daily with a meal.    Historical Provider, MD  neomycin-bacitracin-polymyxin (NEOSPORIN) OINT Apply 1 application topically daily. Apply Neosporin to left leg wound Q day, then cover with foam dressing.  Change foam dressing Q 5 days or PRN soiling. 01/13/15   Layne Benton, NP  potassium chloride (K-DUR,KLOR-CON) 10  MEQ tablet Take 10 mEq by mouth daily.    Historical Provider, MD    Allergies Patient has no known allergies.  Family History  Problem Relation Age of Onset  . Stroke Sister     Social History Social History  Substance Use Topics  . Smoking status: Former Games developermoker  . Smokeless tobacco: Never Used  . Alcohol use No    Review of Systems  Constitutional: Negative for fever. Cardiovascular: Negative for chest pain. Respiratory: Negative for shortness of breath. Gastrointestinal: Negative for abdominal pain, vomiting and  diarrhea. Genitourinary: Negative for dysuria. Musculoskeletal: Negative for back pain. Neurological: Positive for confusion.  10-point ROS otherwise negative.  ____________________________________________   PHYSICAL EXAM:  VITAL SIGNS: ED Triage Vitals  Enc Vitals Group     BP --      Pulse --      Resp --      Temp --      Temp src --      SpO2 04/25/16 2021 97 %     Weight 04/25/16 2023 128 lb (58.1 kg)     Height 04/25/16 2023 5' 2.5" (1.588 m)     Head Circumference --    Constitutional: Alert and oriented. Well appearing and in no distress. Eyes: Conjunctivae are normal. Normal extraocular movements. ENT   Head: Normocephalic and atraumatic.   Nose: No congestion/rhinnorhea.   Mouth/Throat: Mucous membranes are moist.   Neck: No stridor. Hematological/Lymphatic/Immunilogical: No cervical lymphadenopathy. Cardiovascular: Normal rate, regular rhythm.  No murmurs, rubs, or gallops.  Respiratory: Normal respiratory effort without tachypnea nor retractions. Breath sounds are clear and equal bilaterally. No wheezes/rales/rhonchi. Gastrointestinal: Soft and nontender. No distention.  Genitourinary: Deferred Musculoskeletal: Normal range of motion in all extremities. No lower extremity edema. Neurologic:  Normal speech and language. No gross focal neurologic deficits are appreciated.  Skin:  Skin is warm, dry and intact. No rash noted. Psychiatric: Mood and affect are normal. Speech and behavior are normal. Patient exhibits appropriate insight and judgment.  ____________________________________________    LABS (pertinent positives/negatives)  Labs Reviewed  COMPREHENSIVE METABOLIC PANEL - Abnormal; Notable for the following:       Result Value   BUN 25 (*)    All other components within normal limits  CBC - Abnormal; Notable for the following:    WBC 2.7 (*)    RDW 14.9 (*)    All other components within normal limits  URINALYSIS COMPLETEWITH  MICROSCOPIC ([facility] ONLY) - Abnormal; Notable for the following:    Color, Urine STRAW (*)    APPearance CLEAR (*)    All other components within normal limits  CULTURE, BLOOD (ROUTINE X 2)  CULTURE, BLOOD (ROUTINE X 2)  LACTIC ACID, PLASMA  GLUCOSE, CAPILLARY  CBG MONITORING, ED     ____________________________________________   EKG  I, Phineas SemenGraydon Neeya Prigmore, attending physician, personally viewed and interpreted this EKG  EKG Time: 2024 Rate: 73 Rhythm: ventricular paced rhythm Axis: left axis deviation Intervals: qtc 537 QRS: wide ST changes: no st elevation equivalent Impression: abnormal ekg   ____________________________________________    RADIOLOGY  CT head IMPRESSION:  1. No acute intracranial abnormality identified.  2. Advanced chronic microvascular ischemic changes of the brain,  chronic infarcts, and moderate brain parenchymal volume loss.    ____________________________________________   PROCEDURES  Procedures  ____________________________________________   INITIAL IMPRESSION / ASSESSMENT AND PLAN / ED COURSE  Pertinent labs & imaging results that were available during my care of the patient were reviewed by me  and considered in my medical decision making (see chart for details).  Patient presented to the emergency department today because of concerns for an episode of decreased responsiveness and confusion. By the time of my exam the patient was doing better. She was awake and alert. She was oriented. Will check blood work head CT to investigate for any obvious intercranial pathology or infection. Will additionally check urinalysis.   ----------------------------------------- 11:39 PM on 04/25/2016 -----------------------------------------  Workup essentially under unremarkable. Slightly low white blood cell count. No obvious etiology of the patient's symptoms. Given history of CVI do have concerns for TIA. We will plan admission to hospital  service for further workup and evaluation. ____________________________________________   FINAL CLINICAL IMPRESSION(S) / ED DIAGNOSES  Final diagnoses:  Altered mental status, unspecified altered mental status type  Confusion     Note: This dictation was prepared with Dragon dictation. Any transcriptional errors that result from this process are unintentional    Phineas SemenGraydon Kailo Kosik, MD 04/25/16 [phone]

## 2016-04-26 ENCOUNTER — Observation Stay
Admit: 2016-04-26 | Discharge: 2016-04-26 | Disposition: A | Discharge status: Home / Self Care | Payer: Commercial Managed Care - HMO | Attending: Internal Medicine | Admitting: Internal Medicine

## 2016-04-26 ENCOUNTER — Observation Stay: Payer: Commercial Managed Care - HMO

## 2016-04-26 DIAGNOSIS — E785 Hyperlipidemia, unspecified: Secondary | ICD-10-CM | POA: Diagnosis not present

## 2016-04-26 DIAGNOSIS — I639 Cerebral infarction, unspecified: Secondary | ICD-10-CM | POA: Diagnosis not present

## 2016-04-26 DIAGNOSIS — E119 Type 2 diabetes mellitus without complications: Secondary | ICD-10-CM | POA: Diagnosis not present

## 2016-04-26 DIAGNOSIS — I10 Essential (primary) hypertension: Secondary | ICD-10-CM | POA: Diagnosis not present

## 2016-04-26 DIAGNOSIS — G9340 Encephalopathy, unspecified: Secondary | ICD-10-CM | POA: Diagnosis present

## 2016-04-26 DIAGNOSIS — I4891 Unspecified atrial fibrillation: Secondary | ICD-10-CM | POA: Diagnosis not present

## 2016-04-26 DIAGNOSIS — I6523 Occlusion and stenosis of bilateral carotid arteries: Secondary | ICD-10-CM | POA: Diagnosis not present

## 2016-04-26 LAB — LIPID PANEL
CHOL/HDL RATIO: 2.5 ratio
Cholesterol: 150 mg/dL (ref 0–200)
HDL: 59 mg/dL (ref >40)
LDL CALC: 81 mg/dL (ref 0–99)
Triglycerides: 51 mg/dL (ref <150)
VLDL: 10 mg/dL (ref 0–40)

## 2016-04-26 LAB — GLUCOSE, CAPILLARY
GLUCOSE-CAPILLARY: 95 mg/dL (ref 65–99)
GLUCOSE-CAPILLARY: 78 mg/dL (ref 65–99)
Glucose-Capillary: 87 mg/dL (ref 65–99)
Glucose-Capillary: 122 mg/dL — ABNORMAL HIGH (ref 65–99)

## 2016-04-26 MED ORDER — ATORVASTATIN CALCIUM 20 MG PO TABS
40.0000 mg | ORAL_TABLET | Freq: Every day | ORAL | Status: DC
  Administered 2016-04-26: 40 mg via ORAL
  Filled 2016-04-26: qty 2

## 2016-04-26 MED ORDER — INSULIN ASPART 100 UNIT/ML ~~LOC~~ SOLN: 0.0000 [IU] | Freq: Every day | SUBCUTANEOUS | Status: DC

## 2016-04-26 MED ORDER — APIXABAN 2.5 MG PO TABS
2.5000 mg | ORAL_TABLET | Freq: Two times a day (BID) | ORAL | Status: DC
  Administered 2016-04-26 – 2016-04-27 (×3): 2.5 mg via ORAL
  Filled 2016-04-26 (×3): qty 1

## 2016-04-26 MED ORDER — LATANOPROST 0.005 % OP SOLN
1.0000 [drp] | Freq: Every day | OPHTHALMIC | Status: DC
  Administered 2016-04-26: 22:00:00 1 [drp] via OPHTHALMIC
  Filled 2016-04-26: qty 2.5

## 2016-04-26 MED ORDER — ATORVASTATIN CALCIUM 20 MG PO TABS: 20.0000 mg | ORAL_TABLET | Freq: Every day | ORAL | Status: DC

## 2016-04-26 MED ORDER — STROKE: EARLY STAGES OF RECOVERY BOOK
Freq: Once | Status: AC
  Administered 2016-04-26: 04:00:00

## 2016-04-26 MED ORDER — INSULIN ASPART 100 UNIT/ML ~~LOC~~ SOLN
0.0000 [IU] | Freq: Three times a day (TID) | SUBCUTANEOUS | Status: DC
  Administered 2016-04-27: 1 [IU] via SUBCUTANEOUS
  Filled 2016-04-26: qty 1

## 2016-04-26 NOTE — Care Management (Signed)
Admitted to [facility] under observation status with the diagnosis of acute encephalopathy. Son [name] lives in the home. [phone]). Last seen Dr. Yates DecampJohn Sconyers in January 2017.  Health Care 01/13/15 x 30 days. Home health in the home in the past. Doesn't remember name of agency. No home oxygen. Self feeds. Sister helps with baths and dressing. [place] and rolling Bring in the home. Larey SeatFell about 2 months ago. Good appetite. [facility] [address]. Family will transport. Gwenette GreetBrenda S Rilen Shukla RN MSN CCM Care Management

## 2016-04-26 NOTE — Progress Notes (Signed)
Patient passed bedside stroke swallow screen. Patient currently NPO, Day RN aware that patient passed and is in need of diet order.

## 2016-04-26 NOTE — Progress Notes (Addendum)
Patient admitted to room 109 with stroke orders. Pt will open eyes briefly, alert but uncooperative with any assessments or interactions. Will continue to attempt to assess.

## 2016-04-26 NOTE — H&P (Signed)
[facility] [place] [facility]   PATIENT NAME: Tamara Campbell    MR#:  [id number]  DATE OF BIRTH:  02/11/1931  DATE OF ADMISSION:  04/25/2016  PRIMARY CARE PHYSICIAN: Rafael BihariWALKER III, JOHN B, MD   REQUESTING/REFERRING PHYSICIAN: Derrill KayGoodman, MD  CHIEF COMPLAINT:   Chief Complaint  Patient presents with  . Altered Mental Status    HISTORY OF PRESENT ILLNESS:  Tamara Eatonnnie Feehan  is a 82 y.o. female who presents with An episode of decreased responsiveness and confusion at home. She was found by her family sitting in her chair and was difficult to wake up, and was somewhat confused. Here in the ED workup was largely negative at first, but she does have a significant history of embolic strokes. She has some persistent residual weakness from her prior strokes. Hospitals were called for admission for further evaluation for possible stroke. Of note, patient had told her daughter that she did not want to stay. Initially on evaluation for this writer she was oriented, and was able to relate back the reasons we wanted her to stay. However, she refused to state out right whether or not she wanted to stay or go home. After quite a bit of conversation about the matter she refused to speak any further, and became uncooperative, refusing to speak at all or open her eyes.  PAST MEDICAL HISTORY:   Past Medical History:  Diagnosis Date  . CHF (congestive heart failure) (HCC)   . Diabetes mellitus without complication (HCC)   . Hypertension   . Pacemaker   . Stroke [facility](HCC)     PAST SURGICAL HISTORY:   Past Surgical History:  Procedure Laterality Date  . ABDOMINAL HYSTERECTOMY    . JOINT REPLACEMENT    . PACEMAKER PLACEMENT      SOCIAL HISTORY:   Social History  Substance Use Topics  . Smoking status: Former Games developermoker  . Smokeless tobacco: Never Used  . Alcohol use No    FAMILY HISTORY:   Family History  Problem Relation Age of Onset  . Stroke Sister     DRUG ALLERGIES:   No Known Allergies  MEDICATIONS AT HOME:   Prior to Admission medications   Medication Sig Start Date End Date Taking? Authorizing Provider  acetaminophen (TYLENOL) 500 MG tablet Take 500 mg by mouth every 4 (four) hours as needed for mild pain or fever.   Yes Historical Provider, MD  amLODipine-benazepril (LOTREL) 10-20 MG capsule Take 1 capsule by mouth daily.   Yes Historical Provider, MD  apixaban (ELIQUIS) 5 MG TABS tablet Take 1 tablet (5 mg total) by mouth 2 (two) times daily. 01/13/15  Yes Layne BentonSharon L Biby, NP  atorvastatin (LIPITOR) 20 MG tablet Take 1 tablet (20 mg total) by mouth daily at 6 PM. 01/13/15  Yes Layne BentonSharon L Biby, NP  bimatoprost (LUMIGAN) 0.01 % SOLN Place 1 drop into both eyes at bedtime.   Yes Historical Provider, MD  carvedilol (COREG) 6.25 MG tablet Take 1 tablet (6.25 mg total) by mouth 2 (two) times daily with a meal. 01/13/15  Yes Layne BentonSharon L Biby, NP  cholecalciferol (VITAMIN D) 1000 UNITS tablet Take 2,000 Units by mouth daily.   Yes Historical Provider, MD  hydrochlorothiazide (HYDRODIURIL) 25 MG tablet Take 25 mg by mouth daily.   Yes Historical Provider, MD  lisinopril (PRINIVIL,ZESTRIL) 5 MG tablet Take 1 tablet (5 mg total) by mouth daily. 01/13/15  Yes Layne BentonSharon L Biby, NP  metFORMIN (GLUCOPHAGE) 500 MG tablet Take 500  mg by mouth 2 (two) times daily with a meal.   Yes Historical Provider, MD  Multiple Vitamin (MULTIVITAMIN WITH MINERALS) TABS tablet Take 1 tablet by mouth daily.   Yes Historical Provider, MD  potassium chloride (K-DUR,KLOR-CON) 10 MEQ tablet Take 10 mEq by mouth daily.   Yes Historical Provider, MD  neomycin-bacitracin-polymyxin (NEOSPORIN) OINT Apply 1 application topically daily. Apply Neosporin to left leg wound Q day, then cover with foam dressing.  Change foam dressing Q 5 days or PRN soiling. Patient not taking: Reported on 04/25/2016 01/13/15   Layne Benton, NP    REVIEW OF SYSTEMS:  Review of Systems  Unable to perform ROS: Other    Through the  course interview the patient became less cooperative and were not clearly answer questions for review of systems. VITAL SIGNS:   Vitals:   04/25/16 2021 04/25/16 2023 04/25/16 2030  BP:   (!) 164/98  Pulse:   69  Resp:   (!) 25  SpO2: 97%  99%  Weight:  58.1 kg (128 lb)   Height:  5' 2.5" (1.588 m)    Wt Readings from Last 3 Encounters:  04/25/16 58.1 kg (128 lb)  03/12/16 59 kg (130 lb)  09/14/15 58.2 kg (128 lb 6.4 oz)    PHYSICAL EXAMINATION:  Physical Exam  Vitals reviewed. Constitutional: She is oriented to person, place, and time. She appears well-developed and well-nourished. No distress.  HENT:  Head: Normocephalic and atraumatic.  Mouth/Throat: Oropharynx is clear and moist.  Eyes: Conjunctivae and EOM are normal. Pupils are equal, round, and reactive to light. No scleral icterus.  Neck: Normal range of motion. Neck supple. No JVD present. No thyromegaly present.  Cardiovascular: Normal rate, regular rhythm and intact distal pulses.  Exam reveals no gallop and no friction rub.   No murmur heard. Respiratory: Effort normal and breath sounds normal. No respiratory distress. She has no wheezes. She has no rales.  GI: Soft. Bowel sounds are normal. She exhibits no distension. There is no tenderness.  Musculoskeletal: Normal range of motion. She exhibits no edema.  No arthritis, no gout  Lymphadenopathy:    She has no cervical adenopathy.  Neurological: She is alert and oriented to person, place, and time. No cranial nerve deficit.  Patient largely uncooperative, though she does have some persistent right upper extremity weakness known from her prior stroke. The rest of the neurological exam is largely unremarkable, though also somewhat unreliable given the patient's lack of cooperation.  Skin: Skin is warm and dry. No rash noted. No erythema.  Psychiatric: She has a normal mood and affect. Her behavior is normal. Judgment and thought content normal.    LABORATORY PANEL:    CBC  Recent Labs Lab 04/25/16 2020  WBC 2.7*  HGB 13.7  HCT 42.4  PLT 189   ------------------------------------------------------------------------------------------------------------------  Chemistries   Recent Labs Lab 04/25/16 2020  NA 139  K 3.8  CL 105  CO2 24  GLUCOSE 87  BUN 25*  CREATININE 0.83  CALCIUM 9.7  AST 29  ALT 24  ALKPHOS 80  BILITOT 0.5   ------------------------------------------------------------------------------------------------------------------  Cardiac Enzymes No results for input(s): TROPONINI in the last 168 hours. ------------------------------------------------------------------------------------------------------------------  RADIOLOGY:  Ct Head Wo Contrast  Result Date: 04/25/2016 CLINICAL DATA:  82 y/o  F; altered mental status. EXAM: CT HEAD WITHOUT CONTRAST TECHNIQUE: Contiguous axial images were obtained from the base of the skull through the vertex without intravenous contrast. COMPARISON:  03/12/2016 CT head. FINDINGS: Brain:  No evidence of acute infarction, hemorrhage, hydrocephalus, extra-axial collection or mass lesion/mass effect. Confluent hypoattenuation of subcortical and periventricular white matter is compatible with advanced chronic microvascular ischemic changes. Chronic infarcts are present in the right posterior frontal lobe, bilateral thalamus and the right inferior cerebellum. Moderate brain parenchymal volume loss. Vascular: No hyperdense vessel. Extensive calcific atherosclerosis of the cavernous internal carotid arteries. Skull: Normal. Negative for fracture or focal lesion. Sinuses/Orbits: No acute finding. Other: None. IMPRESSION: 1. No acute intracranial abnormality identified. 2. Advanced chronic microvascular ischemic changes of the brain, chronic infarcts, and moderate brain parenchymal volume loss. Electronically Signed   By: Mitzi HansenLance  Furusawa-Stratton M.D.   On: 04/25/2016 21:34    EKG:   Orders placed or  performed during the hospital encounter of 04/25/16  . ED EKG  . ED EKG    IMPRESSION AND PLAN:  Principal Problem:   Acute encephalopathy - unclear if patient's failure to respond is due to encephalopathy or confusion or not. In the end she did not state that she wanted to go home, and so she was admitted for further workup including MRI to evaluate whether or not she had a recurrent stroke. Neurology consult as well. Active Problems:   Cerebral infarction (HCC) - significant history of prior infarctions with some residual weakness as described above. Evaluation for possible recurrent stroke as above. Continue home meds   Atrial fibrillation (HCC) - continue home meds including anticoagulation   Essential hypertension - hold antihypertensives at this time until we can evaluate whether the patient had a stroke   Diabetes type 2, controlled (HCC) - sliding scale insulin corresponding glucose checks   Hyperlipidemia LDL goal <70 - continue home meds  All the records are reviewed and case discussed with ED provider. Management plans discussed with the patient and/or family.  DVT PROPHYLAXIS: Systemic anticoagulation  GI PROPHYLAXIS: None  ADMISSION STATUS: Observation  CODE STATUS: full Code Status History    Date Active Date Inactive Code Status Order ID Comments User Context   01/09/2015 12:24 AM 01/13/2015  7:20 PM Full Code [id number]  Thana FarrLeslie Reynolds, MD Inpatient      TOTAL TIME TAKING CARE OF THIS PATIENT: 40 minutes.    Saydie Gerdts FIELDING 04/26/2016, 12:15 AM  [facility]  Office  [phone]  CC: Primary care physician; Rafael BihariWALKER III, JOHN B, MD

## 2016-04-26 NOTE — Evaluation (Signed)
Physical Therapy Evaluation Patient Details Name: Tamara Campbell MRN: [id number] DOB: 11/11/1930 Today's Date: 04/26/2016   History of Present Illness  Pt admitted for acute encephalopathy. Pt with complaints of AMS. Pt with pending neuro consult. Very poor historian, unable to elaborate on PLOF. Pt with history of prior CVA (with residual R UE weakness), CHF, DM, and HTN. Upon admission, family found pt sitting in chair very confused.  Clinical Impression  Pt is a pleasant 82 year old female who was admitted for acute encephalopathy. Pt performs bed mobility with mod assist, transfers with mod assist, and ambulation with min assist and RW. Pt confused and poor historian. No strength deficits noted R vs L side. Coordination appears intact, however pt has difficulty following instructions for testing. Pt complains of pain during standing on L shoulder, denies injury or fall. Pt demonstrates deficits with strength/cognition/mobility/pain. Appears to not be at baseline level at this time; needs hands on assist for all mobility. Would benefit from skilled PT to address above deficits and promote optimal return to PLOF; recommend transition to STR upon discharge from acute hospitalization.       Follow Up Recommendations SNF    Equipment Recommendations       Recommendations for Other Services       Precautions / Restrictions Precautions Precautions: Fall Restrictions Weight Bearing Restrictions: No      Mobility  Bed Mobility Overal bed mobility: Needs Assistance Bed Mobility: Supine to Sit     Supine to sit: Mod assist     General bed mobility comments: assist for sequencing and initiating movement. Needs physical assist to move B LEs off EOB. Once seated at EOB, pt able to sit with cga  Transfers Overall transfer level: Needs assistance Equipment used: Rolling Zeiser (2 wheeled) Transfers: Sit to/from Stand Sit to Stand: Mod assist         General transfer comment: Pt  needs assist for anterior translation during standing. Heavy post leaning noted once standing, however able to progress to only requiring min assist for standing.  Ambulation/Gait Ambulation/Gait assistance: Min assist Ambulation Distance (Feet): 3 Feet Assistive device: Rolling Zanella (2 wheeled) Gait Pattern/deviations: Step-to pattern     General Gait Details: ambulated to recliner with min assist. Heavy post leaning noted with heavy cues for sequencing. Needs reinforcement to use RW correctly. Complains of L shoulder pain. SOB symptoms noted once seated in recliner, O2 sats WNL. Unable to further ambulate this date  Stairs            Wheelchair Mobility    Modified Rankin (Stroke Patients Only)       Balance Overall balance assessment: Needs assistance Sitting-balance support: Feet supported Sitting balance-Leahy Scale: Fair     Standing balance support: Bilateral upper extremity supported Standing balance-Leahy Scale: Poor                               Pertinent Vitals/Pain Pain Assessment: Faces Faces Pain Scale: Hurts little more Pain Location: L shoulder Pain Descriptors / Indicators: Aching Pain Intervention(s): Limited activity within patient's tolerance;Repositioned    Home Living Family/patient expects to be discharged to:: Private residence Living Arrangements: Children (son) Available Help at Discharge: Family (unsure of availablility) Type of Home: House Home Access: Stairs to enter   [employer] CorporationEntrance Stairs-Number of Steps: Pt unsure of number of stairs   Home Equipment: [place] - 2 wheels      Prior Function  Comments: unsure of accurate PLOF. Per patient, son does not help with ADLs and she uses her RW for all mobility.     Hand Dominance        Extremity/Trunk Assessment   Upper Extremity Assessment: Generalized weakness (B UE grossly 4/5)           Lower Extremity Assessment: Generalized weakness (B LE  grossly 3+/5)         Communication   Communication: No difficulties  Cognition Arousal/Alertness: Awake/alert Behavior During Therapy: WFL for tasks assessed/performed Overall Cognitive Status: Difficult to assess                      General Comments      Exercises Other Exercises Other Exercises: Seated ther-ex performed including B LE LAQ, hip abd/add, SLRs, B UE shoulder flexion to 90 degrees (only performed 5 reps secondary to pain), and B UE elbow flexion/extension. All ther-ex performed x 10 reps with min/mod assist for B LE for completion.   Assessment/Plan    PT Assessment Patient needs continued PT services  PT Problem List Decreased strength;Decreased activity tolerance;Decreased balance;Decreased mobility;Pain;Decreased safety awareness;Decreased cognition          PT Treatment Interventions Gait training;DME instruction;Therapeutic exercise    PT Goals (Current goals can be found in the Care Plan section)  Acute Rehab PT Goals Patient Stated Goal: unable to state PT Goal Formulation: Patient unable to participate in goal setting Time For Goal Achievement: 05/10/16 Potential to Achieve Goals: Good    Frequency Min 2X/week   Barriers to discharge        Co-evaluation               End of Session Equipment Utilized During Treatment: Gait belt Activity Tolerance: Treatment limited secondary to medical complications (Comment) Patient left: in chair;with chair alarm set Nurse Communication: Mobility status    Functional Assessment Tool Used: clinical judgement Functional Limitation: Mobility: Walking and moving around Mobility: Walking and Moving Around Current Status [phone]([id number]): At least 40 percent but less than 60 percent impaired, limited or restricted Mobility: Walking and Moving Around Goal Status [phone](G8979): At least 20 percent but less than 40 percent impaired, limited or restricted    Time: 0841-0904 PT Time Calculation (min) (ACUTE  ONLY): 23 min   Charges:   PT Evaluation $PT Eval Moderate Complexity: 1 Procedure PT Treatments $Therapeutic Exercise: 8-22 mins   PT G Codes:   PT G-Codes **NOT FOR INPATIENT CLASS** Functional Assessment Tool Used: clinical judgement Functional Limitation: Mobility: Walking and moving around Mobility: Walking and Moving Around Current Status ([id number]): At least 40 percent but less than 60 percent impaired, limited or restricted Mobility: Walking and Moving Around Goal Status [phone]([id number]): At least 20 percent but less than 40 percent impaired, limited or restricted    Lamyah Creed 04/26/2016, 10:42 AM  Elizabeth PalauStephanie Bette Brienza, PT, DPT [phone]

## 2016-04-26 NOTE — Consult Note (Signed)
Reason for Consult:Episode of unresponsiveness Referring Physician: Sudini  CC: Episode of unresponsiveness  HPI: Tamara Campbell is an 82 y.o. female who is unable to provide any history.  All history obtained from the chart.  The patinet was found by her family sitting in her chair and was difficult to wake up, and was somewhat confused. In the ED workup was largely negative at first, but she does have a significant history of embolic strokes. She has some persistent residual weakness from her prior strokes. Was somewhat uncooperative in ED.   Past Medical History:  Diagnosis Date  . CHF (congestive heart failure) (HCC)   . Diabetes mellitus without complication (HCC)   . Hypertension   . Pacemaker   . Stroke [facility])     Past Surgical History:  Procedure Laterality Date  . ABDOMINAL HYSTERECTOMY    . JOINT REPLACEMENT    . PACEMAKER PLACEMENT      Family History  Problem Relation Age of Onset  . Stroke Sister     Social History:  reports that she has quit smoking. She has never used smokeless tobacco. She reports that she does not drink alcohol or use drugs.  No Known Allergies  Medications:  I have reviewed the patient's current medications. Prior to Admission:  Prescriptions Prior to Admission  Medication Sig Dispense Refill Last Dose  . acetaminophen (TYLENOL) 500 MG tablet Take 500 mg by mouth every 4 (four) hours as needed for mild pain or fever.     Marland Kitchen amLODipine-benazepril (LOTREL) 10-20 MG capsule Take 1 capsule by mouth daily.   04/25/2016 at Unknown time  . apixaban (ELIQUIS) 5 MG TABS tablet Take 1 tablet (5 mg total) by mouth 2 (two) times daily. 60 tablet 2 04/25/2016 at Unknown time  . atorvastatin (LIPITOR) 20 MG tablet Take 1 tablet (20 mg total) by mouth daily at 6 PM. 30 tablet 2 04/25/2016 at Unknown time  . bimatoprost (LUMIGAN) 0.01 % SOLN Place 1 drop into both eyes at bedtime.   04/24/2016 at Unknown time  . carvedilol (COREG) 6.25 MG tablet Take 1  tablet (6.25 mg total) by mouth 2 (two) times daily with a meal. 60 tablet 2 04/25/2016 at Unknown time  . cholecalciferol (VITAMIN D) 1000 UNITS tablet Take 2,000 Units by mouth daily.   04/25/2016 at Unknown time  . hydrochlorothiazide (HYDRODIURIL) 25 MG tablet Take 25 mg by mouth daily.   04/25/2016 at Unknown time  . lisinopril (PRINIVIL,ZESTRIL) 5 MG tablet Take 1 tablet (5 mg total) by mouth daily. 30 tablet 2 04/25/2016 at Unknown time  . metFORMIN (GLUCOPHAGE) 500 MG tablet Take 500 mg by mouth 2 (two) times daily with a meal.   04/25/2016 at Unknown time  . Multiple Vitamin (MULTIVITAMIN WITH MINERALS) TABS tablet Take 1 tablet by mouth daily.   04/25/2016 at Unknown time  . potassium chloride (K-DUR,KLOR-CON) 10 MEQ tablet Take 10 mEq by mouth daily.   04/25/2016 at Unknown time  . neomycin-bacitracin-polymyxin (NEOSPORIN) OINT Apply 1 application topically daily. Apply Neosporin to left leg wound Q day, then cover with foam dressing.  Change foam dressing Q 5 days or PRN soiling. (Patient not taking: Reported on 04/25/2016) 1 Tube prn Not Taking at Unknown time   Scheduled: . apixaban  2.5 mg Oral BID  . atorvastatin  40 mg Oral q1800  . insulin aspart  0-5 Units Subcutaneous QHS  . insulin aspart  0-9 Units Subcutaneous TID WC  . latanoprost  1 drop Both  Eyes QHS    ROS: History obtained from the patient  General ROS: negative for - chills, fatigue, fever, night sweats, weight gain or weight loss Psychological ROS: negative for - behavioral disorder, hallucinations, memory difficulties, mood swings or suicidal ideation Ophthalmic ROS: negative for - blurry vision, double vision, eye pain or loss of vision ENT ROS: negative for - epistaxis, nasal discharge, oral lesions, sore throat, tinnitus or vertigo Allergy and Immunology ROS: negative for - hives or itchy/watery eyes Hematological and Lymphatic ROS: negative for - bleeding problems, bruising or swollen lymph nodes Endocrine  ROS: negative for - galactorrhea, hair pattern changes, polydipsia/polyuria or temperature intolerance Respiratory ROS: negative for - cough, hemoptysis, shortness of breath or wheezing Cardiovascular ROS: negative for - chest pain, dyspnea on exertion, edema or irregular heartbeat Gastrointestinal ROS: negative for - abdominal pain, diarrhea, hematemesis, nausea/vomiting or stool incontinence Genito-Urinary ROS: negative for - dysuria, hematuria, incontinence or urinary frequency/urgency Musculoskeletal ROS: left hip pain Neurological ROS: as noted in HPI Dermatological ROS: negative for rash and skin lesion changes  Physical Examination: Blood pressure (!) 147/94, pulse 72, temperature 98.3 F (36.8 C), temperature source Oral, resp. rate 18, height 5' 2.5" (1.588 m), weight 58.1 kg (128 lb), SpO2 100 %.  HEENT-  Normocephalic, no lesions, without obvious abnormality.  Normal external eye and conjunctiva.  Normal TM's bilaterally.  Normal auditory canals and external ears. Normal external nose, mucus membranes and septum.  Normal pharynx. Cardiovascular- S1, S2 normal, pulses palpable throughout   Lungs- chest clear, no wheezing, rales, normal symmetric air entry Abdomen- soft, non-tender; bowel sounds normal; no masses,  no organomegaly Extremities- no edema Lymph-no adenopathy palpable Musculoskeletal-no joint tenderness, deformity or swelling Skin-warm and dry, no hyperpigmentation, vitiligo, or suspicious lesions  Neurological Examination Mental Status: Alert, thought content appropriate.  Speech fluent without evidence of aphasia.  Able to follow 3 step commands without difficulty. Cranial Nerves: II: Discs flat bilaterally; Visual fields grossly normal, pupils equal, round, reactive to light and accommodation III,IV, VI: ptosis not present, extra-ocular motions intact bilaterally V,VII: smile symmetric, facial light touch sensation normal bilaterally VIII: hearing normal  bilaterally IX,X: gag reflex present XI: bilateral shoulder shrug XII: midline tongue extension Motor: Able to lift all extremities against gravity but gives little resistance.  LLE weaker at 4+/5 Sensory: Pinprick and light touch intact throughout, bilaterally Deep Tendon Reflexes: 2+ and symmetric throughout Plantars: Right: downgoing   Left: downgoing Cerebellar: normal finger-to-nose and normal heel-to-shin testing bilaterally Gait: not tested due to safety concerns    Laboratory Studies:   Basic Metabolic Panel:  Recent Labs Lab 04/25/16 2020  NA 139  K 3.8  CL 105  CO2 24  GLUCOSE 87  BUN 25*  CREATININE 0.83  CALCIUM 9.7    Liver Function Tests:  Recent Labs Lab 04/25/16 2020  AST 29  ALT 24  ALKPHOS 80  BILITOT 0.5  PROT 7.4  ALBUMIN 4.0   No results for input(s): LIPASE, AMYLASE in the last 168 hours. No results for input(s): AMMONIA in the last 168 hours.  CBC:  Recent Labs Lab 04/25/16 2020  WBC 2.7*  HGB 13.7  HCT 42.4  MCV 85.9  PLT 189    Cardiac Enzymes: No results for input(s): CKTOTAL, CKMB, CKMBINDEX, TROPONINI in the last 168 hours.  BNP: Invalid input(s): POCBNP  CBG:  Recent Labs Lab 04/25/16 2120 04/26/16 0753  GLUCAP 84 95    Microbiology: Results for orders placed or performed during the hospital encounter  of 04/25/16  Blood culture (routine x 2)     Status: None (Preliminary result)   Collection Time: 04/25/16  8:50 PM  Result Value Ref Range Status   Specimen Description BLOOD RIGHT FOREARM  Final   Special Requests   Final    BOTTLES DRAWN AEROBIC AND ANAEROBIC AER12CC ANA11CC   Culture NO GROWTH < 12 HOURS  Final   Report Status PENDING  Incomplete  Blood culture (routine x 2)     Status: None (Preliminary result)   Collection Time: 04/25/16  8:50 PM  Result Value Ref Range Status   Specimen Description BLOOD LEFT AC  Final   Special Requests   Final    BOTTLES DRAWN AEROBIC AND ANAEROBIC ANA13CC  AER10CC   Culture NO GROWTH < 12 HOURS  Final   Report Status PENDING  Incomplete    Coagulation Studies: No results for input(s): LABPROT, INR in the last 72 hours.  Urinalysis:  Recent Labs Lab 04/25/16 2204  COLORURINE STRAW*  LABSPEC 1.011  PHURINE 7.0  GLUCOSEU NEGATIVE  HGBUR NEGATIVE  BILIRUBINUR NEGATIVE  KETONESUR NEGATIVE  PROTEINUR NEGATIVE  NITRITE NEGATIVE  LEUKOCYTESUR NEGATIVE    Lipid Panel:     Component Value Date/Time   CHOL 150 04/26/2016 0517   TRIG 51 04/26/2016 0517   HDL 59 04/26/2016 0517   CHOLHDL 2.5 04/26/2016 0517   VLDL 10 04/26/2016 0517   LDLCALC 81 04/26/2016 0517    HgbA1C:  Lab Results  Component Value Date   HGBA1C 6.3 (H) 01/09/2015    Urine Drug Screen:     Component Value Date/Time   LABOPIA NONE DETECTED 01/11/2015 2338   COCAINSCRNUR NONE DETECTED 01/11/2015 2338   LABBENZ NONE DETECTED 01/11/2015 2338   AMPHETMU NONE DETECTED 01/11/2015 2338   THCU NONE DETECTED 01/11/2015 2338   LABBARB NONE DETECTED 01/11/2015 2338    Alcohol Level: No results for input(s): ETH in the last 168 hours.   Imaging: Ct Head Wo Contrast  Result Date: 04/25/2016 CLINICAL DATA:  82 y/o  F; altered mental status. EXAM: CT HEAD WITHOUT CONTRAST TECHNIQUE: Contiguous axial images were obtained from the base of the skull through the vertex without intravenous contrast. COMPARISON:  03/12/2016 CT head. FINDINGS: Brain: No evidence of acute infarction, hemorrhage, hydrocephalus, extra-axial collection or mass lesion/mass effect. Confluent hypoattenuation of subcortical and periventricular white matter is compatible with advanced chronic microvascular ischemic changes. Chronic infarcts are present in the right posterior frontal lobe, bilateral thalamus and the right inferior cerebellum. Moderate brain parenchymal volume loss. Vascular: No hyperdense vessel. Extensive calcific atherosclerosis of the cavernous internal carotid arteries. Skull:  Normal. Negative for fracture or focal lesion. Sinuses/Orbits: No acute finding. Other: None. IMPRESSION: 1. No acute intracranial abnormality identified. 2. Advanced chronic microvascular ischemic changes of the brain, chronic infarcts, and moderate brain parenchymal volume loss. Electronically Signed   By: Mitzi Hansen M.D.   On: 04/25/2016 21:34   US Carotid Bilateral  Result Date: 04/26/2016 CLINICAL DATA:  Acute encephalopathy EXAM: BILATERAL CAROTID DUPLEX ULTRASOUND TECHNIQUE: Wallace Cullens scale imaging, color Doppler and duplex ultrasound were performed of bilateral carotid and vertebral arteries in the neck. COMPARISON:  None FINDINGS: Criteria: Quantification of carotid stenosis is based on velocity parameters that correlate the residual internal carotid diameter with NASCET-based stenosis levels, using the diameter of the distal internal carotid lumen as the denominator for stenosis measurement. The following velocity measurements were obtained: RIGHT ICA:  53 cm/sec CCA:  68 cm/sec SYSTOLIC ICA/CCA RATIO:  0.8 DIASTOLIC ICA/CCA RATIO:  1.3 ECA:  47 cm/sec LEFT ICA:  54 cm/sec CCA:  70 cm/sec SYSTOLIC ICA/CCA RATIO:  0.8 DIASTOLIC ICA/CCA RATIO:  1.1 ECA:  63 cm/sec RIGHT CAROTID ARTERY: There is moderate mixed plaque in the bulb. Low resistance internal carotid Doppler pattern is preserved. RIGHT VERTEBRAL ARTERY:  Antegrade. LEFT CAROTID ARTERY: There is moderate focal calcified plaque in the bulb without obvious narrowing. Low resistance internal carotid Doppler pattern is preserved. LEFT VERTEBRAL ARTERY:  Antegrade. IMPRESSION: Less than 50% stenosis in the right and left internal carotid artery. Electronically Signed   By: Jolaine ClickArthur  Hoss M.D.   On: 04/26/2016 10:40     Assessment/Plan: 82 year old female presenting after an episode of unresponsiveness.  Etiology is unclear.  Head CT reviewed and shows no acute changes.  Patient unable to have an MRI due to pacer.  Carotid dopplers show  no evidence of hemodynamically significant stenosis.  Echocardiogram and A1c are pending, LDL 81.  Patient on Eliquis and compliant.  Recommendations: 1.  EEG 2.  Continue Eliquis 3.  Telemetry monitoring 4.  Frequent neuro checks 5.  If above work up unremarkable would have patient continue follow up on an outpatient basis.   Thana FarrLeslie Anessia Oakland, MD Neurology [phone] 04/26/2016, 10:59 AM

## 2016-04-26 NOTE — Evaluation (Signed)
Clinical/Bedside Swallow Evaluation Patient Details  Name: Pamelia Hoitnnie C Wisz MRN: [id number] Date of Birth: 07/07/1930  Today's Date: 04/26/2016 Time: SLP Start Time (ACUTE ONLY): 1200 SLP Stop Time (ACUTE ONLY): 1300 SLP Time Calculation (min) (ACUTE ONLY): 60 min  Past Medical History:  Past Medical History:  Diagnosis Date  . CHF (congestive heart failure) (HCC)   . Diabetes mellitus without complication (HCC)   . Hypertension   . Pacemaker   . Stroke [facility](HCC)    Past Surgical History:  Past Surgical History:  Procedure Laterality Date  . ABDOMINAL HYSTERECTOMY    . JOINT REPLACEMENT    . PACEMAKER PLACEMENT     HPI:  Pt is a 82 y.o. female who presents with An episode of decreased responsiveness and confusion at home. She was found by her family sitting in her chair and was difficult to wake up, and was somewhat confused. Here in the ED workup was largely negative at first, but she does have a significant history of embolic strokes. She has some persistent residual weakness from her prior strokes. Hospitals were called for admission for further evaluation for possible stroke. Of note, patient had told her daughter that she did not want to stay. Initially on evaluation for this writer she was oriented, and was able to relate back the reasons we wanted her to stay. However, she refused to state out right whether or not she wanted to stay or go home. After quite a bit of conversation about the matter she refused to speak any further, and became uncooperative, refusing to speak at all or open her eyes.   Assessment / Plan / Recommendation Clinical Impression  Pt appeared to adequately tolerate po trials of thin liquids, via straw, and soft solids with no immediate overt s/s of aspiration. Pt appeares at a reduced risk for aspiration following general aspiration precautions. Pt and Granddaughter were educated regarding general aspiration precautions and the importance of following general  aspiration precautions. Nursing consulted. Recommend meds be given whole in puree for easier swallowing. Recommend continue a Dysphagia 3 diet with thin liquids as ordered by MD for easier mastication. ST services are available for additional education and evaluation if a decline in status during admission.     Aspiration Risk   (Reduced Risk)    Diet Recommendation Dysphagia 3 (Mech soft);Thin liquid   Liquid Administration via: Cup;Straw Medication Administration: Whole meds with puree Supervision: Patient able to self feed;Intermittent supervision to cue for compensatory strategies Compensations: Minimize environmental distractions;Slow rate;Small sips/bites;Follow solids with liquid Postural Changes: Seated upright at 90 degrees;Remain upright for at least 30 minutes after po intake    Other  Recommendations Recommended Consults:  (Dietician Consult) Oral Care Recommendations: Oral care BID;Staff/trained caregiver to provide oral care   Follow up Recommendations None      Frequency and Duration            Prognosis Prognosis for Safe Diet Advancement: Good      Swallow Study   General Date of Onset: 04/25/16 HPI: Pt is a 82 y.o. female who presents with An episode of decreased responsiveness and confusion at home. She was found by her family sitting in her chair and was difficult to wake up, and was somewhat confused. Here in the ED workup was largely negative at first, but she does have a significant history of embolic strokes. She has some persistent residual weakness from her prior strokes. Hospitals were called for admission for further evaluation for possible stroke. Of note,  patient had told her daughter that she did not want to stay. Initially on evaluation for this writer she was oriented, and was able to relate back the reasons we wanted her to stay. However, she refused to state out right whether or not she wanted to stay or go home. After quite a bit of conversation about  the matter she refused to speak any further, and became uncooperative, refusing to speak at all or open her eyes. Type of Study: Bedside Swallow Evaluation Previous Swallow Assessment: none indicated Diet Prior to this Study: Regular;Thin liquids Temperature Spikes Noted: No (WBC 2.7) Respiratory Status: Room air History of Recent Intubation: No Behavior/Cognition: Alert;Cooperative;Pleasant mood Oral Cavity Assessment: Within Functional Limits Oral Care Completed by SLP: Recent completion by staff Vision: Functional for self-feeding Self-Feeding Abilities: Able to feed self;Needs set up Patient Positioning: Upright in bed Baseline Vocal Quality: Normal Volitional Cough: Strong Volitional Swallow: Able to elicit    Oral/Motor/Sensory Function Overall Oral Motor/Sensory Function: Within functional limits   Ice Chips Ice chips: Not tested   Thin Liquid Thin Liquid: Within functional limits Presentation: Straw (2 trials)    Nectar Thick Nectar Thick Liquid: Not tested   Honey Thick Honey Thick Liquid: Not tested   Puree Puree: Not tested   Solid   GO   Solid: Within functional limits Presentation: Self Fed;Spoon (3 trials )        Altamese DillingLauren Muller, B.A. Clinical Graduate Student  04/26/2016,1:01 PM  This information has been reviewed and agreed upon by this supervising clinician.  Jerilynn SomKatherine Watson, MS, CCC-SLP

## 2016-04-26 NOTE — Care Management Obs Status (Signed)
MEDICARE OBSERVATION STATUS NOTIFICATION   Patient Details  Name: Tamara Campbell MRN: [id number] Date of Birth: 11/03/1930   Medicare Observation Status Notification Given:  Yes    Gwenette GreetBrenda S Flois Mctague, RN 04/26/2016, 11:00 AM

## 2016-04-26 NOTE — Progress Notes (Signed)
CH responded to an OR for an AD. Pt was in the bed. Son was bedside. Pt and son declined education for the AD and did not wish for the literature to be left with them. CH is available for follow up as needed.    04/26/16 1000  Clinical Encounter Type  Visited With Patient;Patient and family together  Visit Type Initial;Spiritual support  Referral From Nurse  Spiritual Encounters  Spiritual Needs Literature

## 2016-04-26 NOTE — Evaluation (Signed)
Occupational Therapy Evaluation Patient Details Name: Tamara Campbell MRN: [id number] DOB: 06/22/1930 Today's Date: 04/26/2016    History of Present Illness Pt admitted for acute encephalopathy. Pt with complaints of AMS. Pt with pending neuro consult. Very poor historian, unable to elaborate on PLOF. Pt with history of prior CVA (with residual R UE weakness), CHF, DM, and HTN. Upon admission, family found pt sitting in chair very confused.   Clinical Impression   Pt is 82 year old female who presents with acute encephalopathy.  Pt presents with confusion and a delay in responses but cooperative.  Pt has poor problem solving skills and sequencing skills during bed mobility and ADLs.  Pt is able to complete automatic tasks but tasks requiring planning are difficult.  Patient needed mod assist for LB dressing sitting EOB with mod assist for bed mobility with mod cues.  Pt is able to feed himself and perform hygiene tasks with extra time and cues.  Pt has full AROM of BUEs and hands but L grasp is weaker than R.  Coordination is intact but slow and requires extra time.  Rec continued OT while in hospital to continue to work on increasing independence in ADLs, balance and functional mobility training, coordination exercises and family ed and training. Patent is at risk for falls and would benefit from SNF for rehab    Follow Up Recommendations  SNF    Equipment Recommendations       Recommendations for Other Services       Precautions / Restrictions Precautions Precautions: Fall Restrictions Weight Bearing Restrictions: No      Mobility Bed Mobility                  Transfers                      Balance                                            ADL Overall ADL's : Needs assistance/impaired Eating/Feeding: Independent;Set up   Grooming: Wash/dry hands;Wash/dry face;Oral care;Brushing hair;Set up Grooming Details (indicate cue type and  reason): cues to initiate task and stay focused on task             Lower Body Dressing: Moderate assistance;Set up Lower Body Dressing Details (indicate cue type and reason): pt able to sit EOB briefly due to fatigue and decreased balance                     Vision     Perception     Praxis      Pertinent Vitals/Pain Pain Assessment: Faces Faces Pain Scale: Hurts little more Pain Location: L shoulder Pain Descriptors / Indicators: Aching Pain Intervention(s): Limited activity within patient's tolerance;Repositioned     Hand Dominance Right   Extremity/Trunk Assessment Upper Extremity Assessment Upper Extremity Assessment: Generalized weakness   Lower Extremity Assessment Lower Extremity Assessment: Defer to PT evaluation       Communication Communication Communication: No difficulties (mild expressive word finding problems)   Cognition Arousal/Alertness: Awake/alert Behavior During Therapy: Impulsive Overall Cognitive Status: Impaired/Different from baseline Area of Impairment: Orientation;Attention;Following commands;Safety/judgement;Problem solving Orientation Level: Place Current Attention Level: Selective Memory: Decreased short-term memory Following Commands: Follows one step commands consistently     Problem Solving: Slow processing;Decreased initiation;Difficulty sequencing;Requires verbal cues;Requires tactile cues  General Comments       Exercises       Shoulder Instructions      Home Living Family/patient expects to be discharged to:: Private residence Living Arrangements: Children Available Help at Discharge: Family Type of Home: House Home Access: Stairs to enter     Home Layout: One level     Bathroom Shower/Tub: Walk-in shower;Curtain   Bathroom Toilet: Handicapped height (has BSC over toilet)     Home Equipment: Environmental consultantWalker - 2 wheels;Bedside commode;Shower seat - built in;Grab bars - tub/shower          Prior  Functioning/Environment          Comments: Grand daughter Tamara Campbell indicated she was independent in all ADLs and she uses her RW for all mobility.  Her son helped with cooking, cleaning, groceries, etc        OT Problem List: Decreased strength;Decreased range of motion;Decreased activity tolerance;Impaired balance (sitting and/or standing);Decreased safety awareness   OT Treatment/Interventions: Self-care/ADL training;Therapeutic activities;Balance training;Patient/family education    OT Goals(Current goals can be found in the care plan section) Acute Rehab OT Goals Patient Stated Goal: To go home soon OT Goal Formulation: With patient/family Time For Goal Achievement: 05/10/16 Potential to Achieve Goals: Good ADL Goals Pt Will Perform Lower Body Dressing: with set-up;with min assist;sit to/from stand (using FWW and no LOB) Pt Will Transfer to Toilet: with set-up;with min assist;stand pivot transfer;bedside commode (BSC over toilet like at home)  OT Frequency: Min 1X/week   Barriers to D/C:            Co-evaluation              End of Session    Activity Tolerance: Patient limited by fatigue Patient left: in bed;with call bell/phone within reach;with bed alarm set;with family/visitor present   Time: 1545-1620 OT Time Calculation (min): 35 min Charges:  OT General Charges $OT Visit: 1 Procedure OT Evaluation $OT Eval Moderate Complexity: 1 Procedure OT Treatments $Self Care/Home Management : 8-22 mins G-Codes: OT G-codes **NOT FOR INPATIENT CLASS** Functional Limitation: Self care Self Care Current Status ([id number]): At least 40 percent but less than 60 percent impaired, limited or restricted Self Care Goal Status ([id number]): At least 20 percent but less than 40 percent impaired, limited or restricted   Tamara Campbell, OTR/L ascom [phone] 04/26/16, 4:34 PM

## 2016-04-26 NOTE — Progress Notes (Signed)
Patient awake and more alert able to complete bedside stroke swallow screen and NIH.

## 2016-04-26 NOTE — Progress Notes (Signed)
Patient goes to baseline with symptoms at this time. Worked with physical therapy. Due to weakness will need skilled nursing facility for PT. EEG pending. Discussed with Dr. Thad Rangereynolds of neurology. Likely discharge in 1-2 days.

## 2016-04-26 NOTE — Clinical Social Work Note (Signed)
MSW received consult for patient needing in home care, case manager can assist with providing home health PT.  Please consult case management for in home health care.  Ervin KnackEric R. Deann Mclaine, MSW [phone]  Mon-Fri 8a-4:30p 04/26/2016 8:58 AM

## 2016-04-27 DIAGNOSIS — G9340 Encephalopathy, unspecified: Secondary | ICD-10-CM | POA: Diagnosis not present

## 2016-04-27 DIAGNOSIS — I10 Essential (primary) hypertension: Secondary | ICD-10-CM | POA: Diagnosis not present

## 2016-04-27 DIAGNOSIS — I4891 Unspecified atrial fibrillation: Secondary | ICD-10-CM | POA: Diagnosis not present

## 2016-04-27 DIAGNOSIS — I639 Cerebral infarction, unspecified: Secondary | ICD-10-CM | POA: Diagnosis not present

## 2016-04-27 LAB — ECHOCARDIOGRAM COMPLETE
HEIGHTINCHES: 62.5 in
WEIGHTICAEL: 2048 [oz_av]

## 2016-04-27 LAB — HEMOGLOBIN A1C
Hgb A1c MFr Bld: 6 % — ABNORMAL HIGH (ref 4.8–5.6)
MEAN PLASMA GLUCOSE: 126 mg/dL

## 2016-04-27 LAB — GLUCOSE, CAPILLARY
GLUCOSE-CAPILLARY: 92 mg/dL (ref 65–99)
Glucose-Capillary: 124 mg/dL — ABNORMAL HIGH (ref 65–99)

## 2016-04-27 MED ORDER — CARVEDILOL 3.125 MG PO TABS
3.1250 mg | ORAL_TABLET | Freq: Two times a day (BID) | ORAL | Status: DC
  Administered 2016-04-27: 3.125 mg via ORAL
  Filled 2016-04-27: qty 1

## 2016-04-27 MED ORDER — LISINOPRIL 10 MG PO TABS
10.0000 mg | ORAL_TABLET | Freq: Every day | ORAL | Status: DC
  Administered 2016-04-27: 10 mg via ORAL
  Filled 2016-04-27: qty 1

## 2016-04-27 MED ORDER — AMLODIPINE BESYLATE 10 MG PO TABS
10.0000 mg | ORAL_TABLET | Freq: Every day | ORAL | Status: DC
  Administered 2016-04-27: 10:00:00 10 mg via ORAL
  Filled 2016-04-27: qty 1

## 2016-04-27 NOTE — Clinical Social Work Note (Addendum)
Patient to be d/c'ed today to [facility].  Patient and family agreeable to plans will transport via ems RN to call report to [facility] [phone].  3:40, MSW received phone call from patient's daughter who would rather take patient back home with home health.  MSW was informed that patient's son lives with her and is able to help take care of patient.  MSW notified case Production designer, theatre/television/filmmanager, insurance company, and [facility].  Windell MouldingEric Jalilah Wiltsie, MSW Mon-Fri 8a-4:30p [phone]

## 2016-04-27 NOTE — Progress Notes (Signed)
[employer] - [place] [facility]   PATIENT NAME: Tamara Campbell    MR#:  [id number]  DATE OF BIRTH:  09/17/1930  SUBJECTIVE:  CHIEF COMPLAINT:   Chief Complaint  Patient presents with  . Altered Mental Status   Patient back to normal. Some weakness. Waiting for bed at skilled nursing facility. Daughter at bedside.  REVIEW OF SYSTEMS:    Review of Systems  Constitutional: Negative for chills and fever.  HENT: Negative for sore throat.   Eyes: Negative for blurred vision, double vision and pain.  Respiratory: Negative for cough, hemoptysis, shortness of breath and wheezing.   Cardiovascular: Negative for chest pain, palpitations, orthopnea and leg swelling.  Gastrointestinal: Negative for abdominal pain, constipation, diarrhea, heartburn, nausea and vomiting.  Genitourinary: Negative for dysuria and hematuria.  Musculoskeletal: Negative for back pain and joint pain.  Skin: Negative for rash.  Neurological: Positive for focal weakness. Negative for sensory change, speech change and headaches.  Endo/Heme/Allergies: Does not bruise/bleed easily.  Psychiatric/Behavioral: Negative for depression. The patient is not nervous/anxious.     DRUG ALLERGIES:  No Known Allergies  VITALS:  Blood pressure (!) 155/89, pulse 73, temperature 98 F (36.7 C), temperature source Oral, resp. rate 20, height 5' 2.5" (1.588 m), weight 58.1 kg (128 lb), SpO2 99 %.  PHYSICAL EXAMINATION:   Physical Exam  GENERAL:  82 y.o.-year-old patient lying in the bed with no acute distress.  EYES: Pupils equal, round, reactive to light and accommodation. No scleral icterus. Extraocular muscles intact.  HEENT: Head atraumatic, normocephalic. Oropharynx and nasopharynx clear.  NECK:  Supple, no jugular venous distention. No thyroid enlargement, no tenderness.  LUNGS: Normal breath sounds bilaterally, no wheezing, rales, rhonchi. No use of accessory muscles of respiration.  CARDIOVASCULAR: S1,  S2 normal. No murmurs, rubs, or gallops.  ABDOMEN: Soft, nontender, nondistended. Bowel sounds present. No organomegaly or mass.  EXTREMITIES: No cyanosis, clubbing or edema b/l.    NEUROLOGIC: Cranial nerves II through XII are intact.Left lower extremity 4/5 PSYCHIATRIC: The patient is alert and oriented x 3.  SKIN: No obvious rash, lesion, or ulcer.   LABORATORY PANEL:   CBC  Recent Labs Lab 04/25/16 2020  WBC 2.7*  HGB 13.7  HCT 42.4  PLT 189   ------------------------------------------------------------------------------------------------------------------ Chemistries   Recent Labs Lab 04/25/16 2020  NA 139  K 3.8  CL 105  CO2 24  GLUCOSE 87  BUN 25*  CREATININE 0.83  CALCIUM 9.7  AST 29  ALT 24  ALKPHOS 80  BILITOT 0.5   ------------------------------------------------------------------------------------------------------------------  Cardiac Enzymes No results for input(s): TROPONINI in the last 168 hours. ------------------------------------------------------------------------------------------------------------------  RADIOLOGY:  Ct Head Wo Contrast  Result Date: 04/25/2016 CLINICAL DATA:  82 y/o  F; altered mental status. EXAM: CT HEAD WITHOUT CONTRAST TECHNIQUE: Contiguous axial images were obtained from the base of the skull through the vertex without intravenous contrast. COMPARISON:  03/12/2016 CT head. FINDINGS: Brain: No evidence of acute infarction, hemorrhage, hydrocephalus, extra-axial collection or mass lesion/mass effect. Confluent hypoattenuation of subcortical and periventricular white matter is compatible with advanced chronic microvascular ischemic changes. Chronic infarcts are present in the right posterior frontal lobe, bilateral thalamus and the right inferior cerebellum. Moderate brain parenchymal volume loss. Vascular: No hyperdense vessel. Extensive calcific atherosclerosis of the cavernous internal carotid arteries. Skull: Normal.  Negative for fracture or focal lesion. Sinuses/Orbits: No acute finding. Other: None. IMPRESSION: 1. No acute intracranial abnormality identified. 2. Advanced chronic microvascular ischemic changes of the brain, chronic infarcts,  and moderate brain parenchymal volume loss. Electronically Signed   By: Mitzi HansenLance  Furusawa-Stratton M.D.   On: 04/25/2016 21:34   [place] Carotid Bilateral  Result Date: 04/26/2016 CLINICAL DATA:  Acute encephalopathy EXAM: BILATERAL CAROTID DUPLEX ULTRASOUND TECHNIQUE: Wallace CullensGray scale imaging, color Doppler and duplex ultrasound were performed of bilateral carotid and vertebral arteries in the neck. COMPARISON:  None FINDINGS: Criteria: Quantification of carotid stenosis is based on velocity parameters that correlate the residual internal carotid diameter with NASCET-based stenosis levels, using the diameter of the distal internal carotid lumen as the denominator for stenosis measurement. The following velocity measurements were obtained: RIGHT ICA:  53 cm/sec CCA:  68 cm/sec SYSTOLIC ICA/CCA RATIO:  0.8 DIASTOLIC ICA/CCA RATIO:  1.3 ECA:  47 cm/sec LEFT ICA:  54 cm/sec CCA:  70 cm/sec SYSTOLIC ICA/CCA RATIO:  0.8 DIASTOLIC ICA/CCA RATIO:  1.1 ECA:  63 cm/sec RIGHT CAROTID ARTERY: There is moderate mixed plaque in the bulb. Low resistance internal carotid Doppler pattern is preserved. RIGHT VERTEBRAL ARTERY:  Antegrade. LEFT CAROTID ARTERY: There is moderate focal calcified plaque in the bulb without obvious narrowing. Low resistance internal carotid Doppler pattern is preserved. LEFT VERTEBRAL ARTERY:  Antegrade. IMPRESSION: Less than 50% stenosis in the right and left internal carotid artery. Electronically Signed   By: Jolaine ClickArthur  Hoss M.D.   On: 04/26/2016 10:40     ASSESSMENT AND PLAN:   * TIA CT Head showed nothing acute. Discussed with Dr. Thad Rangereynolds. EEG normal. No change in medications.  *  Atrial fibrillation (HCC) - continue home meds including anticoagulation  * Essential  hypertension  Restart home medications  * Diabetes type 2, controlled (HCC)   * Hyperlipidemia LDL goal <70 - continue home meds  All the records are reviewed and case discussed with Care Management/Social Workerr. Management plans discussed with the patient, family and they are in agreement.  CODE STATUS: FULL  DVT Prophylaxis: SCDs  TOTAL TIME TAKING CARE OF THIS PATIENT: 35 minutes.   POSSIBLE D/C IN 1-2 DAYS, DEPENDING ON CLINICAL CONDITION.  Milagros LollSudini, Lorrain Rivers R M.D on 04/27/2016 at 12:11 PM  Between 7am to 6pm - Pager - [phone]  After 6pm go to [email or web address] - password [email or web address]  [facility] [place] [facility]  Office  [phone]  CC: Primary care physician; Rafael BihariWALKER III, JOHN B, MD  Note: This dictation was prepared with Dragon dictation along with smaller phrase technology. Any transcriptional errors that result from this process are unintentional.

## 2016-04-27 NOTE — Clinical Social Work Placement (Signed)
   CLINICAL SOCIAL WORK PLACEMENT  NOTE  Date:  04/27/2016  Patient Details  Name: Tamara Campbell MRN: [id number] Date of Birth: 03/27/1931  Clinical Social Work is seeking post-discharge placement for this patient at the Skilled  Nursing Facility level of care (*CSW will initial, date and re-position this form in  chart as items are completed):  Yes   Patient/family provided with [place] Clinical Social Work Department's list of facilities offering this level of care within the geographic area requested by the patient (or if unable, by the patient's family).  Yes   Patient/family informed of their freedom to choose among providers that offer the needed level of care, that participate in Medicare, Medicaid or managed care program needed by the patient, have an available bed and are willing to accept the patient.  Yes   Patient/family informed of [facility]'s ownership interest in [facility] and [facility], as well as of the fact that they are under no obligation to receive care at these facilities.  PASRR submitted to EDS on 04/27/16     PASRR number received on       Existing PASRR number confirmed on 04/27/16     FL2 transmitted to all facilities in geographic area requested by pt/family on 04/27/16     FL2 transmitted to all facilities within larger geographic area on       Patient informed that his/her managed care company has contracts with or will negotiate with certain facilities, including the following:        Yes   Patient/family informed of bed offers received.  Patient chooses bed at [facility] [place]     Physician recommends and patient chooses bed at      Patient to be transferred to [facility] [place] on 04/27/16.  Patient to be transferred to facility by Patient's daughter's car     Patient family notified on 04/27/16 of transfer.  Name of family member notified:  Daughter Lonell GrandchildValerie Williams     PHYSICIAN       Additional  Comment:    _______________________________________________ Darleene CleaverAnterhaus, Yarianna Varble R 04/27/2016, 2:55 PM

## 2016-04-27 NOTE — Discharge Instructions (Addendum)
Diabetic diet  Activity with assistance - full weight bearing

## 2016-04-27 NOTE — Clinical Social Work Note (Signed)
Clinical Social Work Assessment  Patient Details  Name: Tamara Campbell MRN: [id number] Date of Birth: 04/02/1931  Date of referral:  04/27/16               Reason for consult:  Facility Placement                Permission sought to share information with:  Facility Medical sales representativeContact Representative, Family Supports Permission granted to share information::     Name::     Tamara Campbell,Tamara Campbell Daughter [phone]   Agency::  SNF admissions  Relationship::     Contact Information:     Housing/Transportation Living arrangements for the past 2 months:  Single Family Home Source of Information:  Patient, Adult Children Patient Interpreter Needed:  None Criminal Activity/Legal Involvement Pertinent to Current Situation/Hospitalization:  No - Comment as needed Significant Relationships:  Adult Children Lives with:  Adult Children Do you feel safe going back to the place where you live?  No Need for family participation in patient care:  Yes (Comment)  Care giving concerns: Patient and daughter feel she needs some short term rehab before she is able to return back home.   Social Worker assessment / plan:  Patient is a 82 year old female who lives with her son, patient is alert and oriented x2 and able to express her feelings.  Patient states she lives with her son, and he helps take her to places she needs to go, and also helps take care of her.  Patient was talkative and friendly, patient expressed she has been to rehab in the past, but it has been awhile.  MSW spoke to patient and her daughter who was on the phone, and explained role of MSW and process for finding a SNF bed.  Patient and daughter were explained how insurance will pay for her and stay at SNF.  They did not express any other questions or concerns.  Patient and family agreeable to going to SNF for short term rehab, and gave MSW permission to fax patient's information to [facility].  Employment status:  Retired Glass blower/designernsurance  information:  Managed Medicare PT Recommendations:  Skilled Nursing Facility Information / Referral to community resources:  Skilled Nursing Facility  Patient/Family's Response to care: Patient and family agreeable to going to SNF for short term rehab.  Patient/Family's Understanding of and Emotional Response to Diagnosis, Current Treatment, and Prognosis:  Patient and family aware of current prognosis and treatment plan, they are hopeful she will not have to be at SNF very long.   Emotional Assessment Appearance:  Appears stated age Attitude/Demeanor/Rapport:    Affect (typically observed):  Appropriate, Calm, Pleasant Orientation:  Oriented to Self, Oriented to Place Alcohol / Substance use:  Not Applicable Psych involvement (Current and /or in the community):  No (Comment)  Discharge Needs  Concerns to be addressed:  Lack of Support Readmission within the last 30 days:  No Current discharge risk:  None Barriers to Discharge:  Insurance Authorization   Darleene Cleavernterhaus, Kaycee Mcgaugh R 04/27/2016, 1:32 PM

## 2016-04-27 NOTE — NC FL2 (Signed)
[place] MEDICAID FL2 LEVEL OF CARE SCREENING TOOL     IDENTIFICATION  Patient Name: Tamara Campbell Birthdate: 07/14/1930 Sex: female Admission Date (Current Location): 04/25/2016  [place]   Facility and Address:  [facility], [address], [place], [place] [address]      Provider Number: [id number]  Attending Physician Name and Address:  Milagros LollSrikar Sudini, MD  Relative Name and Phone Number:  Tommie RaymondWilliams,Valerie W Daughter [phone] or Bolivar HawWalker,Anthony Son [phone]     Current Level of Care: Hospital Recommended Level of Care: Skilled Nursing Facility Prior Approval Number:    Date Approved/Denied:   PASRR Number: [id number]  Discharge Plan: SNF    Current Diagnoses: Patient Active Problem List   Diagnosis Date Noted  . Acute encephalopathy 04/26/2016  . Embolic stroke (HCC) 03/16/2015  . Risk for falls 03/16/2015  . Essential hypertension 01/13/2015  . Hyperlipidemia LDL goal <70 01/13/2015  . Diabetes type 2, controlled (HCC) 01/13/2015  . Paroxysmal ventricular tachycardia (HCC) 01/13/2015  . Hypokalemia 01/13/2015  . Open wound of left lower leg 01/13/2015  . Cerebral infarction due to embolism of middle cerebral artery (HCC)   . Atrial fibrillation (HCC) 01/10/2015  . Cardiomyopathy (HCC) 01/10/2015  . Cerebral infarction (HCC) 01/08/2015    Orientation RESPIRATION BLADDER Height & Weight     Self, Situation, Place  Normal Incontinent Weight: 128 lb (58.1 kg) Height:  5' 2.5" (158.8 cm)  BEHAVIORAL SYMPTOMS/MOOD NEUROLOGICAL BOWEL NUTRITION STATUS      Incontinent Diet (Dysphagia 3 diet)  AMBULATORY STATUS COMMUNICATION OF NEEDS Skin   Limited Assist Verbally Other (Comment) (Diabetic ulcer lower left leg)                       Personal Care Assistance Level of Assistance  Bathing, Feeding, Dressing Bathing Assistance: Limited assistance Feeding assistance: Independent Dressing  Assistance: Limited assistance     Functional Limitations Info  Sight, Speech, Hearing Sight Info: Adequate Hearing Info: Adequate Speech Info: Adequate    SPECIAL CARE FACTORS FREQUENCY  PT (By licensed PT), OT (By licensed OT)     PT Frequency: 5x a week OT Frequency: 5x a week            Contractures Contractures Info: Not present    Additional Factors Info  Allergies, Insulin Sliding Scale, Code Status Code Status Info: Full Code Allergies Info: NKA   Insulin Sliding Scale Info: insulin aspart (novoLOG) injection 0-5 Units at bedtime, insulin aspart (novoLOG) injection 0-9 Units 3x a day with meals       Current Medications (04/27/2016):  This is the current hospital active medication list Current Facility-Administered Medications  Medication Dose Route Frequency Provider Last Rate Last Dose  . amLODipine (NORVASC) tablet 10 mg  10 mg Oral Daily Milagros LollSrikar Sudini, MD   10 mg at 04/27/16 0931  . apixaban (ELIQUIS) tablet 2.5 mg  2.5 mg Oral BID Oralia Manisavid Willis, MD   2.5 mg at 04/27/16 [id number]  . atorvastatin (LIPITOR) tablet 40 mg  40 mg Oral q1800 Milagros LollSrikar Sudini, MD   40 mg at 04/26/16 1730  . carvedilol (COREG) tablet 3.125 mg  3.125 mg Oral BID WC Milagros LollSrikar Sudini, MD   3.125 mg at 04/27/16 0924  . insulin aspart (novoLOG) injection 0-5 Units  0-5 Units Subcutaneous QHS Oralia Manisavid Willis, MD      . insulin aspart (novoLOG) injection 0-9 Units  0-9 Units Subcutaneous TID WC Oralia Manisavid Willis, MD  1 Units at 04/27/16 1205  . latanoprost (XALATAN) 0.005 % ophthalmic solution 1 drop  1 drop Both Eyes QHS Oralia Manisavid Willis, MD   1 drop at 04/26/16 2223  . lisinopril (PRINIVIL,ZESTRIL) tablet 10 mg  10 mg Oral Daily Milagros LollSrikar Sudini, MD   10 mg at 04/27/16 1204     Discharge Medications: Please see discharge summary for a list of discharge medications.  Relevant Imaging Results:  Relevant Lab Results:   Additional Information SSN [id number]  Darleene Cleavernterhaus, Tamara Campbell

## 2016-04-27 NOTE — Plan of Care (Signed)
MD making rounds. Received order to discharge home. IV removed. Patient provided with Education Handout. Discharge paperwork provided, explained, signed and witnessed. No unanswered questions. Discharged via wheelchair by nursing staff. Belongings sent with patient and family.

## 2016-04-27 NOTE — Care Management (Signed)
Would like to go home with home health, therapy and social worker. Dr. Elpidio AnisSudini updated. Daughter chose [facility], [name], [facility] representative updated, Discharge to home today per Dr. Elpidio AnisSudini. Family will transport. [name] RN MSN CCM Care Management

## 2016-04-27 NOTE — Discharge Summary (Signed)
[employer] - [place] [facility]   PATIENT NAME: Tamara Campbell    MR#:  [id number]  DATE OF BIRTH:  12/05/1930  DATE OF ADMISSION:  04/25/2016 ADMITTING PHYSICIAN: Oralia Manisavid Willis, MD  DATE OF DISCHARGE: 04/27/2016  PRIMARY CARE PHYSICIAN: Rafael BihariWALKER III, JOHN B, MD   ADMISSION DIAGNOSIS:  Confusion [R41.0] Altered mental status, unspecified altered mental status type [R41.82]  DISCHARGE DIAGNOSIS:  Principal Problem:   Acute encephalopathy Active Problems:   Cerebral infarction [facility](HCC)   Atrial fibrillation (HCC)   Essential hypertension   Hyperlipidemia LDL goal <70   Diabetes type 2, controlled (HCC)   SECONDARY DIAGNOSIS:   Past Medical History:  Diagnosis Date  . CHF (congestive heart failure) (HCC)   . Diabetes mellitus without complication (HCC)   . Hypertension   . Pacemaker   . Stroke [facility](HCC)      ADMITTING HISTORY  HISTORY OF PRESENT ILLNESS:  Tamara Campbell  is a 82 y.o. female who presents with An episode of decreased responsiveness and confusion at home. She was found by her family sitting in her chair and was difficult to wake up, and was somewhat confused. Here in the ED workup was largely negative at first, but she does have a significant history of embolic strokes. She has some persistent residual weakness from her prior strokes. Hospitals were called for admission for further evaluation for possible stroke. Of note, patient had told her daughter that she did not want to stay. Initially on evaluation for this writer she was oriented, and was able to relate back the reasons we wanted her to stay. However, she refused to state out right whether or not she wanted to stay or go home. After quite a bit of conversation about the matter she refused to speak any further, and became uncooperative, refusing to speak at all or open her eyes.   HOSPITAL COURSE:   * TIA CT Head showed nothing acute. Discussed with Dr. Thad Rangereynolds. EEG normal. No change in  medications.  * Atrial fibrillation (HCC) - continue home meds including anticoagulation  *Essential hypertension  Restart home medications  *Diabetes type 2, controlled (HCC)  *Hyperlipidemia LDL goal <70 - continue home meds  Discharge to skilled nursing facility for physical therapy.  CONSULTS OBTAINED:  Treatment Team:  Kym GroomNeuro1 [facility], MD Thana FarrLeslie Reynolds, MD  DRUG ALLERGIES:  No Known Allergies  DISCHARGE MEDICATIONS:   Current Discharge Medication List    CONTINUE these medications which have NOT CHANGED   Details  acetaminophen (TYLENOL) 500 MG tablet Take 500 mg by mouth every 4 (four) hours as needed for mild pain or fever.    amLODipine-benazepril (LOTREL) 10-20 MG capsule Take 1 capsule by mouth daily.    apixaban (ELIQUIS) 5 MG TABS tablet Take 1 tablet (5 mg total) by mouth 2 (two) times daily. Qty: 60 tablet, Refills: 2    atorvastatin (LIPITOR) 20 MG tablet Take 1 tablet (20 mg total) by mouth daily at 6 PM. Qty: 30 tablet, Refills: 2    bimatoprost (LUMIGAN) 0.01 % SOLN Place 1 drop into both eyes at bedtime.    carvedilol (COREG) 6.25 MG tablet Take 1 tablet (6.25 mg total) by mouth 2 (two) times daily with a meal. Qty: 60 tablet, Refills: 2    cholecalciferol (VITAMIN D) 1000 UNITS tablet Take 2,000 Units by mouth daily.    hydrochlorothiazide (HYDRODIURIL) 25 MG tablet Take 25 mg by mouth daily.    metFORMIN (GLUCOPHAGE) 500 MG tablet Take 500 mg by  mouth 2 (two) times daily with a meal.    Multiple Vitamin (MULTIVITAMIN WITH MINERALS) TABS tablet Take 1 tablet by mouth daily.    potassium chloride (K-DUR,KLOR-CON) 10 MEQ tablet Take 10 mEq by mouth daily.    neomycin-bacitracin-polymyxin (NEOSPORIN) OINT Apply 1 application topically daily. Apply Neosporin to left leg wound Q day, then cover with foam dressing.  Change foam dressing Q 5 days or PRN soiling. Qty: 1 Tube, Refills: prn      STOP taking these medications      lisinopril (PRINIVIL,ZESTRIL) 5 MG tablet        Today   VITAL SIGNS:  Blood pressure (!) 155/89, pulse 73, temperature 98 F (36.7 C), temperature source Oral, resp. rate 20, height 5' 2.5" (1.588 m), weight 58.1 kg (128 lb), SpO2 99 %.  I/O:   Intake/Output Summary (Last 24 hours) at 04/27/16 1215 Last data filed at 04/27/16 0800  Gross per 24 hour  Intake              480 ml  Output                0 ml  Net              480 ml    PHYSICAL EXAMINATION:  Physical Exam  GENERAL:  82 y.o.-year-old patient lying in the bed with no acute distress.  LUNGS: Normal breath sounds bilaterally, no wheezing, rales,rhonchi or crepitation. No use of accessory muscles of respiration.  CARDIOVASCULAR: S1, S2 normal. No murmurs, rubs, or gallops.  ABDOMEN: Soft, non-tender, non-distended. Bowel sounds present. No organomegaly or mass.  NEUROLOGIC: Moves all 4 extremities. Left lower extremity 4/5 PSYCHIATRIC: The patient is alert and oriented x 3.  SKIN: No obvious rash, lesion, or ulcer.   DATA REVIEW:   CBC  Recent Labs Lab 04/25/16 2020  WBC 2.7*  HGB 13.7  HCT 42.4  PLT 189    Chemistries   Recent Labs Lab 04/25/16 2020  NA 139  K 3.8  CL 105  CO2 24  GLUCOSE 87  BUN 25*  CREATININE 0.83  CALCIUM 9.7  AST 29  ALT 24  ALKPHOS 80  BILITOT 0.5    Cardiac Enzymes No results for input(s): TROPONINI in the last 168 hours.  Microbiology Results  Results for orders placed or performed during the hospital encounter of 04/25/16  Blood culture (routine x 2)     Status: None (Preliminary result)   Collection Time: 04/25/16  8:50 PM  Result Value Ref Range Status   Specimen Description BLOOD RIGHT FOREARM  Final   Special Requests   Final    BOTTLES DRAWN AEROBIC AND ANAEROBIC AER12CC ANA11CC   Culture NO GROWTH 2 DAYS  Final   Report Status PENDING  Incomplete  Blood culture (routine x 2)     Status: None (Preliminary result)   Collection Time: 04/25/16  8:50 PM   Result Value Ref Range Status   Specimen Description BLOOD LEFT AC  Final   Special Requests   Final    BOTTLES DRAWN AEROBIC AND ANAEROBIC ANA13CC AER10CC   Culture NO GROWTH 2 DAYS  Final   Report Status PENDING  Incomplete    RADIOLOGY:  Ct Head Wo Contrast  Result Date: 04/25/2016 CLINICAL DATA:  82 y/o  F; altered mental status. EXAM: CT HEAD WITHOUT CONTRAST TECHNIQUE: Contiguous axial images were obtained from the base of the skull through the vertex without intravenous contrast. COMPARISON:  03/12/2016 CT head.  FINDINGS: Brain: No evidence of acute infarction, hemorrhage, hydrocephalus, extra-axial collection or mass lesion/mass effect. Confluent hypoattenuation of subcortical and periventricular white matter is compatible with advanced chronic microvascular ischemic changes. Chronic infarcts are present in the right posterior frontal lobe, bilateral thalamus and the right inferior cerebellum. Moderate brain parenchymal volume loss. Vascular: No hyperdense vessel. Extensive calcific atherosclerosis of the cavernous internal carotid arteries. Skull: Normal. Negative for fracture or focal lesion. Sinuses/Orbits: No acute finding. Other: None. IMPRESSION: 1. No acute intracranial abnormality identified. 2. Advanced chronic microvascular ischemic changes of the brain, chronic infarcts, and moderate brain parenchymal volume loss. Electronically Signed   By: Mitzi HansenLance  Furusawa-Stratton M.D.   On: 04/25/2016 21:34   [place] Carotid Bilateral  Result Date: 04/26/2016 CLINICAL DATA:  Acute encephalopathy EXAM: BILATERAL CAROTID DUPLEX ULTRASOUND TECHNIQUE: Wallace CullensGray scale imaging, color Doppler and duplex ultrasound were performed of bilateral carotid and vertebral arteries in the neck. COMPARISON:  None FINDINGS: Criteria: Quantification of carotid stenosis is based on velocity parameters that correlate the residual internal carotid diameter with NASCET-based stenosis levels, using the diameter of the distal  internal carotid lumen as the denominator for stenosis measurement. The following velocity measurements were obtained: RIGHT ICA:  53 cm/sec CCA:  68 cm/sec SYSTOLIC ICA/CCA RATIO:  0.8 DIASTOLIC ICA/CCA RATIO:  1.3 ECA:  47 cm/sec LEFT ICA:  54 cm/sec CCA:  70 cm/sec SYSTOLIC ICA/CCA RATIO:  0.8 DIASTOLIC ICA/CCA RATIO:  1.1 ECA:  63 cm/sec RIGHT CAROTID ARTERY: There is moderate mixed plaque in the bulb. Low resistance internal carotid Doppler pattern is preserved. RIGHT VERTEBRAL ARTERY:  Antegrade. LEFT CAROTID ARTERY: There is moderate focal calcified plaque in the bulb without obvious narrowing. Low resistance internal carotid Doppler pattern is preserved. LEFT VERTEBRAL ARTERY:  Antegrade. IMPRESSION: Less than 50% stenosis in the right and left internal carotid artery. Electronically Signed   By: Jolaine ClickArthur  Hoss M.D.   On: 04/26/2016 10:40    Follow up with PCP in 1 week.  Management plans discussed with the patient, family and they are in agreement.  CODE STATUS:     Code Status Orders        Start     Ordered   04/26/16 0322  Full code  Continuous     04/26/16 0321    Code Status History    Date Active Date Inactive Code Status Order ID Comments User Context   01/09/2015 12:24 AM 01/13/2015  7:20 PM Full Code [id number]  Thana FarrLeslie Reynolds, MD Inpatient      TOTAL TIME TAKING CARE OF THIS PATIENT ON DAY OF DISCHARGE: more than 30 minutes.   Milagros LollSudini, Raesean Bartoletti R M.D on 04/27/2016 at 12:15 PM  Between 7am to 6pm - Pager - [phone]  After 6pm go to [email or web address] - password [email or web address]  [facility]  Office  [phone]  CC: Primary care physician; Rafael BihariWALKER III, JOHN B, MD  Note: This dictation was prepared with Dragon dictation along with smaller phrase technology. Any transcriptional errors that result from this process are unintentional.

## 2016-05-02 DIAGNOSIS — I4891 Unspecified atrial fibrillation: Secondary | ICD-10-CM | POA: Diagnosis not present

## 2016-05-02 DIAGNOSIS — E119 Type 2 diabetes mellitus without complications: Secondary | ICD-10-CM | POA: Diagnosis not present

## 2016-05-02 DIAGNOSIS — G9340 Encephalopathy, unspecified: Secondary | ICD-10-CM | POA: Diagnosis not present

## 2016-05-02 DIAGNOSIS — I69398 Other sequelae of cerebral infarction: Secondary | ICD-10-CM | POA: Diagnosis not present

## 2016-05-02 DIAGNOSIS — R531 Weakness: Secondary | ICD-10-CM | POA: Diagnosis not present

## 2016-05-02 DIAGNOSIS — I509 Heart failure, unspecified: Secondary | ICD-10-CM | POA: Diagnosis not present

## 2016-05-02 DIAGNOSIS — Z7901 Long term (current) use of anticoagulants: Secondary | ICD-10-CM | POA: Diagnosis not present

## 2016-05-02 DIAGNOSIS — Z950 Presence of cardiac pacemaker: Secondary | ICD-10-CM | POA: Diagnosis not present

## 2016-05-02 DIAGNOSIS — I110 Hypertensive heart disease with heart failure: Secondary | ICD-10-CM | POA: Diagnosis not present

## 2016-05-04 DIAGNOSIS — Z7901 Long term (current) use of anticoagulants: Secondary | ICD-10-CM | POA: Diagnosis not present

## 2016-05-04 DIAGNOSIS — E119 Type 2 diabetes mellitus without complications: Secondary | ICD-10-CM | POA: Diagnosis not present

## 2016-05-04 DIAGNOSIS — I509 Heart failure, unspecified: Secondary | ICD-10-CM | POA: Diagnosis not present

## 2016-05-04 DIAGNOSIS — R531 Weakness: Secondary | ICD-10-CM | POA: Diagnosis not present

## 2016-05-04 DIAGNOSIS — G9340 Encephalopathy, unspecified: Secondary | ICD-10-CM | POA: Diagnosis not present

## 2016-05-04 DIAGNOSIS — Z950 Presence of cardiac pacemaker: Secondary | ICD-10-CM | POA: Diagnosis not present

## 2016-05-04 DIAGNOSIS — I110 Hypertensive heart disease with heart failure: Secondary | ICD-10-CM | POA: Diagnosis not present

## 2016-05-04 DIAGNOSIS — I69398 Other sequelae of cerebral infarction: Secondary | ICD-10-CM | POA: Diagnosis not present

## 2016-05-04 DIAGNOSIS — I4891 Unspecified atrial fibrillation: Secondary | ICD-10-CM | POA: Diagnosis not present

## 2016-05-06 DIAGNOSIS — I69398 Other sequelae of cerebral infarction: Secondary | ICD-10-CM | POA: Diagnosis not present

## 2016-05-06 DIAGNOSIS — R531 Weakness: Secondary | ICD-10-CM | POA: Diagnosis not present

## 2016-05-06 DIAGNOSIS — I4891 Unspecified atrial fibrillation: Secondary | ICD-10-CM | POA: Diagnosis not present

## 2016-05-06 DIAGNOSIS — I509 Heart failure, unspecified: Secondary | ICD-10-CM | POA: Diagnosis not present

## 2016-05-06 DIAGNOSIS — I110 Hypertensive heart disease with heart failure: Secondary | ICD-10-CM | POA: Diagnosis not present

## 2016-05-06 DIAGNOSIS — G9340 Encephalopathy, unspecified: Secondary | ICD-10-CM | POA: Diagnosis not present

## 2016-05-06 DIAGNOSIS — E119 Type 2 diabetes mellitus without complications: Secondary | ICD-10-CM | POA: Diagnosis not present

## 2016-05-06 DIAGNOSIS — Z950 Presence of cardiac pacemaker: Secondary | ICD-10-CM | POA: Diagnosis not present

## 2016-05-06 DIAGNOSIS — Z7901 Long term (current) use of anticoagulants: Secondary | ICD-10-CM | POA: Diagnosis not present

## 2016-05-11 DIAGNOSIS — Z7901 Long term (current) use of anticoagulants: Secondary | ICD-10-CM | POA: Diagnosis not present

## 2016-05-11 DIAGNOSIS — I110 Hypertensive heart disease with heart failure: Secondary | ICD-10-CM | POA: Diagnosis not present

## 2016-05-11 DIAGNOSIS — I4891 Unspecified atrial fibrillation: Secondary | ICD-10-CM | POA: Diagnosis not present

## 2016-05-11 DIAGNOSIS — R531 Weakness: Secondary | ICD-10-CM | POA: Diagnosis not present

## 2016-05-11 DIAGNOSIS — E119 Type 2 diabetes mellitus without complications: Secondary | ICD-10-CM | POA: Diagnosis not present

## 2016-05-11 DIAGNOSIS — I69398 Other sequelae of cerebral infarction: Secondary | ICD-10-CM | POA: Diagnosis not present

## 2016-05-11 DIAGNOSIS — Z950 Presence of cardiac pacemaker: Secondary | ICD-10-CM | POA: Diagnosis not present

## 2016-05-11 DIAGNOSIS — G9340 Encephalopathy, unspecified: Secondary | ICD-10-CM | POA: Diagnosis not present

## 2016-05-11 DIAGNOSIS — I509 Heart failure, unspecified: Secondary | ICD-10-CM | POA: Diagnosis not present

## 2016-05-13 DIAGNOSIS — G9340 Encephalopathy, unspecified: Secondary | ICD-10-CM | POA: Diagnosis not present

## 2016-05-13 DIAGNOSIS — E119 Type 2 diabetes mellitus without complications: Secondary | ICD-10-CM | POA: Diagnosis not present

## 2016-05-13 DIAGNOSIS — I110 Hypertensive heart disease with heart failure: Secondary | ICD-10-CM | POA: Diagnosis not present

## 2016-05-13 DIAGNOSIS — Z7901 Long term (current) use of anticoagulants: Secondary | ICD-10-CM | POA: Diagnosis not present

## 2016-05-13 DIAGNOSIS — I4891 Unspecified atrial fibrillation: Secondary | ICD-10-CM | POA: Diagnosis not present

## 2016-05-13 DIAGNOSIS — Z950 Presence of cardiac pacemaker: Secondary | ICD-10-CM | POA: Diagnosis not present

## 2016-05-13 DIAGNOSIS — I509 Heart failure, unspecified: Secondary | ICD-10-CM | POA: Diagnosis not present

## 2016-05-13 DIAGNOSIS — R531 Weakness: Secondary | ICD-10-CM | POA: Diagnosis not present

## 2016-05-13 DIAGNOSIS — I69398 Other sequelae of cerebral infarction: Secondary | ICD-10-CM | POA: Diagnosis not present

## 2016-05-16 DIAGNOSIS — H40153 Residual stage of open-angle glaucoma, bilateral: Secondary | ICD-10-CM | POA: Diagnosis not present

## 2016-05-23 DIAGNOSIS — E1142 Type 2 diabetes mellitus with diabetic polyneuropathy: Secondary | ICD-10-CM | POA: Diagnosis not present

## 2016-05-23 DIAGNOSIS — M79674 Pain in right toe(s): Secondary | ICD-10-CM | POA: Diagnosis not present

## 2016-05-23 DIAGNOSIS — B351 Tinea unguium: Secondary | ICD-10-CM | POA: Diagnosis not present

## 2016-05-23 DIAGNOSIS — L851 Acquired keratosis [keratoderma] palmaris et plantaris: Secondary | ICD-10-CM | POA: Diagnosis not present

## 2016-05-23 DIAGNOSIS — L600 Ingrowing nail: Secondary | ICD-10-CM | POA: Diagnosis not present

## 2016-05-27 LAB — CULTURE, BLOOD (ROUTINE X 2)
CULTURE: NO GROWTH
CULTURE: NO GROWTH

## 2016-07-11 DIAGNOSIS — E119 Type 2 diabetes mellitus without complications: Secondary | ICD-10-CM | POA: Diagnosis not present

## 2016-07-18 DIAGNOSIS — I10 Essential (primary) hypertension: Secondary | ICD-10-CM | POA: Diagnosis not present

## 2016-07-18 DIAGNOSIS — L97921 Non-pressure chronic ulcer of unspecified part of left lower leg limited to breakdown of skin: Secondary | ICD-10-CM | POA: Diagnosis not present

## 2016-07-18 DIAGNOSIS — E119 Type 2 diabetes mellitus without complications: Secondary | ICD-10-CM | POA: Diagnosis not present

## 2016-08-01 ENCOUNTER — Emergency Department: Payer: Medicare HMO

## 2016-08-01 ENCOUNTER — Emergency Department
Admission: EM | Admit: 2016-08-01 | Discharge: 2016-08-01 | Disposition: A | Discharge status: Home / Self Care | Payer: Medicare HMO | Attending: Emergency Medicine | Admitting: Emergency Medicine

## 2016-08-01 ENCOUNTER — Encounter: Payer: Self-pay | Admitting: Emergency Medicine

## 2016-08-01 DIAGNOSIS — R609 Edema, unspecified: Secondary | ICD-10-CM

## 2016-08-01 DIAGNOSIS — Z7984 Long term (current) use of oral hypoglycemic drugs: Secondary | ICD-10-CM | POA: Diagnosis not present

## 2016-08-01 DIAGNOSIS — I110 Hypertensive heart disease with heart failure: Secondary | ICD-10-CM | POA: Insufficient documentation

## 2016-08-01 DIAGNOSIS — R600 Localized edema: Secondary | ICD-10-CM | POA: Diagnosis not present

## 2016-08-01 DIAGNOSIS — I10 Essential (primary) hypertension: Secondary | ICD-10-CM | POA: Diagnosis not present

## 2016-08-01 DIAGNOSIS — M7989 Other specified soft tissue disorders: Secondary | ICD-10-CM | POA: Diagnosis present

## 2016-08-01 DIAGNOSIS — Z79899 Other long term (current) drug therapy: Secondary | ICD-10-CM | POA: Insufficient documentation

## 2016-08-01 DIAGNOSIS — E119 Type 2 diabetes mellitus without complications: Secondary | ICD-10-CM | POA: Diagnosis not present

## 2016-08-01 DIAGNOSIS — I509 Heart failure, unspecified: Secondary | ICD-10-CM | POA: Insufficient documentation

## 2016-08-01 DIAGNOSIS — Z87891 Personal history of nicotine dependence: Secondary | ICD-10-CM | POA: Diagnosis not present

## 2016-08-01 LAB — BASIC METABOLIC PANEL
Anion gap: 7 (ref 5–15)
BUN: 28 mg/dL — AB (ref 6–20)
CHLORIDE: 108 mmol/L (ref 101–111)
CO2: 27 mmol/L (ref 22–32)
Calcium: 9.1 mg/dL (ref 8.9–10.3)
Creatinine, Ser: 1 mg/dL (ref 0.44–1.00)
GFR calc Af Amer: 59 mL/min — ABNORMAL LOW (ref >60)
GFR calc non Af Amer: 51 mL/min — ABNORMAL LOW (ref >60)
GLUCOSE: 126 mg/dL — AB (ref 65–99)
POTASSIUM: 3.6 mmol/L (ref 3.5–5.1)
Sodium: 142 mmol/L (ref 135–145)

## 2016-08-01 LAB — CBC
HEMATOCRIT: 38.1 % (ref 35.0–47.0)
Hemoglobin: 12.7 g/dL (ref 12.0–16.0)
MCH: 27.9 pg (ref 26.0–34.0)
MCHC: 33.3 g/dL (ref 32.0–36.0)
MCV: 83.8 fL (ref 80.0–100.0)
Platelets: 212 10*3/uL (ref 150–440)
RBC: 4.55 MIL/uL (ref 3.80–5.20)
RDW: 14.3 % (ref 11.5–14.5)
WBC: 3.6 10*3/uL (ref 3.6–11.0)

## 2016-08-01 LAB — TROPONIN I: Troponin I: <0.03 ng/mL (ref <0.03)

## 2016-08-01 LAB — BRAIN NATRIURETIC PEPTIDE: B Natriuretic Peptide: 1394 pg/mL — ABNORMAL HIGH (ref 0.0–100.0)

## 2016-08-01 MED ORDER — SILVER SULFADIAZINE 1 % EX CREA
TOPICAL_CREAM | CUTANEOUS | Status: AC
  Filled 2016-08-01: qty 85

## 2016-08-01 NOTE — Discharge Instructions (Signed)
Please either return to the emergency room or coronal clinic tomorrow for dressing change. Please watch carefully how the dressing is applied and then the day actually probably will be able to change her dressing herself. We'll heavy change dressing every day for the next few days till healing begins. Please return for any increased swelling redness fever or increased pain. Please keep her legs elevated over the level of your heart if you're sitting down. Do not let her legs dangle down.

## 2016-08-01 NOTE — ED Triage Notes (Signed)
States has had bilateral leg edema x 2 weeks. States yesterday took compression socks off and began having blisters and weeping. States has had history of leg edema prior also.

## 2016-08-01 NOTE — ED Provider Notes (Signed)
[facility] Emergency Department Provider Note   ____________________________________________   First MD Initiated Contact with Patient 08/01/16 1521     (approximate)  I have reviewed the triage vital signs and the nursing notes.   HISTORY  Chief Complaint Leg Swelling    HPI Tamara Campbell is a 83 y.o. female who reports she's been having leg swelling for 2 weeks. She took off her compression stockings because they were hurting her and has not been wearing them. She put on some type Price and cream last night and then this morning woke up with large blisters on the anterior surfaces of both legs the left leg has a blister about the size, for hand with a area of skin breakdown next to it. The right leg has a blister 6-7 times as day anteriorly that is draining. Patient's daughter says she has been told that the area of skin breakdown which is recurrent because the patient has poor arterial flow to the legs. Patient has never had blisters like this before. Patient has not used this type top Price and cream for a while and then started back using again.   Past Medical History:  Diagnosis Date  . CHF (congestive heart failure) (HCC)   . Diabetes mellitus without complication (HCC)   . Hypertension   . Pacemaker   . Stroke [facility])     Patient Active Problem List   Diagnosis Date Noted  . Acute encephalopathy 04/26/2016  . Embolic stroke (HCC) 03/16/2015  . Risk for falls 03/16/2015  . Essential hypertension 01/13/2015  . Hyperlipidemia LDL goal <70 01/13/2015  . Diabetes type 2, controlled (HCC) 01/13/2015  . Paroxysmal ventricular tachycardia (HCC) 01/13/2015  . Hypokalemia 01/13/2015  . Open wound of left lower leg 01/13/2015  . Cerebral infarction due to embolism of middle cerebral artery (HCC)   . Atrial fibrillation (HCC) 01/10/2015  . Cardiomyopathy (HCC) 01/10/2015  . Cerebral infarction (HCC) 01/08/2015    Past Surgical History:  Procedure  Laterality Date  . ABDOMINAL HYSTERECTOMY    . JOINT REPLACEMENT    . PACEMAKER PLACEMENT      Prior to Admission medications   Medication Sig Start Date End Date Taking? Authorizing Provider  acetaminophen (TYLENOL) 500 MG tablet Take 500 mg by mouth every 4 (four) hours as needed for mild pain or fever.    Historical Provider, MD  amLODipine-benazepril (LOTREL) 10-20 MG capsule Take 1 capsule by mouth daily.    Historical Provider, MD  apixaban (ELIQUIS) 5 MG TABS tablet Take 1 tablet (5 mg total) by mouth 2 (two) times daily. 01/13/15   Layne Benton, NP  atorvastatin (LIPITOR) 20 MG tablet Take 1 tablet (20 mg total) by mouth daily at 6 PM. 01/13/15   Layne Benton, NP  bimatoprost (LUMIGAN) 0.01 % SOLN Place 1 drop into both eyes at bedtime.    Historical Provider, MD  carvedilol (COREG) 6.25 MG tablet Take 1 tablet (6.25 mg total) by mouth 2 (two) times daily with a meal. 01/13/15   Layne Benton, NP  cholecalciferol (VITAMIN D) 1000 UNITS tablet Take 2,000 Units by mouth daily.    Historical Provider, MD  hydrochlorothiazide (HYDRODIURIL) 25 MG tablet Take 25 mg by mouth daily.    Historical Provider, MD  metFORMIN (GLUCOPHAGE) 500 MG tablet Take 500 mg by mouth 2 (two) times daily with a meal.    Historical Provider, MD  Multiple Vitamin (MULTIVITAMIN WITH MINERALS) TABS tablet Take 1 tablet by  mouth daily.    Historical Provider, MD  neomycin-bacitracin-polymyxin (NEOSPORIN) OINT Apply 1 application topically daily. Apply Neosporin to left leg wound Q day, then cover with foam dressing.  Change foam dressing Q 5 days or PRN soiling. Patient not taking: Reported on 04/25/2016 01/13/15   Layne BentonSharon L Biby, NP  potassium chloride (K-DUR,KLOR-CON) 10 MEQ tablet Take 10 mEq by mouth daily.    Historical Provider, MD    Allergies Patient has no known allergies.  Family History  Problem Relation Age of Onset  . Stroke Sister     Social History Social History  Substance Use Topics  . Smoking  status: Former Games developermoker  . Smokeless tobacco: Never Used  . Alcohol use No    Review of Systems Constitutional: No fever/chills Eyes: No visual changes. ENT: No sore throat. Cardiovascular: Denies chest pain. Respiratory: Denies shortness of breath. Gastrointestinal: No abdominal pain.  No nausea, no vomiting.  No diarrhea.  No constipation. Genitourinary: Negative for dysuria. Musculoskeletal: Negative for back pain. Skin: Negative for rash.  10-point ROS otherwise negative.  ____________________________________________   PHYSICAL EXAM:  VITAL SIGNS: ED Triage Vitals  Enc Vitals Group     BP 08/01/16 1456 (!) 166/80     Pulse Rate 08/01/16 1456 71     Resp 08/01/16 1456 20     Temp 08/01/16 1456 98.9 F (37.2 C)     Temp Source 08/01/16 1456 Oral     SpO2 08/01/16 1456 97 %     Weight 08/01/16 1500 128 lb (58.1 kg)     Height 08/01/16 1500 5\' 2"  (1.575 m)     Head Circumference --      Peak Flow --      Pain Score 08/01/16 1500 10     Pain Loc --      Pain Edu? --      Excl. in GC? --    Constitutional: Alert and oriented. Well appearing and in no acute distress. Eyes: Conjunctivae are normal. PERRL. EOMI. Head: Atraumatic. Nose: No congestion/rhinnorhea. Mouth/Throat: Mucous membranes are moist.  Oropharynx non-erythematous. Neck: No stridor.   Cardiovascular: Normal rate, regular rhythm. Grossly normal heart sounds.  Good peripheral circulation. Respiratory: Normal respiratory effort.  No retractions. Lungs CTAB. Gastrointestinal: Soft and nontender. No distention. No abdominal bruits. No CVA tenderness. Musculoskeletal: Patient has some mild edema to both legs below the knee. Patient's legs are not particularly red over the soles of feet are somewhat red. The blisters are present, left one has not drained the right one has drained they do not look infected at the present time.   ____________________________________________   LABS (all labs ordered are  listed, but only abnormal results are displayed)  Labs Reviewed  BASIC METABOLIC PANEL - Abnormal; Notable for the following:       Result Value   Glucose, Bld 126 (*)    BUN 28 (*)    GFR calc non Af Amer 51 (*)    GFR calc Af Amer 59 (*)    All other components within normal limits  BRAIN NATRIURETIC PEPTIDE - Abnormal; Notable for the following:    B Natriuretic Peptide 1,394.0 (*)    All other components within normal limits  CBC  TROPONIN I   ____________________________________________  EKG  EKG shows a fully paced rhythm. ____________________________________________  RADIOLOGY  Chest x-ray read by me shows enlarged heart but no sign of any lung problems.  Study Result   CLINICAL DATA:  Bilateral lower extremity edema.  EXAM: PORTABLE CHEST 1 VIEW  COMPARISON:  01/09/2015  FINDINGS: The heart is enlarged but stable. Stable tortuosity, ectasia and calcification of the thoracic aorta. The lungs are clear. No findings for CHF. No pleural effusions. The bony thorax is intact.  IMPRESSION: Stable cardiac enlargement and tortuous calcified thoracic aorta.  No acute pulmonary findings.   Electronically Signed   By: Rudie Meyer M.D.   On: 08/01/2016 15:41    ____________________________________________   PROCEDURES  Procedure(s) performed:  Procedures  Critical Care performed:   ____________________________________________   INITIAL IMPRESSION / ASSESSMENT AND PLAN / ED COURSE  Pertinent labs & imaging results that were available during my care of the patient were reviewed by me and considered in my medical decision making (see chart for details).       ____________________________________________   FINAL CLINICAL IMPRESSION(S) / ED DIAGNOSES  Final diagnoses:  Peripheral edema   Additional diagnosis large blisters on both legs.   NEW MEDICATIONS STARTED DURING THIS VISIT:  New Prescriptions   No medications on file      Note:  This document was prepared using Dragon voice recognition software and may include unintentional dictation errors.    Arnaldo Natal, MD 08/01/16 [phone]

## 2016-08-02 DIAGNOSIS — L97821 Non-pressure chronic ulcer of other part of left lower leg limited to breakdown of skin: Secondary | ICD-10-CM | POA: Diagnosis not present

## 2016-08-02 DIAGNOSIS — I83028 Varicose veins of left lower extremity with ulcer other part of lower leg: Secondary | ICD-10-CM | POA: Diagnosis not present

## 2016-08-08 ENCOUNTER — Encounter: Payer: Medicare HMO | Attending: Surgery | Admitting: Surgery

## 2016-08-08 DIAGNOSIS — I110 Hypertensive heart disease with heart failure: Secondary | ICD-10-CM | POA: Insufficient documentation

## 2016-08-08 DIAGNOSIS — Z87891 Personal history of nicotine dependence: Secondary | ICD-10-CM | POA: Diagnosis not present

## 2016-08-08 DIAGNOSIS — I429 Cardiomyopathy, unspecified: Secondary | ICD-10-CM | POA: Diagnosis not present

## 2016-08-08 DIAGNOSIS — E11622 Type 2 diabetes mellitus with other skin ulcer: Secondary | ICD-10-CM | POA: Diagnosis not present

## 2016-08-08 DIAGNOSIS — Z7901 Long term (current) use of anticoagulants: Secondary | ICD-10-CM | POA: Diagnosis not present

## 2016-08-08 DIAGNOSIS — M1990 Unspecified osteoarthritis, unspecified site: Secondary | ICD-10-CM | POA: Diagnosis not present

## 2016-08-08 DIAGNOSIS — Z950 Presence of cardiac pacemaker: Secondary | ICD-10-CM | POA: Diagnosis not present

## 2016-08-08 DIAGNOSIS — L97811 Non-pressure chronic ulcer of other part of right lower leg limited to breakdown of skin: Secondary | ICD-10-CM | POA: Diagnosis not present

## 2016-08-08 DIAGNOSIS — L97821 Non-pressure chronic ulcer of other part of left lower leg limited to breakdown of skin: Secondary | ICD-10-CM | POA: Diagnosis not present

## 2016-08-08 DIAGNOSIS — E119 Type 2 diabetes mellitus without complications: Secondary | ICD-10-CM | POA: Diagnosis not present

## 2016-08-08 DIAGNOSIS — I4891 Unspecified atrial fibrillation: Secondary | ICD-10-CM | POA: Diagnosis not present

## 2016-08-08 DIAGNOSIS — Z79899 Other long term (current) drug therapy: Secondary | ICD-10-CM | POA: Insufficient documentation

## 2016-08-08 DIAGNOSIS — Z8673 Personal history of transient ischemic attack (TIA), and cerebral infarction without residual deficits: Secondary | ICD-10-CM | POA: Diagnosis not present

## 2016-08-08 DIAGNOSIS — I509 Heart failure, unspecified: Secondary | ICD-10-CM | POA: Diagnosis not present

## 2016-08-08 DIAGNOSIS — I890 Lymphedema, not elsewhere classified: Secondary | ICD-10-CM | POA: Diagnosis not present

## 2016-08-08 DIAGNOSIS — L97221 Non-pressure chronic ulcer of left calf limited to breakdown of skin: Secondary | ICD-10-CM | POA: Insufficient documentation

## 2016-08-08 DIAGNOSIS — L97211 Non-pressure chronic ulcer of right calf limited to breakdown of skin: Secondary | ICD-10-CM | POA: Insufficient documentation

## 2016-08-08 DIAGNOSIS — E785 Hyperlipidemia, unspecified: Secondary | ICD-10-CM | POA: Diagnosis not present

## 2016-08-08 NOTE — Progress Notes (Signed)
JIANNI, SHELDEN ([id number]) Visit Report for 08/08/2016 Chief Complaint Document Details Patient Name: Tamara, Campbell. Date of Service: 08/08/2016 9:45 AM Medical Record Number: [id number] Patient Account Number: [id number] Date of Birth/Sex: [birth year] (83 y.o. Female) Treating RN: Ashok Cordia, Debi Primary Tamara Campbell Provider: Jodi Mourning Other Clinician: Referring Provider: Jodi Mourning Treating Provider/Extender: Rudene Re in Treatment: 0 Information Obtained from: Patient Chief Complaint Patient presents to the wound Tamara Campbell center for a consult due non healing wound both lower extremities with swelling for the last 2 weeks Electronic Signature(s) Signed: 08/08/2016 11:06:42 AM By: Evlyn Kanner MD, FACS Entered By: Evlyn Kanner on 08/08/2016 11:06:42 Tamara Campbell ([id number]) -------------------------------------------------------------------------------- HPI Details Patient Name: Tamara Campbell. Date of Service: 08/08/2016 9:45 AM Medical Record Number: [id number] Patient Account Number: [id number] Date of Birth/Sex: 06-17-1930 (83 y.o. Female) Treating RN: Ashok Cordia, Debi Primary Tamara Campbell Provider: Jodi Mourning Other Clinician: Referring Provider: Jodi Mourning Treating Provider/Extender: Rudene Re in Treatment: 0 History of Present Illness Location: both lower extremities Quality: Patient reports No Pain. Severity: Patient states wound (s) are getting better. Duration: Patient has had the wound for < 2 weeks prior to presenting for treatment Context: The wound appeared gradually over time Modifying Factors: Other treatment(s) tried include:Silvadene cream and compression wraps Associated Signs and Symptoms: Patient reports having increase swelling. HPI Description: 83 year old patient who has been sent to see [place] by her PCP Dr. Yates Decamp, for open wounds on her left lower extremity, limited to breakdown of skin, possibly due to venous stasis ulcer,  has a past medical history of diabetes mellitus type 2, status post cardiac pacemaker, essential hypertension, atrial fibrillation, CHF, cardiomyopathy, ventricular tachycardia, status post right total knee arthroplasty in 2014, back surgery in 1996, hysterectomy. She has never been a smoker. her PCP found that she had stasis edema of both lower extremities left worse than right and had ulceration on the lateral aspect of her left leg. Last hemoglobin A1c done and February of this year was 6.3% patient's daughter tells me that she has seen at [facility] for an opinion about a year ago and hence we will try and obtain these reports regarding the workup done and the recommendations made Electronic Signature(s) Signed: 08/08/2016 11:07:45 AM By: Evlyn Kanner MD, FACS Previous Signature: 08/08/2016 10:19:23 AM Version By: Evlyn Kanner MD, FACS Previous Signature: 08/08/2016 10:15:17 AM Version By: Evlyn Kanner MD, FACS Entered By: Evlyn Kanner on 08/08/2016 11:07:44 Tamara Campbell ([id number]) -------------------------------------------------------------------------------- Physical Exam Details Patient Name: Tamara Campbell. Date of Service: 08/08/2016 9:45 AM Medical Record Number: [id number] Patient Account Number: [id number] Date of Birth/Sex: [birth year] (83 y.o. Female) Treating RN: Ashok Cordia, Debi Primary Tamara Campbell Provider: Jodi Mourning Other Clinician: Referring Provider: Jodi Mourning Treating Provider/Extender: Rudene Re in Treatment: 0 Constitutional . Pulse regular. Respirations normal and unlabored. Afebrile. . Eyes Nonicteric. Reactive to light. Ears, Nose, Mouth, and Throat Lips, teeth, and gums WNL.Marland Kitchen Moist mucosa without lesions. Neck supple and nontender. No palpable supraclavicular or cervical adenopathy. Normal sized without goiter. Respiratory WNL. No retractions.. Cardiovascular Pedal Pulses WNL. the left ankle ABI is 1.09. the right side  was noncompressible.. minimal lymphedema bilaterally stage I. Chest Breasts symmetical and no nipple discharge.. Breast tissue WNL, no masses, lumps, or tenderness.. Lymphatic No adneopathy. No adenopathy. No adenopathy. Musculoskeletal Adexa without tenderness or enlargement.. Digits and nails w/o clubbing, cyanosis, infection, petechiae, ischemia, or inflammatory conditions.. Integumentary (Hair, Skin) No suspicious lesions. No crepitus or  fluctuance. No peri-wound warmth or erythema. No masses.Marland Kitchen Psychiatric Judgement and insight Intact.. No evidence of depression, anxiety, or agitation.. Notes the patient has evidence of blisters both lower extremities on the anterior lateral calf the right side still being intact and the left side has peeled out to expose superficial breakdown of skin. No sharp debridement was required today Electronic Signature(s) Signed: 08/08/2016 11:08:55 AM By: Evlyn Kanner MD, FACS Entered By: Evlyn Kanner on 08/08/2016 11:08:55 Tamara Campbell ([id number]) -------------------------------------------------------------------------------- Physician Orders Details Patient Name: Tamara Campbell Date of Service: 08/08/2016 9:45 AM Medical Record Number: [id number] Patient Account Number: [id number] Date of Birth/Sex: 1931-01-11 (83 y.o. Female) Treating RN: Ashok Cordia, Debi Primary Tamara Campbell Provider: Jodi Mourning Other Clinician: Referring Provider: Jodi Mourning Treating Provider/Extender: Rudene Re in Treatment: 0 Verbal / Phone Orders: Yes Clinician: Pinkerton, Debi Read Back and Verified: Yes Diagnosis Coding Wound Cleansing Wound #1 Left,Anterior Lower Leg o Clean wound with Normal Saline. o May Shower, gently pat wound dry prior to applying new dressing. Wound #2 Right,Anterior Lower Leg o Clean wound with Normal Saline. o May Shower, gently pat wound dry prior to applying new dressing. Anesthetic Wound #1 Left,Anterior Lower  Leg o Topical Lidocaine 4% cream applied to wound bed prior to debridement - clinic use only Wound #2 Right,Anterior Lower Leg o Topical Lidocaine 4% cream applied to wound bed prior to debridement - clinic use only Skin Barriers/Peri-Wound Tamara Campbell Wound #1 Left,Anterior Lower Leg o Moisturizing lotion Wound #2 Right,Anterior Lower Leg o Moisturizing lotion Primary Wound Dressing Wound #1 Left,Anterior Lower Leg o Aquacel Ag Wound #2 Right,Anterior Lower Leg o Aquacel Ag Secondary Dressing Wound #1 Left,Anterior Lower Leg o ABD pad o Drawtex Wound #2 Right,Anterior Lower Leg Tamara Campbell, Tamara C. ([id number]) o ABD pad o Drawtex Dressing Change Frequency Wound #1 Left,Anterior Lower Leg o Change dressing every week Wound #2 Right,Anterior Lower Leg o Change dressing every week Follow-up Appointments Wound #1 Left,Anterior Lower Leg o Return Appointment in 1 week. Wound #2 Right,Anterior Lower Leg o Return Appointment in 1 week. Edema Control Wound #1 Left,Anterior Lower Leg o Kerlix and Coban - Bilateral - unna to anchor o Elevate legs to the level of the heart and pump ankles as often as possible Wound #2 Right,Anterior Lower Leg o Kerlix and Coban - Bilateral - unna to anchor o Elevate legs to the level of the heart and pump ankles as often as possible Additional Orders / Instructions Wound #1 Left,Anterior Lower Leg o Increase protein intake. Wound #2 Right,Anterior Lower Leg o Increase protein intake. Electronic Signature(s) Signed: 08/08/2016 2:21:38 PM By: Evlyn Kanner MD, FACS Signed: 08/08/2016 2:39:38 PM By: Alejandro Mulling Entered By: Alejandro Mulling on 08/08/2016 10:53:05 Tamara Campbell ([id number]) -------------------------------------------------------------------------------- Problem List Details Patient Name: ANTINETTE, KEOUGH. Date of Service: 08/08/2016 9:45 AM Medical Record Number: [id number] Patient Account Number:  [id number] Date of Birth/Sex: [birth year] (83 y.o. Female) Treating RN: Ashok Cordia, Debi Primary Tamara Campbell Provider: Jodi Mourning Other Clinician: Referring Provider: Jodi Mourning Treating Provider/Extender: Rudene Re in Treatment: 0 Active Problems ICD-10 Encounter Code Description Active Date Diagnosis E11.622 Type 2 diabetes mellitus with other skin ulcer 08/08/2016 Yes I89.0 Lymphedema, not elsewhere classified 08/08/2016 Yes L97.221 Non-pressure chronic ulcer of left calf limited to 08/08/2016 Yes breakdown of skin L97.211 Non-pressure chronic ulcer of right calf limited to 08/08/2016 Yes breakdown of skin Inactive Problems Resolved Problems Electronic Signature(s) Signed: 08/08/2016 11:06:01 AM By: Evlyn Kanner MD, FACS Entered By: Evlyn Kanner  on 08/08/2016 11:06:00 Tamara HoitWALKER, Fusako C. ([id number]) -------------------------------------------------------------------------------- Progress Note Details Patient Name: Tamara HoitWALKER, Tamara C. Date of Service: 08/08/2016 9:45 AM Medical Record Number: [id number] Patient Account Number: [id number] Date of Birth/Sex: 04/15/1931 (83 y.o. Female) Treating RN: Ashok CordiaPinkerton, Debi Primary Tamara Campbell Provider: Jodi MourningWalker III, John Other Clinician: Referring Provider: Jodi MourningWalker III, John Treating Provider/Extender: Rudene ReBritto, Curtis Cain Weeks in Treatment: 0 Subjective Chief Complaint Information obtained from Patient Patient presents to the wound Tamara Campbell center for a consult due non healing wound both lower extremities with swelling for the last 2 weeks History of Present Illness (HPI) The following HPI elements were documented for the patient's wound: Location: both lower extremities Quality: Patient reports No Pain. Severity: Patient states wound (s) are getting better. Duration: Patient has had the wound for < 2 weeks prior to presenting for treatment Context: The wound appeared gradually over time Modifying Factors: Other treatment(s) tried include:Silvadene cream  and compression wraps Associated Signs and Symptoms: Patient reports having increase swelling. 83 year old patient who has been sent to see us by her PCP Dr. Yates DecampJohn Trautman, for open wounds on her left lower extremity, limited to breakdown of skin, possibly due to venous stasis ulcer, has a past medical history of diabetes mellitus type 2, status post cardiac pacemaker, essential hypertension, atrial fibrillation, CHF, cardiomyopathy, ventricular tachycardia, status post right total knee arthroplasty in 2014, back surgery in 1996, hysterectomy. She has never been a smoker. her PCP found that she had stasis edema of both lower extremities left worse than right and had ulceration on the lateral aspect of her left leg. Last hemoglobin A1c done and February of this year was 6.3% patient's daughter tells me that she has seen at [facility] for an opinion about a year ago and hence we will try and obtain these reports regarding the workup done and the recommendations made Wound History Patient presents with 2 open wounds that have been present for approximately one week. Patient has been treating wounds in the following manner: wrapping with kerlix and silvadene. Laboratory tests have not been performed in the last month. Patient reportedly has not tested positive for an antibiotic resistant organism. Patient reportedly has not tested positive for osteomyelitis. Patient reportedly has had testing performed to evaluate circulation in the legs. Patient experiences the following problems associated with their wounds: swelling. Patient History Information obtained from Patient. Tamara HoitWALKER, Tamara C. ([id number]) Allergies NKDA Family History Cancer - Mother, Stroke - Child, No family history of Diabetes, Heart Disease, Hereditary Spherocytosis. Social History Former smoker, Marital Status - Widowed, Alcohol Use - Never, Drug Use - No History, Caffeine Use - Never. Medical  History Cardiovascular Patient has history of Arrhythmia - a-fib, Congestive Heart Failure, Hypertension Endocrine Patient has history of Type II Diabetes Musculoskeletal Patient has history of Osteoarthritis Patient is treated with Oral Agents. Blood sugar is not tested. Review of Systems (ROS) Constitutional Symptoms (General Health) The patient has no complaints or symptoms. Eyes Complains or has symptoms of Glasses / Contacts. Ear/Nose/Mouth/Throat The patient has no complaints or symptoms. Hematologic/Lymphatic The patient has no complaints or symptoms. Respiratory The patient has no complaints or symptoms. Cardiovascular bradycardia aortic schlerosis TIA CVA hyperlipidemia pace maker heart block Gastrointestinal The patient has no complaints or symptoms. Genitourinary The patient has no complaints or symptoms. Immunological The patient has no complaints or symptoms. Integumentary (Skin) The patient has no complaints or symptoms. Neurologic The patient has no complaints or symptoms. Oncologic The patient has no complaints or symptoms. Psychiatric The patient has  no complaints or symptoms. Tamara Campbell, Tamara Campbell ([id number]) Medications amlodipine 10 mg-benazepril 20 mg capsule oral capsule oral carvedilol 6.25 mg tablet oral tablet oral acetaminophen 500 mg capsule oral capsule oral metformin 500 mg tablet oral tablet oral atorvastatin 20 mg tablet oral tablet oral Eliquis 5 mg tablet oral tablet oral Lumigan 0.01 % eye drops ophthalmic (eye) drops ophthalmic (eye) Centrum Silver 0.4 mg-300 mcg-250 mcg tablet oral tablet oral hydrochlorothiazide 25 mg tablet oral tablet oral cholecalciferol (vitamin D3) 2,000 unit tablet oral tablet oral Objective Constitutional Pulse regular. Respirations normal and unlabored. Afebrile. Vitals Time Taken: 10:02 AM, Height: 62 in, Source: Stated, Weight: 130 lbs, Source: Measured, BMI: 23.8, Temperature: 97.9 F, Pulse: 72 bpm,  Respiratory Rate: 16 breaths/min, Blood Pressure: 154/81 mmHg. Eyes Nonicteric. Reactive to light. Ears, Nose, Mouth, and Throat Lips, teeth, and gums WNL.Marland Kitchen Moist mucosa without lesions. Neck supple and nontender. No palpable supraclavicular or cervical adenopathy. Normal sized without goiter. Respiratory WNL. No retractions.. Cardiovascular Pedal Pulses WNL. the left ankle ABI is 1.09. the right side was noncompressible.. minimal lymphedema bilaterally stage I. Chest Breasts symmetical and no nipple discharge.. Breast tissue WNL, no masses, lumps, or tenderness.. Lymphatic Tamara Campbell, Tamara C. ([id number]) No adneopathy. No adenopathy. No adenopathy. Musculoskeletal Adexa without tenderness or enlargement.. Digits and nails w/o clubbing, cyanosis, infection, petechiae, ischemia, or inflammatory conditions.Marland Kitchen Psychiatric Judgement and insight Intact.. No evidence of depression, anxiety, or agitation.. General Notes: the patient has evidence of blisters both lower extremities on the anterior lateral calf the right side still being intact and the left side has peeled out to expose superficial breakdown of skin. No sharp debridement was required today Integumentary (Hair, Skin) No suspicious lesions. No crepitus or fluctuance. No peri-wound warmth or erythema. No masses.. Wound #1 status is Open. Original cause of wound was Blister. The wound is located on the Left,Anterior Lower Leg. The wound measures 7.6cm length x 8cm width x 0.1cm depth; 47.752cm^2 area and 4.775cm^3 volume. The wound is limited to skin breakdown. There is no tunneling or undermining noted. There is a large amount of serous drainage noted. The wound margin is distinct with the outline attached to the wound base. There is medium (34-66%) red granulation within the wound bed. There is a medium (34- 66%) amount of necrotic tissue within the wound bed including Eschar and Adherent Slough. Periwound temperature was noted as  No Abnormality. The periwound has tenderness on palpation. Wound #2 status is Open. Original cause of wound was Blister. The wound is located on the Right,Anterior Lower Leg. The wound measures 13cm length x 18cm width x 0.1cm depth; 183.783cm^2 area and 18.378cm^3 volume. The wound is limited to skin breakdown. There is no tunneling or undermining noted. There is a large amount of serous drainage noted. The wound margin is distinct with the outline attached to the wound base. There is no granulation within the wound bed. There is a large (67-100%) amount of necrotic tissue within the wound bed including Eschar. Periwound temperature was noted as No Abnormality. The periwound has tenderness on palpation. Assessment Active Problems ICD-10 E11.622 - Type 2 diabetes mellitus with other skin ulcer I89.0 - Lymphedema, not elsewhere classified L97.221 - Non-pressure chronic ulcer of left calf limited to breakdown of skin L97.211 - Non-pressure chronic ulcer of right calf limited to breakdown of skin Tamara Campbell, Tamara Campbell ([id number]) 83 year old diabetic who has good control of her diabetes mellitus and has had CHF and lymphedema of both lower extremities and I understand has had a  workup done at [facility] in the past. After review of her have recommended: 1. Silver alginate with a Kerlix and Coban to be applied to both lower extremities and keep it unchanged for the week. 2. elevation and exercise discussed with her in detail. 3. I was for the reports from [facility] 4. Regular visits the wound center The patient's daughter and daughter-in-law both at the bedside have read all questions answered Plan Wound Cleansing: Wound #1 Left,Anterior Lower Leg: Clean wound with Normal Saline. May Shower, gently pat wound dry prior to applying new dressing. Wound #2 Right,Anterior Lower Leg: Clean wound with Normal Saline. May Shower, gently pat wound dry prior to applying  new dressing. Anesthetic: Wound #1 Left,Anterior Lower Leg: Topical Lidocaine 4% cream applied to wound bed prior to debridement - clinic use only Wound #2 Right,Anterior Lower Leg: Topical Lidocaine 4% cream applied to wound bed prior to debridement - clinic use only Skin Barriers/Peri-Wound Tamara Campbell: Wound #1 Left,Anterior Lower Leg: Moisturizing lotion Wound #2 Right,Anterior Lower Leg: Moisturizing lotion Primary Wound Dressing: Wound #1 Left,Anterior Lower Leg: Aquacel Ag Wound #2 Right,Anterior Lower Leg: Aquacel Ag Secondary Dressing: Wound #1 Left,Anterior Lower Leg: ABD pad Drawtex Wound #2 Right,Anterior Lower Leg: ABD pad Drawtex Dressing Change Frequency: Wound #1 Left,Anterior Lower Leg: Change dressing every week Wound #2 Right,Anterior Lower Leg: Change dressing every week Tamara Campbell, Tamara Campbell ([id number]) Follow-up Appointments: Wound #1 Left,Anterior Lower Leg: Return Appointment in 1 week. Wound #2 Right,Anterior Lower Leg: Return Appointment in 1 week. Edema Control: Wound #1 Left,Anterior Lower Leg: Kerlix and Coban - Bilateral - unna to anchor Elevate legs to the level of the heart and pump ankles as often as possible Wound #2 Right,Anterior Lower Leg: Kerlix and Coban - Bilateral - unna to anchor Elevate legs to the level of the heart and pump ankles as often as possible Additional Orders / Instructions: Wound #1 Left,Anterior Lower Leg: Increase protein intake. Wound #2 Right,Anterior Lower Leg: Increase protein intake. 83 year old diabetic who has good control of her diabetes mellitus and has had CHF and lymphedema of both lower extremities and I understand has had a workup done at [facility] in the past. After review of her have recommended: 1. Silver alginate with a Kerlix and Coban to be applied to both lower extremities and keep it unchanged for the week. 2. elevation and exercise discussed with her in detail. 3. I was for the  reports from [facility] 4. Regular visits the wound center The patient's daughter and daughter-in-law both at the bedside have read all questions answered Electronic Signature(s) Signed: 08/08/2016 11:13:37 AM By: Evlyn Kanner MD, FACS Entered By: Evlyn Kanner on 08/08/2016 11:13:37 Tamara Campbell ([id number]) -------------------------------------------------------------------------------- ROS/PFSH Details Patient Name: Tamara Campbell. Date of Service: 08/08/2016 9:45 AM Medical Record Number: [id number] Patient Account Number: [id number] Date of Birth/Sex: [birth year] (83 y.o. Female) Treating RN: Ashok Cordia, Debi Primary Tamara Campbell Provider: Jodi Mourning Other Clinician: Referring Provider: Jodi Mourning Treating Provider/Extender: Rudene Re in Treatment: 0 Information Obtained From Patient Wound History Do you currently have one or more open woundso Yes How many open wounds do you currently haveo 2 Approximately how long have you had your woundso one week How have you been treating your wound(s) until nowo wrapping with kerlix and silvadene Has your wound(s) ever healed and then re-openedo No Have you had any lab work done in the past montho No Have you tested positive for  an antibiotic resistant organism No (MRSA, VRE)o Have you tested positive for osteomyelitis (bone infection)o No Have you had any tests for circulation on your legso Yes Who ordered the testo dr. Gilda Crease [facility] Have you had other problems associated with your woundso Swelling Eyes Complaints and Symptoms: Positive for: Glasses / Contacts Constitutional Symptoms (General Health) Complaints and Symptoms: No Complaints or Symptoms Ear/Nose/Mouth/Throat Complaints and Symptoms: No Complaints or Symptoms Hematologic/Lymphatic Complaints and Symptoms: No Complaints or Symptoms Respiratory Tamara Campbell, Tamara C. ([id number]) Complaints and Symptoms: No Complaints  or Symptoms Cardiovascular Complaints and Symptoms: Review of System Notes: bradycardia aortic schlerosis TIA CVA hyperlipidemia pace maker heart block Medical History: Positive for: Arrhythmia - a-fib; Congestive Heart Failure; Hypertension Gastrointestinal Complaints and Symptoms: No Complaints or Symptoms Endocrine Medical History: Positive for: Type II Diabetes Time with diabetes: long time per pt Treated with: Oral agents Blood sugar tested every day: No Genitourinary Complaints and Symptoms: No Complaints or Symptoms Immunological Complaints and Symptoms: No Complaints or Symptoms Integumentary (Skin) Complaints and Symptoms: No Complaints or Symptoms Musculoskeletal Medical History: Positive for: Osteoarthritis Tamara Campbell, Tamara Campbell ([id number]) Neurologic Complaints and Symptoms: No Complaints or Symptoms Oncologic Complaints and Symptoms: No Complaints or Symptoms Psychiatric Complaints and Symptoms: No Complaints or Symptoms Immunizations Pneumococcal Vaccine: Received Pneumococcal Vaccination: No Family and Social History Cancer: Yes - Mother; Diabetes: No; Heart Disease: No; Hereditary Spherocytosis: No; Stroke: Yes - Child; Former smoker; Marital Status - Widowed; Alcohol Use: Never; Drug Use: No History; Caffeine Use: Never; Financial Concerns: No; Food, Clothing or Shelter Needs: No; Support System Lacking: No; Transportation Concerns: No; Advanced Directives: No; Patient does not want information on Advanced Directives; Do not resuscitate: No; Living Will: No; Medical Power of Attorney: No Physician Affirmation I have reviewed and agree with the above information. Electronic Signature(s) Signed: 08/08/2016 2:21:38 PM By: Evlyn Kanner MD, FACS Signed: 08/08/2016 2:39:38 PM By: Alejandro Mulling Entered By: Evlyn Kanner on 08/08/2016 10:28:23 Tamara Campbell  ([id number]) -------------------------------------------------------------------------------- SuperBill Details Patient Name: Tamara Campbell. Date of Service: 08/08/2016 Medical Record Number: [id number] Patient Account Number: [id number] Date of Birth/Sex: 04/13/1931 (83 y.o. Female) Treating RN: Ashok Cordia, Debi Primary Tamara Campbell Provider: Jodi Mourning Other Clinician: Referring Provider: Jodi Mourning Treating Provider/Extender: Evlyn Kanner Service Line: Outpatient Weeks in Treatment: 0 Diagnosis Coding ICD-10 Codes Code Description E11.622 Type 2 diabetes mellitus with other skin ulcer I89.0 Lymphedema, not elsewhere classified L97.221 Non-pressure chronic ulcer of left calf limited to breakdown of skin L97.211 Non-pressure chronic ulcer of right calf limited to breakdown of skin Facility Procedures CPT4 Code: 24401027 Description: [phone] - WOUND Tamara Campbell VISIT-LEV 5 EST PT Modifier: Quantity: 1 Physician Procedures CPT4 Code Description: 4403474 [id number] - WC PHYS LEVEL 4 - NEW PT ICD-10 Description Diagnosis E11.622 Type 2 diabetes mellitus with other skin ulcer I89.0 Lymphedema, not elsewhere classified L97.221 Non-pressure chronic ulcer of left calf limited to b  L97.211 Non-pressure chronic ulcer of right calf limited to Modifier: reakdown of ski breakdown of sk Quantity: 1 n in Electronic Signature(s) Signed: 08/08/2016 2:21:38 PM By: Evlyn Kanner MD, FACS Signed: 08/08/2016 2:39:38 PM By: Alejandro Mulling Previous Signature: 08/08/2016 11:13:53 AM Version By: Evlyn Kanner MD, FACS Entered By: Alejandro Mulling on 08/08/2016 12:32:21

## 2016-08-08 NOTE — Progress Notes (Signed)
Tamara Campbell, Tamara Campbell ([id number]) Visit Report for 08/08/2016 Allergy List Details Patient Name: Tamara Campbell, DRIVER. Date of Service: 08/08/2016 9:45 AM Medical Record Number: [id number] Patient Account Number: [id number] Date of Birth/Sex: 1931/04/17 (83 y.o. Female) Treating RN: Ashok Cordia, Debi Primary Care Dixie Coppa: Jodi Mourning Other Clinician: Referring Aryonna Gunnerson: Jodi Mourning Treating Cardelia Sassano/Extender: Rudene Re in Treatment: 0 Allergies Active Allergies NKDA Allergy Notes Electronic Signature(s) Signed: 08/08/2016 2:39:38 PM By: Alejandro Mulling Entered By: Alejandro Mulling on 08/08/2016 10:03:19 Tamara Campbell ([id number]) -------------------------------------------------------------------------------- Arrival Information Details Patient Name: Tamara Campbell Date of Service: 08/08/2016 9:45 AM Medical Record Number: [id number] Patient Account Number: [id number] Date of Birth/Sex: [birth year] (83 y.o. Female) Treating RN: Ashok Cordia, Debi Primary Care Taelon Bendorf: Jodi Mourning Other Clinician: Referring Tzvi Economou: Jodi Mourning Treating Rodriques Badie/Extender: Rudene Re in Treatment: 0 Visit Information Patient Arrived: Wheel Chair Arrival Time: 09:58 Accompanied By: daughter in law Transfer Assistance: EasyPivot Patient Lift Patient Identification Verified: Yes Secondary Verification Process Yes Completed: Patient Requires Transmission- No Based Precautions: Patient Has Alerts: Yes Patient Alerts: Patient on Blood Thinner Eliquis DM II R ABI noncompressible Electronic Signature(s) Signed: 08/08/2016 2:39:38 PM By: Alejandro Mulling Entered By: Alejandro Mulling on 08/08/2016 10:26:43 Tamara Campbell ([id number]) -------------------------------------------------------------------------------- Clinic Level of Care Assessment Details Patient Name: Tamara Campbell. Date of Service: 08/08/2016 9:45 AM Medical Record Number: [id number] Patient Account  Number: [id number] Date of Birth/Sex: 06-06-1931 (83 y.o. Female) Treating RN: Ashok Cordia, Debi Primary Care Deoni Cosey: Jodi Mourning Other Clinician: Referring Alee Gressman: Jodi Mourning Treating Lynnie Koehler/Extender: Rudene Re in Treatment: 0 Clinic Level of Care Assessment Items TOOL 2 Quantity Score X - Use when only an EandM is performed on the INITIAL visit 1 0 ASSESSMENTS - Nursing Assessment / Reassessment X - General Physical Exam (combine w/ comprehensive assessment (listed just 1 20 below) when performed on new pt. evals) X - Comprehensive Assessment (HX, ROS, Risk Assessments, Wounds Hx, etc.) 1 25 ASSESSMENTS - Wound and Skin Assessment / Reassessment []  - Simple Wound Assessment / Reassessment - one wound 0 X - Complex Wound Assessment / Reassessment - multiple wounds 2 5 []  - Dermatologic / Skin Assessment (not related to wound area) 0 ASSESSMENTS - Ostomy and/or Continence Assessment and Care []  - Incontinence Assessment and Management 0 []  - Ostomy Care Assessment and Management (repouching, etc.) 0 PROCESS - Coordination of Care []  - Simple Patient / Family Education for ongoing care 0 X - Complex (extensive) Patient / Family Education for ongoing care 1 20 X - Staff obtains Chiropractor, Records, Test Results / Process Orders 1 10 []  - Staff telephones HHA, Nursing Homes / Clarify orders / etc 0 []  - Routine Transfer to another Facility (non-emergent condition) 0 []  - Routine Hospital Admission (non-emergent condition) 0 X - New Admissions / Manufacturing engineer / Ordering NPWT, Apligraf, etc. 1 15 []  - Emergency Hospital Admission (emergent condition) 0 X - Simple Discharge Coordination 1 10 [employer], Chandani C. ([id number]) []  - Complex (extensive) Discharge Coordination 0 PROCESS - Special Needs []  - Pediatric / Minor Patient Management 0 []  - Isolation Patient Management 0 []  - Hearing / Language / Visual special needs 0 []  - Assessment of Community  assistance (transportation, D/C planning, etc.) 0 []  - Additional assistance / Altered mentation 0 []  - Support Surface(s) Assessment (bed, cushion, seat, etc.) 0 INTERVENTIONS - Wound Cleansing / Measurement X - Wound Imaging (photographs - any number of wounds) 1 5 []  - Wound Tracing (instead of photographs)  0 []  - Simple Wound Measurement - one wound 0 X - Complex Wound Measurement - multiple wounds 2 5 []  - Simple Wound Cleansing - one wound 0 X - Complex Wound Cleansing - multiple wounds 2 5 INTERVENTIONS - Wound Dressings []  - Small Wound Dressing one or multiple wounds 0 []  - Medium Wound Dressing one or multiple wounds 0 X - Large Wound Dressing one or multiple wounds 2 20 []  - Application of Medications - injection 0 INTERVENTIONS - Miscellaneous []  - External ear exam 0 []  - Specimen Collection (cultures, biopsies, blood, body fluids, etc.) 0 []  - Specimen(s) / Culture(s) sent or taken to Lab for analysis 0 []  - Patient Transfer (multiple staff / Michiel Sites Lift / Similar devices) 0 []  - Simple Staple / Suture removal (25 or less) 0 []  - Complex Staple / Suture removal (26 or more) 0 Plagge, Kinjal C. ([id number]) []  - Hypo / Hyperglycemic Management (close monitor of Blood Glucose) 0 X - Ankle / Brachial Index (ABI) - do not check if billed separately 1 15 Has the patient been seen at the hospital within the last three years: Yes Total Score: 190 Level Of Care: New/Established - Level 5 Electronic Signature(s) Signed: 08/08/2016 2:39:38 PM By: Alejandro Mulling Entered By: Alejandro Mulling on 08/08/2016 12:31:48 Tamara Campbell ([id number]) -------------------------------------------------------------------------------- Encounter Discharge Information Details Patient Name: Tamara Campbell. Date of Service: 08/08/2016 9:45 AM Medical Record Number: [id number] Patient Account Number: [id number] Date of Birth/Sex: 08/12/1930 (83 y.o. Female) Treating RN: Ashok Cordia, Debi Primary Care  Kacin Dancy: Jodi Mourning Other Clinician: Referring Edman Lipsey: Jodi Mourning Treating Zamyiah Tino/Extender: Rudene Re in Treatment: 0 Encounter Discharge Information Items Discharge Pain Level: 0 Discharge Condition: Stable Ambulatory Status: Wheelchair Discharge Destination: Home Transportation: Private Auto daughter and Accompanied By: daughter in law Schedule Follow-up Appointment: Yes Medication Reconciliation completed and provided to No Patient/Care Tamara Campbell: Provided on Clinical Summary of Care: 08/08/2016 Form Type Recipient Paper Patient AW Electronic Signature(s) Signed: 08/08/2016 11:11:58 AM By: Gwenlyn Perking Entered By: Gwenlyn Perking on 08/08/2016 11:11:58 Tamara Campbell ([id number]) -------------------------------------------------------------------------------- Lower Extremity Assessment Details Patient Name: Tamara Campbell Date of Service: 08/08/2016 9:45 AM Medical Record Number: [id number] Patient Account Number: [id number] Date of Birth/Sex: 11/30/1930 (83 y.o. Female) Treating RN: Ashok Cordia, Debi Primary Care Arkel Cartwright: Jodi Mourning Other Clinician: Referring Retaj Hilbun: Jodi Mourning Treating Cyndel Griffey/Extender: Rudene Re in Treatment: 0 Edema Assessment Assessed: [Left: No] [Right: No] Edema: [Left: Ye] [Right: s] Calf Left: Right: Point of Measurement: 32 cm From Medial Instep 34 cm 35 cm Ankle Left: Right: Point of Measurement: 9 cm From Medial Instep 22.2 cm 22.6 cm Vascular Assessment Pulses: Dorsalis Pedis Palpable: [Left:No] [Right:No] Doppler Audible: [Left:Yes] [Right:Yes] Posterior Tibial Extremity colors, hair growth, and conditions: Extremity Color: [Left:Hyperpigmented] [Right:Hyperpigmented] Hair Growth on Extremity: [Left:No] [Right:No] Temperature of Extremity: [Left:Cool] [Right:Cool] Capillary Refill: [Left:> 3 seconds] [Right:> 3 seconds] Blood Pressure: Brachial: [Left:138] Dorsalis Pedis: 150  [Left:Dorsalis Pedis:] Ankle: Posterior Tibial: 140 [Left:Posterior Tibial: 1.09] Toe Nail Assessment Left: Right: Thick: Yes Yes Discolored: Yes Yes Deformed: Yes Yes Improper Length and Hygiene: Yes Yes Notes JOANMARIE, TSANG. ([id number]) R ABI non compressible Electronic Signature(s) Signed: 08/08/2016 2:39:38 PM By: Alejandro Mulling Entered By: Alejandro Mulling on 08/08/2016 10:29:11 Tamara Campbell ([id number]) -------------------------------------------------------------------------------- Multi Wound Chart Details Patient Name: Tamara Campbell. Date of Service: 08/08/2016 9:45 AM Medical Record Number: [id number] Patient Account Number: [id number] Date of Birth/Sex: 20-Sep-1930 (83 y.o. Female) Treating RN: Phillis Haggis Primary Care  Syndey Jaskolski: Jodi MourningWalker III, John Other Clinician: Referring Stefon Ramthun: Jodi MourningWalker III, John Treating Alice Vitelli/Extender: Rudene ReBritto, Errol Weeks in Treatment: 0 Vital Signs Height(in): 62 Pulse(bpm): 72 Weight(lbs): 130 Blood Pressure 154/81 (mmHg): Body Mass Index(BMI): 24 Temperature(F): 97.9 Respiratory Rate 16 (breaths/min): Photos: [1:No Photos] [2:No Photos] [N/A:N/A] Wound Location: [1:Left Lower Leg - Anterior] [2:Right Lower Leg - Anterior N/A] Wounding Event: [1:Blister] [2:Blister] [N/A:N/A] Primary Etiology: [1:Diabetic Wound/Ulcer of the Lower Extremity] [2:Diabetic Wound/Ulcer of N/A the Lower Extremity] Comorbid History: [1:Arrhythmia, Congestive Heart Failure, Hypertension, Type II Diabetes, Osteoarthritis] [2:Arrhythmia, Congestive N/A Heart Failure, Hypertension, Type II Diabetes, Osteoarthritis] Date Acquired: [1:08/01/2016] [2:08/01/2016] [N/A:N/A] Weeks of Treatment: [1:0] [2:0] [N/A:N/A] Wound Status: [1:Open] [2:Open] [N/A:N/A] Measurements L x W x D 7.6x8x0.1 [2:13x18x0.1] [N/A:N/A] (cm) Area (cm) : [1:47.752] [2:183.783] [N/A:N/A] Volume (cm) : [1:4.775] [2:18.378] [N/A:N/A] Classification: [1:Grade 2] [2:Grade 2]  [N/A:N/A] Exudate Amount: [1:Large] [2:Large] [N/A:N/A] Exudate Type: [1:Serous] [2:Serous] [N/A:N/A] Exudate Color: [1:amber] [2:amber] [N/A:N/A] Wound Margin: [1:Distinct, outline attached] [2:Distinct, outline attached] [N/A:N/A] Granulation Amount: [1:Medium (34-66%)] [2:None Present (0%)] [N/A:N/A] Granulation Quality: [1:Red] [2:N/A] [N/A:N/A] Necrotic Amount: [1:Medium (34-66%)] [2:Large (67-100%)] [N/A:N/A] Necrotic Tissue: [1:Eschar, Adherent Slough] [2:Eschar] [N/A:N/A] Exposed Structures: [1:Fascia: No Fat Layer (Subcutaneous Tissue) Exposed: No Tendon: No Muscle: No] [2:Fascia: No Fat Layer (Subcutaneous Tissue) Exposed: No Tendon: No Muscle: No] [N/A:N/A] Joint: No Joint: No Bone: No Bone: No Limited to Skin Limited to Skin Breakdown Breakdown Epithelialization: None None N/A Periwound Skin Texture: No Abnormalities Noted No Abnormalities Noted N/A Periwound Skin No Abnormalities Noted No Abnormalities Noted N/A Moisture: Periwound Skin Color: No Abnormalities Noted No Abnormalities Noted N/A Temperature: No Abnormality No Abnormality N/A Tenderness on Yes Yes N/A Palpation: Wound Preparation: Ulcer Cleansing: Ulcer Cleansing: N/A Rinsed/Irrigated with Rinsed/Irrigated with Saline Saline Topical Anesthetic Topical Anesthetic Applied: Other: lidocaine Applied: Other: lidocaine 4% 4% Treatment Notes Wound #1 (Left, Anterior Lower Leg) 1. Cleansed with: Clean wound with Normal Saline 2. Anesthetic Topical Lidocaine 4% cream to wound bed prior to debridement 3. Peri-wound Care: Moisturizing lotion 4. Dressing Applied: Aquacel Ag 5. Secondary Dressing Applied ABD Pad 7. Secured with Tape Notes drawtex, kerlix, coban Wound #2 (Right, Anterior Lower Leg) 1. Cleansed with: Clean wound with Normal Saline 2. Anesthetic Topical Lidocaine 4% cream to wound bed prior to debridement 3. Peri-wound Care: Moisturizing lotion 4. Dressing Applied: Aquacel Ag 5.  Secondary Dressing Applied Tamara Campbell, Tamara C. ([id number]) ABD Pad 7. Secured with Tape Notes drawtex, kerlix, coban Electronic Signature(s) Signed: 08/08/2016 11:06:06 AM By: Evlyn KannerBritto, Errol MD, FACS Entered By: Evlyn KannerBritto, Errol on 08/08/2016 11:06:06 Tamara Campbell, Naimah C. ([id number]) -------------------------------------------------------------------------------- Multi-Disciplinary Care Plan Details Patient Name: Tamara Campbell, Tamara C. Date of Service: 08/08/2016 9:45 AM Medical Record Number: [id number] Patient Account Number: [id number] Date of Birth/Sex: 09/17/1930 (83 y.o. Female) Treating RN: Ashok CordiaPinkerton, Debi Primary Care Cristella Stiver: Jodi MourningWalker III, John Other Clinician: Referring Winston Misner: Jodi MourningWalker III, John Treating Marissa Lowrey/Extender: Rudene ReBritto, Errol Weeks in Treatment: 0 Active Inactive ` Abuse / Safety / Falls / Self Care Management Nursing Diagnoses: Potential for falls Goals: Patient will remain injury free Date Initiated: 08/08/2016 Target Resolution Date: 11/12/2016 Goal Status: Active Interventions: Assess fall risk on admission and as needed Assess impairment of mobility on admission and as needed per policy Notes: ` Nutrition Nursing Diagnoses: Imbalanced nutrition Impaired glucose control: actual or potential Goals: Patient/caregiver agrees to and verbalizes understanding of need to use nutritional supplements and/or vitamins as prescribed Date Initiated: 08/08/2016 Target Resolution Date: 12/10/2016 Goal Status: Active Patient/caregiver verbalizes understanding of need to maintain therapeutic glucose control per  primary care physician Date Initiated: 08/08/2016 Target Resolution Date: 12/10/2016 Goal Status: Active Interventions: Assess HgA1c results as ordered upon admission and as needed Assess patient nutrition upon admission and as needed per policy Tamara Campbell, WAYMIRE ([id number]) Notes: ` Orientation to the Wound Care Program Nursing Diagnoses: Knowledge deficit related to the wound  healing center program Goals: Patient/caregiver will verbalize understanding of the Wound Healing Center Program Date Initiated: 08/08/2016 Target Resolution Date: 09/10/2016 Goal Status: Active Interventions: Provide education on orientation to the wound center Notes: ` Pain, Acute or Chronic Nursing Diagnoses: Pain, acute or chronic: actual or potential Potential alteration in comfort, pain Goals: Patient/caregiver will verbalize adequate pain control between visits Date Initiated: 08/08/2016 Target Resolution Date: 12/10/2016 Goal Status: Active Interventions: Complete pain assessment as per visit requirements Notes: ` Wound/Skin Impairment Nursing Diagnoses: Impaired tissue integrity Knowledge deficit related to ulceration/compromised skin integrity Goals: Ulcer/skin breakdown will have a volume reduction of 80% by week 12 Date Initiated: 08/08/2016 Target Resolution Date: 12/10/2016 Goal Status: Active Tamara Campbell, Tamara Campbell ([id number]) Interventions: Assess patient/caregiver ability to perform ulcer/skin care regimen upon admission and as needed Assess ulceration(s) every visit Notes: Electronic Signature(s) Signed: 08/08/2016 2:39:38 PM By: Alejandro Mulling Entered By: Alejandro Mulling on 08/08/2016 10:50:05 Tamara Campbell ([id number]) -------------------------------------------------------------------------------- Pain Assessment Details Patient Name: Tamara Campbell Date of Service: 08/08/2016 9:45 AM Medical Record Number: [id number] Patient Account Number: [id number] Date of Birth/Sex: [birth year] (83 y.o. Female) Treating RN: Ashok Cordia, Debi Primary Care Nechemia Chiappetta: Jodi Mourning Other Clinician: Referring Keiara Sneeringer: Jodi Mourning Treating Arabel Barcenas/Extender: Rudene Re in Treatment: 0 Active Problems Location of Pain Severity and Description of Pain Patient Has Paino No Site Locations With Dressing Change: No Pain Management and Medication Current Pain  Management: Electronic Signature(s) Signed: 08/08/2016 2:39:38 PM By: Alejandro Mulling Entered By: Alejandro Mulling on 08/08/2016 10:01:51 Tamara Campbell ([id number]) -------------------------------------------------------------------------------- Patient/Caregiver Education Details Patient Name: Tamara Campbell Date of Service: 08/08/2016 9:45 AM Medical Record Number: [id number] Patient Account Number: [id number] Date of Birth/Gender: 04-24-1931 (83 y.o. Female) Treating RN: Ashok Cordia, Debi Primary Care Physician: Jodi Mourning Other Clinician: Referring Physician: Jodi Mourning Treating Physician/Extender: Rudene Re in Treatment: 0 Education Assessment Education Provided To: Patient Education Topics Provided Welcome To The Wound Care Center: Handouts: Welcome To The Wound [facility] Center Methods: Explain/Verbal Responses: State content correctly Wound/Skin Impairment: Handouts: Other: do not get wraps wet Methods: Demonstration, Explain/Verbal Responses: State content correctly Electronic Signature(s) Signed: 08/08/2016 2:39:38 PM By: Alejandro Mulling Entered By: Alejandro Mulling on 08/08/2016 11:07:26 Tamara Campbell ([id number]) -------------------------------------------------------------------------------- Wound Assessment Details Patient Name: Tamara Campbell. Date of Service: 08/08/2016 9:45 AM Medical Record Number: [id number] Patient Account Number: [id number] Date of Birth/Sex: 11-Mar-1931 (83 y.o. Female) Treating RN: Ashok Cordia, Debi Primary Care Cruise Baumgardner: Jodi Mourning Other Clinician: Referring Alys Dulak: Jodi Mourning Treating Ammiel Guiney/Extender: Rudene Re in Treatment: 0 Wound Status Wound Number: 1 Primary Diabetic Wound/Ulcer of the Lower Etiology: Extremity Wound Location: Left Lower Leg - Anterior Wound Open Wounding Event: Blister Status: Date Acquired: 08/01/2016 Comorbid Arrhythmia, Congestive Heart Failure, Weeks Of  Treatment: 0 History: Hypertension, Type II Diabetes, Clustered Wound: No Osteoarthritis Photos Photo Uploaded By: Alejandro Mulling on 08/08/2016 12:47:25 Wound Measurements Length: (cm) 7.6 Width: (cm) 8 Depth: (cm) 0.1 Area: (cm) 47.752 Volume: (cm) 4.775 % Reduction in Area: % Reduction in Volume: Epithelialization: None Tunneling: No Undermining: No Wound Description Classification: Grade 2 Foul Odor Aft Wound Margin: Distinct, outline attached Slough/Fibrin Exudate Amount: Large Exudate  Type: Serous Exudate Color: amber er Cleansing: No o No Wound Bed Granulation Amount: Medium (34-66%) Exposed Structure Granulation Quality: Red Fascia Exposed: No Necrotic Amount: Medium (34-66%) Fat Layer (Subcutaneous Tissue) Exposed: No Necrotic Quality: Eschar, Adherent Slough Tendon Exposed: No Tamara Campbell, Tamara C. ([id number]) Muscle Exposed: No Joint Exposed: No Bone Exposed: No Limited to Skin Breakdown Periwound Skin Texture Texture Color No Abnormalities Noted: No No Abnormalities Noted: No Moisture Temperature / Pain No Abnormalities Noted: No Temperature: No Abnormality Tenderness on Palpation: Yes Wound Preparation Ulcer Cleansing: Rinsed/Irrigated with Saline Topical Anesthetic Applied: Other: lidocaine 4%, Treatment Notes Wound #1 (Left, Anterior Lower Leg) 1. Cleansed with: Clean wound with Normal Saline 2. Anesthetic Topical Lidocaine 4% cream to wound bed prior to debridement 3. Peri-wound Care: Moisturizing lotion 4. Dressing Applied: Aquacel Ag 5. Secondary Dressing Applied ABD Pad 7. Secured with Tape Notes drawtex, kerlix, coban Electronic Signature(s) Signed: 08/08/2016 2:39:38 PM By: Alejandro Mulling Entered By: Alejandro Mulling on 08/08/2016 10:31:57 Tamara Campbell ([id number]) -------------------------------------------------------------------------------- Wound Assessment Details Patient Name: Tamara Campbell. Date of Service:  08/08/2016 9:45 AM Medical Record Number: [id number] Patient Account Number: [id number] Date of Birth/Sex: October 01, 1930 (83 y.o. Female) Treating RN: Ashok Cordia, Debi Primary Care Tecora Eustache: Jodi Mourning Other Clinician: Referring Princella Jaskiewicz: Jodi Mourning Treating Talisa Petrak/Extender: Rudene Re in Treatment: 0 Wound Status Wound Number: 2 Primary Diabetic Wound/Ulcer of the Lower Etiology: Extremity Wound Location: Right Lower Leg - Anterior Wound Open Wounding Event: Blister Status: Date Acquired: 08/01/2016 Comorbid Arrhythmia, Congestive Heart Failure, Weeks Of Treatment: 0 History: Hypertension, Type II Diabetes, Clustered Wound: No Osteoarthritis Wound Measurements Length: (cm) 13 Width: (cm) 18 Depth: (cm) 0.1 Area: (cm) 183.783 Volume: (cm) 18.378 % Reduction in Area: % Reduction in Volume: Epithelialization: None Tunneling: No Undermining: No Wound Description Classification: Grade 2 Wound Margin: Distinct, outline attached Exudate Amount: Large Exudate Type: Serous Exudate Color: amber Foul Odor After Cleansing: No Slough/Fibrino No Wound Bed Granulation Amount: None Present (0%) Exposed Structure Necrotic Amount: Large (67-100%) Fascia Exposed: No Necrotic Quality: Eschar Fat Layer (Subcutaneous Tissue) Exposed: No Tendon Exposed: No Muscle Exposed: No Joint Exposed: No Bone Exposed: No Limited to Skin Breakdown Periwound Skin Texture Texture Color No Abnormalities Noted: No No Abnormalities Noted: No Moisture Temperature / Pain No Abnormalities Noted: No Temperature: No Abnormality Tenderness on Palpation: Yes Tamara Campbell, Tamara C. ([id number]) Wound Preparation Ulcer Cleansing: Rinsed/Irrigated with Saline Topical Anesthetic Applied: Other: lidocaine 4%, Treatment Notes Wound #2 (Right, Anterior Lower Leg) 1. Cleansed with: Clean wound with Normal Saline 2. Anesthetic Topical Lidocaine 4% cream to wound bed prior to debridement 3.  Peri-wound Care: Moisturizing lotion 4. Dressing Applied: Aquacel Ag 5. Secondary Dressing Applied ABD Pad 7. Secured with Tape Notes drawtex, kerlix, coban Electronic Signature(s) Signed: 08/08/2016 2:39:38 PM By: Alejandro Mulling Entered By: Alejandro Mulling on 08/08/2016 10:33:30 Tamara Campbell ([id number]) -------------------------------------------------------------------------------- Vitals Details Patient Name: Tamara Campbell Date of Service: 08/08/2016 9:45 AM Medical Record Number: [id number] Patient Account Number: [id number] Date of Birth/Sex: 11/11/1930 (83 y.o. Female) Treating RN: Ashok Cordia, Debi Primary Care Montario Zilka: Jodi Mourning Other Clinician: Referring Jeremy Mclamb: Jodi Mourning Treating Marshell Rieger/Extender: Rudene Re in Treatment: 0 Vital Signs Time Taken: 10:02 Temperature (F): 97.9 Height (in): 62 Pulse (bpm): 72 Source: Stated Respiratory Rate (breaths/min): 16 Weight (lbs): 130 Blood Pressure (mmHg): 154/81 Source: Measured Reference Range: 80 - 120 mg / dl Body Mass Index (BMI): 23.8 Electronic Signature(s) Signed: 08/08/2016 2:39:38 PM By: Alejandro Mulling Entered By: Alejandro Mulling  on 08/08/2016 10:02:48

## 2016-08-08 NOTE — Progress Notes (Signed)
Tamara Campbell, Tamara Campbell ([id number]) Visit Report for 08/08/2016 Abuse/Suicide Risk Screen Details Patient Name: Tamara Campbell, Tamara Campbell. Date of Service: 08/08/2016 9:45 AM Medical Record Number: [id number] Patient Account Number: [id number] Date of Birth/Sex: 1930/11/13 (83 y.o. Female) Treating RN: Ashok Cordia, Debi Primary Care Delany Steury: Jodi Mourning Other Clinician: Referring Mintie Witherington: Jodi Mourning Treating Alaynah Schutter/Extender: Rudene Re in Treatment: 0 Abuse/Suicide Risk Screen Items Answer ABUSE/SUICIDE RISK SCREEN: Has anyone close to you tried to hurt or harm you recentlyo No Do you feel uncomfortable with anyone in your familyo No Has anyone forced you do things that you didnot want to doo No Do you have any thoughts of harming yourselfo No Patient displays signs or symptoms of abuse and/or neglect. No Electronic Signature(s) Signed: 08/08/2016 2:39:38 PM By: Alejandro Mulling Entered By: Alejandro Mulling on 08/08/2016 10:11:21 Tamara Campbell ([id number]) -------------------------------------------------------------------------------- Activities of Daily Living Details Patient Name: Tamara Campbell. Date of Service: 08/08/2016 9:45 AM Medical Record Number: [id number] Patient Account Number: [id number] Date of Birth/Sex: 10-18-1930 (83 y.o. Female) Treating RN: Ashok Cordia, Debi Primary Care Ugochi Henzler: Jodi Mourning Other Clinician: Referring Vaden Becherer: Jodi Mourning Treating Woodruff Skirvin/Extender: Rudene Re in Treatment: 0 Activities of Daily Living Items Answer Activities of Daily Living (Please select one for each item) Drive Automobile Not Able Take Medications Need Assistance Use Telephone Completely Able Care for Appearance Need Assistance Use Toilet Need Assistance Bath / Shower Need Assistance Dress Self Need Assistance Feed Self Completely Able Walk Need Assistance Get In / Out Bed Need Assistance Housework Not Able Prepare Meals Not Able Handle Money  Not Able Shop for Self Not Able Electronic Signature(s) Signed: 08/08/2016 2:39:38 PM By: Alejandro Mulling Entered By: Alejandro Mulling on 08/08/2016 10:11:58 Tamara Campbell ([id number]) -------------------------------------------------------------------------------- Education Assessment Details Patient Name: Tamara Campbell Date of Service: 08/08/2016 9:45 AM Medical Record Number: [id number] Patient Account Number: [id number] Date of Birth/Sex: [birth year] (83 y.o. Female) Treating RN: Ashok Cordia, Debi Primary Care Allenmichael Mcpartlin: Jodi Mourning Other Clinician: Referring Neythan Kozlov: Jodi Mourning Treating Destenie Ingber/Extender: Rudene Re in Treatment: 0 Primary Learner Assessed: Patient Learning Preferences/Education Level/Primary Language Learning Preference: Explanation, Printed Material Highest Education Level: High School Preferred Language: English Cognitive Barrier Assessment/Beliefs Language Barrier: No Translator Needed: No Memory Deficit: No Emotional Barrier: No Cultural/Religious Beliefs Affecting Medical No Care: Physical Barrier Assessment Impaired Vision: Yes Glasses Impaired Hearing: Yes HOH Decreased Hand dexterity: No Knowledge/Comprehension Assessment Knowledge Level: Medium Comprehension Level: Medium Ability to understand written Medium instructions: Ability to understand verbal Medium instructions: Motivation Assessment Anxiety Level: Calm Cooperation: Cooperative Education Importance: Acknowledges Need Interest in Health Problems: Asks Questions Perception: Coherent Willingness to Engage in Self- Medium Management Activities: Readiness to Engage in Self- Medium Management Activities: Electronic Signature(s) Tamara Campbell, Tamara Campbell ([id number]) Signed: 08/08/2016 2:39:38 PM By: Alejandro Mulling Entered By: Alejandro Mulling on 08/08/2016 10:12:20 Tamara Campbell  ([id number]) -------------------------------------------------------------------------------- Fall Risk Assessment Details Patient Name: Tamara Campbell Date of Service: 08/08/2016 9:45 AM Medical Record Number: [id number] Patient Account Number: [id number] Date of Birth/Sex: [birth year] (83 y.o. Female) Treating RN: Ashok Cordia, Debi Primary Care Ariel Wingrove: Jodi Mourning Other Clinician: Referring Mohan Erven: Jodi Mourning Treating Joie Reamer/Extender: Rudene Re in Treatment: 0 Fall Risk Assessment Items Have you had 2 or more falls in the last 12 monthso 0 Yes Have you had any fall that resulted in injury in the last 12 monthso 0 Yes FALL RISK ASSESSMENT: History of falling - immediate or within 3 months 25 Yes Secondary diagnosis 15 Yes  Ambulatory aid None/bed rest/wheelchair/nurse 0 No Crutches/cane/Twedt 0 No Furniture 0 No IV Access/Saline Lock 0 No Gait/Training Normal/bed rest/immobile 0 No Weak 10 Yes Impaired 20 Yes Mental Status Oriented to own ability 0 Yes Electronic Signature(s) Signed: 08/08/2016 2:39:38 PM By: Alejandro MullingPinkerton, Debra Entered By: Alejandro MullingPinkerton, Debra on 08/08/2016 10:12:52 Tamara Campbell, Tamara C. ([id number]) -------------------------------------------------------------------------------- Foot Assessment Details Patient Name: Tamara Campbell, Tamara C. Date of Service: 08/08/2016 9:45 AM Medical Record Number: [id number] Patient Account Number: [id number] Date of Birth/Sex: 10/07/1930 (83 y.o. Female) Treating RN: Ashok CordiaPinkerton, Debi Primary Care Ramadan Couey: Jodi MourningWalker III, John Other Clinician: Referring Illyanna Petillo: Jodi MourningWalker III, John Treating Vitaliy Eisenhour/Extender: Rudene ReBritto, Errol Weeks in Treatment: 0 Foot Assessment Items Site Locations + = Sensation present, - = Sensation absent, C = Callus, U = Ulcer R = Redness, W = Warmth, M = Maceration, PU = Pre-ulcerative lesion F = Fissure, S = Swelling, D = Dryness Assessment Right: Left: Other Deformity: No No Prior Foot Ulcer: No  No Prior Amputation: No No Charcot Joint: No No Ambulatory Status: Ambulatory With Help Assistance Device: Lorenzen Gait: Steady Electronic Signature(s) Signed: 08/08/2016 2:39:38 PM By: Alejandro MullingPinkerton, Debra Entered By: Alejandro MullingPinkerton, Debra on 08/08/2016 10:16:22 Tamara Campbell, Tamara C. ([id number]) -------------------------------------------------------------------------------- Nutrition Risk Assessment Details Patient Name: Tamara Campbell, Tamara C. Date of Service: 08/08/2016 9:45 AM Medical Record Number: [id number] Patient Account Number: [id number] Date of Birth/Sex: 01/27/1931 (83 y.o. Female) Treating RN: Ashok CordiaPinkerton, Debi Primary Care Lyla Jasek: Jodi MourningWalker III, John Other Clinician: Referring Bodin Gorka: Jodi MourningWalker III, John Treating Yisrael Obryan/Extender: Rudene ReBritto, Errol Weeks in Treatment: 0 Height (in): 62 Weight (lbs): 130 Body Mass Index (BMI): 23.8 Nutrition Risk Assessment Items NUTRITION RISK SCREEN: I have an illness or condition that made me change the kind and/or 0 No amount of food I eat I eat fewer than two meals per day 3 Yes I eat few fruits and vegetables, or milk products 0 No I have three or more drinks of beer, liquor or wine almost every day 0 No I have tooth or mouth problems that make it hard for me to eat 0 No I don't always have enough money to buy the food I need 0 No I eat alone most of the time 0 No I take three or more different prescribed or over-the-counter drugs a 1 Yes day Without wanting to, I have lost or gained 10 pounds in the last six 0 No months I am not always physically able to shop, cook and/or feed myself 0 No Nutrition Protocols Good Risk Protocol Moderate Risk Protocol Electronic Signature(s) Signed: 08/08/2016 2:39:38 PM By: Alejandro MullingPinkerton, Debra Entered By: Alejandro MullingPinkerton, Debra on 08/08/2016 10:13:07

## 2016-08-15 ENCOUNTER — Ambulatory Visit: Payer: Medicare HMO | Admitting: Surgery

## 2016-08-22 ENCOUNTER — Encounter: Payer: Medicare HMO | Admitting: Surgery

## 2016-08-22 DIAGNOSIS — L97821 Non-pressure chronic ulcer of other part of left lower leg limited to breakdown of skin: Secondary | ICD-10-CM | POA: Diagnosis not present

## 2016-08-22 DIAGNOSIS — I429 Cardiomyopathy, unspecified: Secondary | ICD-10-CM | POA: Diagnosis not present

## 2016-08-22 DIAGNOSIS — I890 Lymphedema, not elsewhere classified: Secondary | ICD-10-CM | POA: Diagnosis not present

## 2016-08-22 DIAGNOSIS — Z950 Presence of cardiac pacemaker: Secondary | ICD-10-CM | POA: Diagnosis not present

## 2016-08-22 DIAGNOSIS — I110 Hypertensive heart disease with heart failure: Secondary | ICD-10-CM | POA: Diagnosis not present

## 2016-08-22 DIAGNOSIS — I872 Venous insufficiency (chronic) (peripheral): Secondary | ICD-10-CM | POA: Diagnosis not present

## 2016-08-22 DIAGNOSIS — I509 Heart failure, unspecified: Secondary | ICD-10-CM | POA: Diagnosis not present

## 2016-08-22 DIAGNOSIS — L97819 Non-pressure chronic ulcer of other part of right lower leg with unspecified severity: Secondary | ICD-10-CM | POA: Diagnosis not present

## 2016-08-22 DIAGNOSIS — E11622 Type 2 diabetes mellitus with other skin ulcer: Secondary | ICD-10-CM | POA: Diagnosis not present

## 2016-08-22 DIAGNOSIS — L97211 Non-pressure chronic ulcer of right calf limited to breakdown of skin: Secondary | ICD-10-CM | POA: Diagnosis not present

## 2016-08-22 DIAGNOSIS — I4891 Unspecified atrial fibrillation: Secondary | ICD-10-CM | POA: Diagnosis not present

## 2016-08-22 DIAGNOSIS — L97221 Non-pressure chronic ulcer of left calf limited to breakdown of skin: Secondary | ICD-10-CM | POA: Diagnosis not present

## 2016-08-22 NOTE — Progress Notes (Addendum)
WALSIE, SMELTZ ([id number]) Visit Report for 08/22/2016 Chief Complaint Document Details Patient Name: Tamara Campbell. Date of Service: 08/22/2016 1:30 PM Medical Record Number: [id number] Patient Account Number: [id number] Date of Birth/Sex: 11-12-1930 (83 y.o. Female) Treating RN: Curtis Sites Primary Care Provider: Jodi Mourning Other Clinician: Referring Provider: Jodi Mourning Treating Provider/Extender: Rudene Re in Treatment: 2 Information Obtained from: Patient Chief Complaint Patient presents to the wound care center for a consult due non healing wound both lower extremities with swelling for the last 2 weeks Electronic Signature(s) Signed: 08/22/2016 2:20:08 PM By: Evlyn Kanner MD, FACS Entered By: Evlyn Kanner on 08/22/2016 14:20:08 Tamara Campbell ([id number]) -------------------------------------------------------------------------------- HPI Details Patient Name: Tamara Campbell. Date of Service: 08/22/2016 1:30 PM Medical Record Number: [id number] Patient Account Number: [id number] Date of Birth/Sex: [birth year] (83 y.o. Female) Treating RN: Curtis Sites Primary Care Provider: Jodi Mourning Other Clinician: Referring Provider: Jodi Mourning Treating Provider/Extender: Rudene Re in Treatment: 2 History of Present Illness Location: both lower extremities Quality: Patient reports No Pain. Severity: Patient states wound (s) are getting better. Duration: Patient has had the wound for < 2 weeks prior to presenting for treatment Context: The wound appeared gradually over time Modifying Factors: Other treatment(s) tried include:Silvadene cream and compression wraps Associated Signs and Symptoms: Patient reports having increase swelling. HPI Description: 83 year old patient who has been sent to see [place] by her PCP Dr. Yates Decamp, for open wounds on her left lower extremity, limited to breakdown of skin, possibly due to venous stasis ulcer,  has a past medical history of diabetes mellitus type 2, status post cardiac pacemaker, essential hypertension, atrial fibrillation, CHF, cardiomyopathy, ventricular tachycardia, status post right total knee arthroplasty in 2014, back surgery in 1996, hysterectomy. She has never been a smoker. her PCP found that she had stasis edema of both lower extremities left worse than right and had ulceration on the lateral aspect of her left leg. Last hemoglobin A1c done and February of this year was 6.3% patient's daughter tells me that she has seen at [facility] for an opinion about a year ago and hence we will try and obtain these reports regarding the workup done and the recommendations made. 08/22/2016 -- -- the patient was seen by Dr. Gilda Crease on 06/22/2015 and after examination he made a note of her atherosclerosis of the lower extremities with ulceration and recommended noninvasive studies. The patient was seen back in follow-up on 07/06/2015 and her right ABI was 1.13 and the left was 1.20 with triphasic pedal signals bilaterally. Duplex ultrasound of the left lower extremity showed SFA, popliteal and tibial arteries with diffuse disease but no focal stenosis and triphasic. His impression was since these arterial studies were normal he thought the problem for the ulceration was more venous then a peripheral arterial disease. Commended to continue with graduated compression and follow back in 3 months. Electronic Signature(s) Signed: 08/22/2016 2:20:29 PM By: Evlyn Kanner MD, FACS Previous Signature: 08/22/2016 1:43:33 PM Version By: Evlyn Kanner MD, FACS Previous Signature: 08/22/2016 1:25:19 PM Version By: Evlyn Kanner MD, FACS Entered By: Evlyn Kanner on 08/22/2016 14:20:29 Tamara Campbell ([id number]) -------------------------------------------------------------------------------- Physical Exam Details Patient Name: Tamara Campbell, Tamara Campbell. Date of Service: 08/22/2016 1:30  PM Medical Record Number: [id number] Patient Account Number: [id number] Date of Birth/Sex: 01/01/1931 (83 y.o. Female) Treating RN: Curtis Sites Primary Care Provider: Jodi Mourning Other Clinician: Referring Provider: Jodi Mourning Treating Provider/Extender: Rudene Re in Treatment: 2 Constitutional .  Pulse regular. Respirations normal and unlabored. Afebrile. . Eyes Nonicteric. Reactive to light. Ears, Nose, Mouth, and Throat Lips, teeth, and gums WNL.Marland Kitchen Moist mucosa without lesions. Neck supple and nontender. No palpable supraclavicular or cervical adenopathy. Normal sized without goiter. Respiratory WNL. No retractions.. Breath sounds WNL, No rubs, rales, rhonchi, or wheeze.. Cardiovascular Heart rhythm and rate regular, no murmur or gallop.. Pedal Pulses WNL. No clubbing, cyanosis or edema. Chest Breasts symmetical and no nipple discharge.. Breast tissue WNL, no masses, lumps, or tenderness.. Lymphatic No adneopathy. No adenopathy. No adenopathy. Musculoskeletal Adexa without tenderness or enlargement.. Digits and nails w/o clubbing, cyanosis, infection, petechiae, ischemia, or inflammatory conditions.. Integumentary (Hair, Skin) No suspicious lesions. No crepitus or fluctuance. No peri-wound warmth or erythema. No masses.Marland Kitchen Psychiatric Judgement and insight Intact.. No evidence of depression, anxiety, or agitation.. Notes the right lower extremity blisters completely healed and there are no open ulcerations. The left side has a fairly deep ulceration which was washed out with moist saline gauze but no sharp debridement was possible because of tenderness. Electronic Signature(s) Signed: 08/22/2016 2:20:57 PM By: Evlyn Kanner MD, FACS Entered By: Evlyn Kanner on 08/22/2016 14:20:56 Tamara Campbell ([id number]) -------------------------------------------------------------------------------- Physician Orders Details Patient Name: Tamara Campbell Date of  Service: 08/22/2016 1:30 PM Medical Record Number: [id number] Patient Account Number: [id number] Date of Birth/Sex: 02-10-1931 (83 y.o. Female) Treating RN: Curtis Sites Primary Care Provider: Jodi Mourning Other Clinician: Referring Provider: Jodi Mourning Treating Provider/Extender: Rudene Re in Treatment: 2 Verbal / Phone Orders: No Diagnosis Coding Wound Cleansing Wound #1 Left,Anterior Lower Leg o Clean wound with Normal Saline. o May Shower, gently pat wound dry prior to applying new dressing. Anesthetic Wound #1 Left,Anterior Lower Leg o Topical Lidocaine 4% cream applied to wound bed prior to debridement - clinic use only Skin Barriers/Peri-Wound Care Wound #1 Left,Anterior Lower Leg o Moisturizing lotion Primary Wound Dressing Wound #1 Left,Anterior Lower Leg o Aquacel Ag Secondary Dressing Wound #1 Left,Anterior Lower Leg o ABD pad o Drawtex Dressing Change Frequency Wound #1 Left,Anterior Lower Leg o Change dressing every week Follow-up Appointments Wound #1 Left,Anterior Lower Leg o Return Appointment in 1 week. Edema Control Wound #1 Left,Anterior Lower Leg o 3 Layer Compression System - Left Lower Extremity o Elevate legs to the level of the heart and pump ankles as often as possible Deleonardis, Carlyann C. ([id number]) Wound #2 Right,Anterior Lower Leg o Patient to wear own compression stockings Additional Orders / Instructions Wound #1 Left,Anterior Lower Leg o Increase protein intake. Wound #2 Right,Anterior Lower Leg o Increase protein intake. Electronic Signature(s) Signed: 08/22/2016 4:53:16 PM By: Evlyn Kanner MD, FACS Signed: 08/22/2016 4:59:17 PM By: Curtis Sites Entered By: Curtis Sites on 08/22/2016 14:13:28 Tamara Campbell ([id number]) -------------------------------------------------------------------------------- Problem List Details Patient Name: Tamara Campbell, Tamara Campbell. Date of Service: 08/22/2016 1:30  PM Medical Record Number: [id number] Patient Account Number: [id number] Date of Birth/Sex: July 27, 1930 (83 y.o. Female) Treating RN: Curtis Sites Primary Care Provider: Jodi Mourning Other Clinician: Referring Provider: Jodi Mourning Treating Provider/Extender: Rudene Re in Treatment: 2 Active Problems ICD-10 Encounter Code Description Active Date Diagnosis E11.622 Type 2 diabetes mellitus with other skin ulcer 08/08/2016 Yes I89.0 Lymphedema, not elsewhere classified 08/08/2016 Yes L97.221 Non-pressure chronic ulcer of left calf limited to 08/08/2016 Yes breakdown of skin L97.211 Non-pressure chronic ulcer of right calf limited to 08/08/2016 Yes breakdown of skin Inactive Problems Resolved Problems Electronic Signature(s) Signed: 08/22/2016 2:19:50 PM By: Evlyn Kanner MD, FACS Entered By: Meyer Russel  Amaal Dimartino on 08/22/2016 14:19:49 GIULIANA, HANDYSIDE ([id number]) -------------------------------------------------------------------------------- Progress Note Details Patient Name: Tamara Campbell, Tamara Campbell. Date of Service: 08/22/2016 1:30 PM Medical Record Number: [id number] Patient Account Number: [id number] Date of Birth/Sex: 12/16/1930 (83 y.o. Female) Treating RN: Curtis Sites Primary Care Provider: Jodi Mourning Other Clinician: Referring Provider: Jodi Mourning Treating Provider/Extender: Rudene Re in Treatment: 2 Subjective Chief Complaint Information obtained from Patient Patient presents to the wound care center for a consult due non healing wound both lower extremities with swelling for the last 2 weeks History of Present Illness (HPI) The following HPI elements were documented for the patient's wound: Location: both lower extremities Quality: Patient reports No Pain. Severity: Patient states wound (s) are getting better. Duration: Patient has had the wound for < 2 weeks prior to presenting for treatment Context: The wound appeared gradually over  time Modifying Factors: Other treatment(s) tried include:Silvadene cream and compression wraps Associated Signs and Symptoms: Patient reports having increase swelling. 83 year old patient who has been sent to see [place] by her PCP Dr. Yates Decamp, for open wounds on her left lower extremity, limited to breakdown of skin, possibly due to venous stasis ulcer, has a past medical history of diabetes mellitus type 2, status post cardiac pacemaker, essential hypertension, atrial fibrillation, CHF, cardiomyopathy, ventricular tachycardia, status post right total knee arthroplasty in 2014, back surgery in 1996, hysterectomy. She has never been a smoker. her PCP found that she had stasis edema of both lower extremities left worse than right and had ulceration on the lateral aspect of her left leg. Last hemoglobin A1c done and February of this year was 6.3% patient's daughter tells me that she has seen at [facility] for an opinion about a year ago and hence we will try and obtain these reports regarding the workup done and the recommendations made. 08/22/2016 -- -- the patient was seen by Dr. Gilda Crease on 06/22/2015 and after examination he made a note of her atherosclerosis of the lower extremities with ulceration and recommended noninvasive studies. The patient was seen back in follow-up on 07/06/2015 and her right ABI was 1.13 and the left was 1.20 with triphasic pedal signals bilaterally. Duplex ultrasound of the left lower extremity showed SFA, popliteal and tibial arteries with diffuse disease but no focal stenosis and triphasic. His impression was since these arterial studies were normal he thought the problem for the ulceration was more venous then a peripheral arterial disease. Commended to continue with graduated compression and follow back in 3 months. VASILIA, DISE ([id number]) Objective Constitutional Pulse regular. Respirations normal and unlabored. Afebrile. Vitals Time  Taken: 1:39 PM, Height: 62 in, Weight: 130 lbs, BMI: 23.8, Temperature: 97.5 F, Pulse: 71 bpm, Respiratory Rate: 18 breaths/min, Blood Pressure: 122/58 mmHg. Eyes Nonicteric. Reactive to light. Ears, Nose, Mouth, and Throat Lips, teeth, and gums WNL.Marland Kitchen Moist mucosa without lesions. Neck supple and nontender. No palpable supraclavicular or cervical adenopathy. Normal sized without goiter. Respiratory WNL. No retractions.. Breath sounds WNL, No rubs, rales, rhonchi, or wheeze.. Cardiovascular Heart rhythm and rate regular, no murmur or gallop.. Pedal Pulses WNL. No clubbing, cyanosis or edema. Chest Breasts symmetical and no nipple discharge.. Breast tissue WNL, no masses, lumps, or tenderness.. Lymphatic No adneopathy. No adenopathy. No adenopathy. Musculoskeletal Adexa without tenderness or enlargement.. Digits and nails w/o clubbing, cyanosis, infection, petechiae, ischemia, or inflammatory conditions.Marland Kitchen Psychiatric Judgement and insight Intact.. No evidence of depression, anxiety, or agitation.. General Notes: the right lower extremity blisters completely healed and  there are no open ulcerations. The left side has a fairly deep ulceration which was washed out with moist saline gauze but no sharp debridement was possible because of tenderness. Integumentary (Hair, Skin) No suspicious lesions. No crepitus or fluctuance. No peri-wound warmth or erythema. No masses.. Wound #1 status is Open. Original cause of wound was Blister. The wound is located on the Left,Anterior Lower Leg. The wound measures 1.1cm length x 1.1cm width x 0.2cm depth; 0.95cm^2 area and 0.19cm^3 Stroebel, Marcus C. ([id number]) volume. The wound is limited to skin breakdown. There is no tunneling or undermining noted. There is a large amount of serous drainage noted. The wound margin is distinct with the outline attached to the wound base. There is medium (34-66%) red granulation within the wound bed. There is a medium  (34-66%) amount of necrotic tissue within the wound bed including Eschar and Adherent Slough. Periwound temperature was noted as No Abnormality. The periwound has tenderness on palpation. Wound #2 status is Open. Original cause of wound was Blister. The wound is located on the Right,Anterior Lower Leg. The wound measures 0.1cm length x 0.1cm width x 0.1cm depth; 0.008cm^2 area and 0.001cm^3 volume. The wound is limited to skin breakdown. There is no tunneling or undermining noted. There is a large amount of serous drainage noted. The wound margin is distinct with the outline attached to the wound base. There is large (67-100%) granulation within the wound bed. There is no necrotic tissue within the wound bed. The periwound skin appearance did not exhibit: Callus, Crepitus, Excoriation, Induration, Rash, Scarring, Dry/Scaly, Maceration, Atrophie Blanche, Cyanosis, Ecchymosis, Hemosiderin Staining, Mottled, Pallor, Rubor, Erythema. Periwound temperature was noted as No Abnormality. The periwound has tenderness on palpation. Assessment Active Problems ICD-10 E11.622 - Type 2 diabetes mellitus with other skin ulcer I89.0 - Lymphedema, not elsewhere classified L97.221 - Non-pressure chronic ulcer of left calf limited to breakdown of skin L97.211 - Non-pressure chronic ulcer of right calf limited to breakdown of skin Plan Wound Cleansing: Wound #1 Left,Anterior Lower Leg: Clean wound with Normal Saline. May Shower, gently pat wound dry prior to applying new dressing. Anesthetic: Wound #1 Left,Anterior Lower Leg: Topical Lidocaine 4% cream applied to wound bed prior to debridement - clinic use only Skin Barriers/Peri-Wound Care: Wound #1 Left,Anterior Lower Leg: Moisturizing lotion Primary Wound Dressing: Wound #1 Left,Anterior Lower Leg: Aquacel Ag Secondary Dressing: Tamara Campbell, Tamara Campbell ([id number]) Wound #1 Left,Anterior Lower Leg: ABD pad Drawtex Dressing Change Frequency: Wound #1  Left,Anterior Lower Leg: Change dressing every week Follow-up Appointments: Wound #1 Left,Anterior Lower Leg: Return Appointment in 1 week. Edema Control: Wound #1 Left,Anterior Lower Leg: 3 Layer Compression System - Left Lower Extremity Elevate legs to the level of the heart and pump ankles as often as possible Wound #2 Right,Anterior Lower Leg: Patient to wear own compression stockings Additional Orders / Instructions: Wound #1 Left,Anterior Lower Leg: Increase protein intake. Wound #2 Right,Anterior Lower Leg: Increase protein intake. I have reviewed the workup done at [facility] in the past. After review I have recommended: 1. Silver alginate with a 3 layer Profore to be applied to the left lower extremities and keep it unchanged for the week. 2. juxta light compressions to be used on the right lower extremity and we will order these for bilateral lower legs. 3. elevation and exercise discussed with her in detail. 4. I have reviewed the reports from [facility] 5. Regular visits the wound center The patient's daughter and daughter-in-law both at  the bedside have read all questions answered Electronic Signature(s) Signed: 08/22/2016 2:23:14 PM By: Evlyn KannerBritto, Terrion Gencarelli MD, FACS Entered By: Evlyn KannerBritto, Findley Blankenbaker on 08/22/2016 14:23:14 Tamara Campbell, Tamara C. ([id number]) -------------------------------------------------------------------------------- SuperBill Details Patient Name: Tamara Campbell, Tamara C. Date of Service: 08/22/2016 Medical Record Number: [id number] Patient Account Number: [id number] Date of Birth/Sex: 04/25/1931 (83 y.o. Female) Treating RN: Curtis Sitesorthy, Joanna Primary Care Provider: Jodi MourningWalker III, John Other Clinician: Referring Provider: Jodi MourningWalker III, John Treating Provider/Extender: Evlyn KannerBritto, Reyaan Thoma Service Line: Outpatient Weeks in Treatment: 2 Diagnosis Coding ICD-10 Codes Code Description [phone] Type 2 diabetes mellitus with other skin ulcer I89.0 Lymphedema,  not elsewhere classified L97.221 Non-pressure chronic ulcer of left calf limited to breakdown of skin L97.211 Non-pressure chronic ulcer of right calf limited to breakdown of skin Facility Procedures CPT4: Description Modifier Quantity Code [id number] (Facility Use Only) [id number] - APPLY MULTLAY COMPRS LWR LT 1 LEG Physician Procedures CPT4 Code Description: [id number] - WC PHYS LEVEL 3 - EST PT ICD-10 Description Diagnosis E11.622 Type 2 diabetes mellitus with other skin ulcer I89.0 Lymphedema, not elsewhere classified L97.221 Non-pressure chronic ulcer of left calf limited to Modifier: breakdown of s Quantity: 1 kin Electronic Signature(s) Signed: 08/22/2016 3:04:07 PM By: Curtis Sitesorthy, Joanna Signed: 08/22/2016 4:53:16 PM By: Evlyn KannerBritto, Thao Bauza MD, FACS Previous Signature: 08/22/2016 2:23:52 PM Version By: Evlyn KannerBritto, Saima Monterroso MD, FACS Entered By: Curtis Sitesorthy, Joanna on 08/22/2016 15:04:06

## 2016-08-23 NOTE — Progress Notes (Signed)
Tamara, Campbell ([id number]) Visit Report for 08/22/2016 Arrival Information Details Patient Name: Tamara Campbell, Tamara Campbell. Date of Service: 08/22/2016 1:30 PM Medical Record Number: [id number] Patient Account Number: [id number] Date of Birth/Sex: 1930/08/08 (83 y.o. Female) Treating RN: Curtis Sites Primary Care Lorella Gomez: Jodi Mourning Other Clinician: Referring Eliyohu Class: Jodi Mourning Treating Monzerat Handler/Extender: Rudene Re in Treatment: 2 Visit Information History Since Last Visit Added or deleted any medications: No Patient Arrived: [place] Any new allergies or adverse reactions: No Arrival Time: 13:33 Had a fall or experienced change in No Accompanied By: dtr activities of daily living that may affect Transfer Assistance: None risk of falls: Patient Identification Verified: Yes Signs or symptoms of abuse/neglect since last No Secondary Verification Process Yes visito Completed: Hospitalized since last visit: No Patient Requires No Has Dressing in Place as Prescribed: Yes Transmission-Based Pain Present Now: No Precautions: Patient Has Alerts: Yes Patient Alerts: Patient on Blood Thinner Eliquis DM II R ABI noncompressible Electronic Signature(s) Signed: 08/22/2016 4:59:17 PM By: Curtis Sites Entered By: Curtis Sites on 08/22/2016 13:38:56 Tamara Campbell ([id number]) -------------------------------------------------------------------------------- Encounter Discharge Information Details Patient Name: Tamara Campbell. Date of Service: 08/22/2016 1:30 PM Medical Record Number: [id number] Patient Account Number: [id number] Date of Birth/Sex: December 17, 1930 (83 y.o. Female) Treating RN: Curtis Sites Primary Care Yazmen Briones: Jodi Mourning Other Clinician: Referring Leora Platt: Jodi Mourning Treating Gee Habig/Extender: Rudene Re in Treatment: 2 Encounter Discharge Information Items Discharge Pain Level: 0 Discharge Condition: Stable Ambulatory  Status: [place] Discharge Destination: Home Transportation: Private Auto Accompanied By: dtr Schedule Follow-up Appointment: Yes Medication Reconciliation completed No and provided to Patient/Care Vitor Overbaugh: Provided on Clinical Summary of Care: 08/22/2016 Form Type Recipient Paper Patient AW Electronic Signature(s) Signed: 08/22/2016 3:05:11 PM By: Curtis Sites Previous Signature: 08/22/2016 2:30:08 PM Version By: Gwenlyn Perking Entered By: Curtis Sites on 08/22/2016 15:05:11 Tamara Campbell ([id number]) -------------------------------------------------------------------------------- Lower Extremity Assessment Details Patient Name: Tamara Campbell. Date of Service: 08/22/2016 1:30 PM Medical Record Number: [id number] Patient Account Number: [id number] Date of Birth/Sex: Jan 28, 1931 (83 y.o. Female) Treating RN: Curtis Sites Primary Care Deval Mroczka: Jodi Mourning Other Clinician: Referring Shene Maxfield: Jodi Mourning Treating Rayli Wiederhold/Extender: Rudene Re in Treatment: 2 Edema Assessment Assessed: [Left: No] [Right: No] E[Left: dema] [Right: :] Calf Left: Right: Point of Measurement: 32 cm From Medial Instep 31.2 cm 31.3 cm Ankle Left: Right: Point of Measurement: 9 cm From Medial Instep 21.1 cm 21.3 cm Vascular Assessment Pulses: Dorsalis Pedis Palpable: [Left:Yes] [Right:Yes] Posterior Tibial Extremity colors, hair growth, and conditions: Extremity Color: [Left:Hyperpigmented] [Right:Hyperpigmented] Hair Growth on Extremity: [Left:No] [Right:No] Temperature of Extremity: [Left:Warm] [Right:Warm] Capillary Refill: [Left:< 3 seconds] [Right:< 3 seconds] Electronic Signature(s) Signed: 08/22/2016 4:59:17 PM By: Curtis Sites Entered By: Curtis Sites on 08/22/2016 13:58:50 Bocek, Arlis Porta ([id number]) -------------------------------------------------------------------------------- Multi Wound Chart Details Patient Name: Tamara Campbell. Date of Service:  08/22/2016 1:30 PM Medical Record Number: [id number] Patient Account Number: [id number] Date of Birth/Sex: 04-Mar-1931 (83 y.o. Female) Treating RN: Curtis Sites Primary Care Emaya Preston: Jodi Mourning Other Clinician: Referring Rocklin Soderquist: Jodi Mourning Treating Katelin Kutsch/Extender: Rudene Re in Treatment: 2 Vital Signs Height(in): 62 Pulse(bpm): 71 Weight(lbs): 130 Blood Pressure 122/58 (mmHg): Body Mass Index(BMI): 24 Temperature(F): 97.5 Respiratory Rate 18 (breaths/min): Photos: [1:No Photos] [2:No Photos] [N/A:N/A] Wound Location: [1:Left Lower Leg - Anterior] [2:Right, Anterior Lower Leg] [N/A:N/A] Wounding Event: [1:Blister] [2:Blister] [N/A:N/A] Primary Etiology: [1:Diabetic Wound/Ulcer of the Lower Extremity] [2:Diabetic Wound/Ulcer of the Lower Extremity] [N/A:N/A] Comorbid History: [1:Arrhythmia, Congestive Heart Failure,  Hypertension, Type II Diabetes, Osteoarthritis] [2:Arrhythmia, Congestive Heart Failure, Hypertension, Type II Diabetes, Osteoarthritis] [N/A:N/A] Date Acquired: [1:08/01/2016] [2:08/01/2016] [N/A:N/A] Weeks of Treatment: [1:2] [2:2] [N/A:N/A] Wound Status: [1:Open] [2:Open] [N/A:N/A] Measurements L x W x D 1.1x1.1x0.2 [2:0.1x0.1x0.1] [N/A:N/A] (cm) Area (cm) : [1:0.95] [2:0.008] [N/A:N/A] Volume (cm) : [1:0.19] [2:0.001] [N/A:N/A] % Reduction in Area: [1:98.00%] [2:100.00%] [N/A:N/A] % Reduction in Volume: 96.00% [2:100.00%] [N/A:N/A] Classification: [1:Grade 2] [2:Grade 2] [N/A:N/A] Exudate Amount: [1:Large] [2:Large] [N/A:N/A] Exudate Type: [1:Serous] [2:Serous] [N/A:N/A] Exudate Color: [1:amber] [2:amber] [N/A:N/A] Wound Margin: [1:Distinct, outline attached] [2:Distinct, outline attached] [N/A:N/A] Granulation Amount: [1:Medium (34-66%)] [2:Large (67-100%)] [N/A:N/A] Granulation Quality: [1:Red] [2:N/A] [N/A:N/A] Necrotic Amount: [1:Medium (34-66%)] [2:None Present (0%)] [N/A:N/A] Necrotic Tissue: [1:Eschar, Adherent Slough]  [2:N/A] [N/A:N/A] Exposed Structures: [1:Fascia: No Fat Layer (Subcutaneous Tissue) Exposed: No] [2:Fascia: No Fat Layer (Subcutaneous Tissue) Exposed: No] [N/A:N/A] Tendon: No Tendon: No Muscle: No Muscle: No Joint: No Joint: No Bone: No Bone: No Limited to Skin Limited to Skin Breakdown Breakdown Epithelialization: None None N/A Periwound Skin Texture: No Abnormalities Noted Excoriation: No N/A Induration: No Callus: No Crepitus: No Rash: No Scarring: No Periwound Skin No Abnormalities Noted Maceration: No N/A Moisture: Dry/Scaly: No Periwound Skin Color: No Abnormalities Noted Atrophie Blanche: No N/A Cyanosis: No Ecchymosis: No Erythema: No Hemosiderin Staining: No Mottled: No Pallor: No Rubor: No Temperature: No Abnormality No Abnormality N/A Tenderness on Yes Yes N/A Palpation: Wound Preparation: Ulcer Cleansing: Ulcer Cleansing: N/A Rinsed/Irrigated with Rinsed/Irrigated with Saline Saline Topical Anesthetic Topical Anesthetic Applied: Other: lidocaine Applied: Other: lidocaine 4% 4% Treatment Notes Electronic Signature(s) Signed: 08/22/2016 2:19:55 PM By: Evlyn Kanner MD, FACS Entered By: Evlyn Kanner on 08/22/2016 14:19:55 Tamara Campbell ([id number]) -------------------------------------------------------------------------------- Multi-Disciplinary Care Plan Details Patient Name: Tamara Campbell Date of Service: 08/22/2016 1:30 PM Medical Record Number: [id number] Patient Account Number: [id number] Date of Birth/Sex: [birth year] (83 y.o. Female) Treating RN: Curtis Sites Primary Care Carigan Lister: Jodi Mourning Other Clinician: Referring Sacha Radloff: Jodi Mourning Treating Sabria Florido/Extender: Rudene Re in Treatment: 2 Active Inactive ` Abuse / Safety / Falls / Self Care Management Nursing Diagnoses: Potential for falls Goals: Patient will remain injury free Date Initiated: 08/08/2016 Target Resolution Date: 11/12/2016 Goal Status:  Active Interventions: Assess fall risk on admission and as needed Assess impairment of mobility on admission and as needed per policy Notes: ` Nutrition Nursing Diagnoses: Imbalanced nutrition Impaired glucose control: actual or potential Goals: Patient/caregiver agrees to and verbalizes understanding of need to use nutritional supplements and/or vitamins as prescribed Date Initiated: 08/08/2016 Target Resolution Date: 12/10/2016 Goal Status: Active Patient/caregiver verbalizes understanding of need to maintain therapeutic glucose control per primary care physician Date Initiated: 08/08/2016 Target Resolution Date: 12/10/2016 Goal Status: Active Interventions: Assess HgA1c results as ordered upon admission and as needed Assess patient nutrition upon admission and as needed per policy TIYONNA, SARDINHA ([id number]) Notes: ` Orientation to the Wound Care Program Nursing Diagnoses: Knowledge deficit related to the wound healing center program Goals: Patient/caregiver will verbalize understanding of the Wound Healing Center Program Date Initiated: 08/08/2016 Target Resolution Date: 09/10/2016 Goal Status: Active Interventions: Provide education on orientation to the wound center Notes: ` Pain, Acute or Chronic Nursing Diagnoses: Pain, acute or chronic: actual or potential Potential alteration in comfort, pain Goals: Patient/caregiver will verbalize adequate pain control between visits Date Initiated: 08/08/2016 Target Resolution Date: 12/10/2016 Goal Status: Active Interventions: Complete pain assessment as per visit requirements Notes: ` Wound/Skin Impairment Nursing Diagnoses: Impaired tissue integrity Knowledge deficit related to ulceration/compromised skin integrity Goals:  Ulcer/skin breakdown will have a volume reduction of 80% by week 12 Date Initiated: 08/08/2016 Target Resolution Date: 12/10/2016 Goal Status: Active SHANEE, BATCH ([id number]) Interventions: Assess  patient/caregiver ability to perform ulcer/skin care regimen upon admission and as needed Assess ulceration(s) every visit Notes: Electronic Signature(s) Signed: 08/22/2016 4:59:17 PM By: Curtis Sites Entered By: Curtis Sites on 08/22/2016 14:09:50 Tamara Campbell ([id number]) -------------------------------------------------------------------------------- Pain Assessment Details Patient Name: Tamara Campbell. Date of Service: 08/22/2016 1:30 PM Medical Record Number: [id number] Patient Account Number: [id number] Date of Birth/Sex: Dec 17, 1930 (83 y.o. Female) Treating RN: Curtis Sites Primary Care Florentino Laabs: Jodi Mourning Other Clinician: Referring Jorah Hua: Jodi Mourning Treating Herta Hink/Extender: Rudene Re in Treatment: 2 Active Problems Location of Pain Severity and Description of Pain Patient Has Paino Yes Site Locations Pain Location: Pain in Ulcers With Dressing Change: Yes Duration of the Pain. Constant / Intermittento Constant Pain Management and Medication Current Pain Management: Notes Topical or injectable lidocaine is offered to patient for acute pain when surgical debridement is performed. If needed, Patient is instructed to use over the counter pain medication for the following 24-48 hours after debridement. Wound care MDs do not prescribed pain medications. Patient has chronic pain or uncontrolled pain. Patient has been instructed to make an appointment with their Primary Care Physician for pain management. Electronic Signature(s) Signed: 08/22/2016 4:59:17 PM By: Curtis Sites Entered By: Curtis Sites on 08/22/2016 13:39:26 Tamara Campbell ([id number]) -------------------------------------------------------------------------------- Patient/Caregiver Education Details Patient Name: Tamara Campbell Date of Service: 08/22/2016 1:30 PM Medical Record Number: [id number] Patient Account Number: [id number] Date of Birth/Gender: Oct 21, 1930 (83 y.o.  Female) Treating RN: Curtis Sites Primary Care Physician: Jodi Mourning Other Clinician: Referring Physician: Jodi Mourning Treating Physician/Extender: Rudene Re in Treatment: 2 Education Assessment Education Provided To: Patient Education Topics Provided Venous: Handouts: Other: wrap precautions Methods: Explain/Verbal Responses: State content correctly Electronic Signature(s) Signed: 08/22/2016 4:59:17 PM By: Curtis Sites Entered By: Curtis Sites on 08/22/2016 15:07:05 Tamara Campbell ([id number]) -------------------------------------------------------------------------------- Wound Assessment Details Patient Name: Tamara Campbell. Date of Service: 08/22/2016 1:30 PM Medical Record Number: [id number] Patient Account Number: [id number] Date of Birth/Sex: 07-04-1930 (83 y.o. Female) Treating RN: Curtis Sites Primary Care Sheylin Scharnhorst: Jodi Mourning Other Clinician: Referring Nollie Terlizzi: Jodi Mourning Treating Shamal Stracener/Extender: Rudene Re in Treatment: 2 Wound Status Wound Number: 1 Primary Diabetic Wound/Ulcer of the Lower Etiology: Extremity Wound Location: Left Lower Leg - Anterior Wound Open Wounding Event: Blister Status: Date Acquired: 08/01/2016 Comorbid Arrhythmia, Congestive Heart Failure, Weeks Of Treatment: 2 History: Hypertension, Type II Diabetes, Clustered Wound: No Osteoarthritis Photos Photo Uploaded By: Curtis Sites on 08/22/2016 15:00:17 Wound Measurements Length: (cm) 1.1 % Reduction i Width: (cm) 1.1 % Reduction i Depth: (cm) 0.2 Epithelializa Area: (cm) 0.95 Tunneling: Volume: (cm) 0.19 Undermining: n Area: 98% n Volume: 96% tion: None No No Wound Description Classification: Grade 2 Foul Odor Aft Wound Margin: Distinct, outline attached Slough/Fibrin Exudate Amount: Large Exudate Type: Serous Exudate Color: amber er Cleansing: No o No Wound Bed Granulation Amount: Medium (34-66%) Exposed  Structure Granulation Quality: Red Fascia Exposed: No Necrotic Amount: Medium (34-66%) Fat Layer (Subcutaneous Tissue) Exposed: No Necrotic Quality: Eschar, Adherent Slough Tendon Exposed: No Anello, Clary C. ([id number]) Muscle Exposed: No Joint Exposed: No Bone Exposed: No Limited to Skin Breakdown Periwound Skin Texture Texture Color No Abnormalities Noted: No No Abnormalities Noted: No Moisture Temperature / Pain No Abnormalities Noted: No Temperature: No Abnormality Tenderness on Palpation: Yes Wound Preparation Ulcer  Cleansing: Rinsed/Irrigated with Saline Topical Anesthetic Applied: Other: lidocaine 4%, Treatment Notes Wound #1 (Left, Anterior Lower Leg) 1. Cleansed with: Cleanse wound with antibacterial soap and water 2. Anesthetic Topical Lidocaine 4% cream to wound bed prior to debridement 4. Dressing Applied: Aquacel Ag 5. Secondary Dressing Applied ABD Pad 7. Secured with 3 Layer Compression System - Left Lower Extremity Electronic Signature(s) Signed: 08/22/2016 4:59:17 PM By: Curtis Sitesorthy, Joanna Entered By: Curtis Sitesorthy, Joanna on 08/22/2016 14:11:06 Tamara HoitWALKER, Zipporah C. ([id number]) -------------------------------------------------------------------------------- Wound Assessment Details Patient Name: Tamara HoitWALKER, Coryn C. Date of Service: 08/22/2016 1:30 PM Medical Record Number: [id number] Patient Account Number: [id number] Date of Birth/Sex: 12/27/1930 (83 y.o. Female) Treating RN: Curtis Sitesorthy, Joanna Primary Care Marqus Macphee: Jodi MourningWalker III, John Other Clinician: Referring Ceili Boshers: Jodi MourningWalker III, John Treating Lincoln Ginley/Extender: Rudene ReBritto, Errol Weeks in Treatment: 2 Wound Status Wound Number: 2 Primary Diabetic Wound/Ulcer of the Lower Etiology: Extremity Wound Location: Right, Anterior Lower Leg Wound Open Wounding Event: Blister Status: Date Acquired: 08/01/2016 Comorbid Arrhythmia, Congestive Heart Failure, Weeks Of Treatment: 2 History: Hypertension, Type II  Diabetes, Clustered Wound: No Osteoarthritis Photos Photo Uploaded By: Curtis Sitesorthy, Joanna on 08/22/2016 15:00:17 Wound Measurements Length: (cm) 0.1 Width: (cm) 0.1 Depth: (cm) 0.1 Area: (cm) 0.008 Volume: (cm) 0.001 % Reduction in Area: 100% % Reduction in Volume: 100% Epithelialization: None Tunneling: No Undermining: No Wound Description Classification: Grade 2 Foul Odor Afte Wound Margin: Distinct, outline attached Slough/Fibrino Exudate Amount: Large Exudate Type: Serous Exudate Color: amber r Cleansing: No No Wound Bed Granulation Amount: Large (67-100%) Exposed Structure Necrotic Amount: None Present (0%) Fascia Exposed: No Fat Layer (Subcutaneous Tissue) Exposed: No Tendon Exposed: No Convey, Camyra C. ([id number]) Muscle Exposed: No Joint Exposed: No Bone Exposed: No Limited to Skin Breakdown Periwound Skin Texture Texture Color No Abnormalities Noted: No No Abnormalities Noted: No Callus: No Atrophie Blanche: No Crepitus: No Cyanosis: No Excoriation: No Ecchymosis: No Induration: No Erythema: No Rash: No Hemosiderin Staining: No Scarring: No Mottled: No Pallor: No Moisture Rubor: No No Abnormalities Noted: No Dry / Scaly: No Temperature / Pain Maceration: No Temperature: No Abnormality Tenderness on Palpation: Yes Wound Preparation Ulcer Cleansing: Rinsed/Irrigated with Saline Topical Anesthetic Applied: Other: lidocaine 4%, Electronic Signature(s) Signed: 08/22/2016 4:59:17 PM By: Curtis Sitesorthy, Joanna Entered By: Curtis Sitesorthy, Joanna on 08/22/2016 14:12:11 Tamara HoitWALKER, Larry C. ([id number]) -------------------------------------------------------------------------------- Vitals Details Patient Name: Tamara HoitWALKER, Tinley C. Date of Service: 08/22/2016 1:30 PM Medical Record Number: [id number] Patient Account Number: [id number] Date of Birth/Sex: 05/10/1931 (83 y.o. Female) Treating RN: Curtis Sitesorthy, Joanna Primary Care Sia Gabrielsen: Jodi MourningWalker III, John Other  Clinician: Referring Britton Bera: Jodi MourningWalker III, John Treating Lennan Malone/Extender: Rudene ReBritto, Errol Weeks in Treatment: 2 Vital Signs Time Taken: 13:39 Temperature (F): 97.5 Height (in): 62 Pulse (bpm): 71 Weight (lbs): 130 Respiratory Rate (breaths/min): 18 Body Mass Index (BMI): 23.8 Blood Pressure (mmHg): 122/58 Reference Range: 80 - 120 mg / dl Electronic Signature(s) Signed: 08/22/2016 4:59:17 PM By: Curtis Sitesorthy, Joanna Entered By: Curtis Sitesorthy, Joanna on 08/22/2016 13:39:46

## 2016-08-29 ENCOUNTER — Encounter: Payer: Medicare HMO | Admitting: Surgery

## 2016-08-29 DIAGNOSIS — E11622 Type 2 diabetes mellitus with other skin ulcer: Secondary | ICD-10-CM | POA: Diagnosis not present

## 2016-08-29 DIAGNOSIS — I429 Cardiomyopathy, unspecified: Secondary | ICD-10-CM | POA: Diagnosis not present

## 2016-08-29 DIAGNOSIS — I4891 Unspecified atrial fibrillation: Secondary | ICD-10-CM | POA: Diagnosis not present

## 2016-08-29 DIAGNOSIS — L97222 Non-pressure chronic ulcer of left calf with fat layer exposed: Secondary | ICD-10-CM | POA: Diagnosis not present

## 2016-08-29 DIAGNOSIS — I110 Hypertensive heart disease with heart failure: Secondary | ICD-10-CM | POA: Diagnosis not present

## 2016-08-29 DIAGNOSIS — L97221 Non-pressure chronic ulcer of left calf limited to breakdown of skin: Secondary | ICD-10-CM | POA: Diagnosis not present

## 2016-08-29 DIAGNOSIS — L97211 Non-pressure chronic ulcer of right calf limited to breakdown of skin: Secondary | ICD-10-CM | POA: Diagnosis not present

## 2016-08-29 DIAGNOSIS — I509 Heart failure, unspecified: Secondary | ICD-10-CM | POA: Diagnosis not present

## 2016-08-29 DIAGNOSIS — I890 Lymphedema, not elsewhere classified: Secondary | ICD-10-CM | POA: Diagnosis not present

## 2016-08-29 DIAGNOSIS — Z950 Presence of cardiac pacemaker: Secondary | ICD-10-CM | POA: Diagnosis not present

## 2016-08-29 NOTE — Progress Notes (Signed)
KERIGAN, NARVAEZ ([id number]) Visit Report for 08/29/2016 Chief Complaint Document Details Patient Name: Tamara Campbell, Tamara Campbell. Date of Service: 08/29/2016 12:30 PM Medical Record Number: [id number] Patient Account Number: [id number] Date of Birth/Sex: Jan 25, 1931 (83 y.o. Female) Treating RN: Ashok Cordia, Debi Primary Care Provider: Jodi Mourning Other Clinician: Referring Provider: Jodi Mourning Treating Provider/Extender: Rudene Re in Treatment: 3 Information Obtained from: Patient Chief Complaint Patient presents to the wound care center for a consult due non healing wound both lower extremities with swelling for the last 2 weeks Electronic Signature(s) Signed: 08/29/2016 1:26:56 PM By: Evlyn Kanner MD, FACS Entered By: Evlyn Kanner on 08/29/2016 13:26:55 Tamara Campbell ([id number]) -------------------------------------------------------------------------------- Debridement Details Patient Name: Tamara Campbell. Date of Service: 08/29/2016 12:30 PM Medical Record Number: [id number] Patient Account Number: [id number] Date of Birth/Sex: [birth year] (83 y.o. Female) Treating RN: Ashok Cordia, Debi Primary Care Provider: Jodi Mourning Other Clinician: Referring Provider: Jodi Mourning Treating Provider/Extender: Rudene Re in Treatment: 3 Debridement Performed for Wound #1 Left,Anterior Lower Leg Assessment: Performed By: Physician Evlyn Kanner, MD Debridement: Debridement Pre-procedure Yes - 13:05 Verification/Time Out Taken: Start Time: 13:06 Pain Control: Lidocaine 4% Topical Solution Level: Skin/Subcutaneous Tissue Total Area Debrided (L x 1 (cm) x 1 (cm) = 1 (cm) W): Tissue and other Viable, Non-Viable, Exudate, Fibrin/Slough, Subcutaneous material debrided: Instrument: Forceps Bleeding: Minimum Hemostasis Achieved: Pressure End Time: 13:08 Procedural Pain: 0 Post Procedural Pain: 0 Response to Treatment: Procedure was tolerated well Post  Debridement Measurements of Total Wound Length: (cm) 1.5 Width: (cm) 1 Depth: (cm) 0.2 Volume: (cm) 0.236 Character of Wound/Ulcer Post Requires Further Debridement Debridement: Severity of Tissue Post Debridement: Fat layer exposed Post Procedure Diagnosis Same as Pre-procedure Electronic Signature(s) Signed: 08/29/2016 1:26:46 PM By: Evlyn Kanner MD, FACS Signed: 08/29/2016 4:13:03 PM By: Alejandro Mulling Entered By: Evlyn Kanner on 08/29/2016 13:26:46 Tamara Campbell, Tamara Campbell ([id number]) Tamara Campbell, Tamara Campbell ([id number]) -------------------------------------------------------------------------------- HPI Details Patient Name: Tamara Campbell. Date of Service: 08/29/2016 12:30 PM Medical Record Number: [id number] Patient Account Number: [id number] Date of Birth/Sex: April 11, 1931 (83 y.o. Female) Treating RN: Ashok Cordia, Debi Primary Care Provider: Jodi Mourning Other Clinician: Referring Provider: Jodi Mourning Treating Provider/Extender: Rudene Re in Treatment: 3 History of Present Illness Location: both lower extremities Quality: Patient reports No Pain. Severity: Patient states wound (s) are getting better. Duration: Patient has had the wound for < 2 weeks prior to presenting for treatment Context: The wound appeared gradually over time Modifying Factors: Other treatment(s) tried include:Silvadene cream and compression wraps Associated Signs and Symptoms: Patient reports having increase swelling. HPI Description: 83 year old patient who has been sent to see [place] by her PCP Dr. Yates Decamp, for open wounds on her left lower extremity, limited to breakdown of skin, possibly due to venous stasis ulcer, has a past medical history of diabetes mellitus type 2, status post cardiac pacemaker, essential hypertension, atrial fibrillation, CHF, cardiomyopathy, ventricular tachycardia, status post right total knee arthroplasty in 2014, back surgery in 1996, hysterectomy. She has never  been a smoker. her PCP found that she had stasis edema of both lower extremities left worse than right and had ulceration on the lateral aspect of her left leg. Last hemoglobin A1c done and February of this year was 6.3% patient's daughter tells me that she has seen at [facility] for an opinion about a year ago and hence we will try and obtain these reports regarding the workup done and the recommendations made. 08/22/2016 -- -- the  patient was seen by Dr. Gilda Crease on 06/22/2015 and after examination he made a note of her atherosclerosis of the lower extremities with ulceration and recommended noninvasive studies. The patient was seen back in follow-up on 07/06/2015 and her right ABI was 1.13 and the left was 1.20 with triphasic pedal signals bilaterally. Duplex ultrasound of the left lower extremity showed SFA, popliteal and tibial arteries with diffuse disease but no focal stenosis and triphasic. His impression was since these arterial studies were normal he thought the problem for the ulceration was more venous then a peripheral arterial disease. Commended to continue with graduated compression and follow back in 3 months. Electronic Signature(s) Signed: 08/29/2016 1:27:08 PM By: Evlyn Kanner MD, FACS Entered By: Evlyn Kanner on 08/29/2016 13:27:07 Tamara Campbell ([id number]) -------------------------------------------------------------------------------- Physical Exam Details Patient Name: Tamara Campbell Date of Service: 08/29/2016 12:30 PM Medical Record Number: [id number] Patient Account Number: [id number] Date of Birth/Sex: Jul 30, 1930 (83 y.o. Female) Treating RN: Ashok Cordia, Debi Primary Care Provider: Jodi Mourning Other Clinician: Referring Provider: Jodi Mourning Treating Provider/Extender: Rudene Re in Treatment: 3 Constitutional . Pulse regular. Respirations normal and unlabored. Afebrile. . Eyes Nonicteric. Reactive to light. Ears, Nose,  Mouth, and Throat Lips, teeth, and gums WNL.Marland Kitchen Moist mucosa without lesions. Neck supple and nontender. No palpable supraclavicular or cervical adenopathy. Normal sized without goiter. Respiratory WNL. No retractions.. Breath sounds WNL, No rubs, rales, rhonchi, or wheeze.. Cardiovascular Heart rhythm and rate regular, no murmur or gallop.. Pedal Pulses WNL. No clubbing, cyanosis or edema. Chest Breasts symmetical and no nipple discharge.. Breast tissue WNL, no masses, lumps, or tenderness.. Lymphatic No adneopathy. No adenopathy. No adenopathy. Musculoskeletal Adexa without tenderness or enlargement.. Digits and nails w/o clubbing, cyanosis, infection, petechiae, ischemia, or inflammatory conditions.. Integumentary (Hair, Skin) No suspicious lesions. No crepitus or fluctuance. No peri-wound warmth or erythema. No masses.Marland Kitchen Psychiatric Judgement and insight Intact.. No evidence of depression, anxiety, or agitation.. Notes the edema of the right lower extremity has gone down significantly and after sharply room in some of the debris that is good resolution in the size of the wound and the base of the ulcer is looking Programme researcher, broadcasting/film/video Signature(s) Signed: 08/29/2016 1:27:38 PM By: Evlyn Kanner MD, FACS Entered By: Evlyn Kanner on 08/29/2016 13:27:37 Tamara Campbell ([id number]) -------------------------------------------------------------------------------- Physician Orders Details Patient Name: Tamara Campbell. Date of Service: 08/29/2016 12:30 PM Medical Record Number: [id number] Patient Account Number: [id number] Date of Birth/Sex: 05-Sep-1930 (83 y.o. Female) Treating RN: Ashok Cordia, Debi Primary Care Provider: Jodi Mourning Other Clinician: Referring Provider: Jodi Mourning Treating Provider/Extender: Rudene Re in Treatment: 3 Verbal / Phone Orders: Yes Clinician: Pinkerton, Debi Read Back and Verified: Yes Diagnosis Coding Wound Cleansing Wound #1  Left,Anterior Lower Leg o Clean wound with Normal Saline. o May Shower, gently pat wound dry prior to applying new dressing. Anesthetic Wound #1 Left,Anterior Lower Leg o Topical Lidocaine 4% cream applied to wound bed prior to debridement - clinic use only Skin Barriers/Peri-Wound Care Wound #1 Left,Anterior Lower Leg o Moisturizing lotion Wound #2 Right,Anterior Lower Leg o Moisturizing lotion Primary Wound Dressing Wound #1 Left,Anterior Lower Leg o Hydrafera Blue Secondary Dressing Wound #1 Left,Anterior Lower Leg o ABD pad Dressing Change Frequency Wound #1 Left,Anterior Lower Leg o Change dressing every week Follow-up Appointments Wound #1 Left,Anterior Lower Leg o Return Appointment in 1 week. Wound #2 Right,Anterior Lower Leg o Return Appointment in 1 week. LYZA, HOUSEWORTH ([id number]) Edema Control Wound #1 Left,Anterior Lower Leg   o 3 Layer Compression System - Left Lower Extremity - unna to anchor o Elevate legs to the level of the heart and pump ankles as often as possible Wound #2 Right,Anterior Lower Leg o Patient to wear own compression stockings Additional Orders / Instructions Wound #1 Left,Anterior Lower Leg o Increase protein intake. Wound #2 Right,Anterior Lower Leg o Increase protein intake. Electronic Signature(s) Signed: 08/29/2016 4:09:48 PM By: Evlyn KannerBritto, Johnathyn Viscomi MD, FACS Signed: 08/29/2016 4:13:03 PM By: Alejandro MullingPinkerton, Debra Entered By: Alejandro MullingPinkerton, Debra on 08/29/2016 13:25:40 Tamara HoitWALKER, Tamara C. ([id number]) -------------------------------------------------------------------------------- Problem List Details Patient Name: Tamara HoitWALKER, Tamara C. Date of Service: 08/29/2016 12:30 PM Medical Record Number: [id number] Patient Account Number: [id number] Date of Birth/Sex: 07/18/1930 (83 y.o. Female) Treating RN: Ashok CordiaPinkerton, Debi Primary Care Provider: Jodi MourningWalker III, John Other Clinician: Referring Provider: Jodi MourningWalker III, John Treating  Provider/Extender: Rudene ReBritto, Benisha Hadaway Weeks in Treatment: 3 Active Problems ICD-10 Encounter Code Description Active Date Diagnosis E11.622 Type 2 diabetes mellitus with other skin ulcer 08/08/2016 Yes I89.0 Lymphedema, not elsewhere classified 08/08/2016 Yes L97.221 Non-pressure chronic ulcer of left calf limited to 08/08/2016 Yes breakdown of skin L97.211 Non-pressure chronic ulcer of right calf limited to 08/08/2016 Yes breakdown of skin Inactive Problems Resolved Problems Electronic Signature(s) Signed: 08/29/2016 1:26:28 PM By: Evlyn KannerBritto, Mykeal Carrick MD, FACS Entered By: Evlyn KannerBritto, Makynzie Dobesh on 08/29/2016 13:26:28 Tamara HoitWALKER, Tamara C. ([id number]) -------------------------------------------------------------------------------- Progress Note Details Patient Name: Tamara HoitWALKER, Beonka C. Date of Service: 08/29/2016 12:30 PM Medical Record Number: [id number] Patient Account Number: [id number] Date of Birth/Sex: 02/01/1931 (83 y.o. Female) Treating RN: Ashok CordiaPinkerton, Debi Primary Care Provider: Jodi MourningWalker III, John Other Clinician: Referring Provider: Jodi MourningWalker III, John Treating Provider/Extender: Rudene ReBritto, Diallo Ponder Weeks in Treatment: 3 Subjective Chief Complaint Information obtained from Patient Patient presents to the wound care center for a consult due non healing wound both lower extremities with swelling for the last 2 weeks History of Present Illness (HPI) The following HPI elements were documented for the patient's wound: Location: both lower extremities Quality: Patient reports No Pain. Severity: Patient states wound (s) are getting better. Duration: Patient has had the wound for < 2 weeks prior to presenting for treatment Context: The wound appeared gradually over time Modifying Factors: Other treatment(s) tried include:Silvadene cream and compression wraps Associated Signs and Symptoms: Patient reports having increase swelling. 83 year old patient who has been sent to see us by her PCP Dr. Yates DecampJohn Hancock, for open wounds  on her left lower extremity, limited to breakdown of skin, possibly due to venous stasis ulcer, has a past medical history of diabetes mellitus type 2, status post cardiac pacemaker, essential hypertension, atrial fibrillation, CHF, cardiomyopathy, ventricular tachycardia, status post right total knee arthroplasty in 2014, back surgery in 1996, hysterectomy. She has never been a smoker. her PCP found that she had stasis edema of both lower extremities left worse than right and had ulceration on the lateral aspect of her left leg. Last hemoglobin A1c done and February of this year was 6.3% patient's daughter tells me that she has seen at [facility] for an opinion about a year ago and hence we will try and obtain these reports regarding the workup done and the recommendations made. 08/22/2016 -- -- the patient was seen by Dr. Gilda CreaseSchnier on 06/22/2015 and after examination he made a note of her atherosclerosis of the lower extremities with ulceration and recommended noninvasive studies. The patient was seen back in follow-up on 07/06/2015 and her right ABI was 1.13 and the left was 1.20 with triphasic pedal signals bilaterally. Duplex ultrasound of the  left lower extremity showed SFA, popliteal and tibial arteries with diffuse disease but no focal stenosis and triphasic. His impression was since these arterial studies were normal he thought the problem for the ulceration was more venous then a peripheral arterial disease. Commended to continue with graduated compression and follow back in 3 months. Tamara Campbell, Tamara Campbell ([id number]) Objective Constitutional Pulse regular. Respirations normal and unlabored. Afebrile. Vitals Time Taken: 12:44 PM, Height: 62 in, Weight: 130 lbs, BMI: 23.8, Temperature: 97.5 F, Pulse: 73 bpm, Respiratory Rate: 18 breaths/min, Blood Pressure: 140/85 mmHg. Eyes Nonicteric. Reactive to light. Ears, Nose, Mouth, and Throat Lips, teeth, and gums WNL.Marland Kitchen Moist  mucosa without lesions. Neck supple and nontender. No palpable supraclavicular or cervical adenopathy. Normal sized without goiter. Respiratory WNL. No retractions.. Breath sounds WNL, No rubs, rales, rhonchi, or wheeze.. Cardiovascular Heart rhythm and rate regular, no murmur or gallop.. Pedal Pulses WNL. No clubbing, cyanosis or edema. Chest Breasts symmetical and no nipple discharge.. Breast tissue WNL, no masses, lumps, or tenderness.. Lymphatic No adneopathy. No adenopathy. No adenopathy. Musculoskeletal Adexa without tenderness or enlargement.. Digits and nails w/o clubbing, cyanosis, infection, petechiae, ischemia, or inflammatory conditions.Marland Kitchen Psychiatric Judgement and insight Intact.. No evidence of depression, anxiety, or agitation.. General Notes: the edema of the right lower extremity has gone down significantly and after sharply room in some of the debris that is good resolution in the size of the wound and the base of the ulcer is looking cleaner Integumentary (Hair, Skin) No suspicious lesions. No crepitus or fluctuance. No peri-wound warmth or erythema. No masses.. Wound #1 status is Open. Original cause of wound was Blister. The wound is located on the Left,Anterior Lower Leg. The wound measures 1cm length x 1cm width x 0.2cm depth; 0.785cm^2 area and 0.157cm^3 Tamara Campbell, Tamara C. ([id number]) volume. The wound is limited to skin breakdown. There is no tunneling or undermining noted. There is a large amount of serosanguineous drainage noted. The wound margin is distinct with the outline attached to the wound base. There is small (1-33%) red granulation within the wound bed. There is a large (67-100%) amount of necrotic tissue within the wound bed including Eschar and Adherent Slough. Periwound temperature was noted as No Abnormality. The periwound has tenderness on palpation. Wound #2 status is Open. Original cause of wound was Blister. The wound is located on the  Right,Anterior Lower Leg. The wound measures 0.1cm length x 0.1cm width x 0.1cm depth; 0.008cm^2 area and 0.001cm^3 volume. The wound is limited to skin breakdown. There is no tunneling or undermining noted. There is a none present amount of drainage noted. The wound margin is distinct with the outline attached to the wound base. There is large (67-100%) granulation within the wound bed. There is no necrotic tissue within the wound bed. The periwound skin appearance did not exhibit: Callus, Crepitus, Excoriation, Induration, Rash, Scarring, Dry/Scaly, Maceration, Atrophie Blanche, Cyanosis, Ecchymosis, Hemosiderin Staining, Mottled, Pallor, Rubor, Erythema. Periwound temperature was noted as No Abnormality. The periwound has tenderness on palpation. Assessment Active Problems ICD-10 E11.622 - Type 2 diabetes mellitus with other skin ulcer I89.0 - Lymphedema, not elsewhere classified L97.221 - Non-pressure chronic ulcer of left calf limited to breakdown of skin L97.211 - Non-pressure chronic ulcer of right calf limited to breakdown of skin Procedures Wound #1 Wound #1 is a Diabetic Wound/Ulcer of the Lower Extremity located on the Left,Anterior Lower Leg . There was a Skin/Subcutaneous Tissue Debridement ([id number]) debridement with total area of 1 sq cm performed by Evlyn Kanner,  MD. with the following instrument(s): Forceps to remove Viable and Non-Viable tissue/material including Exudate, Fibrin/Slough, and Subcutaneous after achieving pain control using Lidocaine 4% Topical Solution. A time out was conducted at 13:05, prior to the start of the procedure. A Minimum amount of bleeding was controlled with Pressure. The procedure was tolerated well with a pain level of 0 throughout and a pain level of 0 following the procedure. Post Debridement Measurements: 1.5cm length x 1cm width x 0.2cm depth; 0.236cm^3 volume. Character of Wound/Ulcer Post Debridement requires further debridement.  Severity of Tissue Post Debridement is: Fat layer exposed. Post procedure Diagnosis Wound #1: Same as Pre-Procedure Tamara Campbell, Tamara C. ([id number]) Plan Wound Cleansing: Wound #1 Left,Anterior Lower Leg: Clean wound with Normal Saline. May Shower, gently pat wound dry prior to applying new dressing. Anesthetic: Wound #1 Left,Anterior Lower Leg: Topical Lidocaine 4% cream applied to wound bed prior to debridement - clinic use only Skin Barriers/Peri-Wound Care: Wound #1 Left,Anterior Lower Leg: Moisturizing lotion Wound #2 Right,Anterior Lower Leg: Moisturizing lotion Primary Wound Dressing: Wound #1 Left,Anterior Lower Leg: Hydrafera Blue Secondary Dressing: Wound #1 Left,Anterior Lower Leg: ABD pad Dressing Change Frequency: Wound #1 Left,Anterior Lower Leg: Change dressing every week Follow-up Appointments: Wound #1 Left,Anterior Lower Leg: Return Appointment in 1 week. Wound #2 Right,Anterior Lower Leg: Return Appointment in 1 week. Edema Control: Wound #1 Left,Anterior Lower Leg: 3 Layer Compression System - Left Lower Extremity - unna to anchor Elevate legs to the level of the heart and pump ankles as often as possible Wound #2 Right,Anterior Lower Leg: Patient to wear own compression stockings Additional Orders / Instructions: Wound #1 Left,Anterior Lower Leg: Increase protein intake. Wound #2 Right,Anterior Lower Leg: Increase protein intake. After review I have recommended: Tamara Campbell, Tamara C. ([id number]) 1. Hydrofera blue with a 3 layer Profore to be applied to the left lower extremities and keep it unchanged for the week. 2. juxta light compressions to be used on the right lower extremity and we will order these for bilateral lower legs. 3. elevation and exercise discussed with her in detail. 4. Regular visits the wound center The patient's daughter and daughter-in-law both at the bedside have had all questions answered Electronic Signature(s) Signed: 08/29/2016  1:29:04 PM By: Evlyn Kanner MD, FACS Entered By: Evlyn Kanner on 08/29/2016 13:29:04 Tamara Campbell ([id number]) -------------------------------------------------------------------------------- SuperBill Details Patient Name: Tamara Campbell. Date of Service: 08/29/2016 Medical Record Number: [id number] Patient Account Number: [id number] Date of Birth/Sex: 13-Feb-1931 (83 y.o. Female) Treating RN: Ashok Cordia, Debi Primary Care Provider: Jodi Mourning Other Clinician: Referring Provider: Jodi Mourning Treating Provider/Extender: Evlyn Kanner Service Line: Outpatient Weeks in Treatment: 3 Diagnosis Coding ICD-10 Codes Code Description E11.622 Type 2 diabetes mellitus with other skin ulcer I89.0 Lymphedema, not elsewhere classified L97.221 Non-pressure chronic ulcer of left calf limited to breakdown of skin L97.211 Non-pressure chronic ulcer of right calf limited to breakdown of skin Facility Procedures CPT4 Code Description: [id number] 11042 - DEB SUBQ TISSUE 20 SQ CM/< ICD-10 Description Diagnosis E11.622 Type 2 diabetes mellitus with other skin ulcer I89.0 Lymphedema, not elsewhere classified L97.221 Non-pressure chronic ulcer of left calf limited to Modifier: breakdown of s Quantity: 1 kin Physician Procedures CPT4 Code Description: [id number] 11042 - WC PHYS SUBQ TISS 20 SQ CM ICD-10 Description Diagnosis E11.622 Type 2 diabetes mellitus with other skin ulcer I89.0 Lymphedema, not elsewhere classified L97.221 Non-pressure chronic ulcer of left calf limited to Modifier: breakdown of s Quantity: 1 kin Electronic Signature(s) Signed: 08/29/2016 1:29:41 PM By: Meyer Russel,  Glory Buff MD, FACS Previous Signature: 08/29/2016 1:29:32 PM Version By: Evlyn Kanner MD, FACS Entered By: Evlyn Kanner on 08/29/2016 13:29:40

## 2016-08-29 NOTE — Progress Notes (Signed)
JAMECIA, LERMAN ([id number]) Visit Report for 08/29/2016 Arrival Information Details Patient Name: Tamara Campbell, Tamara Campbell. Date of Service: 08/29/2016 12:30 PM Medical Record Number: [id number] Patient Account Number: [id number] Date of Birth/Sex: May 07, 1931 (83 y.o. Female) Treating RN: Ashok Cordia, Debi Primary Care Ninfa Giannelli: Jodi Mourning Other Clinician: Referring Jeriah Corkum: Jodi Mourning Treating Dalyah Pla/Extender: Rudene Re in Treatment: 3 Visit Information History Since Last Visit All ordered tests and consults were completed: No Patient Arrived: Quinones Added or deleted any medications: No Arrival Time: 12:41 Any new allergies or adverse reactions: No Accompanied By: daughter Had a fall or experienced change in No Transfer Assistance: EasyPivot Patient activities of daily living that may affect Lift risk of falls: Patient Identification Verified: Yes Signs or symptoms of abuse/neglect since last No Secondary Verification Process Yes visito Completed: Hospitalized since last visit: No Patient Requires No Has Dressing in Place as Prescribed: Yes Transmission-Based Precautions: Has Compression in Place as Prescribed: Yes Patient Has Alerts: Yes Pain Present Now: No Patient Alerts: Patient on Blood Thinner Eliquis DM II R ABI noncompressible Electronic Signature(s) Signed: 08/29/2016 4:13:03 PM By: Alejandro Mulling Entered By: Alejandro Mulling on 08/29/2016 12:44:03 Tamara Campbell ([id number]) -------------------------------------------------------------------------------- Encounter Discharge Information Details Patient Name: Tamara Campbell. Date of Service: 08/29/2016 12:30 PM Medical Record Number: [id number] Patient Account Number: [id number] Date of Birth/Sex: Apr 14, 1931 (84 y.o. Female) Treating RN: Ashok Cordia, Debi Primary Care Denia Mcvicar: Jodi Mourning Other Clinician: Referring Kern Gingras: Jodi Mourning Treating Amen Staszak/Extender: Rudene Re in Treatment: 3 Encounter Discharge Information Items Discharge Pain Level: 0 Discharge Condition: Stable Ambulatory Status: Klemp Discharge Destination: Home Transportation: Private Auto Accompanied By: daughter Schedule Follow-up Appointment: Yes Medication Reconciliation completed No and provided to Patient/Care Alys Dulak: Provided on Clinical Summary of Care: 08/29/2016 Form Type Recipient Paper Patient AW Electronic Signature(s) Signed: 08/29/2016 1:25:18 PM By: Gwenlyn Perking Entered By: Gwenlyn Perking on 08/29/2016 13:25:18 Tamara Campbell ([id number]) -------------------------------------------------------------------------------- Lower Extremity Assessment Details Patient Name: Tamara Campbell Date of Service: 08/29/2016 12:30 PM Medical Record Number: [id number] Patient Account Number: [id number] Date of Birth/Sex: 23-Oct-1930 (83 y.o. Female) Treating RN: Ashok Cordia, Debi Primary Care Karista Aispuro: Jodi Mourning Other Clinician: Referring Sibley Rolison: Jodi Mourning Treating Carlitos Bottino/Extender: Rudene Re in Treatment: 3 Edema Assessment Assessed: [Left: No] [Right: No] E[Left: dema] [Right: :] Calf Left: Right: Point of Measurement: 32 cm From Medial Instep 31.2 cm cm Ankle Left: Right: Point of Measurement: 9 cm From Medial Instep 21.1 cm cm Vascular Assessment Pulses: Dorsalis Pedis Palpable: [Left:Yes] Posterior Tibial Extremity colors, hair growth, and conditions: Extremity Color: [Left:Hyperpigmented] Temperature of Extremity: [Left:Warm] Capillary Refill: [Left:< 3 seconds] Toe Nail Assessment Left: Right: Thick: Yes Discolored: Yes Deformed: No Improper Length and Hygiene: Yes Electronic Signature(s) Signed: 08/29/2016 4:13:03 PM By: Alejandro Mulling Entered By: Alejandro Mulling on 08/29/2016 12:58:16 Gorin, Arlis Porta ([id number]) -------------------------------------------------------------------------------- Multi Wound Chart  Details Patient Name: Tamara Campbell. Date of Service: 08/29/2016 12:30 PM Medical Record Number: [id number] Patient Account Number: [id number] Date of Birth/Sex: June 21, 1930 (83 y.o. Female) Treating RN: Ashok Cordia, Debi Primary Care [employer]: Jodi Mourning Other Clinician: Referring Nikesh Teschner: Jodi Mourning Treating Allona Gondek/Extender: Rudene Re in Treatment: 3 Vital Signs Height(in): 62 Pulse(bpm): 73 Weight(lbs): 130 Blood Pressure 140/85 (mmHg): Body Mass Index(BMI): 24 Temperature(F): 97.5 Respiratory Rate 18 (breaths/min): Photos: [1:No Photos] [2:No Photos] [N/A:N/A] Wound Location: [1:Left Lower Leg - Anterior] [2:Right Lower Leg - Anterior N/A] Wounding Event: [1:Blister] [2:Blister] [N/A:N/A] Primary Etiology: [1:Diabetic Wound/Ulcer of the Lower Extremity] [2:Diabetic Wound/Ulcer  of N/A the Lower Extremity] Comorbid History: [1:Arrhythmia, Congestive Heart Failure, Hypertension, Type II Diabetes, Osteoarthritis] [2:Arrhythmia, Congestive N/A Heart Failure, Hypertension, Type II Diabetes, Osteoarthritis] Date Acquired: [1:08/01/2016] [2:08/01/2016] [N/A:N/A] Weeks of Treatment: [1:3] [2:3] [N/A:N/A] Wound Status: [1:Open] [2:Open] [N/A:N/A] Measurements L x W x D 1x1x0.2 [2:0.1x0.1x0.1] [N/A:N/A] (cm) Area (cm) : [1:0.785] [2:0.008] [N/A:N/A] Volume (cm) : [1:0.157] [2:0.001] [N/A:N/A] % Reduction in Area: [1:98.40%] [2:100.00%] [N/A:N/A] % Reduction in Volume: 96.70% [2:100.00%] [N/A:N/A] Classification: [1:Grade 2] [2:Grade 2] [N/A:N/A] Exudate Amount: [1:Large] [2:None Present] [N/A:N/A] Exudate Type: [1:Serosanguineous] [2:N/A] [N/A:N/A] Exudate Color: [1:red, brown] [2:N/A] [N/A:N/A] Wound Margin: [1:Distinct, outline attached] [2:Distinct, outline attached] [N/A:N/A] Granulation Amount: [1:Small (1-33%)] [2:Large (67-100%)] [N/A:N/A] Granulation Quality: [1:Red] [2:N/A] [N/A:N/A] Necrotic Amount: [1:Large (67-100%)] [2:None Present (0%)]  [N/A:N/A] Necrotic Tissue: [1:Eschar, Adherent Slough] [2:N/A] [N/A:N/A] Exposed Structures: [1:Fascia: No Fat Layer (Subcutaneous Tissue) Exposed: No] [2:Fascia: No Fat Layer (Subcutaneous Tissue) Exposed: No] [N/A:N/A] Tendon: No Tendon: No Muscle: No Muscle: No Joint: No Joint: No Bone: No Bone: No Limited to Skin Limited to Skin Breakdown Breakdown Epithelialization: None Large (67-100%) N/A Debridement: Debridement (16109- N/A N/A 11047) Pre-procedure 13:05 N/A N/A Verification/Time Out Taken: Pain Control: Lidocaine 4% Topical N/A N/A Solution Tissue Debrided: Fibrin/Slough, Exudates, N/A N/A Subcutaneous Level: Skin/Subcutaneous N/A N/A Tissue Debridement Area (sq 1 N/A N/A cm): Instrument: Forceps N/A N/A Bleeding: Minimum N/A N/A Hemostasis Achieved: Pressure N/A N/A Procedural Pain: 0 N/A N/A Post Procedural Pain: 0 N/A N/A Debridement Treatment Procedure was tolerated N/A N/A Response: well Post Debridement 1.5x1x0.2 N/A N/A Measurements L x W x D (cm) Post Debridement 0.236 N/A N/A Volume: (cm) Periwound Skin Texture: No Abnormalities Noted Excoriation: No N/A Induration: No Callus: No Crepitus: No Rash: No Scarring: No Periwound Skin No Abnormalities Noted Maceration: No N/A Moisture: Dry/Scaly: No Periwound Skin Color: No Abnormalities Noted Atrophie Blanche: No N/A Cyanosis: No Ecchymosis: No Erythema: No Hemosiderin Staining: No Mottled: No Pallor: No Rubor: No Temperature: No Abnormality No Abnormality N/A Yes Yes N/A Kriesel, Airis C. ([id number]) Tenderness on Palpation: Wound Preparation: Ulcer Cleansing: Ulcer Cleansing: N/A Rinsed/Irrigated with Rinsed/Irrigated with Saline Saline Topical Anesthetic Topical Anesthetic Applied: Other: lidocaine Applied: None 4% Procedures Performed: Debridement N/A N/A Treatment Notes Wound #1 (Left, Anterior Lower Leg) 1. Cleansed with: Clean wound with Normal Saline Cleanse wound with  antibacterial soap and water 2. Anesthetic Topical Lidocaine 4% cream to wound bed prior to debridement 4. Dressing Applied: Hydrafera Blue 5. Secondary Dressing Applied ABD Pad 7. Secured with Tape 3 Layer Compression System - Left Lower Extremity Notes unna to anchor Wound #2 (Right, Anterior Lower Leg) 1. Cleansed with: Clean wound with Normal Saline 3. Peri-wound Care: Moisturizing lotion Electronic Signature(s) Signed: 08/29/2016 1:26:34 PM By: Evlyn Kanner MD, FACS Entered By: Evlyn Kanner on 08/29/2016 13:26:34 Tamara Campbell ([id number]) -------------------------------------------------------------------------------- Multi-Disciplinary Care Plan Details Patient Name: ZIASIA, LENOIR. Date of Service: 08/29/2016 12:30 PM Medical Record Number: [id number] Patient Account Number: [id number] Date of Birth/Sex: 1930-08-27 (83 y.o. Female) Treating RN: Ashok Cordia, Debi Primary Care Lestat Golob: Jodi Mourning Other Clinician: Referring Adiva Boettner: Jodi Mourning Treating Rashi Granier/Extender: Rudene Re in Treatment: 3 Active Inactive ` Abuse / Safety / Falls / Self Care Management Nursing Diagnoses: Potential for falls Goals: Patient will remain injury free Date Initiated: 08/08/2016 Target Resolution Date: 11/12/2016 Goal Status: Active Interventions: Assess fall risk on admission and as needed Assess impairment of mobility on admission and as needed per policy Notes: ` Nutrition Nursing Diagnoses: Imbalanced nutrition Impaired glucose control:  actual or potential Goals: Patient/caregiver agrees to and verbalizes understanding of need to use nutritional supplements and/or vitamins as prescribed Date Initiated: 08/08/2016 Target Resolution Date: 12/10/2016 Goal Status: Active Patient/caregiver verbalizes understanding of need to maintain therapeutic glucose control per primary care physician Date Initiated: 08/08/2016 Target Resolution Date: 12/10/2016 Goal  Status: Active Interventions: Assess HgA1c results as ordered upon admission and as needed Assess patient nutrition upon admission and as needed per policy Tamara HoitWALKER, Jeree C. ([id number]) Notes: ` Orientation to the Wound Care Program Nursing Diagnoses: Knowledge deficit related to the wound healing center program Goals: Patient/caregiver will verbalize understanding of the Wound Healing Center Program Date Initiated: 08/08/2016 Target Resolution Date: 09/10/2016 Goal Status: Active Interventions: Provide education on orientation to the wound center Notes: ` Pain, Acute or Chronic Nursing Diagnoses: Pain, acute or chronic: actual or potential Potential alteration in comfort, pain Goals: Patient/caregiver will verbalize adequate pain control between visits Date Initiated: 08/08/2016 Target Resolution Date: 12/10/2016 Goal Status: Active Interventions: Complete pain assessment as per visit requirements Notes: ` Wound/Skin Impairment Nursing Diagnoses: Impaired tissue integrity Knowledge deficit related to ulceration/compromised skin integrity Goals: Ulcer/skin breakdown will have a volume reduction of 80% by week 12 Date Initiated: 08/08/2016 Target Resolution Date: 12/10/2016 Goal Status: Active Tamara HoitWALKER, Gabreille C. ([id number]) Interventions: Assess patient/caregiver ability to perform ulcer/skin care regimen upon admission and as needed Assess ulceration(s) every visit Notes: Electronic Signature(s) Signed: 08/29/2016 4:13:03 PM By: Alejandro MullingPinkerton, Debra Entered By: Alejandro MullingPinkerton, Debra on 08/29/2016 12:58:19 Tamara HoitWALKER, Linnea C. ([id number]) -------------------------------------------------------------------------------- Pain Assessment Details Patient Name: Tamara HoitWALKER, Valoree C. Date of Service: 08/29/2016 12:30 PM Medical Record Number: [id number] Patient Account Number: [id number] Date of Birth/Sex: 01/12/1931 (83 y.o. Female) Treating RN: Ashok CordiaPinkerton, Debi Primary Care Pearly Bartosik: Jodi MourningWalker III,  John Other Clinician: Referring Emilea Goga: Jodi MourningWalker III, John Treating Melvina Pangelinan/Extender: Rudene ReBritto, Errol Weeks in Treatment: 3 Active Problems Location of Pain Severity and Description of Pain Patient Has Paino No Site Locations With Dressing Change: No Pain Management and Medication Current Pain Management: Electronic Signature(s) Signed: 08/29/2016 4:13:03 PM By: Alejandro MullingPinkerton, Debra Entered By: Alejandro MullingPinkerton, Debra on 08/29/2016 12:44:08 Tamara HoitWALKER, Daun C. ([id number]) -------------------------------------------------------------------------------- Patient/Caregiver Education Details Patient Name: Tamara HoitWALKER, Aviendha C. Date of Service: 08/29/2016 12:30 PM Medical Record Number: [id number] Patient Account Number: [id number] Date of Birth/Gender: 04/04/1931 (83 y.o. Female) Treating RN: Ashok CordiaPinkerton, Debi Primary Care Physician: Jodi MourningWalker III, John Other Clinician: Referring Physician: Jodi MourningWalker III, John Treating Physician/Extender: Rudene ReBritto, Errol Weeks in Treatment: 3 Education Assessment Education Provided To: Patient and Caregiver Education Topics Provided Wound/Skin Impairment: Handouts: Other: change dressing as ordered and do not get dressing wet Methods: Demonstration, Explain/Verbal Responses: State content correctly Electronic Signature(s) Signed: 08/29/2016 4:13:03 PM By: Alejandro MullingPinkerton, Debra Entered By: Alejandro MullingPinkerton, Debra on 08/29/2016 13:02:02 Tamara HoitWALKER, Courtny C. ([id number]) -------------------------------------------------------------------------------- Wound Assessment Details Patient Name: Tamara HoitWALKER, Alvine C. Date of Service: 08/29/2016 12:30 PM Medical Record Number: [id number] Patient Account Number: [id number] Date of Birth/Sex: 04/01/1931 (83 y.o. Female) Treating RN: Ashok CordiaPinkerton, Debi Primary Care Anuj Summons: Jodi MourningWalker III, John Other Clinician: Referring Tomeka Kantner: Jodi MourningWalker III, John Treating Tramya Schoenfelder/Extender: Rudene ReBritto, Errol Weeks in Treatment: 3 Wound Status Wound Number: 1 Primary Diabetic  Wound/Ulcer of the Lower Etiology: Extremity Wound Location: Left Lower Leg - Anterior Wound Open Wounding Event: Blister Status: Date Acquired: 08/01/2016 Comorbid Arrhythmia, Congestive Heart Failure, Weeks Of Treatment: 3 History: Hypertension, Type II Diabetes, Clustered Wound: No Osteoarthritis Photos Photo Uploaded By: Alejandro MullingPinkerton, Debra on 08/29/2016 15:44:51 Wound Measurements Length: (cm) 1 Width: (cm) 1 Depth: (cm) 0.2 Area: (cm) 0.785 Volume: (cm) 0.157 % Reduction in  Area: 98.4% % Reduction in Volume: 96.7% Epithelialization: None Tunneling: No Undermining: No Wound Description Classification: Grade 2 Foul Odor Aft Wound Margin: Distinct, outline attached Slough/Fibrin Exudate Amount: Large Exudate Type: Serosanguineous Exudate Color: red, brown er Cleansing: No o No Wound Bed Granulation Amount: Small (1-33%) Exposed Structure Granulation Quality: Red Fascia Exposed: No Necrotic Amount: Large (67-100%) Fat Layer (Subcutaneous Tissue) Exposed: No Necrotic Quality: Eschar, Adherent Slough Tendon Exposed: No Shishido, Faun C. ([id number]) Muscle Exposed: No Joint Exposed: No Bone Exposed: No Limited to Skin Breakdown Periwound Skin Texture Texture Color No Abnormalities Noted: No No Abnormalities Noted: No Moisture Temperature / Pain No Abnormalities Noted: No Temperature: No Abnormality Tenderness on Palpation: Yes Wound Preparation Ulcer Cleansing: Rinsed/Irrigated with Saline Topical Anesthetic Applied: Other: lidocaine 4%, Treatment Notes Wound #1 (Left, Anterior Lower Leg) 1. Cleansed with: Clean wound with Normal Saline Cleanse wound with antibacterial soap and water 2. Anesthetic Topical Lidocaine 4% cream to wound bed prior to debridement 4. Dressing Applied: Hydrafera Blue 5. Secondary Dressing Applied ABD Pad 7. Secured with Tape 3 Layer Compression System - Left Lower Extremity Notes unna to anchor Electronic  Signature(s) Signed: 08/29/2016 4:13:03 PM By: Alejandro Mulling Entered By: Alejandro Mulling on 08/29/2016 12:56:45 Tamara Campbell ([id number]) -------------------------------------------------------------------------------- Wound Assessment Details Patient Name: Tamara Campbell. Date of Service: 08/29/2016 12:30 PM Medical Record Number: [id number] Patient Account Number: [id number] Date of Birth/Sex: September 30, 1930 (83 y.o. Female) Treating RN: Ashok Cordia, Debi Primary Care Maurene Hollin: Jodi Mourning Other Clinician: Referring Joani Cosma: Jodi Mourning Treating Mylin Hirano/Extender: Rudene Re in Treatment: 3 Wound Status Wound Number: 2 Primary Diabetic Wound/Ulcer of the Lower Etiology: Extremity Wound Location: Right Lower Leg - Anterior Wound Open Wounding Event: Blister Status: Date Acquired: 08/01/2016 Comorbid Arrhythmia, Congestive Heart Failure, Weeks Of Treatment: 3 History: Hypertension, Type II Diabetes, Clustered Wound: No Osteoarthritis Photos Photo Uploaded By: Alejandro Mulling on 08/29/2016 15:44:51 Wound Measurements Length: (cm) 0.1 Width: (cm) 0.1 Depth: (cm) 0.1 Area: (cm) 0.008 Volume: (cm) 0.001 % Reduction in Area: 100% % Reduction in Volume: 100% Epithelialization: Large (67-100%) Tunneling: No Undermining: No Wound Description Classification: Grade 2 Foul Odor Afte Wound Margin: Distinct, outline attached Slough/Fibrino Exudate Amount: None Present r Cleansing: No No Wound Bed Granulation Amount: Large (67-100%) Exposed Structure Necrotic Amount: None Present (0%) Fascia Exposed: No Fat Layer (Subcutaneous Tissue) Exposed: No Tendon Exposed: No Muscle Exposed: No Joint Exposed: No Tisdell, Alisi C. ([id number]) Bone Exposed: No Limited to Skin Breakdown Periwound Skin Texture Texture Color No Abnormalities Noted: No No Abnormalities Noted: No Callus: No Atrophie Blanche: No Crepitus: No Cyanosis: No Excoriation:  No Ecchymosis: No Induration: No Erythema: No Rash: No Hemosiderin Staining: No Scarring: No Mottled: No Pallor: No Moisture Rubor: No No Abnormalities Noted: No Dry / Scaly: No Temperature / Pain Maceration: No Temperature: No Abnormality Tenderness on Palpation: Yes Wound Preparation Ulcer Cleansing: Rinsed/Irrigated with Saline Topical Anesthetic Applied: None Treatment Notes Wound #2 (Right, Anterior Lower Leg) 1. Cleansed with: Clean wound with Normal Saline 3. Peri-wound Care: Moisturizing lotion Electronic Signature(s) Signed: 08/29/2016 4:13:03 PM By: Alejandro Mulling Entered By: Alejandro Mulling on 08/29/2016 13:23:12 Tamara Campbell ([id number]) -------------------------------------------------------------------------------- Vitals Details Patient Name: Tamara Campbell Date of Service: 08/29/2016 12:30 PM Medical Record Number: [id number] Patient Account Number: [id number] Date of Birth/Sex: 1931-05-23 (83 y.o. Female) Treating RN: Ashok Cordia, Debi Primary Care Syrita Dovel: Jodi Mourning Other Clinician: Referring Shamecka Hocutt: Jodi Mourning Treating Treyvon Blahut/Extender: Rudene Re in Treatment: 3 Vital Signs Time Taken: 12:44  Temperature (F): 97.5 Height (in): 62 Pulse (bpm): 73 Weight (lbs): 130 Respiratory Rate (breaths/min): 18 Body Mass Index (BMI): 23.8 Blood Pressure (mmHg): 140/85 Reference Range: 80 - 120 mg / dl Electronic Signature(s) Signed: 08/29/2016 4:13:03 PM By: Alejandro Mulling Entered By: Alejandro Mulling on 08/29/2016 12:46:14

## 2016-09-05 ENCOUNTER — Encounter: Payer: Medicare HMO | Attending: Surgery | Admitting: Surgery

## 2016-09-05 DIAGNOSIS — E11622 Type 2 diabetes mellitus with other skin ulcer: Secondary | ICD-10-CM | POA: Insufficient documentation

## 2016-09-05 DIAGNOSIS — L851 Acquired keratosis [keratoderma] palmaris et plantaris: Secondary | ICD-10-CM | POA: Diagnosis not present

## 2016-09-05 DIAGNOSIS — E785 Hyperlipidemia, unspecified: Secondary | ICD-10-CM | POA: Insufficient documentation

## 2016-09-05 DIAGNOSIS — I4891 Unspecified atrial fibrillation: Secondary | ICD-10-CM | POA: Diagnosis not present

## 2016-09-05 DIAGNOSIS — Z8673 Personal history of transient ischemic attack (TIA), and cerebral infarction without residual deficits: Secondary | ICD-10-CM | POA: Insufficient documentation

## 2016-09-05 DIAGNOSIS — I509 Heart failure, unspecified: Secondary | ICD-10-CM | POA: Diagnosis not present

## 2016-09-05 DIAGNOSIS — Z950 Presence of cardiac pacemaker: Secondary | ICD-10-CM | POA: Diagnosis not present

## 2016-09-05 DIAGNOSIS — Z79899 Other long term (current) drug therapy: Secondary | ICD-10-CM | POA: Diagnosis not present

## 2016-09-05 DIAGNOSIS — M1990 Unspecified osteoarthritis, unspecified site: Secondary | ICD-10-CM | POA: Diagnosis not present

## 2016-09-05 DIAGNOSIS — L97221 Non-pressure chronic ulcer of left calf limited to breakdown of skin: Secondary | ICD-10-CM | POA: Diagnosis not present

## 2016-09-05 DIAGNOSIS — L97822 Non-pressure chronic ulcer of other part of left lower leg with fat layer exposed: Secondary | ICD-10-CM | POA: Diagnosis not present

## 2016-09-05 DIAGNOSIS — E1142 Type 2 diabetes mellitus with diabetic polyneuropathy: Secondary | ICD-10-CM | POA: Diagnosis not present

## 2016-09-05 DIAGNOSIS — I890 Lymphedema, not elsewhere classified: Secondary | ICD-10-CM | POA: Diagnosis not present

## 2016-09-05 DIAGNOSIS — I110 Hypertensive heart disease with heart failure: Secondary | ICD-10-CM | POA: Insufficient documentation

## 2016-09-05 DIAGNOSIS — B351 Tinea unguium: Secondary | ICD-10-CM | POA: Diagnosis not present

## 2016-09-05 DIAGNOSIS — I429 Cardiomyopathy, unspecified: Secondary | ICD-10-CM | POA: Insufficient documentation

## 2016-09-05 DIAGNOSIS — L97211 Non-pressure chronic ulcer of right calf limited to breakdown of skin: Secondary | ICD-10-CM | POA: Diagnosis not present

## 2016-09-05 DIAGNOSIS — Z7901 Long term (current) use of anticoagulants: Secondary | ICD-10-CM | POA: Insufficient documentation

## 2016-09-06 NOTE — Progress Notes (Signed)
Tamara Campbell, Tamara Campbell ([id number]) Visit Report for 09/05/2016 Chief Complaint Document Details Patient Name: Tamara Campbell, Tamara Campbell. Date of Service: 09/05/2016 10:15 AM Medical Record Number: [id number] Patient Account Number: [id number] Date of Birth/Sex: 09/14/1930 (83 y.o. Female) Treating RN: Curtis Sites Primary Care Provider: Jodi Mourning Other Clinician: Referring Provider: Jodi Mourning Treating Provider/Extender: Rudene Re in Treatment: 4 Information Obtained from: Patient Chief Complaint Patient presents to the wound care center for a consult due non healing wound both lower extremities with swelling for the last 2 weeks Electronic Signature(s) Signed: 09/05/2016 11:04:38 AM By: Evlyn Kanner MD, FACS Entered By: Evlyn Kanner on 09/05/2016 11:04:38 Tamara Campbell ([id number]) -------------------------------------------------------------------------------- Debridement Details Patient Name: Tamara Campbell. Date of Service: 09/05/2016 10:15 AM Medical Record Number: [id number] Patient Account Number: [id number] Date of Birth/Sex: 11/23/1930 (83 y.o. Female) Treating RN: Curtis Sites Primary Care Provider: Jodi Mourning Other Clinician: Referring Provider: Jodi Mourning Treating Provider/Extender: Rudene Re in Treatment: 4 Debridement Performed for Wound #1 Left,Anterior Lower Leg Assessment: Performed By: Physician Evlyn Kanner, MD Debridement: Debridement Pre-procedure Yes - 10:45 Verification/Time Out Taken: Start Time: 10:45 Pain Control: Lidocaine 4% Topical Solution Level: Skin/Subcutaneous Tissue Total Area Debrided (L x 1 (cm) x 0.7 (cm) = 0.7 (cm) W): Tissue and other Viable, Non-Viable, Fibrin/Slough, Subcutaneous material debrided: Instrument: Curette Bleeding: Minimum Hemostasis Achieved: Pressure End Time: 10:47 Procedural Pain: 0 Post Procedural Pain: 0 Response to Treatment: Procedure was tolerated well Post Debridement  Measurements of Total Wound Length: (cm) 1 Width: (cm) 0.7 Depth: (cm) 0.1 Volume: (cm) 0.055 Character of Wound/Ulcer Post Improved Debridement: Severity of Tissue Post Debridement: Fat layer exposed Post Procedure Diagnosis Same as Pre-procedure Electronic Signature(s) Signed: 09/05/2016 11:04:26 AM By: Evlyn Kanner MD, FACS Signed: 09/05/2016 4:56:18 PM By: Curtis Sites Entered By: Evlyn Kanner on 09/05/2016 11:04:26 Tamara Campbell, Tamara Campbell ([id number]) Tamara Campbell, Tamara Campbell ([id number]) -------------------------------------------------------------------------------- HPI Details Patient Name: Tamara Campbell. Date of Service: 09/05/2016 10:15 AM Medical Record Number: [id number] Patient Account Number: [id number] Date of Birth/Sex: [birth year] (83 y.o. Female) Treating RN: Curtis Sites Primary Care Provider: Jodi Mourning Other Clinician: Referring Provider: Jodi Mourning Treating Provider/Extender: Rudene Re in Treatment: 4 History of Present Illness Location: both lower extremities Quality: Patient reports No Pain. Severity: Patient states wound (s) are getting better. Duration: Patient has had the wound for < 2 weeks prior to presenting for treatment Context: The wound appeared gradually over time Modifying Factors: Other treatment(s) tried include:Silvadene cream and compression wraps Associated Signs and Symptoms: Patient reports having increase swelling. HPI Description: 83 year old patient who has been sent to see [place] by her PCP Dr. Yates Decamp, for open wounds on her left lower extremity, limited to breakdown of skin, possibly due to venous stasis ulcer, has a past medical history of diabetes mellitus type 2, status post cardiac pacemaker, essential hypertension, atrial fibrillation, CHF, cardiomyopathy, ventricular tachycardia, status post right total knee arthroplasty in 2014, back surgery in 1996, hysterectomy. She has never been a smoker. her PCP found that she  had stasis edema of both lower extremities left worse than right and had ulceration on the lateral aspect of her left leg. Last hemoglobin A1c done and February of this year was 6.3% patient's daughter tells me that she has seen at [facility] for an opinion about a year ago and hence we will try and obtain these reports regarding the workup done and the recommendations made. 08/22/2016 -- -- the patient was seen  by Dr. Gilda Crease on 06/22/2015 and after examination he made a note of her atherosclerosis of the lower extremities with ulceration and recommended noninvasive studies. The patient was seen back in follow-up on 07/06/2015 and her right ABI was 1.13 and the left was 1.20 with triphasic pedal signals bilaterally. Duplex ultrasound of the left lower extremity showed SFA, popliteal and tibial arteries with diffuse disease but no focal stenosis and triphasic. His impression was since these arterial studies were normal he thought the problem for the ulceration was more venous then a peripheral arterial disease. Commended to continue with graduated compression and follow back in 3 months. Electronic Signature(s) Signed: 09/05/2016 11:04:46 AM By: Evlyn Kanner MD, FACS Entered By: Evlyn Kanner on 09/05/2016 11:04:45 Tamara Campbell ([id number]) -------------------------------------------------------------------------------- Physical Exam Details Patient Name: Tamara Campbell Date of Service: 09/05/2016 10:15 AM Medical Record Number: [id number] Patient Account Number: [id number] Date of Birth/Sex: 04-17-1931 (83 y.o. Female) Treating RN: Curtis Sites Primary Care Provider: Jodi Mourning Other Clinician: Referring Provider: Jodi Mourning Treating Provider/Extender: Rudene Re in Treatment: 4 Constitutional . Pulse regular. Respirations normal and unlabored. Afebrile. . Eyes Nonicteric. Reactive to light. Ears, Nose, Mouth, and Throat Lips, teeth, and gums  WNL.Marland Kitchen Moist mucosa without lesions. Neck supple and nontender. No palpable supraclavicular or cervical adenopathy. Normal sized without goiter. Respiratory WNL. No retractions.. Cardiovascular Pedal Pulses WNL. No clubbing, cyanosis or edema. Lymphatic No adneopathy. No adenopathy. No adenopathy. Musculoskeletal Adexa without tenderness or enlargement.. Digits and nails w/o clubbing, cyanosis, infection, petechiae, ischemia, or inflammatory conditions.. Integumentary (Hair, Skin) No suspicious lesions. No crepitus or fluctuance. No peri-wound warmth or erythema. No masses.Marland Kitchen Psychiatric Judgement and insight Intact.. No evidence of depression, anxiety, or agitation.. Notes the right lower extremity is now being treated with juxta light compressions and she's been doing fine. The left lower extremity is looking better and she needed some sharp debridement with a #3 curet and minimal bleeding controlled with pressure and subcutaneousdebris was removed. Electronic Signature(s) Signed: 09/05/2016 11:05:25 AM By: Evlyn Kanner MD, FACS Entered By: Evlyn Kanner on 09/05/2016 11:05:24 Tamara Campbell ([id number]) -------------------------------------------------------------------------------- Physician Orders Details Patient Name: Tamara Campbell Date of Service: 09/05/2016 10:15 AM Medical Record Number: [id number] Patient Account Number: [id number] Date of Birth/Sex: 29-Apr-1931 (83 y.o. Female) Treating RN: Curtis Sites Primary Care Provider: Jodi Mourning Other Clinician: Referring Provider: Jodi Mourning Treating Provider/Extender: Rudene Re in Treatment: 4 Verbal / Phone Orders: No Diagnosis Coding Wound Cleansing Wound #1 Left,Anterior Lower Leg o Clean wound with Normal Saline. o May Shower, gently pat wound dry prior to applying new dressing. Anesthetic Wound #1 Left,Anterior Lower Leg o Topical Lidocaine 4% cream applied to wound bed prior to  debridement - clinic use only Skin Barriers/Peri-Wound Care Wound #1 Left,Anterior Lower Leg o Moisturizing lotion Primary Wound Dressing Wound #1 Left,Anterior Lower Leg o Hydrafera Blue Secondary Dressing Wound #1 Left,Anterior Lower Leg o ABD pad Dressing Change Frequency Wound #1 Left,Anterior Lower Leg o Change dressing every week Follow-up Appointments Wound #1 Left,Anterior Lower Leg o Return Appointment in 1 week. Edema Control Wound #1 Left,Anterior Lower Leg o 3 Layer Compression System - Left Lower Extremity - unna to anchor o Patient to wear own Juxtalite/Juzo compression garment. - on right leg o Elevate legs to the level of the heart and pump ankles as often as possible Ting, Tamara C. ([id number]) Additional Orders / Instructions Wound #1 Left,Anterior Lower Leg o Increase protein intake. Electronic Signature(s) Signed: 09/05/2016  4:17:13 PM By: Evlyn Kanner MD, FACS Signed: 09/05/2016 4:56:18 PM By: Curtis Sites Entered By: Curtis Sites on 09/05/2016 10:47:33 Tamara Campbell ([id number]) -------------------------------------------------------------------------------- Problem List Details Patient Name: INETA, SINNING. Date of Service: 09/05/2016 10:15 AM Medical Record Number: [id number] Patient Account Number: [id number] Date of Birth/Sex: 1930/09/15 (83 y.o. Female) Treating RN: Curtis Sites Primary Care Provider: Jodi Mourning Other Clinician: Referring Provider: Jodi Mourning Treating Provider/Extender: Rudene Re in Treatment: 4 Active Problems ICD-10 Encounter Code Description Active Date Diagnosis E11.622 Type 2 diabetes mellitus with other skin ulcer 08/08/2016 Yes I89.0 Lymphedema, not elsewhere classified 08/08/2016 Yes L97.221 Non-pressure chronic ulcer of left calf limited to 08/08/2016 Yes breakdown of skin L97.211 Non-pressure chronic ulcer of right calf limited to 08/08/2016 Yes breakdown of skin Inactive  Problems Resolved Problems Electronic Signature(s) Signed: 09/05/2016 11:04:09 AM By: Evlyn Kanner MD, FACS Entered By: Evlyn Kanner on 09/05/2016 11:04:09 Tamara Campbell ([id number]) -------------------------------------------------------------------------------- Progress Note Details Patient Name: Tamara Campbell. Date of Service: 09/05/2016 10:15 AM Medical Record Number: [id number] Patient Account Number: [id number] Date of Birth/Sex: 11/29/1930 (83 y.o. Female) Treating RN: Curtis Sites Primary Care Provider: Jodi Mourning Other Clinician: Referring Provider: Jodi Mourning Treating Provider/Extender: Rudene Re in Treatment: 4 Subjective Chief Complaint Information obtained from Patient Patient presents to the wound care center for a consult due non healing wound both lower extremities with swelling for the last 2 weeks History of Present Illness (HPI) The following HPI elements were documented for the patient's wound: Location: both lower extremities Quality: Patient reports No Pain. Severity: Patient states wound (s) are getting better. Duration: Patient has had the wound for < 2 weeks prior to presenting for treatment Context: The wound appeared gradually over time Modifying Factors: Other treatment(s) tried include:Silvadene cream and compression wraps Associated Signs and Symptoms: Patient reports having increase swelling. 83 year old patient who has been sent to see [place] by her PCP Dr. Yates Decamp, for open wounds on her left lower extremity, limited to breakdown of skin, possibly due to venous stasis ulcer, has a past medical history of diabetes mellitus type 2, status post cardiac pacemaker, essential hypertension, atrial fibrillation, CHF, cardiomyopathy, ventricular tachycardia, status post right total knee arthroplasty in 2014, back surgery in 1996, hysterectomy. She has never been a smoker. her PCP found that she had stasis edema of both lower  extremities left worse than right and had ulceration on the lateral aspect of her left leg. Last hemoglobin A1c done and February of this year was 6.3% patient's daughter tells me that she has seen at [facility] for an opinion about a year ago and hence we will try and obtain these reports regarding the workup done and the recommendations made. 08/22/2016 -- -- the patient was seen by Dr. Gilda Crease on 06/22/2015 and after examination he made a note of her atherosclerosis of the lower extremities with ulceration and recommended noninvasive studies. The patient was seen back in follow-up on 07/06/2015 and her right ABI was 1.13 and the left was 1.20 with triphasic pedal signals bilaterally. Duplex ultrasound of the left lower extremity showed SFA, popliteal and tibial arteries with diffuse disease but no focal stenosis and triphasic. His impression was since these arterial studies were normal he thought the problem for the ulceration was more venous then a peripheral arterial disease. Commended to continue with graduated compression and follow back in 3 months. Tamara Campbell, Tamara Campbell ([id number]) Objective Constitutional Pulse regular. Respirations normal and unlabored. Afebrile.  Vitals Time Taken: 10:22 AM, Height: 62 in, Weight: 130 lbs, BMI: 23.8, Temperature: 97.6 F, Pulse: 69 bpm, Respiratory Rate: 18 breaths/min, Blood Pressure: 151/81 mmHg. Eyes Nonicteric. Reactive to light. Ears, Nose, Mouth, and Throat Lips, teeth, and gums WNL.Marland Kitchen Moist mucosa without lesions. Neck supple and nontender. No palpable supraclavicular or cervical adenopathy. Normal sized without goiter. Respiratory WNL. No retractions.. Cardiovascular Pedal Pulses WNL. No clubbing, cyanosis or edema. Lymphatic No adneopathy. No adenopathy. No adenopathy. Musculoskeletal Adexa without tenderness or enlargement.. Digits and nails w/o clubbing, cyanosis, infection, petechiae, ischemia, or inflammatory  conditions.Marland Kitchen Psychiatric Judgement and insight Intact.. No evidence of depression, anxiety, or agitation.. General Notes: the right lower extremity is now being treated with juxta light compressions and she's been doing fine. The left lower extremity is looking better and she needed some sharp debridement with a #3 curet and minimal bleeding controlled with pressure and subcutaneousdebris was removed. Integumentary (Hair, Skin) No suspicious lesions. No crepitus or fluctuance. No peri-wound warmth or erythema. No masses.. Wound #1 status is Open. Original cause of wound was Blister. The wound is located on the Left,Anterior Lower Leg. The wound measures 1cm length x 0.7cm width x 0.1cm depth; 0.55cm^2 area and 0.055cm^3 volume. The wound is limited to skin breakdown. There is no tunneling or undermining noted. There is a large amount of serosanguineous drainage noted. The wound margin is distinct with the outline attached to the wound base. There is large (67-100%) red granulation within the wound bed. There is a small (1-33%) Tamara Campbell, Tamara C. ([id number]) amount of necrotic tissue within the wound bed including Eschar and Adherent Slough. Periwound temperature was noted as No Abnormality. The periwound has tenderness on palpation. Wound #2 status is Healed - Epithelialized. Original cause of wound was Blister. The wound is located on the Right,Anterior Lower Leg. The wound measures 0cm length x 0cm width x 0cm depth; 0cm^2 area and 0cm^3 volume. Assessment Active Problems ICD-10 E11.622 - Type 2 diabetes mellitus with other skin ulcer I89.0 - Lymphedema, not elsewhere classified L97.221 - Non-pressure chronic ulcer of left calf limited to breakdown of skin L97.211 - Non-pressure chronic ulcer of right calf limited to breakdown of skin Procedures Wound #1 Wound #1 is a Diabetic Wound/Ulcer of the Lower Extremity located on the Left,Anterior Lower Leg . There was a Skin/Subcutaneous Tissue  Debridement ([id number]) debridement with total area of 0.7 sq cm performed by Evlyn Kanner, MD. with the following instrument(s): Curette to remove Viable and Non-Viable tissue/material including Fibrin/Slough and Subcutaneous after achieving pain control using Lidocaine 4% Topical Solution. A time out was conducted at 10:45, prior to the start of the procedure. A Minimum amount of bleeding was controlled with Pressure. The procedure was tolerated well with a pain level of 0 throughout and a pain level of 0 following the procedure. Post Debridement Measurements: 1cm length x 0.7cm width x 0.1cm depth; 0.055cm^3 volume. Character of Wound/Ulcer Post Debridement is improved. Severity of Tissue Post Debridement is: Fat layer exposed. Post procedure Diagnosis Wound #1: Same as Pre-Procedure Plan Wound Cleansing: Wound #1 Left,Anterior Lower Leg: Tomes, Sena C. ([id number]) Clean wound with Normal Saline. May Shower, gently pat wound dry prior to applying new dressing. Anesthetic: Wound #1 Left,Anterior Lower Leg: Topical Lidocaine 4% cream applied to wound bed prior to debridement - clinic use only Skin Barriers/Peri-Wound Care: Wound #1 Left,Anterior Lower Leg: Moisturizing lotion Primary Wound Dressing: Wound #1 Left,Anterior Lower Leg: Hydrafera Blue Secondary Dressing: Wound #1 Left,Anterior Lower Leg: ABD pad Dressing  Change Frequency: Wound #1 Left,Anterior Lower Leg: Change dressing every week Follow-up Appointments: Wound #1 Left,Anterior Lower Leg: Return Appointment in 1 week. Edema Control: Wound #1 Left,Anterior Lower Leg: 3 Layer Compression System - Left Lower Extremity - unna to anchor Patient to wear own Juxtalite/Juzo compression garment. - on right leg Elevate legs to the level of the heart and pump ankles as often as possible Additional Orders / Instructions: Wound #1 Left,Anterior Lower Leg: Increase protein intake. After review I have recommended: 1.  Hydrofera blue with a 3 layer Profore to be applied to the left lower extremities and keep it unchanged for the week. 2. juxta light compressions to be used on the right lower extremity and we will order these for bilateral lower legs. 3. elevation and exercise discussed with her in detail. 4. Regular visits the wound center The patient's daughter and daughter-in-law both at the bedside have had all questions answered Electronic Signature(s) Signed: 09/05/2016 11:06:11 AM By: Evlyn Kanner MD, FACS [place], [place] CMarland Kitchen ([id number]) Entered By: Evlyn Kanner on 09/05/2016 11:06:11 Tamara Campbell ([id number]) -------------------------------------------------------------------------------- SuperBill Details Patient Name: Tamara Campbell. Date of Service: 09/05/2016 Medical Record Number: [id number] Patient Account Number: [id number] Date of Birth/Sex: [birth year] (83 y.o. Female) Treating RN: Curtis Sites Primary Care Provider: Jodi Mourning Other Clinician: Referring Provider: Jodi Mourning Treating Provider/Extender: Rudene Re in Treatment: 4 Diagnosis Coding ICD-10 Codes Code Description [phone] Type 2 diabetes mellitus with other skin ulcer I89.0 Lymphedema, not elsewhere classified L97.221 Non-pressure chronic ulcer of left calf limited to breakdown of skin L97.211 Non-pressure chronic ulcer of right calf limited to breakdown of skin Facility Procedures CPT4 Code Description: [id number] - DEB SUBQ TISSUE 20 SQ CM/< ICD-10 Description Diagnosis E11.622 Type 2 diabetes mellitus with other skin ulcer I89.0 Lymphedema, not elsewhere classified L97.221 Non-pressure chronic ulcer of left calf limited to Modifier: breakdown of s Quantity: 1 kin Physician Procedures CPT4 Code Description: [id number] - WC PHYS SUBQ TISS 20 SQ CM ICD-10 Description Diagnosis E11.622 Type 2 diabetes mellitus with other skin ulcer I89.0 Lymphedema, not elsewhere classified L97.221 Non-pressure  chronic ulcer of left calf limited to Modifier: breakdown of s Quantity: 1 kin Electronic Signature(s) Signed: 09/05/2016 11:06:27 AM By: Evlyn Kanner MD, FACS Entered By: Evlyn Kanner on 09/05/2016 11:06:27

## 2016-09-06 NOTE — Progress Notes (Signed)
NAIOMI, MUSTO ([id number]) Visit Report for 09/05/2016 Arrival Information Details Patient Name: Tamara Campbell, Tamara Campbell. Date of Service: 09/05/2016 10:15 AM Medical Record Number: [id number] Patient Account Number: [id number] Date of Birth/Sex: 09-Jun-1930 (83 y.o. Female) Treating RN: Curtis Sites Primary Care Janella Rogala: Jodi Mourning Other Clinician: Referring Mozetta Murfin: Jodi Mourning Treating Annita Ratliff/Extender: Rudene Re in Treatment: 4 Visit Information History Since Last Visit Added or deleted any medications: No Patient Arrived: [place] Any new allergies or adverse reactions: No Arrival Time: 10:20 Had a fall or experienced change in No Accompanied By: dtr activities of daily living that may affect Transfer Assistance: None risk of falls: Patient Identification Verified: Yes Signs or symptoms of abuse/neglect since last No Secondary Verification Process Yes visito Completed: Hospitalized since last visit: No Patient Requires No Has Dressing in Place as Prescribed: Yes Transmission-Based Has Compression in Place as Prescribed: Yes Precautions: Pain Present Now: No Patient Has Alerts: Yes Patient Alerts: Patient on Blood Thinner Eliquis DM II R ABI noncompressible Electronic Signature(s) Signed: 09/05/2016 4:56:18 PM By: Curtis Sites Entered By: Curtis Sites on 09/05/2016 10:20:53 Tamara Campbell ([id number]) -------------------------------------------------------------------------------- Encounter Discharge Information Details Patient Name: Tamara Campbell. Date of Service: 09/05/2016 10:15 AM Medical Record Number: [id number] Patient Account Number: [id number] Date of Birth/Sex: [birth year] (83 y.o. Female) Treating RN: Curtis Sites Primary Care Delbert Vu: Jodi Mourning Other Clinician: Referring Roshun Klingensmith: Jodi Mourning Treating Soni Kegel/Extender: Rudene Re in Treatment: 4 Encounter Discharge Information Items Discharge Pain Level:  0 Discharge Condition: Stable Ambulatory Status: [place] Discharge Destination: Home Transportation: Private Auto Accompanied By: dtr Schedule Follow-up Appointment: Yes Medication Reconciliation completed No and provided to Patient/Care Tammee Thielke: Provided on Clinical Summary of Care: 09/05/2016 Form Type Recipient Paper Patient AW Electronic Signature(s) Signed: 09/05/2016 11:01:46 AM By: Gwenlyn Perking Entered By: Gwenlyn Perking on 09/05/2016 11:01:46 Tamara Campbell ([id number]) -------------------------------------------------------------------------------- Lower Extremity Assessment Details Patient Name: Tamara Campbell Date of Service: 09/05/2016 10:15 AM Medical Record Number: [id number] Patient Account Number: [id number] Date of Birth/Sex: [birth year] (83 y.o. Female) Treating RN: Curtis Sites Primary Care Kavari Parrillo: Jodi Mourning Other Clinician: Referring Kesean Serviss: Jodi Mourning Treating Jori Thrall/Extender: Rudene Re in Treatment: 4 Vascular Assessment Pulses: Dorsalis Pedis Palpable: [Left:Yes] Posterior Tibial Extremity colors, hair growth, and conditions: Extremity Color: [Left:Hyperpigmented] Hair Growth on Extremity: [Left:No] Temperature of Extremity: [Left:Warm] Capillary Refill: [Left:< 3 seconds] Electronic Signature(s) Signed: 09/05/2016 4:56:18 PM By: Curtis Sites Entered By: Curtis Sites on 09/05/2016 10:39:06 Tamara Campbell ([id number]) -------------------------------------------------------------------------------- Multi Wound Chart Details Patient Name: Tamara Campbell. Date of Service: 09/05/2016 10:15 AM Medical Record Number: [id number] Patient Account Number: [id number] Date of Birth/Sex: 01/12/1931 (83 y.o. Female) Treating RN: Curtis Sites Primary Care Taitum Alms: Jodi Mourning Other Clinician: Referring Mycah Mcdougall: Jodi Mourning Treating Amanuel Sinkfield/Extender: Rudene Re in Treatment: 4 Vital Signs Height(in):  62 Pulse(bpm): 69 Weight(lbs): 130 Blood Pressure 151/81 (mmHg): Body Mass Index(BMI): 24 Temperature(F): 97.6 Respiratory Rate 18 (breaths/min): Photos: [1:No Photos] [2:No Photos] [N/A:N/A] Wound Location: [1:Left Lower Leg - Anterior] [2:Right, Anterior Lower Leg] [N/A:N/A] Wounding Event: [1:Blister] [2:Blister] [N/A:N/A] Primary Etiology: [1:Diabetic Wound/Ulcer of the Lower Extremity] [2:Diabetic Wound/Ulcer of the Lower Extremity] [N/A:N/A] Comorbid History: [1:Arrhythmia, Congestive Heart Failure, Hypertension, Type II Diabetes, Osteoarthritis] [2:N/A] [N/A:N/A] Date Acquired: [1:08/01/2016] [2:08/01/2016] [N/A:N/A] Weeks of Treatment: [1:4] [2:4] [N/A:N/A] Wound Status: [1:Open] [2:Healed - Epithelialized] [N/A:N/A] Measurements L x W x D 1x0.7x0.1 [2:0x0x0] [N/A:N/A] (cm) Area (cm) : [1:0.55] [2:0] [N/A:N/A] Volume (cm) : [1:0.055] [2:0] [N/A:N/A] % Reduction in  Area: [1:98.80%] [2:100.00%] [N/A:N/A] % Reduction in Volume: 98.80% [2:100.00%] [N/A:N/A] Classification: [1:Grade 2] [2:Grade 2] [N/A:N/A] Exudate Amount: [1:Large] [2:N/A] [N/A:N/A] Exudate Type: [1:Serosanguineous] [2:N/A] [N/A:N/A] Exudate Color: [1:red, brown] [2:N/A] [N/A:N/A] Wound Margin: [1:Distinct, outline attached] [2:N/A] [N/A:N/A] Granulation Amount: [1:Large (67-100%)] [2:N/A] [N/A:N/A] Granulation Quality: [1:Red] [2:N/A] [N/A:N/A] Necrotic Amount: [1:Small (1-33%)] [2:N/A] [N/A:N/A] Necrotic Tissue: [1:Eschar, Adherent Slough] [2:N/A] [N/A:N/A] Exposed Structures: [1:Fascia: No Fat Layer (Subcutaneous Tissue) Exposed: No] [2:N/A] [N/A:N/A] Tendon: No Muscle: No Joint: No Bone: No Limited to Skin Breakdown Epithelialization: None N/A N/A Debridement: Debridement ([id number]- N/A N/A [id number]) Pre-procedure 10:45 N/A N/A Verification/Time Out Taken: Pain Control: Lidocaine 4% Topical N/A N/A Solution Tissue Debrided: Fibrin/Slough, N/A N/A Subcutaneous Level: Skin/Subcutaneous N/A  N/A Tissue Debridement Area (sq 0.7 N/A N/A cm): Instrument: Curette N/A N/A Bleeding: Minimum N/A N/A Hemostasis Achieved: Pressure N/A N/A Procedural Pain: 0 N/A N/A Post Procedural Pain: 0 N/A N/A Debridement Treatment Procedure was tolerated N/A N/A Response: well Post Debridement 1x0.7x0.1 N/A N/A Measurements L x W x D (cm) Post Debridement 0.055 N/A N/A Volume: (cm) Periwound Skin Texture: No Abnormalities Noted No Abnormalities Noted N/A Periwound Skin No Abnormalities Noted No Abnormalities Noted N/A Moisture: Periwound Skin Color: No Abnormalities Noted No Abnormalities Noted N/A Temperature: No Abnormality N/A N/A Tenderness on Yes No N/A Palpation: Wound Preparation: Ulcer Cleansing: N/A N/A Rinsed/Irrigated with Saline Topical Anesthetic Applied: Other: lidocaine 4% Procedures Performed: Debridement N/A N/A Treatment Notes LANEISHA, MINO C. ([id number]) Wound #1 (Left, Anterior Lower Leg) 1. Cleansed with: Cleanse wound with antibacterial soap and water 2. Anesthetic Topical Lidocaine 4% cream to wound bed prior to debridement 4. Dressing Applied: Hydrafera Blue 5. Secondary Dressing Applied Dry Gauze 7. Secured with 3 Layer Compression System - Left Lower Extremity Notes unna to anchor Electronic Signature(s) Signed: 09/05/2016 11:04:14 AM By: Evlyn Kanner MD, FACS Entered By: Evlyn Kanner on 09/05/2016 11:04:14 Tamara Campbell ([id number]) -------------------------------------------------------------------------------- Multi-Disciplinary Care Plan Details Patient Name: MILLIANA, REDDOCH. Date of Service: 09/05/2016 10:15 AM Medical Record Number: [id number] Patient Account Number: [id number] Date of Birth/Sex: 09-14-1930 (83 y.o. Female) Treating RN: Curtis Sites Primary Care Braxtyn Dorff: Jodi Mourning Other Clinician: Referring Mercede Rollo: Jodi Mourning Treating Yuko Coventry/Extender: Rudene Re in Treatment: 4 Active Inactive ` Abuse /  Safety / Falls / Self Care Management Nursing Diagnoses: Potential for falls Goals: Patient will remain injury free Date Initiated: 08/08/2016 Target Resolution Date: 11/12/2016 Goal Status: Active Interventions: Assess fall risk on admission and as needed Assess impairment of mobility on admission and as needed per policy Notes: ` Nutrition Nursing Diagnoses: Imbalanced nutrition Impaired glucose control: actual or potential Goals: Patient/caregiver agrees to and verbalizes understanding of need to use nutritional supplements and/or vitamins as prescribed Date Initiated: 08/08/2016 Target Resolution Date: 12/10/2016 Goal Status: Active Patient/caregiver verbalizes understanding of need to maintain therapeutic glucose control per primary care physician Date Initiated: 08/08/2016 Target Resolution Date: 12/10/2016 Goal Status: Active Interventions: Assess HgA1c results as ordered upon admission and as needed Assess patient nutrition upon admission and as needed per policy CHARESE, ABUNDIS ([id number]) Notes: ` Orientation to the Wound Care Program Nursing Diagnoses: Knowledge deficit related to the wound healing center program Goals: Patient/caregiver will verbalize understanding of the Wound Healing Center Program Date Initiated: 08/08/2016 Target Resolution Date: 09/10/2016 Goal Status: Active Interventions: Provide education on orientation to the wound center Notes: ` Pain, Acute or Chronic Nursing Diagnoses: Pain, acute or chronic: actual or potential Potential alteration in comfort, pain Goals: Patient/caregiver will verbalize adequate  pain control between visits Date Initiated: 08/08/2016 Target Resolution Date: 12/10/2016 Goal Status: Active Interventions: Complete pain assessment as per visit requirements Notes: ` Wound/Skin Impairment Nursing Diagnoses: Impaired tissue integrity Knowledge deficit related to ulceration/compromised skin integrity Goals: Ulcer/skin  breakdown will have a volume reduction of 80% by week 12 Date Initiated: 08/08/2016 Target Resolution Date: 12/10/2016 Goal Status: Active SARAYAH, BACCHI ([id number]) Interventions: Assess patient/caregiver ability to perform ulcer/skin care regimen upon admission and as needed Assess ulceration(s) every visit Notes: Electronic Signature(s) Signed: 09/05/2016 4:56:18 PM By: Curtis Sites Entered By: Curtis Sites on 09/05/2016 10:44:30 Tamara Campbell ([id number]) -------------------------------------------------------------------------------- Pain Assessment Details Patient Name: Tamara Campbell. Date of Service: 09/05/2016 10:15 AM Medical Record Number: [id number] Patient Account Number: [id number] Date of Birth/Sex: [birth year] (83 y.o. Female) Treating RN: Curtis Sites Primary Care Aizen Duval: Jodi Mourning Other Clinician: Referring Trinty Marken: Jodi Mourning Treating Brieonna Crutcher/Extender: Rudene Re in Treatment: 4 Active Problems Location of Pain Severity and Description of Pain Patient Has Paino No Site Locations Pain Management and Medication Current Pain Management: Notes Topical or injectable lidocaine is offered to patient for acute pain when surgical debridement is performed. If needed, Patient is instructed to use over the counter pain medication for the following 24-48 hours after debridement. Wound care MDs do not prescribed pain medications. Patient has chronic pain or uncontrolled pain. Patient has been instructed to make an appointment with their Primary Care Physician for pain management. Electronic Signature(s) Signed: 09/05/2016 4:56:18 PM By: Curtis Sites Entered By: Curtis Sites on 09/05/2016 10:21:01 Tamara Campbell ([id number]) -------------------------------------------------------------------------------- Patient/Caregiver Education Details Patient Name: Tamara Campbell Date of Service: 09/05/2016 10:15 AM Medical Record Number:  [id number] Patient Account Number: [id number] Date of Birth/Gender: 1930-07-04 (83 y.o. Female) Treating RN: Curtis Sites Primary Care Physician: Jodi Mourning Other Clinician: Referring Physician: Jodi Mourning Treating Physician/Extender: Rudene Re in Treatment: 4 Education Assessment Education Provided To: Patient and Caregiver Education Topics Provided Venous: Handouts: Other: how to properly apply juxtalite Methods: Demonstration, Explain/Verbal Responses: State content correctly Electronic Signature(s) Signed: 09/05/2016 4:56:18 PM By: Curtis Sites Entered By: Curtis Sites on 09/05/2016 10:46:06 Tamara Campbell ([id number]) -------------------------------------------------------------------------------- Wound Assessment Details Patient Name: Tamara Campbell. Date of Service: 09/05/2016 10:15 AM Medical Record Number: [id number] Patient Account Number: [id number] Date of Birth/Sex: [birth year] (83 y.o. Female) Treating RN: Curtis Sites Primary Care Keyondre Hepburn: Jodi Mourning Other Clinician: Referring Jeoffrey Eleazer: Jodi Mourning Treating Anika Shore/Extender: Rudene Re in Treatment: 4 Wound Status Wound Number: 1 Primary Diabetic Wound/Ulcer of the Lower Etiology: Extremity Wound Location: Left Lower Leg - Anterior Wound Open Wounding Event: Blister Status: Date Acquired: 08/01/2016 Comorbid Arrhythmia, Congestive Heart Failure, Weeks Of Treatment: 4 History: Hypertension, Type II Diabetes, Clustered Wound: No Osteoarthritis Photos Photo Uploaded By: Curtis Sites on 09/05/2016 11:05:19 Wound Measurements Length: (cm) 1 Width: (cm) 0.7 Depth: (cm) 0.1 Area: (cm) 0.55 Volume: (cm) 0.055 % Reduction in Area: 98.8% % Reduction in Volume: 98.8% Epithelialization: None Tunneling: No Undermining: No Wound Description Classification: Grade 2 Foul Odor Aft Wound Margin: Distinct, outline attached Slough/Fibrin Exudate Amount:  Large Exudate Type: Serosanguineous Exudate Color: red, brown er Cleansing: No o No Wound Bed Granulation Amount: Large (67-100%) Exposed Structure Granulation Quality: Red Fascia Exposed: No Necrotic Amount: Small (1-33%) Fat Layer (Subcutaneous Tissue) Exposed: No Necrotic Quality: Eschar, Adherent Slough Tendon Exposed: No Merkel, Kathrynn C. ([id number]) Muscle Exposed: No Joint Exposed: No Bone Exposed: No Limited to Skin Breakdown Periwound Skin Texture Texture Color  No Abnormalities Noted: No No Abnormalities Noted: No Moisture Temperature / Pain No Abnormalities Noted: No Temperature: No Abnormality Tenderness on Palpation: Yes Wound Preparation Ulcer Cleansing: Rinsed/Irrigated with Saline Topical Anesthetic Applied: Other: lidocaine 4%, Treatment Notes Wound #1 (Left, Anterior Lower Leg) 1. Cleansed with: Cleanse wound with antibacterial soap and water 2. Anesthetic Topical Lidocaine 4% cream to wound bed prior to debridement 4. Dressing Applied: Hydrafera Blue 5. Secondary Dressing Applied Dry Gauze 7. Secured with 3 Layer Compression System - Left Lower Extremity Notes unna to anchor Electronic Signature(s) Signed: 09/05/2016 4:56:18 PM By: Curtis Sites Entered By: Curtis Sites on 09/05/2016 10:38:35 Tamara Campbell ([id number]) -------------------------------------------------------------------------------- Wound Assessment Details Patient Name: Tamara Campbell. Date of Service: 09/05/2016 10:15 AM Medical Record Number: [id number] Patient Account Number: [id number] Date of Birth/Sex: 1930-07-10 (83 y.o. Female) Treating RN: Curtis Sites Primary Care Heraclio Seidman: Jodi Mourning Other Clinician: Referring Aldred Mase: Jodi Mourning Treating Harve Spradley/Extender: Rudene Re in Treatment: 4 Wound Status Wound Number: 2 Primary Diabetic Wound/Ulcer of the Lower Etiology: Extremity Wound Location: Right, Anterior Lower Leg Wound Status: Healed -  Epithelialized Wounding Event: Blister Date Acquired: 08/01/2016 Weeks Of Treatment: 4 Clustered Wound: No Photos Photo Uploaded By: Curtis Sites on 09/05/2016 11:05:20 Wound Measurements Length: (cm) 0 % Reduction Width: (cm) 0 % Reduction Depth: (cm) 0 Area: (cm) 0 Volume: (cm) 0 in Area: 100% in Volume: 100% Wound Description Classification: Grade 2 Periwound Skin Texture Texture Color No Abnormalities Noted: No No Abnormalities Noted: No Moisture No Abnormalities Noted: No Electronic Signature(s) Signed: 09/05/2016 4:56:18 PM By: Coralee Rud, Arlis Porta ([id number]) Entered By: Curtis Sites on 09/05/2016 10:36:58 Tamara Campbell ([id number]) -------------------------------------------------------------------------------- Vitals Details Patient Name: Tamara Campbell. Date of Service: 09/05/2016 10:15 AM Medical Record Number: [id number] Patient Account Number: [id number] Date of Birth/Sex: 04-08-1931 (83 y.o. Female) Treating RN: Curtis Sites Primary Care Mkenzie Dotts: Jodi Mourning Other Clinician: Referring Powell Halbert: Jodi Mourning Treating Theia Dezeeuw/Extender: Rudene Re in Treatment: 4 Vital Signs Time Taken: 10:22 Temperature (F): 97.6 Height (in): 62 Pulse (bpm): 69 Weight (lbs): 130 Respiratory Rate (breaths/min): 18 Body Mass Index (BMI): 23.8 Blood Pressure (mmHg): 151/81 Reference Range: 80 - 120 mg / dl Electronic Signature(s) Signed: 09/05/2016 4:56:18 PM By: Curtis Sites Entered By: Curtis Sites on 09/05/2016 10:23:13

## 2016-09-12 ENCOUNTER — Ambulatory Visit: Payer: Commercial Managed Care - HMO | Admitting: Neurology

## 2016-09-12 ENCOUNTER — Encounter: Payer: Medicare HMO | Admitting: Surgery

## 2016-09-12 DIAGNOSIS — L97822 Non-pressure chronic ulcer of other part of left lower leg with fat layer exposed: Secondary | ICD-10-CM | POA: Diagnosis not present

## 2016-09-12 DIAGNOSIS — I890 Lymphedema, not elsewhere classified: Secondary | ICD-10-CM | POA: Diagnosis not present

## 2016-09-12 DIAGNOSIS — Z950 Presence of cardiac pacemaker: Secondary | ICD-10-CM | POA: Diagnosis not present

## 2016-09-12 DIAGNOSIS — L97221 Non-pressure chronic ulcer of left calf limited to breakdown of skin: Secondary | ICD-10-CM | POA: Diagnosis not present

## 2016-09-12 DIAGNOSIS — L97211 Non-pressure chronic ulcer of right calf limited to breakdown of skin: Secondary | ICD-10-CM | POA: Diagnosis not present

## 2016-09-12 DIAGNOSIS — I429 Cardiomyopathy, unspecified: Secondary | ICD-10-CM | POA: Diagnosis not present

## 2016-09-12 DIAGNOSIS — E11622 Type 2 diabetes mellitus with other skin ulcer: Secondary | ICD-10-CM | POA: Diagnosis not present

## 2016-09-12 DIAGNOSIS — I4891 Unspecified atrial fibrillation: Secondary | ICD-10-CM | POA: Diagnosis not present

## 2016-09-12 DIAGNOSIS — I110 Hypertensive heart disease with heart failure: Secondary | ICD-10-CM | POA: Diagnosis not present

## 2016-09-12 DIAGNOSIS — I509 Heart failure, unspecified: Secondary | ICD-10-CM | POA: Diagnosis not present

## 2016-09-12 NOTE — Progress Notes (Addendum)
Tamara Campbell, Tamara Campbell ([id number]) Visit Report for 09/12/2016 Chief Complaint Document Details Patient Name: Tamara Campbell, Tamara Campbell. Date of Service: 09/12/2016 3:00 PM Medical Record Number: [id number] Patient Account Number: [id number] Date of Birth/Sex: 27-Aug-1930 (83 y.o. Female) Treating RN: Huel Coventry Primary Care Provider: Jodi Mourning Other Clinician: Referring Provider: Jodi Mourning Treating Provider/Extender: Rudene Re in Treatment: 5 Information Obtained from: Patient Chief Complaint Patient presents to the wound care center for a consult due non healing wound both lower extremities with swelling for the last 2 weeks Electronic Signature(s) Signed: 09/12/2016 3:46:52 PM By: Evlyn Kanner MD, FACS Entered By: Evlyn Kanner on 09/12/2016 15:46:52 Tamara Campbell ([id number]) -------------------------------------------------------------------------------- Debridement Details Patient Name: Tamara Campbell. Date of Service: 09/12/2016 3:00 PM Medical Record Number: [id number] Patient Account Number: [id number] Date of Birth/Sex: 07-29-1930 (83 y.o. Female) Treating RN: Huel Coventry Primary Care Provider: Jodi Mourning Other Clinician: Referring Provider: Jodi Mourning Treating Provider/Extender: Rudene Re in Treatment: 5 Debridement Performed for Wound #1 Left,Anterior Lower Leg Assessment: Performed By: Physician Evlyn Kanner, MD Debridement: Debridement Pre-procedure Yes - 03:35 Verification/Time Out Taken: Start Time: 03:35 Pain Control: Lidocaine 4% Topical Solution Level: Skin/Subcutaneous Tissue Total Area Debrided (L x 0.5 (cm) x 0.4 (cm) = 0.2 (cm) W): Tissue and other Viable, Non-Viable, Fibrin/Slough, Subcutaneous material debrided: Instrument: Curette Bleeding: Minimum Hemostasis Achieved: Pressure End Time: 03:37 Procedural Pain: 0 Post Procedural Pain: 0 Response to Treatment: Procedure was tolerated well Post Debridement Measurements  of Total Wound Length: (cm) 0.5 Width: (cm) 0.4 Depth: (cm) 0.2 Volume: (cm) 0.031 Character of Wound/Ulcer Post Requires Further Debridement Debridement: Severity of Tissue Post Debridement: Fat layer exposed Post Procedure Diagnosis Same as Pre-procedure Electronic Signature(s) Signed: 09/12/2016 3:49:34 PM By: Evlyn Kanner MD, FACS Signed: 09/12/2016 4:45:37 PM By: Elliot Gurney RN, BSN, Kim RN, BSN Previous Signature: 09/12/2016 3:48:50 PM Version By: Evlyn Kanner MD, FACS Previous Signature: 09/12/2016 3:46:45 PM Version By: Evlyn Kanner MD, FACS [place], Arlis Porta ([id number]) Entered By: Evlyn Kanner on 09/12/2016 15:49:34 Tamara Campbell ([id number]) -------------------------------------------------------------------------------- HPI Details Patient Name: Tamara Campbell, Tamara Campbell C. Date of Service: 09/12/2016 3:00 PM Medical Record Number: [id number] Patient Account Number: [id number] Date of Birth/Sex: May 04, 1931 (83 y.o. Female) Treating RN: Huel Coventry Primary Care Provider: Jodi Mourning Other Clinician: Referring Provider: Jodi Mourning Treating Provider/Extender: Rudene Re in Treatment: 5 History of Present Illness Location: both lower extremities Quality: Patient reports No Pain. Severity: Patient states wound (s) are getting better. Duration: Patient has had the wound for < 2 weeks prior to presenting for treatment Context: The wound appeared gradually over time Modifying Factors: Other treatment(s) tried include:Silvadene cream and compression wraps Associated Signs and Symptoms: Patient reports having increase swelling. HPI Description: 83 year old patient who has been sent to see [place] by her PCP Dr. Yates Decamp, for open wounds on her left lower extremity, limited to breakdown of skin, possibly due to venous stasis ulcer, has a past medical history of diabetes mellitus type 2, status post cardiac pacemaker, essential hypertension, atrial fibrillation, CHF,  cardiomyopathy, ventricular tachycardia, status post right total knee arthroplasty in 2014, back surgery in 1996, hysterectomy. She has never been a smoker. her PCP found that she had stasis edema of both lower extremities left worse than right and had ulceration on the lateral aspect of her left leg. Last hemoglobin A1c done and February of this year was 6.3% patient's daughter tells me that she has seen at [facility] for an opinion  about a year ago and hence we will try and obtain these reports regarding the workup done and the recommendations made. 08/22/2016 -- -- the patient was seen by Dr. Gilda Crease on 06/22/2015 and after examination he made a note of her atherosclerosis of the lower extremities with ulceration and recommended noninvasive studies. The patient was seen back in follow-up on 07/06/2015 and her right ABI was 1.13 and the left was 1.20 with triphasic pedal signals bilaterally. Duplex ultrasound of the left lower extremity showed SFA, popliteal and tibial arteries with diffuse disease but no focal stenosis and triphasic. His impression was since these arterial studies were normal he thought the problem for the ulceration was more venous then a peripheral arterial disease. Commended to continue with graduated compression and follow back in 3 months. Electronic Signature(s) Signed: 09/12/2016 3:46:59 PM By: Evlyn Kanner MD, FACS Entered By: Evlyn Kanner on 09/12/2016 15:46:59 Tamara Campbell ([id number]) -------------------------------------------------------------------------------- Physical Exam Details Patient Name: Tamara Campbell Date of Service: 09/12/2016 3:00 PM Medical Record Number: [id number] Patient Account Number: [id number] Date of Birth/Sex: 1930/09/03 (83 y.o. Female) Treating RN: Huel Coventry Primary Care Provider: Jodi Mourning Other Clinician: Referring Provider: Jodi Mourning Treating Provider/Extender: Rudene Re in Treatment:  5 Constitutional . Pulse regular. Respirations normal and unlabored. Afebrile. . Eyes Nonicteric. Reactive to light. Ears, Nose, Mouth, and Throat Lips, teeth, and gums WNL.Marland Kitchen Moist mucosa without lesions. Neck supple and nontender. No palpable supraclavicular or cervical adenopathy. Normal sized without goiter. Respiratory WNL. No retractions.. Cardiovascular Pedal Pulses WNL. No clubbing, cyanosis or edema. Lymphatic No adneopathy. No adenopathy. No adenopathy. Musculoskeletal Adexa without tenderness or enlargement.. Digits and nails w/o clubbing, cyanosis, infection, petechiae, ischemia, or inflammatory conditions.. Integumentary (Hair, Skin) No suspicious lesions. No crepitus or fluctuance. No peri-wound warmth or erythema. No masses.Marland Kitchen Psychiatric Judgement and insight Intact.. No evidence of depression, anxiety, or agitation.. Notes the left lower extremity wound is looking much cleaner has minimal subcutaneous debris which have sharply removed with #3 curet and bleeding controlled with pressure Electronic Signature(s) Signed: 09/12/2016 3:47:30 PM By: Evlyn Kanner MD, FACS Entered By: Evlyn Kanner on 09/12/2016 15:47:29 Tamara Campbell ([id number]) -------------------------------------------------------------------------------- Physician Orders Details Patient Name: Tamara Campbell. Date of Service: 09/12/2016 3:00 PM Medical Record Number: [id number] Patient Account Number: [id number] Date of Birth/Sex: [birth year] (83 y.o. 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 o May Shower, gently pat wound dry prior to applying new dressing. Anesthetic Wound #1 Left,Anterior Lower Leg o Topical Lidocaine 4% cream applied to wound bed prior to debridement - clinic use only Skin Barriers/Peri-Wound Care Wound #1 Left,Anterior Lower Leg o Moisturizing lotion Primary Wound Dressing Wound #1 Left,Anterior Lower Leg o Hydrafera Blue Secondary Dressing Wound #1 Left,Anterior Lower Leg o ABD pad Dressing Change Frequency Wound #1 Left,Anterior Lower Leg o Change dressing every week Follow-up Appointments Wound #1 Left,Anterior Lower Leg Tamara Campbell, Tamara Campbell C. ([id number]) o Return Appointment in 1 week. Edema Control Wound #1 Left,Anterior Lower Leg o 3 Layer Compression System - Left  Lower Extremity - unna to anchor o Patient to wear own Juxtalite/Juzo compression garment. - on right leg o Elevate legs to the level of the heart and pump ankles as often as possible Additional Orders / Instructions Wound #1 Left,Anterior Lower Leg o Increase protein intake. Electronic Signature(s) Signed: 09/12/2016 4:12:24 PM By: Evlyn Kanner MD, FACS Signed: 09/12/2016 4:45:37 PM By: Elliot Gurney RN, BSN, Kim RN, BSN Entered By: Elliot Gurney, RN, BSN, Kim on 09/12/2016 15:48:11 Tamara Campbell ([id number]) -------------------------------------------------------------------------------- Problem List Details Patient Name: Tamara Campbell, Tamara Campbell. Date of Service: 09/12/2016 3:00 PM Medical Record Number: [id number] Patient Account Number: [id number] Date of Birth/Sex: 1930/06/13 (83 y.o. Female) Treating RN: Huel Coventry Primary Care Provider: Jodi Mourning Other Clinician: Referring Provider: Jodi Mourning Treating Provider/Extender: Rudene Re in Treatment: 5 Active Problems ICD-10 Encounter Code Description  Active Date Diagnosis E11.622 Type 2 diabetes mellitus with other skin ulcer 08/08/2016 Yes I89.0 Lymphedema, not elsewhere classified 08/08/2016 Yes L97.221 Non-pressure chronic ulcer of left calf limited to 08/08/2016 Yes breakdown of skin L97.211 Non-pressure chronic ulcer of right calf limited to 08/08/2016 Yes breakdown of skin Inactive Problems Resolved Problems Electronic Signature(s) Signed: 09/12/2016 3:41:18 PM By: Evlyn Kanner MD, FACS Entered By: Evlyn Kanner on 09/12/2016 15:41:18 Tamara Campbell ([id number]) -------------------------------------------------------------------------------- Progress Note Details Patient Name: Tamara Campbell. Date of Service: 09/12/2016 3:00 PM Medical Record Number: [id number] Patient Account Number: [id number] Date of Birth/Sex: 11/15/1930 (83 y.o. Female) Treating RN: Huel Coventry Primary Care Provider: Jodi Mourning Other Clinician: Referring Provider: Jodi Mourning Treating Provider/Extender: Rudene Re in Treatment: 5 Subjective Chief Complaint Information obtained from Patient Patient presents to the wound care center for a consult due non healing wound both lower extremities with swelling for the last 2 weeks History of Present Illness (HPI) The following HPI elements were documented for the patient's wound: Location: both lower extremities Quality: Patient reports No Pain. Severity: Patient states wound (s) are getting better. Duration: Patient has had the wound for < 2 weeks prior to presenting for treatment Context: The wound appeared gradually over time Modifying Factors: Other treatment(s) tried include:Silvadene cream and compression wraps Associated Signs and Symptoms: Patient reports having increase swelling. 83 year old patient who has been sent to see [place] by her PCP Dr. Yates Decamp, for open wounds on her left lower extremity, limited to breakdown of skin, possibly due to venous stasis ulcer, has a past  medical history of diabetes mellitus type 2, status post cardiac pacemaker, essential hypertension, atrial fibrillation, CHF, cardiomyopathy, ventricular tachycardia, status post right total knee arthroplasty in 2014, back surgery in 1996, hysterectomy. She has never been a smoker. her PCP found that she had stasis edema of both lower extremities left worse than right and had ulceration on the lateral aspect of her left leg. Last hemoglobin A1c done and February of this year was 6.3% patient's daughter tells me that she has seen at [facility] for an opinion about a year ago and hence we will try and obtain these reports regarding the workup done and the recommendations made. 08/22/2016 -- -- the patient was seen by Dr. Gilda Crease on 06/22/2015 and after examination he made a note of her atherosclerosis of the lower extremities with ulceration and recommended noninvasive studies. The patient was seen back in follow-up on 07/06/2015 and her right ABI was 1.13 and the left was 1.20 with triphasic pedal signals bilaterally. Duplex ultrasound of the left lower extremity showed SFA, popliteal and tibial arteries with  diffuse disease but no focal stenosis and triphasic. His impression was since these arterial studies were normal he thought the problem for the ulceration was more venous then a peripheral arterial disease. Commended to continue with graduated compression and follow back in 3 months. Tamara Campbell, Tamara Campbell ([id number]) Objective Constitutional Pulse regular. Respirations normal and unlabored. Afebrile. Vitals Time Taken: 3:15 PM, Height: 62 in, Weight: 130 lbs, BMI: 23.8, Temperature: 98.2 F, Pulse: 70 bpm, Respiratory Rate: 16 breaths/min, Blood Pressure: 148/83 mmHg. Eyes Nonicteric. Reactive to light. Ears, Nose, Mouth, and Throat Lips, teeth, and gums WNL.Marland Kitchen Moist mucosa without lesions. Neck supple and nontender. No palpable supraclavicular or cervical adenopathy.  Normal sized without goiter. Respiratory WNL. No retractions.. Cardiovascular Pedal Pulses WNL. No clubbing, cyanosis or edema. Lymphatic No adneopathy. No adenopathy. No adenopathy. Musculoskeletal Adexa without tenderness or enlargement.. Digits and nails w/o clubbing, cyanosis, infection, petechiae, ischemia, or inflammatory conditions.Marland Kitchen Psychiatric Judgement and insight Intact.. No evidence of depression, anxiety, or agitation.. General Notes: the left lower extremity wound is looking much cleaner has minimal subcutaneous debris which have sharply removed with #3 curet and bleeding controlled with pressure Integumentary (Hair, Skin) No suspicious lesions. No crepitus or fluctuance. No peri-wound warmth or erythema. No masses.. Wound #1 status is Open. Original cause of wound was Blister. The wound is located on the Left,Anterior Lower Leg. The wound measures 0.5cm length x 0.4cm width x 0.1cm depth; 0.157cm^2 area and 0.016cm^3 volume. There is Fat Layer (Subcutaneous Tissue) Exposed exposed. There is no tunneling or undermining noted. There is a medium amount of serosanguineous drainage noted. The wound margin is distinct with the outline attached to the wound base. There is large (67-100%) red granulation within the wound bed. There is a small (1-33%) amount of necrotic tissue within the wound bed including Adherent Tamara Campbell, Tamara Campbell C. ([id number]) Slough. The periwound skin appearance exhibited: Induration, Ecchymosis, Hemosiderin Staining. The periwound skin appearance did not exhibit: Callus, Crepitus, Excoriation, Rash, Scarring, Dry/Scaly, Maceration, Atrophie Blanche, Cyanosis, Mottled, Pallor, Rubor, Erythema. Periwound temperature was noted as No Abnormality. The periwound has tenderness on palpation. Assessment Active Problems ICD-10 E11.622 - Type 2 diabetes mellitus with other skin ulcer I89.0 - Lymphedema, not elsewhere classified L97.221 - Non-pressure chronic ulcer of  left calf limited to breakdown of skin L97.211 - Non-pressure chronic ulcer of right calf limited to breakdown of skin Procedures Wound #1 Wound #1 is a Diabetic Wound/Ulcer of the Lower Extremity located on the Left,Anterior Lower Leg . There was a Skin/Subcutaneous Tissue Debridement ([id number]) debridement with total area of 0.2 sq cm performed by Evlyn Kanner, MD. with the following instrument(s): Curette to remove Viable and Non-Viable tissue/material including Fibrin/Slough and Subcutaneous after achieving pain control using Lidocaine 4% Topical Solution. A time out was conducted at 03:35, prior to the start of the procedure. A Minimum amount of bleeding was controlled with Pressure. The procedure was tolerated well with a pain level of 0 throughout and a pain level of 0 following the procedure. Post Debridement Measurements: 0.5cm length x 0.4cm width x 0.2cm depth; 0.031cm^3 volume. Character of Wound/Ulcer Post Debridement requires further debridement. Severity of Tissue Post Debridement is: Fat layer exposed. Post procedure Diagnosis Wound #1: Same as Pre-Procedure Plan After review I have recommended: TALLYN, HOLROYD C. ([id number]) 1. Hydrofera blue with a 3 layer Profore to be applied to the left lower extremities and keep it unchanged for the week. 2. juxta light compressions to be used on the right lower extremity and we will order  these for bilateral lower legs. 3. elevation and exercise discussed with her in detail. 4. Regular visits the wound center Electronic Signature(s) Signed: 09/12/2016 4:13:28 PM By: Evlyn Kanner MD, FACS Previous Signature: 09/12/2016 3:48:16 PM Version By: Evlyn Kanner MD, FACS Entered By: Evlyn Kanner on 09/12/2016 16:13:28 Tamara Campbell ([id number]) -------------------------------------------------------------------------------- SuperBill Details Patient Name: Tamara Campbell Date of Service: 09/12/2016 Medical Record Number:  [id number] Patient Account Number: [id number] Date of Birth/Sex: Mar 24, 1931 (83 y.o. Female) Treating RN: Huel Coventry Primary Care Provider: Jodi Mourning Other Clinician: Referring Provider: Jodi Mourning Treating Provider/Extender: Rudene Re in Treatment: 5 Diagnosis Coding ICD-10 Codes Code Description [id number] Type 2 diabetes mellitus with other skin ulcer I89.0 Lymphedema, not elsewhere classified L97.221 Non-pressure chronic ulcer of left calf limited to breakdown of skin L97.211 Non-pressure chronic ulcer of right calf limited to breakdown of skin Facility Procedures CPT4 Code Description: [id number] 11042 - DEB SUBQ TISSUE 20 SQ CM/< ICD-10 Description Diagnosis E11.622 Type 2 diabetes mellitus with other skin ulcer I89.0 Lymphedema, not elsewhere classified L97.221 Non-pressure chronic ulcer of left calf limited to b  L97.211 Non-pressure chronic ulcer of right calf limited to Modifier: reakdown of s breakdown of Quantity: 1 kin skin Physician Procedures CPT4 Code Description: [id number] 11042 - WC PHYS SUBQ TISS 20 SQ CM ICD-10 Description Diagnosis E11.622 Type 2 diabetes mellitus with other skin ulcer I89.0 Lymphedema, not elsewhere classified L97.221 Non-pressure chronic ulcer of left calf limited to b  L97.211 Non-pressure chronic ulcer of right calf limited to Modifier: reakdown of s breakdown of Quantity: 1 kin skin Electronic Signature(s) Signed: 09/12/2016 4:13:40 PM By: Evlyn Kanner MD, FACS Previous Signature: 09/12/2016 4:12:24 PM Version By: Evlyn Kanner MD, FACS Previous Signature: 09/12/2016 3:50:03 PM Version By: Evlyn Kanner MD, FACS Entered By: Evlyn Kanner on 09/12/2016 16:13:40

## 2016-09-13 ENCOUNTER — Encounter: Payer: Self-pay | Admitting: Neurology

## 2016-09-13 NOTE — Progress Notes (Signed)
NANI, INGRAM ([id number]) Visit Report for 09/12/2016 Arrival Information Details Patient Name: LYANA, ASBILL. Date of Service: 09/12/2016 3:00 PM Medical Record Number: [id number] Patient Account Number: [id number] Date of Birth/Sex: [birth year] (83 y.o. Female) Treating RN: Huel Coventry Primary Care Matthieu Loftus: Jodi Mourning Other Clinician: Referring Choice Kleinsasser: Jodi Mourning Treating Allis Quirarte/Extender: Rudene Re in Treatment: 5 Visit Information History Since Last Visit Added or deleted any medications: No Patient Arrived: [place] Any new allergies or adverse reactions: No Arrival Time: 15:14 Had a fall or experienced change in No Accompanied By: daughter activities of daily living that may affect Transfer Assistance: None risk of falls: Patient Identification Verified: Yes Signs or symptoms of abuse/neglect since last No Secondary Verification Process Yes visito Completed: Hospitalized since last visit: No Patient Requires No Has Dressing in Place as Prescribed: Yes Transmission-Based Has Compression in Place as Prescribed: Yes Precautions: Pain Present Now: No Patient Has Alerts: Yes Patient Alerts: Patient on Blood Thinner Eliquis DM II R ABI noncompressible Electronic Signature(s) Signed: 09/12/2016 4:45:37 PM By: Elliot Gurney, RN, BSN, Kim RN, BSN Entered By: Elliot Gurney, RN, BSN, Kim on 09/12/2016 15:15:35 [name] ([id number]) -------------------------------------------------------------------------------- Encounter Discharge Information Details Patient Name: [name]. Date of Service: 09/12/2016 3:00 PM Medical Record Number: [id number] Patient Account Number: [id number] Date of Birth/Sex: 1930/09/20 (83 y.o. Female) Treating RN: Huel Coventry Primary Care Phuong Moffatt: Jodi Mourning Other Clinician: Referring Souleymane Saiki: Jodi Mourning Treating Shaniqua Guillot/Extender: Rudene Re in Treatment: 5 Encounter Discharge Information Items Discharge  Pain Level: 0 Discharge Condition: Stable Ambulatory Status: [place] Discharge Destination: Home Transportation: Private Auto Accompanied By: daughter Schedule Follow-up Appointment: Yes Medication Reconciliation completed Yes and provided to Patient/Care Rodger Giangregorio: Provided on Clinical Summary of Care: 09/12/2016 Form Type Recipient Paper Patient AW Electronic Signature(s) Signed: 09/12/2016 4:45:37 PM By: Elliot Gurney RN, BSN, Kim RN, BSN Previous Signature: 09/12/2016 3:48:50 PM Version By: Gwenlyn Perking Entered By: Elliot Gurney RN, BSN, Kim on 09/12/2016 15:49:49 [name] ([id number]) -------------------------------------------------------------------------------- Lower Extremity Assessment Details Patient Name: [name]. Date of Service: 09/12/2016 3:00 PM Medical Record Number: [id number] Patient Account Number: [id number] Date of Birth/Sex: 26-Apr-1931 (83 y.o. Female) Treating RN: Huel Coventry Primary Care Avaiah Stempel: Jodi Mourning Other Clinician: Referring Laquetta Racey: Jodi Mourning Treating Apple Dearmas/Extender: Rudene Re in Treatment: 5 Vascular Assessment Pulses: Dorsalis Pedis Palpable: [Left:Yes] Posterior Tibial Extremity colors, hair growth, and conditions: Extremity Color: [Left:Hyperpigmented] Hair Growth on Extremity: [Left:No] Temperature of Extremity: [Left:Warm] Capillary Refill: [Left:< 3 seconds] Toe Nail Assessment Left: Right: Thick: No Discolored: No Deformed: No Improper Length and Hygiene: Yes Electronic Signature(s) Signed: 09/12/2016 4:45:37 PM By: Elliot Gurney, RN, BSN, Kim RN, BSN Entered By: Elliot Gurney, RN, BSN, Kim on 09/12/2016 15:31:18 [name] ([id number]) -------------------------------------------------------------------------------- Multi Wound Chart Details Patient Name: [name]. Date of Service: 09/12/2016 3:00 PM Medical Record Number: [id number] Patient Account Number: [id number] Date of Birth/Sex: [birth year] (83 y.o.  Female) Treating RN: Huel Coventry Primary Care Samantha Ragen: Jodi Mourning Other Clinician: Referring Courtez Twaddle: Jodi Mourning Treating Roxanne Orner/Extender: Rudene Re in Treatment: 5 Vital Signs Height(in): 62 Pulse(bpm): 70 Weight(lbs): 130 Blood Pressure 148/83 (mmHg): Body Mass Index(BMI): 24 Temperature(F): 98.2 Respiratory Rate 16 (breaths/min): Photos: [N/A:N/A] Wound Location: Left Lower Leg - Anterior N/A N/A Wounding Event: Blister N/A N/A Primary Etiology: Diabetic Wound/Ulcer of N/A N/A the Lower Extremity Comorbid History: Arrhythmia, Congestive N/A N/A Heart Failure, Hypertension, Type II Diabetes, Osteoarthritis Date Acquired: 08/01/2016 N/A N/A Weeks of Treatment: 5 N/A N/A Wound  Status: Open N/A N/A Measurements L x W x D 0.5x0.4x0.1 N/A N/A (cm) Area (cm) : 0.157 N/A N/A Volume (cm) : 0.016 N/A N/A % Reduction in Area: 99.70% N/A N/A % Reduction in Volume: 99.70% N/A N/A Classification: Grade 2 N/A N/A Exudate Amount: Medium N/A N/A Exudate Type: Serosanguineous N/A N/A Exudate Color: red, brown N/A N/A Wound Margin: Distinct, outline attached N/A N/A Granulation Amount: Large (67-100%) N/A N/A Granulation Quality: Red N/A N/A Link, Ame C. ([id number]) Necrotic Amount: Small (1-33%) N/A N/A Exposed Structures: Fat Layer (Subcutaneous N/A N/A Tissue) Exposed: Yes Fascia: No Tendon: No Muscle: No Joint: No Bone: No Epithelialization: Small (1-33%) N/A N/A Periwound Skin Texture: Induration: Yes N/A N/A Excoriation: No Callus: No Crepitus: No Rash: No Scarring: No Periwound Skin Maceration: No N/A N/A Moisture: Dry/Scaly: No Periwound Skin Color: Ecchymosis: Yes N/A N/A Hemosiderin Staining: Yes Atrophie Blanche: No Cyanosis: No Erythema: No Mottled: No Pallor: No Rubor: No Temperature: No Abnormality N/A N/A Tenderness on Yes N/A N/A Palpation: Wound Preparation: Ulcer Cleansing: N/A N/A Rinsed/Irrigated  with Saline Topical Anesthetic Applied: Other: lidocaine 4% Treatment Notes Electronic Signature(s) Signed: 09/12/2016 3:43:43 PM By: Evlyn Kanner MD, FACS Entered By: Evlyn Kanner on 09/12/2016 15:43:42 [name] ([id number]) -------------------------------------------------------------------------------- Multi-Disciplinary Care Plan Details Patient Name: [name] Date of Service: 09/12/2016 3:00 PM Medical Record Number: [id number] Patient Account Number: [id number] Date of Birth/Sex: 22-Jul-1930 (83 y.o. Female) Treating RN: Huel Coventry Primary Care Amerie Beaumont: Jodi Mourning Other Clinician: Referring Jamil Armwood: Jodi Mourning Treating Odessia Asleson/Extender: Rudene Re in Treatment: 5 Active Inactive ` Abuse / Safety / Falls / Self Care Management Nursing Diagnoses: Potential for falls Goals: Patient will remain injury free Date Initiated: 08/08/2016 Target Resolution Date: 11/12/2016 Goal Status: Active Interventions: Assess fall risk on admission and as needed Assess impairment of mobility on admission and as needed per policy Notes: ` Nutrition Nursing Diagnoses: Imbalanced nutrition Impaired glucose control: actual or potential Goals: Patient/caregiver agrees to and verbalizes understanding of need to use nutritional supplements and/or vitamins as prescribed Date Initiated: 08/08/2016 Target Resolution Date: 12/10/2016 Goal Status: Active Patient/caregiver verbalizes understanding of need to maintain therapeutic glucose control per primary care physician Date Initiated: 08/08/2016 Target Resolution Date: 12/10/2016 Goal Status: Active Interventions: Assess HgA1c results as ordered upon admission and as needed Assess patient nutrition upon admission and as needed per policy ZAKYA, HALABI ([id number]) Notes: ` Orientation to the Wound Care Program Nursing Diagnoses: Knowledge deficit related to the wound healing center  program Goals: Patient/caregiver will verbalize understanding of the Wound Healing Center Program Date Initiated: 08/08/2016 Target Resolution Date: 09/10/2016 Goal Status: Active Interventions: Provide education on orientation to the wound center Notes: ` Pain, Acute or Chronic Nursing Diagnoses: Pain, acute or chronic: actual or potential Potential alteration in comfort, pain Goals: Patient/caregiver will verbalize adequate pain control between visits Date Initiated: 08/08/2016 Target Resolution Date: 12/10/2016 Goal Status: Active Interventions: Complete pain assessment as per visit requirements Notes: ` Wound/Skin Impairment Nursing Diagnoses: Impaired tissue integrity Knowledge deficit related to ulceration/compromised skin integrity Goals: Ulcer/skin breakdown will have a volume reduction of 80% by week 12 Date Initiated: 08/08/2016 Target Resolution Date: 12/10/2016 Goal Status: Active IZABELL, SCHALK ([id number]) Interventions: Assess patient/caregiver ability to perform ulcer/skin care regimen upon admission and as needed Assess ulceration(s) every visit Notes: Electronic Signature(s) Signed: 09/12/2016 4:45:37 PM By: Elliot Gurney, RN, BSN, Kim RN, BSN Entered By: Elliot Gurney, RN, BSN, Kim on 09/12/2016 15:34:52 [name] ([id number]) -------------------------------------------------------------------------------- Pain Assessment  Details Patient Name: XELA, OREGEL. Date of Service: 09/12/2016 3:00 PM Medical Record Number: [id number] Patient Account Number: [id number] Date of Birth/Sex: [birth year] (83 y.o. Female) Treating RN: Huel Coventry Primary Care Yuniel Blaney: Jodi Mourning Other Clinician: Referring Lyzette Reinhardt: Jodi Mourning Treating Gerrica Cygan/Extender: Rudene Re in Treatment: 5 Active Problems Location of Pain Severity and Description of Pain Patient Has Paino No Site Locations With Dressing Change: No Pain Management and Medication Current Pain  Management: Goals for Pain Management Topical or injectable lidocaine is offered to patient for acute pain when surgical debridement is performed. If needed, Patient is instructed to use over the counter pain medication for the following 24-48 hours after debridement. Wound care MDs do not prescribed pain medications. Patient has chronic pain or uncontrolled pain. Patient has been instructed to make an appointment with their Primary Care Physician for pain management. Electronic Signature(s) Signed: 09/12/2016 4:45:37 PM By: Elliot Gurney, RN, BSN, Kim RN, BSN Entered By: Elliot Gurney, RN, BSN, Kim on 09/12/2016 15:15:45 [name] ([id number]) -------------------------------------------------------------------------------- Patient/Caregiver Education Details Patient Name: [name] Date of Service: 09/12/2016 3:00 PM Medical Record Number: [id number] Patient Account Number: [id number] Date of Birth/Gender: 07-31-1930 (83 y.o. Female) Treating RN: Huel Coventry Primary Care Physician: Jodi Mourning Other Clinician: Referring Physician: Jodi Mourning Treating Physician/Extender: Rudene Re in Treatment: 5 Education Assessment Education Provided To: Patient Education Topics Provided Venous: Handouts: Controlling Swelling with Compression Stockings Methods: Demonstration, Explain/Verbal Responses: State content correctly Wound/Skin Impairment: Handouts: Caring for Your Ulcer Electronic Signature(s) Signed: 09/12/2016 4:45:37 PM By: Elliot Gurney, RN, BSN, Kim RN, BSN Entered By: Elliot Gurney, RN, BSN, Kim on 09/12/2016 15:51:01 [name] ([id number]) -------------------------------------------------------------------------------- Wound Assessment Details Patient Name: TERINA, MCELHINNY. Date of Service: 09/12/2016 3:00 PM Medical Record Number: [id number] Patient Account Number: [id number] Date of Birth/Sex: Nov 01, 1930 (83 y.o. Female) Treating RN: Huel Coventry Primary Care Raylynn Hersh: Jodi Mourning Other Clinician: Referring Derrik Mceachern: Jodi Mourning Treating Merary Garguilo/Extender: Rudene Re in Treatment: 5 Wound Status Wound Number: 1 Primary Diabetic Wound/Ulcer of the Lower Etiology: Extremity Wound Location: Left Lower Leg - Anterior Wound Open Wounding Event: Blister Status: Date Acquired: 08/01/2016 Comorbid Arrhythmia, Congestive Heart Failure, Weeks Of Treatment: 5 History: Hypertension, Type II Diabetes, Clustered Wound: No Osteoarthritis Photos Wound Measurements Length: (cm) 0.5 Width: (cm) 0.4 Depth: (cm) 0.1 Area: (cm) 0.157 Volume: (cm) 0.016 % Reduction in Area: 99.7% % Reduction in Volume: 99.7% Epithelialization: Small (1-33%) Tunneling: No Undermining: No Wound Description Classification: Grade 2 Foul Odor Aft Wound Margin: Distinct, outline attached Slough/Fibrin Exudate Amount: Medium Exudate Type: Serosanguineous Exudate Color: red, brown er Cleansing: No o No Wound Bed Granulation Amount: Large (67-100%) Exposed Structure Granulation Quality: Red Fascia Exposed: No Necrotic Amount: Small (1-33%) Fat Layer (Subcutaneous Tissue) Exposed: Yes Necrotic Quality: Adherent Slough Tendon Exposed: No Muscle Exposed: No Joint Exposed: No Bone Exposed: No Antonio, Jennessa C. ([id number]) Periwound Skin Texture Texture Color No Abnormalities Noted: No No Abnormalities Noted: No Callus: No Atrophie Blanche: No Crepitus: No Cyanosis: No Excoriation: No Ecchymosis: Yes Induration: Yes Erythema: No Rash: No Hemosiderin Staining: Yes Scarring: No Mottled: No Pallor: No Moisture Rubor: No No Abnormalities Noted: No Dry / Scaly: No Temperature / Pain Maceration: No Temperature: No Abnormality Tenderness on Palpation: Yes Wound Preparation Ulcer Cleansing: Rinsed/Irrigated with Saline Topical Anesthetic Applied: Other: lidocaine 4%, Treatment Notes Wound #1 (Left, Anterior Lower Leg) 1. Cleansed with: Cleanse  wound with antibacterial soap and water 2. Anesthetic Topical Lidocaine 4% cream  to wound bed prior to debridement 4. Dressing Applied: Hydrafera Blue 5. Secondary Dressing Applied ABD Pad 7. Secured with 3 Layer Compression System - Left Lower Extremity Notes unna to Ecologist) Signed: 09/12/2016 4:45:37 PM By: Elliot Gurney, RN, BSN, Kim RN, BSN Entered By: Elliot Gurney, RN, BSN, Kim on 09/12/2016 15:29:22 [name] ([id number]) -------------------------------------------------------------------------------- Vitals Details Patient Name: [name] Date of Service: 09/12/2016 3:00 PM Medical Record Number: [id number] Patient Account Number: [id number] Date of Birth/Sex: [birth year] (83 y.o. Female) Treating RN: Huel Coventry Primary Care Marilin Kofman: Jodi Mourning Other Clinician: Referring Neiva Maenza: Jodi Mourning Treating Shawnya Mayor/Extender: Rudene Re in Treatment: 5 Vital Signs Time Taken: 15:15 Temperature (F): 98.2 Height (in): 62 Pulse (bpm): 70 Weight (lbs): 130 Respiratory Rate (breaths/min): 16 Body Mass Index (BMI): 23.8 Blood Pressure (mmHg): 148/83 Reference Range: 80 - 120 mg / dl Electronic Signature(s) Signed: 09/12/2016 4:45:37 PM By: Elliot Gurney, RN, BSN, Kim RN, BSN Entered By: Elliot Gurney, RN, BSN, Kim on 09/12/2016 15:16:03

## 2016-09-19 ENCOUNTER — Encounter: Payer: Medicare HMO | Admitting: Surgery

## 2016-09-19 DIAGNOSIS — I872 Venous insufficiency (chronic) (peripheral): Secondary | ICD-10-CM | POA: Diagnosis not present

## 2016-09-19 DIAGNOSIS — I4891 Unspecified atrial fibrillation: Secondary | ICD-10-CM | POA: Diagnosis not present

## 2016-09-19 DIAGNOSIS — Z950 Presence of cardiac pacemaker: Secondary | ICD-10-CM | POA: Diagnosis not present

## 2016-09-19 DIAGNOSIS — I110 Hypertensive heart disease with heart failure: Secondary | ICD-10-CM | POA: Diagnosis not present

## 2016-09-19 DIAGNOSIS — L97822 Non-pressure chronic ulcer of other part of left lower leg with fat layer exposed: Secondary | ICD-10-CM | POA: Diagnosis not present

## 2016-09-19 DIAGNOSIS — I509 Heart failure, unspecified: Secondary | ICD-10-CM | POA: Diagnosis not present

## 2016-09-19 DIAGNOSIS — L97211 Non-pressure chronic ulcer of right calf limited to breakdown of skin: Secondary | ICD-10-CM | POA: Diagnosis not present

## 2016-09-19 DIAGNOSIS — I890 Lymphedema, not elsewhere classified: Secondary | ICD-10-CM | POA: Diagnosis not present

## 2016-09-19 DIAGNOSIS — I429 Cardiomyopathy, unspecified: Secondary | ICD-10-CM | POA: Diagnosis not present

## 2016-09-19 DIAGNOSIS — E11622 Type 2 diabetes mellitus with other skin ulcer: Secondary | ICD-10-CM | POA: Diagnosis not present

## 2016-09-19 DIAGNOSIS — H40153 Residual stage of open-angle glaucoma, bilateral: Secondary | ICD-10-CM | POA: Diagnosis not present

## 2016-09-19 DIAGNOSIS — L97221 Non-pressure chronic ulcer of left calf limited to breakdown of skin: Secondary | ICD-10-CM | POA: Diagnosis not present

## 2016-09-19 NOTE — Progress Notes (Signed)
VASHON, ARCH ([id number]) Visit Report for 09/19/2016 Chief Complaint Document Details Patient Name: Tamara Campbell, Tamara Campbell. Date of Service: 09/19/2016 3:00 PM Medical Record Number: [id number] Patient Account Number: [id number] Date of Birth/Sex: 1930/12/17 (83 y.o. Female) Treating RN: Huel Coventry Primary Care Provider: Jodi Mourning Other Clinician: Referring Provider: Jodi Mourning Treating Provider/Extender: Rudene Re in Treatment: 6 Information Obtained from: Patient Chief Complaint Patient presents to the wound care center for a consult due non healing wound both lower extremities with swelling for the last 2 weeks Electronic Signature(s) Signed: 09/19/2016 3:38:44 PM By: Evlyn Kanner MD, FACS Entered By: Evlyn Kanner on 09/19/2016 15:38:44 Tamara Campbell ([id number]) -------------------------------------------------------------------------------- HPI Details Patient Name: Tamara Campbell. Date of Service: 09/19/2016 3:00 PM Medical Record Number: [id number] Patient Account Number: [id number] Date of Birth/Sex: 02-Jul-1930 (83 y.o. Female) Treating RN: Huel Coventry Primary Care Provider: Jodi Mourning Other Clinician: Referring Provider: Jodi Mourning Treating Provider/Extender: Rudene Re in Treatment: 6 History of Present Illness Location: both lower extremities Quality: Patient reports No Pain. Severity: Patient states wound (s) are getting better. Duration: Patient has had the wound for < 2 weeks prior to presenting for treatment Context: The wound appeared gradually over time Modifying Factors: Other treatment(s) tried include:Silvadene cream and compression wraps Associated Signs and Symptoms: Patient reports having increase swelling. HPI Description: 83 year old patient who has been sent to see [place] by her PCP Dr. Yates Decamp, for open wounds on her left lower extremity, limited to breakdown of skin, possibly due to venous stasis ulcer, has  a past medical history of diabetes mellitus type 2, status post cardiac pacemaker, essential hypertension, atrial fibrillation, CHF, cardiomyopathy, ventricular tachycardia, status post right total knee arthroplasty in 2014, back surgery in 1996, hysterectomy. She has never been a smoker. her PCP found that she had stasis edema of both lower extremities left worse than right and had ulceration on the lateral aspect of her left leg. Last hemoglobin A1c done and February of this year was 6.3% patient's daughter tells me that she has seen at [facility] for an opinion about a year ago and hence we will try and obtain these reports regarding the workup done and the recommendations made. 08/22/2016 -- -- the patient was seen by Dr. Gilda Crease on 06/22/2015 and after examination he made a note of her atherosclerosis of the lower extremities with ulceration and recommended noninvasive studies. The patient was seen back in follow-up on 07/06/2015 and her right ABI was 1.13 and the left was 1.20 with triphasic pedal signals bilaterally. Duplex ultrasound of the left lower extremity showed SFA, popliteal and tibial arteries with diffuse disease but no focal stenosis and triphasic. His impression was since these arterial studies were normal he thought the problem for the ulceration was more venous then a peripheral arterial disease. Commended to continue with graduated compression and follow back in 3 months. Electronic Signature(s) Signed: 09/19/2016 3:38:52 PM By: Evlyn Kanner MD, FACS Entered By: Evlyn Kanner on 09/19/2016 15:38:52 Tamara Campbell ([id number]) -------------------------------------------------------------------------------- Physical Exam Details Patient Name: Tamara Campbell Date of Service: 09/19/2016 3:00 PM Medical Record Number: [id number] Patient Account Number: [id number] Date of Birth/Sex: Nov 03, 1930 (83 y.o. Female) Treating RN: Huel Coventry Primary Care Provider:  Jodi Mourning Other Clinician: Referring Provider: Jodi Mourning Treating Provider/Extender: Rudene Re in Treatment: 6 Constitutional . Pulse regular. Respirations normal and unlabored. Afebrile. . Eyes Nonicteric. Reactive to light. Ears, Nose, Mouth, and Throat Lips, teeth, and  gums WNL.Marland Kitchen Moist mucosa without lesions. Neck supple and nontender. No palpable supraclavicular or cervical adenopathy. Normal sized without goiter. Respiratory WNL. No retractions.. Cardiovascular Pedal Pulses WNL. No clubbing, cyanosis or edema. Lymphatic No adneopathy. No adenopathy. No adenopathy. Musculoskeletal Adexa without tenderness or enlargement.. Digits and nails w/o clubbing, cyanosis, infection, petechiae, ischemia, or inflammatory conditions.. Integumentary (Hair, Skin) No suspicious lesions. No crepitus or fluctuance. No peri-wound warmth or erythema. No masses.Marland Kitchen Psychiatric Judgement and insight Intact.. No evidence of depression, anxiety, or agitation.. Notes the wound did not need any sharp debridement but some of the exudate was removed by moist saline gauze and there is healthy granulation tissue. Electronic Signature(s) Signed: 09/19/2016 3:39:24 PM By: Evlyn Kanner MD, FACS Entered By: Evlyn Kanner on 09/19/2016 15:39:24 Tamara Campbell ([id number]) -------------------------------------------------------------------------------- Physician Orders Details Patient Name: Tamara Campbell Date of Service: 09/19/2016 3:00 PM Medical Record Number: [id number] Patient Account Number: [id number] Date of Birth/Sex: 1931-03-05 (83 y.o. Female) Treating RN: Huel Coventry Primary Care Provider: Jodi Mourning Other Clinician: Referring Provider: Jodi Mourning Treating Provider/Extender: Rudene Re in Treatment: 6 Verbal / Phone Orders: No Diagnosis Coding ICD-10 Coding Code Description E11.622 Type 2 diabetes mellitus with other skin ulcer I89.0 Lymphedema,  not elsewhere classified L97.221 Non-pressure chronic ulcer of left calf limited to breakdown of skin L97.211 Non-pressure chronic ulcer of right calf limited to breakdown of skin Electronic Signature(s) Signed: 09/19/2016 3:39:35 PM By: Evlyn Kanner MD, FACS Entered By: Evlyn Kanner on 09/19/2016 15:39:35 Tamara Campbell ([id number]) -------------------------------------------------------------------------------- Problem List Details Patient Name: Tamara Campbell. Date of Service: 09/19/2016 3:00 PM Medical Record Number: [id number] Patient Account Number: [id number] Date of Birth/Sex: 01-08-1931 (83 y.o. Female) Treating RN: Huel Coventry Primary Care Provider: Jodi Mourning Other Clinician: Referring Provider: Jodi Mourning Treating Provider/Extender: Rudene Re in Treatment: 6 Active Problems ICD-10 Encounter Code Description Active Date Diagnosis E11.622 Type 2 diabetes mellitus with other skin ulcer 08/08/2016 Yes I89.0 Lymphedema, not elsewhere classified 08/08/2016 Yes L97.221 Non-pressure chronic ulcer of left calf limited to 08/08/2016 Yes breakdown of skin L97.211 Non-pressure chronic ulcer of right calf limited to 08/08/2016 Yes breakdown of skin Inactive Problems Resolved Problems Electronic Signature(s) Signed: 09/19/2016 3:38:30 PM By: Evlyn Kanner MD, FACS Entered By: Evlyn Kanner on 09/19/2016 15:38:30 Tamara Campbell ([id number]) -------------------------------------------------------------------------------- Progress Note Details Patient Name: Tamara Campbell Date of Service: 09/19/2016 3:00 PM Medical Record Number: [id number] Patient Account Number: [id number] Date of Birth/Sex: 1930-09-11 (83 y.o. Female) Treating RN: Huel Coventry Primary Care Provider: Jodi Mourning Other Clinician: Referring Provider: Jodi Mourning Treating Provider/Extender: Rudene Re in Treatment: 6 Subjective Chief Complaint Information obtained from  Patient Patient presents to the wound care center for a consult due non healing wound both lower extremities with swelling for the last 2 weeks History of Present Illness (HPI) The following HPI elements were documented for the patient's wound: Location: both lower extremities Quality: Patient reports No Pain. Severity: Patient states wound (s) are getting better. Duration: Patient has had the wound for < 2 weeks prior to presenting for treatment Context: The wound appeared gradually over time Modifying Factors: Other treatment(s) tried include:Silvadene cream and compression wraps Associated Signs and Symptoms: Patient reports having increase swelling. 83 year old patient who has been sent to see [place] by her PCP Dr. Yates Decamp, for open wounds on her left lower extremity, limited to breakdown of skin, possibly due to venous stasis ulcer, has a past medical history of diabetes mellitus  type 2, status post cardiac pacemaker, essential hypertension, atrial fibrillation, CHF, cardiomyopathy, ventricular tachycardia, status post right total knee arthroplasty in 2014, back surgery in 1996, hysterectomy. She has never been a smoker. her PCP found that she had stasis edema of both lower extremities left worse than right and had ulceration on the lateral aspect of her left leg. Last hemoglobin A1c done and February of this year was 6.3% patient's daughter tells me that she has seen at [facility] for an opinion about a year ago and hence we will try and obtain these reports regarding the workup done and the recommendations made. 08/22/2016 -- -- the patient was seen by Dr. Gilda Crease on 06/22/2015 and after examination he made a note of her atherosclerosis of the lower extremities with ulceration and recommended noninvasive studies. The patient was seen back in follow-up on 07/06/2015 and her right ABI was 1.13 and the left was 1.20 with triphasic pedal signals bilaterally. Duplex  ultrasound of the left lower extremity showed SFA, popliteal and tibial arteries with diffuse disease but no focal stenosis and triphasic. His impression was since these arterial studies were normal he thought the problem for the ulceration was more venous then a peripheral arterial disease. Commended to continue with graduated compression and follow back in 3 months. Tamara Campbell, Tamara Campbell ([id number]) Objective Constitutional Pulse regular. Respirations normal and unlabored. Afebrile. Vitals Time Taken: 3:05 PM, Height: 62 in, Weight: 130 lbs, BMI: 23.8, Temperature: 97.6 F, Pulse: 73 bpm, Respiratory Rate: 16 breaths/min, Blood Pressure: 158/82 mmHg. Eyes Nonicteric. Reactive to light. Ears, Nose, Mouth, and Throat Lips, teeth, and gums WNL.Marland Kitchen Moist mucosa without lesions. Neck supple and nontender. No palpable supraclavicular or cervical adenopathy. Normal sized without goiter. Respiratory WNL. No retractions.. Cardiovascular Pedal Pulses WNL. No clubbing, cyanosis or edema. Lymphatic No adneopathy. No adenopathy. No adenopathy. Musculoskeletal Adexa without tenderness or enlargement.. Digits and nails w/o clubbing, cyanosis, infection, petechiae, ischemia, or inflammatory conditions.Marland Kitchen Psychiatric Judgement and insight Intact.. No evidence of depression, anxiety, or agitation.. General Notes: the wound did not need any sharp debridement but some of the exudate was removed by moist saline gauze and there is healthy granulation tissue. Integumentary (Hair, Skin) No suspicious lesions. No crepitus or fluctuance. No peri-wound warmth or erythema. No masses.. Wound #1 status is Open. Original cause of wound was Blister. The wound is located on the Left,Anterior Lower Leg. The wound measures 0.5cm length x 0.4cm width x 0.1cm depth; 0.157cm^2 area and 0.016cm^3 volume. There is Fat Layer (Subcutaneous Tissue) Exposed exposed. There is a medium amount of serosanguineous drainage noted. The  wound margin is distinct with the outline attached to the wound base. There is large (67-100%) red granulation within the wound bed. There is a small (1-33%) amount of necrotic tissue within the wound bed including Adherent Slough. The periwound skin appearance Tamara Campbell, Tamara Campbell. ([id number]) exhibited: Induration, Ecchymosis, Hemosiderin Staining. The periwound skin appearance did not exhibit: Callus, Crepitus, Excoriation, Rash, Scarring, Dry/Scaly, Maceration, Atrophie Blanche, Cyanosis, Mottled, Pallor, Rubor, Erythema. Periwound temperature was noted as No Abnormality. The periwound has tenderness on palpation. Assessment Active Problems ICD-10 E11.622 - Type 2 diabetes mellitus with other skin ulcer I89.0 - Lymphedema, not elsewhere classified L97.221 - Non-pressure chronic ulcer of left calf limited to breakdown of skin L97.211 - Non-pressure chronic ulcer of right calf limited to breakdown of skin Plan After review I have recommended: 1. Foam with a 3 layer Profore to be applied to the left lower extremities and keep  it unchanged for the week. 2. juxta light compressions to be used on the right lower extremity and we will order these for bilateral lower legs. 3. elevation and exercise discussed with her in detail. 4. Regular visits the wound center Electronic Signature(s) Signed: 09/19/2016 3:40:13 PM By: Evlyn Kanner MD, FACS Entered By: Evlyn Kanner on 09/19/2016 15:40:13 Tamara Campbell ([id number]) -------------------------------------------------------------------------------- SuperBill Details Patient Name: Tamara Campbell Date of Service: 09/19/2016 Medical Record Number: [id number] Patient Account Number: [id number] Date of Birth/Sex: 1931/01/06 (83 y.o. Female) Treating RN: Huel Coventry Primary Care Provider: Jodi Mourning Other Clinician: Referring Provider: Jodi Mourning Treating Provider/Extender: Rudene Re in Treatment: 6 Diagnosis Coding ICD-10  Codes Code Description [phone] Type 2 diabetes mellitus with other skin ulcer I89.0 Lymphedema, not elsewhere classified L97.221 Non-pressure chronic ulcer of left calf limited to breakdown of skin L97.211 Non-pressure chronic ulcer of right calf limited to breakdown of skin Facility Procedures CPT4: Description Modifier Quantity Code [id number] (Facility Use Only) [phone] - APPLY MULTLAY COMPRS LWR LT 1 LEG Physician Procedures CPT4 Code Description: [id number] - WC PHYS LEVEL 3 - EST PT ICD-10 Description Diagnosis E11.622 Type 2 diabetes mellitus with other skin ulcer I89.0 Lymphedema, not elsewhere classified L97.221 Non-pressure chronic ulcer of left calf limited to  L97.211 Non-pressure chronic ulcer of right calf limited to Modifier: breakdown of sk breakdown of s Quantity: 1 in kin Electronic Signature(s) Signed: 09/19/2016 4:10:34 PM By: Evlyn Kanner MD, FACS Signed: 09/19/2016 5:45:26 PM By: Elliot Gurney RN, BSN, Kim RN, BSN Previous Signature: 09/19/2016 3:40:32 PM Version By: Evlyn Kanner MD, FACS Entered By: Elliot Gurney, RN, BSN, Kim on 09/19/2016 15:44:41

## 2016-09-20 NOTE — Progress Notes (Signed)
Tamara Campbell, Tamara Campbell ([id number]) Visit Report for 09/19/2016 Arrival Information Details Patient Name: Tamara Campbell, Tamara Campbell. Date of Service: 09/19/2016 3:00 PM Medical Record Number: [id number] Patient Account Number: [id number] Date of Birth/Sex: 02-07-1931 (83 y.o. Female) Treating RN: Huel Coventry Primary Care Misael Mcgaha: Jodi Mourning Other Clinician: Referring Tinika Bucknam: Jodi Mourning Treating Gloyd Happ/Extender: Rudene Re in Treatment: 6 Visit Information History Since Last Visit Added or deleted any medications: No Patient Arrived: [place] Any new allergies or adverse reactions: No Arrival Time: 15:03 Had a fall or experienced change in No Accompanied By: daughter activities of daily living that may affect Transfer Assistance: Manual risk of falls: Patient Identification Verified: Yes Signs or symptoms of abuse/neglect since last No Secondary Verification Process Yes visito Completed: Hospitalized since last visit: No Patient Requires No Has Dressing in Place as Prescribed: Yes Transmission-Based Pain Present Now: No Precautions: Patient Has Alerts: Yes Patient Alerts: Patient on Blood Thinner Eliquis DM II R ABI noncompressible Electronic Signature(s) Signed: 09/19/2016 5:45:26 PM By: Elliot Gurney, RN, BSN, Kim RN, BSN Entered By: Elliot Gurney, RN, BSN, Kim on 09/19/2016 15:09:25 Tamara Campbell ([id number]) -------------------------------------------------------------------------------- Encounter Discharge Information Details Patient Name: Tamara Campbell. Date of Service: 09/19/2016 3:00 PM Medical Record Number: [id number] Patient Account Number: [id number] Date of Birth/Sex: Dec 25, 1930 (83 y.o. Female) Treating RN: Huel Coventry Primary Care Renell Allum: Jodi Mourning Other Clinician: Referring Cyndal Kasson: Jodi Mourning Treating Ulyses Panico/Extender: Rudene Re in Treatment: 6 Encounter Discharge Information Items Discharge Pain Level: 0 Discharge Condition:  Stable Ambulatory Status: [place] Discharge Destination: Home Private Transportation: Auto daughter in Accompanied By: law Schedule Follow-up Appointment: Yes Medication Reconciliation completed and Yes provided to Patient/Care Emelina Hinch: Clinical Summary of Care: Electronic Signature(s) Signed: 09/19/2016 5:45:26 PM By: Elliot Gurney, RN, BSN, Kim RN, BSN Entered By: Elliot Gurney, RN, BSN, Kim on 09/19/2016 15:46:06 Tamara Campbell ([id number]) -------------------------------------------------------------------------------- Lower Extremity Assessment Details Patient Name: Tamara Campbell, Tamara Campbell. Date of Service: 09/19/2016 3:00 PM Medical Record Number: [id number] Patient Account Number: [id number] Date of Birth/Sex: 19-Aug-1930 (83 y.o. Female) Treating RN: Huel Coventry Primary Care Ah Bott: Jodi Mourning Other Clinician: Referring Rennie Hack: Jodi Mourning Treating Verlene Glantz/Extender: Rudene Re in Treatment: 6 Vascular Assessment Pulses: Dorsalis Pedis Palpable: [Left:Yes] Posterior Tibial Palpable: [Left:Yes] Extremity colors, hair growth, and conditions: Extremity Color: [Left:Hyperpigmented] Hair Growth on Extremity: [Left:No] Temperature of Extremity: [Left:Warm] Capillary Refill: [Left:< 3 seconds] Toe Nail Assessment Left: Right: Thick: No Discolored: No Deformed: No Improper Length and Hygiene: No Electronic Signature(s) Signed: 09/19/2016 5:45:26 PM By: Elliot Gurney, RN, BSN, Kim RN, BSN Entered By: Elliot Gurney, RN, BSN, Kim on 09/19/2016 15:25:40 Tamara Campbell ([id number]) -------------------------------------------------------------------------------- Multi Wound Chart Details Patient Name: Tamara Campbell. Date of Service: 09/19/2016 3:00 PM Medical Record Number: [id number] Patient Account Number: [id number] Date of Birth/Sex: 05/30/1931 (83 y.o. Female) Treating RN: Huel Coventry Primary Care Abryanna Musolino: Jodi Mourning Other Clinician: Referring Jaspal Pultz: Jodi Mourning Treating Jakhia Buxton/Extender: Rudene Re in Treatment: 6 Vital Signs Height(in): 62 Pulse(bpm): 73 Weight(lbs): 130 Blood Pressure 158/82 (mmHg): Body Mass Index(BMI): 24 Temperature(F): 97.6 Respiratory Rate 16 (breaths/min): Photos: [N/A:N/A] Wound Location: Left, Anterior Lower Leg N/A N/A Wounding Event: Blister N/A N/A Primary Etiology: Diabetic Wound/Ulcer of N/A N/A the Lower Extremity Comorbid History: Arrhythmia, Congestive N/A N/A Heart Failure, Hypertension, Type II Diabetes, Osteoarthritis Date Acquired: 08/01/2016 N/A N/A Weeks of Treatment: 6 N/A N/A Wound Status: Open N/A N/A Measurements L x W x D 0.5x0.4x0.1 N/A N/A (cm) Area (cm) : 0.157 N/A N/A Volume (cm) :  0.016 N/A N/A % Reduction in Area: 99.70% N/A N/A % Reduction in Volume: 99.70% N/A N/A Classification: Grade 2 N/A N/A Exudate Amount: Medium N/A N/A Exudate Type: Serosanguineous N/A N/A Exudate Color: red, brown N/A N/A Wound Margin: Distinct, outline attached N/A N/A Granulation Amount: Large (67-100%) N/A N/A Granulation Quality: Red N/A N/A Campbell, Tamara C. ([id number]) Necrotic Amount: Small (1-33%) N/A N/A Exposed Structures: Fat Layer (Subcutaneous N/A N/A Tissue) Exposed: Yes Fascia: No Tendon: No Muscle: No Joint: No Bone: No Epithelialization: Small (1-33%) N/A N/A Periwound Skin Texture: Induration: Yes N/A N/A Excoriation: No Callus: No Crepitus: No Rash: No Scarring: No Periwound Skin Maceration: No N/A N/A Moisture: Dry/Scaly: No Periwound Skin Color: Ecchymosis: Yes N/A N/A Hemosiderin Staining: Yes Atrophie Blanche: No Cyanosis: No Erythema: No Mottled: No Pallor: No Rubor: No Temperature: No Abnormality N/A N/A Tenderness on Yes N/A N/A Palpation: Wound Preparation: Ulcer Cleansing: N/A N/A Rinsed/Irrigated with Saline Topical Anesthetic Applied: Other: lidocaine 4% Treatment Notes Electronic Signature(s) Signed: 09/19/2016 5:45:26  PM By: Elliot Gurney RN, BSN, Kim RN, BSN Previous Signature: 09/19/2016 3:38:35 PM Version By: Evlyn Kanner MD, FACS Entered By: Elliot Gurney RN, BSN, Kim on 09/19/2016 15:44:14 Tamara Campbell ([id number]) -------------------------------------------------------------------------------- Multi-Disciplinary Care Plan Details Patient Name: Tamara Campbell, Tamara Campbell. Date of Service: 09/19/2016 3:00 PM Medical Record Number: [id number] Patient Account Number: [id number] Date of Birth/Sex: 06-Jan-1931 (83 y.o. Female) Treating RN: Huel Coventry Primary Care Indi Willhite: Jodi Mourning Other Clinician: Referring Siddhant Hashemi: Jodi Mourning Treating Keoni Risinger/Extender: Rudene Re in Treatment: 6 Active Inactive ` Abuse / Safety / Falls / Self Care Management Nursing Diagnoses: Potential for falls Goals: Patient will remain injury free Date Initiated: 08/08/2016 Target Resolution Date: 11/12/2016 Goal Status: Active Interventions: Assess fall risk on admission and as needed Assess impairment of mobility on admission and as needed per policy Notes: ` Nutrition Nursing Diagnoses: Imbalanced nutrition Impaired glucose control: actual or potential Goals: Patient/caregiver agrees to and verbalizes understanding of need to use nutritional supplements and/or vitamins as prescribed Date Initiated: 08/08/2016 Target Resolution Date: 12/10/2016 Goal Status: Active Patient/caregiver verbalizes understanding of need to maintain therapeutic glucose control per primary care physician Date Initiated: 08/08/2016 Target Resolution Date: 12/10/2016 Goal Status: Active Interventions: Assess HgA1c results as ordered upon admission and as needed Assess patient nutrition upon admission and as needed per policy JALEENA, VIVIANI ([id number]) Notes: ` Orientation to the Wound Care Program Nursing Diagnoses: Knowledge deficit related to the wound healing center program Goals: Patient/caregiver will verbalize understanding of the  Wound Healing Center Program Date Initiated: 08/08/2016 Target Resolution Date: 09/10/2016 Goal Status: Active Interventions: Provide education on orientation to the wound center Notes: ` Pain, Acute or Chronic Nursing Diagnoses: Pain, acute or chronic: actual or potential Potential alteration in comfort, pain Goals: Patient/caregiver will verbalize adequate pain control between visits Date Initiated: 08/08/2016 Target Resolution Date: 12/10/2016 Goal Status: Active Interventions: Complete pain assessment as per visit requirements Notes: ` Wound/Skin Impairment Nursing Diagnoses: Impaired tissue integrity Knowledge deficit related to ulceration/compromised skin integrity Goals: Ulcer/skin breakdown will have a volume reduction of 80% by week 12 Date Initiated: 08/08/2016 Target Resolution Date: 12/10/2016 Goal Status: Active Tamara Campbell, Tamara Campbell ([id number]) Interventions: Assess patient/caregiver ability to perform ulcer/skin care regimen upon admission and as needed Assess ulceration(s) every visit Notes: Electronic Signature(s) Signed: 09/19/2016 5:45:26 PM By: Elliot Gurney, RN, BSN, Kim RN, BSN Entered By: Elliot Gurney, RN, BSN, Kim on 09/19/2016 15:44:02 Tamara Campbell ([id number]) -------------------------------------------------------------------------------- Pain Assessment Details Patient Name: Tamara Campbell Date of  Service: 09/19/2016 3:00 PM Medical Record Number: [id number] Patient Account Number: [id number] Date of Birth/Sex: [birth year] (83 y.o. Female) Treating RN: Huel Coventry Primary Care Wendy Mikles: Jodi Mourning Other Clinician: Referring Jobeth Pangilinan: Jodi Mourning Treating Lynette Noah/Extender: Rudene Re in Treatment: 6 Active Problems Location of Pain Severity and Description of Pain Patient Has Paino No Site Locations Pain Management and Medication Current Pain Management: Electronic Signature(s) Signed: 09/19/2016 5:45:26 PM By: Elliot Gurney, RN, BSN, Kim RN,  BSN Entered By: Elliot Gurney, RN, BSN, Kim on 09/19/2016 15:09:36 Tamara Campbell ([id number]) -------------------------------------------------------------------------------- Patient/Caregiver Education Details Patient Name: Tamara Campbell Date of Service: 09/19/2016 3:00 PM Medical Record Number: [id number] Patient Account Number: [id number] Date of Birth/Gender: 15-Nov-1930 (83 y.o. Female) Treating RN: Huel Coventry Primary Care Physician: Jodi Mourning Other Clinician: Referring Physician: Jodi Mourning Treating Physician/Extender: Rudene Re in Treatment: 6 Education Assessment Education Provided To: Patient Education Topics Provided Venous: Controlling Swelling with Compression Stockings , Controlling Swelling with Multilayered Handouts: Compression Wraps Methods: Demonstration Responses: State content correctly Wound/Skin Impairment: Handouts: Caring for Your Ulcer Methods: Demonstration Responses: State content correctly Electronic Signature(s) Signed: 09/19/2016 5:45:26 PM By: Elliot Gurney, RN, BSN, Kim RN, BSN Entered By: Elliot Gurney, RN, BSN, Kim on 09/19/2016 15:46:24 Tamara Campbell ([id number]) -------------------------------------------------------------------------------- Wound Assessment Details Patient Name: Tamara Campbell. Date of Service: 09/19/2016 3:00 PM Medical Record Number: [id number] Patient Account Number: [id number] Date of Birth/Sex: 22-Dec-1930 (83 y.o. Female) Treating RN: Huel Coventry Primary Care Bena Kobel: Jodi Mourning Other Clinician: Referring Twanda Stakes: Jodi Mourning Treating Imajean Mcdermid/Extender: Rudene Re in Treatment: 6 Wound Status Wound Number: 1 Primary Diabetic Wound/Ulcer of the Lower Etiology: Extremity Wound Location: Left, Anterior Lower Leg Wound Open Wounding Event: Blister Status: Date Acquired: 08/01/2016 Comorbid Arrhythmia, Congestive Heart Failure, Weeks Of Treatment: 6 History: Hypertension, Type II  Diabetes, Clustered Wound: No Osteoarthritis Photos Wound Measurements Length: (cm) 0.5 Width: (cm) 0.4 Depth: (cm) 0.1 Area: (cm) 0.157 Volume: (cm) 0.016 % Reduction in Area: 99.7% % Reduction in Volume: 99.7% Epithelialization: Small (1-33%) Wound Description Classification: Grade 2 Foul Odor Aft Wound Margin: Distinct, outline attached Slough/Fibrin Exudate Amount: Medium Exudate Type: Serosanguineous Exudate Color: red, brown er Cleansing: No o No Wound Bed Granulation Amount: Large (67-100%) Exposed Structure Granulation Quality: Red Fascia Exposed: No Necrotic Amount: Small (1-33%) Fat Layer (Subcutaneous Tissue) Exposed: Yes Necrotic Quality: Adherent Slough Tendon Exposed: No Muscle Exposed: No Joint Exposed: No Bone Exposed: No Tamara Campbell, Tamara C. ([id number]) Periwound Skin Texture Texture Color No Abnormalities Noted: No No Abnormalities Noted: No Callus: No Atrophie Blanche: No Crepitus: No Cyanosis: No Excoriation: No Ecchymosis: Yes Induration: Yes Erythema: No Rash: No Hemosiderin Staining: Yes Scarring: No Mottled: No Pallor: No Moisture Rubor: No No Abnormalities Noted: No Dry / Scaly: No Temperature / Pain Maceration: No Temperature: No Abnormality Tenderness on Palpation: Yes Wound Preparation Ulcer Cleansing: Rinsed/Irrigated with Saline Topical Anesthetic Applied: Other: lidocaine 4%, Treatment Notes Wound #1 (Left, Anterior Lower Leg) 1. Cleansed with: Clean wound with Normal Saline Cleanse wound with antibacterial soap and water 2. Anesthetic Topical Lidocaine 4% cream to wound bed prior to debridement 4. Dressing Applied: Foam 5. Secondary Dressing Applied ABD Pad 7. Secured with 3 Layer Compression System - Left Lower Extremity Notes unna to Ecologist) Signed: 09/19/2016 5:45:26 PM By: Elliot Gurney, RN, BSN, Kim RN, BSN Entered By: Elliot Gurney, RN, BSN, Kim on 09/19/2016 15:32:25 Tamara Campbell  ([id number]) -------------------------------------------------------------------------------- Vitals Details Patient Name: Tamara Campbell Date of Service: 09/19/2016 3:00  PM Medical Record Number: [id number] Patient Account Number: [id number] Date of Birth/Sex: [birth year] (83 y.o. Female) Treating RN: Huel Coventry Primary Care Lizzet Hendley: Jodi Mourning Other Clinician: Referring Lorilee Cafarella: Jodi Mourning Treating Amayia Ciano/Extender: Rudene Re in Treatment: 6 Vital Signs Time Taken: 15:05 Temperature (F): 97.6 Height (in): 62 Pulse (bpm): 73 Weight (lbs): 130 Respiratory Rate (breaths/min): 16 Body Mass Index (BMI): 23.8 Blood Pressure (mmHg): 158/82 Reference Range: 80 - 120 mg / dl Electronic Signature(s) Signed: 09/19/2016 5:45:26 PM By: Elliot Gurney, RN, BSN, Kim RN, BSN Entered By: Elliot Gurney, RN, BSN, Kim on 09/19/2016 15:10:11

## 2016-09-26 ENCOUNTER — Encounter: Payer: Medicare HMO | Admitting: Surgery

## 2016-09-26 DIAGNOSIS — Z950 Presence of cardiac pacemaker: Secondary | ICD-10-CM | POA: Diagnosis not present

## 2016-09-26 DIAGNOSIS — L97211 Non-pressure chronic ulcer of right calf limited to breakdown of skin: Secondary | ICD-10-CM | POA: Diagnosis not present

## 2016-09-26 DIAGNOSIS — I509 Heart failure, unspecified: Secondary | ICD-10-CM | POA: Diagnosis not present

## 2016-09-26 DIAGNOSIS — I110 Hypertensive heart disease with heart failure: Secondary | ICD-10-CM | POA: Diagnosis not present

## 2016-09-26 DIAGNOSIS — L97221 Non-pressure chronic ulcer of left calf limited to breakdown of skin: Secondary | ICD-10-CM | POA: Diagnosis not present

## 2016-09-26 DIAGNOSIS — L97822 Non-pressure chronic ulcer of other part of left lower leg with fat layer exposed: Secondary | ICD-10-CM | POA: Diagnosis not present

## 2016-09-26 DIAGNOSIS — I872 Venous insufficiency (chronic) (peripheral): Secondary | ICD-10-CM | POA: Diagnosis not present

## 2016-09-26 DIAGNOSIS — I429 Cardiomyopathy, unspecified: Secondary | ICD-10-CM | POA: Diagnosis not present

## 2016-09-26 DIAGNOSIS — I4891 Unspecified atrial fibrillation: Secondary | ICD-10-CM | POA: Diagnosis not present

## 2016-09-26 DIAGNOSIS — I890 Lymphedema, not elsewhere classified: Secondary | ICD-10-CM | POA: Diagnosis not present

## 2016-09-26 DIAGNOSIS — E11622 Type 2 diabetes mellitus with other skin ulcer: Secondary | ICD-10-CM | POA: Diagnosis not present

## 2016-09-26 NOTE — Progress Notes (Signed)
SHADARA, LOPEZ ([id number]) Visit Report for 09/26/2016 Arrival Information Details Patient Name: Tamara Campbell. Date of Service: 09/26/2016 2:30 PM Medical Record Number: [id number] Patient Account Number: [id number] Date of Birth/Sex: 03-16-1931 (83 y.o. Female) Treating RN: Ashok Cordia, Debi Primary Care Janeli Lewison: Jodi Mourning Other Clinician: Referring Jeramey Lanuza: Jodi Mourning Treating Tayja Manzer/Extender: Rudene Re in Treatment: 7 Visit Information History Since Last Visit All ordered tests and consults were completed: No Patient Arrived: Chicas Added or deleted any medications: No Arrival Time: 14:31 Any new allergies or adverse reactions: No Accompanied By: daughter Had a fall or experienced change in No Transfer Assistance: EasyPivot Patient activities of daily living that may affect Lift risk of falls: Patient Identification Verified: Yes Signs or symptoms of abuse/neglect since last No Secondary Verification Process Yes visito Completed: Hospitalized since last visit: No Patient Requires No Has Dressing in Place as Prescribed: Yes Transmission-Based Precautions: Has Compression in Place as Prescribed: Yes Patient Has Alerts: Yes Pain Present Now: No Patient Alerts: Patient on Blood Thinner Eliquis DM II R ABI noncompressible Electronic Signature(s) Signed: 09/26/2016 4:14:56 PM By: Alejandro Mulling Entered By: Alejandro Mulling on 09/26/2016 14:33:59 Tamara Campbell ([id number]) -------------------------------------------------------------------------------- Clinic Level of Care Assessment Details Patient Name: Tamara Campbell Date of Service: 09/26/2016 2:30 PM Medical Record Number: [id number] Patient Account Number: [id number] Date of Birth/Sex: 16-Dec-1930 (83 y.o. Female) Treating RN: Ashok Cordia, Debi Primary Care Klaudia Beirne: Jodi Mourning Other Clinician: Referring Nusrat Encarnacion: Jodi Mourning Treating Cambrea Kirt/Extender: Rudene Re  in Treatment: 7 Clinic Level of Care Assessment Items TOOL 4 Quantity Score X - Use when only an EandM is performed on FOLLOW-UP visit 1 0 ASSESSMENTS - Nursing Assessment / Reassessment X - Reassessment of Co-morbidities (includes updates in patient status) 1 10 X - Reassessment of Adherence to Treatment Plan 1 5 ASSESSMENTS - Wound and Skin Assessment / Reassessment X - Simple Wound Assessment / Reassessment - one wound 1 5  - Complex Wound Assessment / Reassessment - multiple wounds 0  - Dermatologic / Skin Assessment (not related to wound area) 0 ASSESSMENTS - Focused Assessment  - Circumferential Edema Measurements - multi extremities 0  - Nutritional Assessment / Counseling / Intervention 0  - Lower Extremity Assessment (monofilament, tuning fork, pulses) 0  - Peripheral Arterial Disease Assessment (using hand held doppler) 0 ASSESSMENTS - Ostomy and/or Continence Assessment and Care  - Incontinence Assessment and Management 0  - Ostomy Care Assessment and Management (repouching, etc.) 0 PROCESS - Coordination of Care X - Simple Patient / Family Education for ongoing care 1 15  - Complex (extensive) Patient / Family Education for ongoing care 0  - Staff obtains Chiropractor, Records, Test Results / Process Orders 0  - Staff telephones HHA, Nursing Homes / Clarify orders / etc 0  - Routine Transfer to another Facility (non-emergent condition) 0 Nish, Shayli C. ([id number])  - Routine Hospital Admission (non-emergent condition) 0  - New Admissions / Manufacturing engineer / Ordering NPWT, Apligraf, etc. 0  - Emergency Hospital Admission (emergent condition) 0 X - Simple Discharge Coordination 1 10  - Complex (extensive) Discharge Coordination 0 PROCESS - Special Needs  - Pediatric / Minor Patient Management 0  - Isolation Patient Management 0  - Hearing / Language / Visual special needs 0  - Assessment of Community assistance  (transportation, D/C planning, etc.) 0  - Additional assistance / Altered mentation 0  - Support Surface(s) Assessment (bed, cushion, seat, etc.) 0 INTERVENTIONS - Wound Cleansing / Measurement  X - Simple Wound Cleansing - one wound 1 5  - Complex Wound Cleansing - multiple wounds 0 X - Wound Imaging (photographs - any number of wounds) 1 5  - Wound Tracing (instead of photographs) 0 X - Simple Wound Measurement - one wound 1 5  - Complex Wound Measurement - multiple wounds 0 INTERVENTIONS - Wound Dressings  - Small Wound Dressing one or multiple wounds 0 X - Medium Wound Dressing one or multiple wounds 1 15  - Large Wound Dressing one or multiple wounds 0  - Application of Medications - topical 0  - Application of Medications - injection 0 INTERVENTIONS - Miscellaneous  - External ear exam 0 Shook, Emmelyn C. ([id number])  - Specimen Collection (cultures, biopsies, blood, body fluids, etc.) 0  - Specimen(s) / Culture(s) sent or taken to Lab for analysis 0  - Patient Transfer (multiple staff / Michiel Sites Lift / Similar devices) 0  - Simple Staple / Suture removal (25 or less) 0  - Complex Staple / Suture removal (26 or more) 0  - Hypo / Hyperglycemic Management (close monitor of Blood Glucose) 0  - Ankle / Brachial Index (ABI) - do not check if billed separately 0 X - Vital Signs 1 5 Has the patient been seen at the hospital within the last three years: Yes Total Score: 80 Level Of Care: New/Established - Level 3 Electronic Signature(s) Signed: 09/26/2016 4:14:56 PM By: Alejandro Mulling Entered By: Alejandro Mulling on 09/26/2016 15:08:51 Tamara Campbell ([id number]) -------------------------------------------------------------------------------- Encounter Discharge Information Details Patient Name: Tamara Campbell. Date of Service: 09/26/2016 2:30 PM Medical Record Number: [id number] Patient Account Number: [id number] Date of Birth/Sex: December 19, 1930 (83 y.o.  Female) Treating RN: Ashok Cordia, Debi Primary Care Corvin Sorbo: Jodi Mourning Other Clinician: Referring Houa Ackert: Jodi Mourning Treating Vickye Astorino/Extender: Rudene Re in Treatment: 7 Encounter Discharge Information Items Discharge Pain Level: 0 Discharge Condition: Stable Ambulatory Status: [place] Discharge Destination: Home Private Transportation: Auto Accompanied By: daughter Schedule Follow-up Appointment: Yes Medication Reconciliation completed and No provided to Patient/Care Jolaine Fryberger: Clinical Summary of Care: Electronic Signature(s) Signed: 09/26/2016 4:14:56 PM By: Alejandro Mulling Entered By: Alejandro Mulling on 09/26/2016 15:07:19 Tamara Campbell ([id number]) -------------------------------------------------------------------------------- Lower Extremity Assessment Details Patient Name: Tamara Campbell. Date of Service: 09/26/2016 2:30 PM Medical Record Number: [id number] Patient Account Number: [id number] Date of Birth/Sex: 03-30-1931 (83 y.o. Female) Treating RN: Ashok Cordia, Debi Primary Care Lela Gell: Jodi Mourning Other Clinician: Referring Eldo Umanzor: Jodi Mourning Treating Kenna Kirn/Extender: Rudene Re in Treatment: 7 Vascular Assessment Pulses: Dorsalis Pedis Palpable: [Left:Yes] Posterior Tibial Extremity colors, hair growth, and conditions: Extremity Color: [Left:Hyperpigmented] Temperature of Extremity: [Left:Warm] Capillary Refill: [Left:< 3 seconds] Toe Nail Assessment Left: Right: Thick: No Discolored: No Deformed: No Improper Length and Hygiene: No Electronic Signature(s) Signed: 09/26/2016 4:14:56 PM By: Alejandro Mulling Entered By: Alejandro Mulling on 09/26/2016 14:49:34 Wojnar, Arlis Porta ([id number]) -------------------------------------------------------------------------------- Multi Wound Chart Details Patient Name: Tamara Campbell. Date of Service: 09/26/2016 2:30 PM Medical Record Number: [id number] Patient  Account Number: [id number] Date of Birth/Sex: 1930-08-30 (83 y.o. Female) Treating RN: Ashok Cordia, Debi Primary Care Tashaya Ancrum: Jodi Mourning Other Clinician: Referring Mikiya Nebergall: Jodi Mourning Treating Marisue Canion/Extender: Rudene Re in Treatment: 7 Vital Signs Height(in): 62 Pulse(bpm): 70 Weight(lbs): 130 Blood Pressure 141/79 (mmHg): Body Mass Index(BMI): 24 Temperature(F): Respiratory Rate 16 (breaths/min): Photos: [1:No Photos] [N/A:N/A] Wound Location: [1:Left Lower Leg - Anterior] [N/A:N/A] Wounding Event: [1:Blister] [N/A:N/A] Primary Etiology: [1:Diabetic Wound/Ulcer of the Lower Extremity] [N/A:N/A] Comorbid History: [1:Arrhythmia,  Congestive Heart Failure, Hypertension, Type II Diabetes, Osteoarthritis] [N/A:N/A] Date Acquired: [1:08/01/2016] [N/A:N/A] Weeks of Treatment: [1:7] [N/A:N/A] Wound Status: [1:Open] [N/A:N/A] Measurements L x W x D 0.1x0.1x0.1 [N/A:N/A] (cm) Area (cm) : [1:0.008] [N/A:N/A] Volume (cm) : [1:0.001] [N/A:N/A] % Reduction in Area: [1:100.00%] [N/A:N/A] % Reduction in Volume: 100.00% [N/A:N/A] Classification: [1:Grade 2] [N/A:N/A] Exudate Amount: [1:Small] [N/A:N/A] Exudate Type: [1:Serosanguineous] [N/A:N/A] Exudate Color: [1:red, brown] [N/A:N/A] Wound Margin: [1:Distinct, outline attached] [N/A:N/A] Granulation Amount: [1:Large (67-100%)] [N/A:N/A] Granulation Quality: [1:Red] [N/A:N/A] Necrotic Amount: [1:None Present (0%)] [N/A:N/A] Exposed Structures: [1:Fat Layer (Subcutaneous Tissue) Exposed: Yes Fascia: No Tendon: No] [N/A:N/A] Muscle: No Joint: No Bone: No Epithelialization: Small (1-33%) N/A N/A Periwound Skin Texture: Induration: Yes N/A N/A Excoriation: No Callus: No Crepitus: No Rash: No Scarring: No Periwound Skin Maceration: No N/A N/A Moisture: Dry/Scaly: No Periwound Skin Color: Ecchymosis: Yes N/A N/A Hemosiderin Staining: Yes Atrophie Blanche: No Cyanosis: No Erythema: No Mottled:  No Pallor: No Rubor: No Temperature: No Abnormality N/A N/A Tenderness on Yes N/A N/A Palpation: Wound Preparation: Ulcer Cleansing: N/A N/A Rinsed/Irrigated with Saline Topical Anesthetic Applied: Other: lidocaine 4% Treatment Notes Electronic Signature(s) Signed: 09/26/2016 2:54:03 PM By: Evlyn Kanner MD, FACS Entered By: Evlyn Kanner on 09/26/2016 14:54:02 Tamara Campbell ([id number]) -------------------------------------------------------------------------------- Multi-Disciplinary Care Plan Details Patient Name: Tamara Campbell Date of Service: 09/26/2016 2:30 PM Medical Record Number: [id number] Patient Account Number: [id number] Date of Birth/Sex: [birth year] (83 y.o. Female) Treating RN: Ashok Cordia, Debi Primary Care Edoardo Laforte: Jodi Mourning Other Clinician: Referring Pierra Skora: Jodi Mourning Treating Crayton Savarese/Extender: Rudene Re in Treatment: 7 Active Inactive ` Abuse / Safety / Falls / Self Care Management Nursing Diagnoses: Potential for falls Goals: Patient will remain injury free Date Initiated: 08/08/2016 Target Resolution Date: 11/12/2016 Goal Status: Active Interventions: Assess fall risk on admission and as needed Assess impairment of mobility on admission and as needed per policy Notes: ` Nutrition Nursing Diagnoses: Imbalanced nutrition Impaired glucose control: actual or potential Goals: Patient/caregiver agrees to and verbalizes understanding of need to use nutritional supplements and/or vitamins as prescribed Date Initiated: 08/08/2016 Target Resolution Date: 12/10/2016 Goal Status: Active Patient/caregiver verbalizes understanding of need to maintain therapeutic glucose control per primary care physician Date Initiated: 08/08/2016 Target Resolution Date: 12/10/2016 Goal Status: Active Interventions: Assess HgA1c results as ordered upon admission and as needed Assess patient nutrition upon admission and as needed per policy DAILY, DOE ([id number]) Notes: ` Orientation to the Wound Care Program Nursing Diagnoses: Knowledge deficit related to the wound healing center program Goals: Patient/caregiver will verbalize understanding of the Wound Healing Center Program Date Initiated: 08/08/2016 Target Resolution Date: 09/10/2016 Goal Status: Active Interventions: Provide education on orientation to the wound center Notes: ` Pain, Acute or Chronic Nursing Diagnoses: Pain, acute or chronic: actual or potential Potential alteration in comfort, pain Goals: Patient/caregiver will verbalize adequate pain control between visits Date Initiated: 08/08/2016 Target Resolution Date: 12/10/2016 Goal Status: Active Interventions: Complete pain assessment as per visit requirements Notes: ` Wound/Skin Impairment Nursing Diagnoses: Impaired tissue integrity Knowledge deficit related to ulceration/compromised skin integrity Goals: Ulcer/skin breakdown will have a volume reduction of 80% by week 12 Date Initiated: 08/08/2016 Target Resolution Date: 12/10/2016 Goal Status: Active VERLINE, KONG ([id number]) Interventions: Assess patient/caregiver ability to perform ulcer/skin care regimen upon admission and as needed Assess ulceration(s) every visit Notes: Electronic Signature(s) Signed: 09/26/2016 4:14:56 PM By: Alejandro Mulling Entered By: Alejandro Mulling on 09/26/2016 14:49:06 Tamara Campbell ([id number]) -------------------------------------------------------------------------------- Pain Assessment Details Patient Name: Rebecca Eaton  C. Date of Service: 09/26/2016 2:30 PM Medical Record Number: [id number] Patient Account Number: [id number] Date of Birth/Sex: [birth year] (83 y.o. Female) Treating RN: Ashok Cordia, Debi Primary Care Hughes Wyndham: Jodi Mourning Other Clinician: Referring Nam Vossler: Jodi Mourning Treating Kimberely Mccannon/Extender: Rudene Re in Treatment: 7 Active Problems Location of Pain Severity and  Description of Pain Patient Has Paino No Site Locations With Dressing Change: No Pain Management and Medication Current Pain Management: Electronic Signature(s) Signed: 09/26/2016 4:14:56 PM By: Alejandro Mulling Entered By: Alejandro Mulling on 09/26/2016 14:34:05 Tamara Campbell ([id number]) -------------------------------------------------------------------------------- Patient/Caregiver Education Details Patient Name: Tamara Campbell Date of Service: 09/26/2016 2:30 PM Medical Record Number: [id number] Patient Account Number: [id number] Date of Birth/Gender: 1930/12/15 (83 y.o. Female) Treating RN: Phillis Haggis Primary Care Physician: Jodi Mourning Other Clinician: Referring Physician: Jodi Mourning Treating Physician/Extender: Rudene Re in Treatment: 7 Education Assessment Education Provided To: Patient Education Topics Provided Wound/Skin Impairment: Handouts: Other: change dressing as ordered Methods: Demonstration, Explain/Verbal Responses: State content correctly Electronic Signature(s) Signed: 09/26/2016 4:14:56 PM By: Alejandro Mulling Entered By: Alejandro Mulling on 09/26/2016 15:07:33 Tamara Campbell ([id number]) -------------------------------------------------------------------------------- Wound Assessment Details Patient Name: Tamara Campbell. Date of Service: 09/26/2016 2:30 PM Medical Record Number: [id number] Patient Account Number: [id number] Date of Birth/Sex: January 07, 1931 (83 y.o. Female) Treating RN: Ashok Cordia, Debi Primary Care Leea Rambeau: Jodi Mourning Other Clinician: Referring Atiba Kimberlin: Jodi Mourning Treating Simren Popson/Extender: Rudene Re in Treatment: 7 Wound Status Wound Number: 1 Primary Diabetic Wound/Ulcer of the Lower Etiology: Extremity Wound Location: Left Lower Leg - Anterior Wound Open Wounding Event: Blister Status: Date Acquired: 08/01/2016 Comorbid Arrhythmia, Congestive Heart Failure, Weeks Of  Treatment: 7 History: Hypertension, Type II Diabetes, Clustered Wound: No Osteoarthritis Photos Photo Uploaded By: Alejandro Mulling on 09/26/2016 16:02:04 Wound Measurements Length: (cm) 0.1 Width: (cm) 0.1 Depth: (cm) 0.1 Area: (cm) 0.008 Volume: (cm) 0.001 % Reduction in Area: 100% % Reduction in Volume: 100% Epithelialization: Small (1-33%) Tunneling: No Undermining: No Wound Description Classification: Grade 2 Foul Odor Aft Wound Margin: Distinct, outline attached Slough/Fibrin Exudate Amount: Small Exudate Type: Serosanguineous Exudate Color: red, brown er Cleansing: No o No Wound Bed Granulation Amount: Large (67-100%) Exposed Structure Granulation Quality: Red Fascia Exposed: No Necrotic Amount: None Present (0%) Fat Layer (Subcutaneous Tissue) Exposed: Yes Tendon Exposed: No Privett, Karena C. ([id number]) Muscle Exposed: No Joint Exposed: No Bone Exposed: No Periwound Skin Texture Texture Color No Abnormalities Noted: No No Abnormalities Noted: No Callus: No Atrophie Blanche: No Crepitus: No Cyanosis: No Excoriation: No Ecchymosis: Yes Induration: Yes Erythema: No Rash: No Hemosiderin Staining: Yes Scarring: No Mottled: No Pallor: No Moisture Rubor: No No Abnormalities Noted: No Dry / Scaly: No Temperature / Pain Maceration: No Temperature: No Abnormality Tenderness on Palpation: Yes Wound Preparation Ulcer Cleansing: Rinsed/Irrigated with Saline Topical Anesthetic Applied: Other: lidocaine 4%, Treatment Notes Wound #1 (Left, Anterior Lower Leg) 1. Cleansed with: Clean wound with Normal Saline Cleanse wound with antibacterial soap and water 4. Dressing Applied: Foam 5. Secondary Dressing Applied Kerlix/Conform 7. Secured with Tape Notes pt to wear own bilateral juxtalites. Electronic Signature(s) Signed: 09/26/2016 4:14:56 PM By: Alejandro Mulling Entered By: Alejandro Mulling on 09/26/2016 14:52:14 Tamara Campbell  ([id number]) -------------------------------------------------------------------------------- Vitals Details Patient Name: Tamara Campbell Date of Service: 09/26/2016 2:30 PM Medical Record Number: [id number] Patient Account Number: [id number] Date of Birth/Sex: 1930/12/12 (83 y.o. Female) Treating RN: Ashok Cordia, Debi Primary Care Vianna Venezia: Jodi Mourning Other Clinician: Referring Kalynn Declercq: Jodi Mourning Treating Hisham Provence/Extender: Meyer Russel,  Errol Weeks in Treatment: 7 Vital Signs Time Taken: 14:35 Pulse (bpm): 70 Height (in): 62 Respiratory Rate (breaths/min): 16 Weight (lbs): 130 Blood Pressure (mmHg): 141/79 Body Mass Index (BMI): 23.8 Reference Range: 80 - 120 mg / dl Electronic Signature(s) Signed: 09/26/2016 4:14:56 PM By: Alejandro Mulling Entered By: Alejandro Mulling on 09/26/2016 14:35:56

## 2016-09-26 NOTE — Progress Notes (Addendum)
MASA, LUBIN ([id number]) Visit Report for 09/26/2016 Chief Complaint Document Details Patient Name: Tamara Campbell, Tamara Campbell. Date of Service: 09/26/2016 2:30 PM Medical Record Number: [id number] Patient Account Number: [id number] Date of Birth/Sex: Sep 10, 1930 (83 y.o. Female) Treating RN: Ashok Cordia, Debi Primary Care Provider: Jodi Mourning Other Clinician: Referring Provider: Jodi Mourning Treating Provider/Extender: Rudene Re in Treatment: 7 Information Obtained from: Patient Chief Complaint Patient presents to the wound care center for a consult due non healing wound both lower extremities with swelling for the last 2 weeks Electronic Signature(s) Signed: 09/26/2016 2:54:11 PM By: Evlyn Kanner MD, FACS Entered By: Evlyn Kanner on 09/26/2016 14:54:10 Tamara Campbell ([id number]) -------------------------------------------------------------------------------- HPI Details Patient Name: Tamara Campbell. Date of Service: 09/26/2016 2:30 PM Medical Record Number: [id number] Patient Account Number: [id number] Date of Birth/Sex: 03-Nov-1930 (83 y.o. Female) Treating RN: Ashok Cordia, Debi Primary Care Provider: Jodi Mourning Other Clinician: Referring Provider: Jodi Mourning Treating Provider/Extender: Rudene Re in Treatment: 7 History of Present Illness Location: both lower extremities Quality: Patient reports No Pain. Severity: Patient states wound (s) are getting better. Duration: Patient has had the wound for < 2 weeks prior to presenting for treatment Context: The wound appeared gradually over time Modifying Factors: Other treatment(s) tried include:Silvadene cream and compression wraps Associated Signs and Symptoms: Patient reports having increase swelling. HPI Description: 83 year old patient who has been sent to see [place] by her PCP Dr. Yates Decamp, for open wounds on her left lower extremity, limited to breakdown of skin, possibly due to venous stasis ulcer,  has a past medical history of diabetes mellitus type 2, status post cardiac pacemaker, essential hypertension, atrial fibrillation, CHF, cardiomyopathy, ventricular tachycardia, status post right total knee arthroplasty in 2014, back surgery in 1996, hysterectomy. She has never been a smoker. her PCP found that she had stasis edema of both lower extremities left worse than right and had ulceration on the lateral aspect of her left leg. Last hemoglobin A1c done and February of this year was 6.3% patient's daughter tells me that she has seen at [facility] for an opinion about a year ago and hence we will try and obtain these reports regarding the workup done and the recommendations made. 08/22/2016 -- -- the patient was seen by Dr. Gilda Crease on 06/22/2015 and after examination he made a note of her atherosclerosis of the lower extremities with ulceration and recommended noninvasive studies. The patient was seen back in follow-up on 07/06/2015 and her right ABI was 1.13 and the left was 1.20 with triphasic pedal signals bilaterally. Duplex ultrasound of the left lower extremity showed SFA, popliteal and tibial arteries with diffuse disease but no focal stenosis and triphasic. His impression was since these arterial studies were normal he thought the problem for the ulceration was more venous then a peripheral arterial disease. Commended to continue with graduated compression and follow back in 3 months. Electronic Signature(s) Signed: 09/26/2016 2:54:16 PM By: Evlyn Kanner MD, FACS Entered By: Evlyn Kanner on 09/26/2016 14:54:16 Tamara Campbell ([id number]) -------------------------------------------------------------------------------- Physical Exam Details Patient Name: Tamara Campbell Date of Service: 09/26/2016 2:30 PM Medical Record Number: [id number] Patient Account Number: [id number] Date of Birth/Sex: September 03, 1930 (83 y.o. Female) Treating RN: Ashok Cordia, Debi Primary Care  Provider: Jodi Mourning Other Clinician: Referring Provider: Jodi Mourning Treating Provider/Extender: Rudene Re in Treatment: 7 Constitutional . Pulse regular. Respirations normal and unlabored. Afebrile. . Eyes Nonicteric. Reactive to light. Ears, Nose, Mouth, and Throat Lips, teeth, and  gums WNL.Marland Kitchen Moist mucosa without lesions. Neck supple and nontender. No palpable supraclavicular or cervical adenopathy. Normal sized without goiter. Respiratory WNL. No retractions.. Breath sounds WNL, No rubs, rales, rhonchi, or wheeze.. Cardiovascular Heart rhythm and rate regular, no murmur or gallop.. Pedal Pulses WNL. No clubbing, cyanosis or edema. Chest Breasts symmetical and no nipple discharge.. Breast tissue WNL, no masses, lumps, or tenderness.. Lymphatic No adneopathy. No adenopathy. No adenopathy. Musculoskeletal Adexa without tenderness or enlargement.. Digits and nails w/o clubbing, cyanosis, infection, petechiae, ischemia, or inflammatory conditions.. Integumentary (Hair, Skin) No suspicious lesions. No crepitus or fluctuance. No peri-wound warmth or erythema. No masses.Marland Kitchen Psychiatric Judgement and insight Intact.. No evidence of depression, anxiety, or agitation.. Notes the lymphedema has gone down very significantly and the wound looks excellent and has a thin layer of epithelium. No drainage from the wound. Electronic Signature(s) Signed: 09/26/2016 2:54:43 PM By: Evlyn Kanner MD, FACS Entered By: Evlyn Kanner on 09/26/2016 14:54:42 Tamara Campbell ([id number]) -------------------------------------------------------------------------------- Physician Orders Details Patient Name: Tamara Campbell Date of Service: 09/26/2016 2:30 PM Medical Record Number: [id number] Patient Account Number: [id number] Date of Birth/Sex: [birth year] (83 y.o. Female) Treating RN: Ashok Cordia, Debi Primary Care Provider: Jodi Mourning Other Clinician: Referring Provider: Jodi Mourning Treating Provider/Extender: Rudene Re in Treatment: 7 Verbal / Phone Orders: Yes Clinician: Ashok Cordia, Debi Read Back and Verified: Yes Diagnosis Coding Wound Cleansing Wound #1 Left,Anterior Lower Leg o Clean wound with Normal Saline. o Cleanse wound with mild soap and water Anesthetic Wound #1 Left,Anterior Lower Leg o Topical Lidocaine 4% cream applied to wound bed prior to debridement Primary Wound Dressing Wound #1 Left,Anterior Lower Leg o Foam Secondary Dressing Wound #1 Left,Anterior Lower Leg o Conform/Kerlix Dressing Change Frequency Wound #1 Left,Anterior Lower Leg o Change dressing every week Follow-up Appointments Wound #1 Left,Anterior Lower Leg o Return Appointment in 1 week. Edema Control Wound #1 Left,Anterior Lower Leg o Elevate legs to the level of the heart and pump ankles as often as possible o Other: - pt to wear bilateral juxtalites. Additional Orders / Instructions Wound #1 Left,Anterior Lower Leg o Increase protein intake. SRAH, AKE ([id number]) Electronic Signature(s) Signed: 09/26/2016 3:55:33 PM By: Evlyn Kanner MD, FACS Signed: 09/26/2016 4:14:56 PM By: Alejandro Mulling Entered By: Alejandro Mulling on 09/26/2016 14:54:29 Tamara Campbell ([id number]) -------------------------------------------------------------------------------- Problem List Details Patient Name: Tamara Campbell, Tamara Campbell. Date of Service: 09/26/2016 2:30 PM Medical Record Number: [id number] Patient Account Number: [id number] Date of Birth/Sex: 12-15-1930 (83 y.o. Female) Treating RN: Ashok Cordia, Debi Primary Care Provider: Jodi Mourning Other Clinician: Referring Provider: Jodi Mourning Treating Provider/Extender: Rudene Re in Treatment: 7 Active Problems ICD-10 Encounter Code Description Active Date Diagnosis E11.622 Type 2 diabetes mellitus with other skin ulcer 08/08/2016 Yes I89.0 Lymphedema, not elsewhere classified  08/08/2016 Yes L97.221 Non-pressure chronic ulcer of left calf limited to 08/08/2016 Yes breakdown of skin L97.211 Non-pressure chronic ulcer of right calf limited to 08/08/2016 Yes breakdown of skin Inactive Problems Resolved Problems Electronic Signature(s) Signed: 09/26/2016 2:53:58 PM By: Evlyn Kanner MD, FACS Entered By: Evlyn Kanner on 09/26/2016 14:53:58 Tamara Campbell ([id number]) -------------------------------------------------------------------------------- Progress Note Details Patient Name: Tamara Campbell. Date of Service: 09/26/2016 2:30 PM Medical Record Number: [id number] Patient Account Number: [id number] Date of Birth/Sex: 02/17/1931 (83 y.o. Female) Treating RN: Ashok Cordia, Debi Primary Care Provider: Jodi Mourning Other Clinician: Referring Provider: Jodi Mourning Treating Provider/Extender: Rudene Re in Treatment: 7 Subjective Chief Complaint Information obtained from Patient Patient presents  to the wound care center for a consult due non healing wound both lower extremities with swelling for the last 2 weeks History of Present Illness (HPI) The following HPI elements were documented for the patient's wound: Location: both lower extremities Quality: Patient reports No Pain. Severity: Patient states wound (s) are getting better. Duration: Patient has had the wound for < 2 weeks prior to presenting for treatment Context: The wound appeared gradually over time Modifying Factors: Other treatment(s) tried include:Silvadene cream and compression wraps Associated Signs and Symptoms: Patient reports having increase swelling. 83 year old patient who has been sent to see [place] by her PCP Dr. Yates Decamp, for open wounds on her left lower extremity, limited to breakdown of skin, possibly due to venous stasis ulcer, has a past medical history of diabetes mellitus type 2, status post cardiac pacemaker, essential hypertension, atrial fibrillation, CHF,  cardiomyopathy, ventricular tachycardia, status post right total knee arthroplasty in 2014, back surgery in 1996, hysterectomy. She has never been a smoker. her PCP found that she had stasis edema of both lower extremities left worse than right and had ulceration on the lateral aspect of her left leg. Last hemoglobin A1c done and February of this year was 6.3% patient's daughter tells me that she has seen at [facility] for an opinion about a year ago and hence we will try and obtain these reports regarding the workup done and the recommendations made. 08/22/2016 -- -- the patient was seen by Dr. Gilda Crease on 06/22/2015 and after examination he made a note of her atherosclerosis of the lower extremities with ulceration and recommended noninvasive studies. The patient was seen back in follow-up on 07/06/2015 and her right ABI was 1.13 and the left was 1.20 with triphasic pedal signals bilaterally. Duplex ultrasound of the left lower extremity showed SFA, popliteal and tibial arteries with diffuse disease but no focal stenosis and triphasic. His impression was since these arterial studies were normal he thought the problem for the ulceration was more venous then a peripheral arterial disease. Commended to continue with graduated compression and follow back in 3 months. Tamara Campbell, Tamara Campbell ([id number]) Objective Constitutional Pulse regular. Respirations normal and unlabored. Afebrile. Vitals Time Taken: 2:35 PM, Height: 62 in, Weight: 130 lbs, BMI: 23.8, Pulse: 70 bpm, Respiratory Rate: 16 breaths/min, Blood Pressure: 141/79 mmHg. Eyes Nonicteric. Reactive to light. Ears, Nose, Mouth, and Throat Lips, teeth, and gums WNL.Marland Kitchen Moist mucosa without lesions. Neck supple and nontender. No palpable supraclavicular or cervical adenopathy. Normal sized without goiter. Respiratory WNL. No retractions.. Breath sounds WNL, No rubs, rales, rhonchi, or wheeze.. Cardiovascular Heart rhythm and  rate regular, no murmur or gallop.. Pedal Pulses WNL. No clubbing, cyanosis or edema. Chest Breasts symmetical and no nipple discharge.. Breast tissue WNL, no masses, lumps, or tenderness.. Lymphatic No adneopathy. No adenopathy. No adenopathy. Musculoskeletal Adexa without tenderness or enlargement.. Digits and nails w/o clubbing, cyanosis, infection, petechiae, ischemia, or inflammatory conditions.Marland Kitchen Psychiatric Judgement and insight Intact.. No evidence of depression, anxiety, or agitation.. General Notes: the lymphedema has gone down very significantly and the wound looks excellent and has a thin layer of epithelium. No drainage from the wound. Integumentary (Hair, Skin) No suspicious lesions. No crepitus or fluctuance. No peri-wound warmth or erythema. No masses.. Wound #1 status is Open. Original cause of wound was Blister. The wound is located on the Left,Anterior Lower Leg. The wound measures 0.1cm length x 0.1cm width x 0.1cm depth; 0.008cm^2 area and 0.001cm^3 volume. There is Fat Layer (Subcutaneous Tissue)  Exposed exposed. There is no tunneling or Bieda, Tamara C. ([id number]) undermining noted. There is a small amount of serosanguineous drainage noted. The wound margin is distinct with the outline attached to the wound base. There is large (67-100%) red granulation within the wound bed. There is no necrotic tissue within the wound bed. The periwound skin appearance exhibited: Induration, Ecchymosis, Hemosiderin Staining. The periwound skin appearance did not exhibit: Callus, Crepitus, Excoriation, Rash, Scarring, Dry/Scaly, Maceration, Atrophie Blanche, Cyanosis, Mottled, Pallor, Rubor, Erythema. Periwound temperature was noted as No Abnormality. The periwound has tenderness on palpation. Assessment Active Problems ICD-10 E11.622 - Type 2 diabetes mellitus with other skin ulcer I89.0 - Lymphedema, not elsewhere classified L97.221 - Non-pressure chronic ulcer of left calf  limited to breakdown of skin L97.211 - Non-pressure chronic ulcer of right calf limited to breakdown of skin Plan Wound Cleansing: Wound #1 Left,Anterior Lower Leg: Clean wound with Normal Saline. Cleanse wound with mild soap and water Anesthetic: Wound #1 Left,Anterior Lower Leg: Topical Lidocaine 4% cream applied to wound bed prior to debridement Primary Wound Dressing: Wound #1 Left,Anterior Lower Leg: Foam Secondary Dressing: Wound #1 Left,Anterior Lower Leg: Conform/Kerlix Dressing Change Frequency: Wound #1 Left,Anterior Lower Leg: Change dressing every week Follow-up Appointments: Wound #1 Left,Anterior Lower Leg: Return Appointment in 1 week. Edema Control: Wound #1 Left,Anterior Lower Leg: Elevate legs to the level of the heart and pump ankles as often as possible Tamara Campbell, Tamara C. ([id number]) Other: - pt to wear bilateral juxtalites. Additional Orders / Instructions: Wound #1 Left,Anterior Lower Leg: Increase protein intake. After review I have recommended: 1. Foam with a Juxtalite to be applied to the left lower extremitie. 2. juxta light compressions to be used on the right lower extremity. 3. elevation and exercise discussed with her in detail. 4. Regular visits the wound center -- I anticipate discharge soon Electronic Signature(s) Signed: 09/26/2016 2:56:09 PM By: Evlyn Kanner MD, FACS Entered By: Evlyn Kanner on 09/26/2016 14:56:08 Tamara Campbell ([id number]) -------------------------------------------------------------------------------- SuperBill Details Patient Name: Tamara Campbell Date of Service: 09/26/2016 Medical Record Number: [id number] Patient Account Number: [id number] Date of Birth/Sex: Nov 01, 1930 (83 y.o. Female) Treating RN: Ashok Cordia, Debi Primary Care Provider: Jodi Mourning Other Clinician: Referring Provider: Jodi Mourning Treating Provider/Extender: Rudene Re in Treatment: 7 Diagnosis Coding ICD-10 Codes Code  Description [id number] Type 2 diabetes mellitus with other skin ulcer I89.0 Lymphedema, not elsewhere classified L97.221 Non-pressure chronic ulcer of left calf limited to breakdown of skin L97.211 Non-pressure chronic ulcer of right calf limited to breakdown of skin Facility Procedures CPT4 Code: [id number] Description: 99213 - WOUND CARE VISIT-LEV 3 EST PT Modifier: Quantity: 1 Physician Procedures CPT4 Code Description: [id number] 99213 - WC PHYS LEVEL 3 - EST PT ICD-10 Description Diagnosis I89.0 Lymphedema, not elsewhere classified E11.622 Type 2 diabetes mellitus with other skin ulcer L97.221 Non-pressure chronic ulcer of left calf limited to  L97.211 Non-pressure chronic ulcer of right calf limited to Modifier: breakdown of ski breakdown of sk Quantity: 1 n in Electronic Signature(s) Signed: 09/26/2016 3:55:33 PM By: Evlyn Kanner MD, FACS Signed: 09/26/2016 4:14:56 PM By: Alejandro Mulling Previous Signature: 09/26/2016 2:56:29 PM Version By: Evlyn Kanner MD, FACS Entered By: Alejandro Mulling on 09/26/2016 15:08:59

## 2016-10-03 ENCOUNTER — Encounter: Payer: Medicare HMO | Admitting: Surgery

## 2016-10-03 DIAGNOSIS — Z950 Presence of cardiac pacemaker: Secondary | ICD-10-CM | POA: Diagnosis not present

## 2016-10-03 DIAGNOSIS — L97221 Non-pressure chronic ulcer of left calf limited to breakdown of skin: Secondary | ICD-10-CM | POA: Diagnosis not present

## 2016-10-03 DIAGNOSIS — I509 Heart failure, unspecified: Secondary | ICD-10-CM | POA: Diagnosis not present

## 2016-10-03 DIAGNOSIS — I110 Hypertensive heart disease with heart failure: Secondary | ICD-10-CM | POA: Diagnosis not present

## 2016-10-03 DIAGNOSIS — I890 Lymphedema, not elsewhere classified: Secondary | ICD-10-CM | POA: Diagnosis not present

## 2016-10-03 DIAGNOSIS — L97211 Non-pressure chronic ulcer of right calf limited to breakdown of skin: Secondary | ICD-10-CM | POA: Diagnosis not present

## 2016-10-03 DIAGNOSIS — L97829 Non-pressure chronic ulcer of other part of left lower leg with unspecified severity: Secondary | ICD-10-CM | POA: Diagnosis not present

## 2016-10-03 DIAGNOSIS — I429 Cardiomyopathy, unspecified: Secondary | ICD-10-CM | POA: Diagnosis not present

## 2016-10-03 DIAGNOSIS — I4891 Unspecified atrial fibrillation: Secondary | ICD-10-CM | POA: Diagnosis not present

## 2016-10-03 DIAGNOSIS — E11622 Type 2 diabetes mellitus with other skin ulcer: Secondary | ICD-10-CM | POA: Diagnosis not present

## 2016-10-04 NOTE — Progress Notes (Signed)
Tamara Campbell, Tamara Campbell ([id number]) Visit Report for 10/03/2016 Arrival Information Details Patient Name: Tamara Campbell, Tamara Campbell. Date of Service: 10/03/2016 2:30 PM Medical Record Number: [id number] Patient Account Number: [id number] Date of Birth/Sex: 1931-02-13 (83 y.o. Female) Treating RN: Ashok Cordia, Debi Primary Care Corian Handley: Jodi Mourning Other Clinician: Referring Cyree Chuong: Jodi Mourning Treating Teona Vargus/Extender: Rudene Re in Treatment: 8 Visit Information History Since Last Visit All ordered tests and consults were completed: No Patient Arrived: [place] Added or deleted any medications: No Arrival Time: 14:35 Any new allergies or adverse reactions: No Accompanied By: daughter Had a fall or experienced change in No Transfer Assistance: EasyPivot Patient activities of daily living that may affect Lift risk of falls: Patient Identification Verified: Yes Signs or symptoms of abuse/neglect since last No Secondary Verification Process Yes visito Completed: Hospitalized since last visit: No Patient Requires No Has Dressing in Place as Prescribed: Yes Transmission-Based Precautions: Has Compression in Place as Prescribed: Yes Patient Has Alerts: Yes Pain Present Now: No Patient Alerts: Patient on Blood Thinner Eliquis DM II R ABI noncompressible Electronic Signature(s) Signed: 10/03/2016 4:47:36 PM By: Alejandro Mulling Entered By: Alejandro Mulling on 10/03/2016 14:37:21 Harned, Tamara Campbell ([id number]) -------------------------------------------------------------------------------- Clinic Level of Care Assessment Details Patient Name: Tamara Campbell Date of Service: 10/03/2016 2:30 PM Medical Record Number: [id number] Patient Account Number: [id number] Date of Birth/Sex: 10/21/1930 (83 y.o. Female) Treating RN: Ashok Cordia, Debi Primary Care Rabab Currington: Jodi Mourning Other Clinician: Referring Jayni Prescher: Jodi Mourning Treating Bralyn Espino/Extender: Rudene Re  in Treatment: 8 Clinic Level of Care Assessment Items TOOL 4 Quantity Score X - Use when only an EandM is performed on FOLLOW-UP visit 1 0 ASSESSMENTS - Nursing Assessment / Reassessment X - Reassessment of Co-morbidities (includes updates in patient status) 1 10 X - Reassessment of Adherence to Treatment Plan 1 5 ASSESSMENTS - Wound and Skin Assessment / Reassessment X - Simple Wound Assessment / Reassessment - one wound 1 5  - Complex Wound Assessment / Reassessment - multiple wounds 0  - Dermatologic / Skin Assessment (not related to wound area) 0 ASSESSMENTS - Focused Assessment  - Circumferential Edema Measurements - multi extremities 0  - Nutritional Assessment / Counseling / Intervention 0  - Lower Extremity Assessment (monofilament, tuning fork, pulses) 0  - Peripheral Arterial Disease Assessment (using hand held doppler) 0 ASSESSMENTS - Ostomy and/or Continence Assessment and Care  - Incontinence Assessment and Management 0  - Ostomy Care Assessment and Management (repouching, etc.) 0 PROCESS - Coordination of Care X - Simple Patient / Family Education for ongoing care 1 15  - Complex (extensive) Patient / Family Education for ongoing care 0  - Staff obtains Chiropractor, Records, Test Results / Process Orders 0  - Staff telephones HHA, Nursing Homes / Clarify orders / etc 0  - Routine Transfer to another Facility (non-emergent condition) 0 Tamara Campbell, Tamara C. ([id number])  - Routine Hospital Admission (non-emergent condition) 0  - New Admissions / Manufacturing engineer / Ordering NPWT, Apligraf, etc. 0  - Emergency Hospital Admission (emergent condition) 0 X - Simple Discharge Coordination 1 10  - Complex (extensive) Discharge Coordination 0 PROCESS - Special Needs  - Pediatric / Minor Patient Management 0  - Isolation Patient Management 0  - Hearing / Language / Visual special needs 0  - Assessment of Community assistance  (transportation, D/C planning, etc.) 0  - Additional assistance / Altered mentation 0  - Support Surface(s) Assessment (bed, cushion, seat, etc.) 0 INTERVENTIONS - Wound Cleansing / Measurement  X - Simple Wound Cleansing - one wound 1 5  - Complex Wound Cleansing - multiple wounds 0 X - Wound Imaging (photographs - any number of wounds) 1 5  - Wound Tracing (instead of photographs) 0  - Simple Wound Measurement - one wound 0  - Complex Wound Measurement - multiple wounds 0 INTERVENTIONS - Wound Dressings  - Small Wound Dressing one or multiple wounds 0  - Medium Wound Dressing one or multiple wounds 0  - Large Wound Dressing one or multiple wounds 0  - Application of Medications - topical 0  - Application of Medications - injection 0 INTERVENTIONS - Miscellaneous  - External ear exam 0 Tamara Campbell, Tamara C. ([id number])  - Specimen Collection (cultures, biopsies, blood, body fluids, etc.) 0  - Specimen(s) / Culture(s) sent or taken to Lab for analysis 0  - Patient Transfer (multiple staff / Michiel Sites Lift / Similar devices) 0  - Simple Staple / Suture removal (25 or less) 0  - Complex Staple / Suture removal (26 or more) 0  - Hypo / Hyperglycemic Management (close monitor of Blood Glucose) 0  - Ankle / Brachial Index (ABI) - do not check if billed separately 0 X - Vital Signs 1 5 Has the patient been seen at the hospital within the last three years: Yes Total Score: 60 Level Of Care: New/Established - Level 2 Electronic Signature(s) Signed: 10/03/2016 4:47:36 PM By: Alejandro Mulling Entered By: Alejandro Mulling on 10/03/2016 15:03:55 Tamara Campbell ([id number]) -------------------------------------------------------------------------------- Encounter Discharge Information Details Patient Name: Tamara Campbell. Date of Service: 10/03/2016 2:30 PM Medical Record Number: [id number] Patient Account Number: [id number] Date of Birth/Sex: [birth year] (83 y.o.  Female) Treating RN: Ashok Cordia, Debi Primary Care Chenille Toor: Jodi Mourning Other Clinician: Referring Steven Basso: Jodi Mourning Treating Quita Mcgrory/Extender: Rudene Re in Treatment: 8 Encounter Discharge Information Items Discharge Pain Level: 0 Discharge Condition: Stable Ambulatory Status: [place] Discharge Destination: Home Transportation: Private Auto Accompanied By: daughter Schedule Follow-up Appointment: No Medication Reconciliation completed No and provided to Patient/Care Bertice Risse: Provided on Clinical Summary of Care: 10/03/2016 Form Type Recipient Paper Patient AW Electronic Signature(s) Signed: 10/03/2016 2:55:56 PM By: Gwenlyn Perking Entered By: Gwenlyn Perking on 10/03/2016 14:55:55 Tamara Campbell ([id number]) -------------------------------------------------------------------------------- Lower Extremity Assessment Details Patient Name: Tamara Campbell Date of Service: 10/03/2016 2:30 PM Medical Record Number: [id number] Patient Account Number: [id number] Date of Birth/Sex: June 30, 1930 (83 y.o. Female) Treating RN: Ashok Cordia, Debi Primary Care Samual Beals: Jodi Mourning Other Clinician: Referring Masiel Gentzler: Jodi Mourning Treating Shulamit Donofrio/Extender: Rudene Re in Treatment: 8 Vascular Assessment Pulses: Dorsalis Pedis Palpable: [Left:Yes] Posterior Tibial Extremity colors, hair growth, and conditions: Extremity Color: [Left:Hyperpigmented] Temperature of Extremity: [Left:Warm] Capillary Refill: [Left:< 3 seconds] Toe Nail Assessment Left: Right: Thick: No Discolored: No Deformed: No Improper Length and Hygiene: No Electronic Signature(s) Signed: 10/03/2016 4:47:36 PM By: Alejandro Mulling Entered By: Alejandro Mulling on 10/03/2016 14:47:11 Tamara Campbell, Tamara Campbell ([id number]) -------------------------------------------------------------------------------- Multi Wound Chart Details Patient Name: Tamara Campbell. Date of Service: 10/03/2016  2:30 PM Medical Record Number: [id number] Patient Account Number: [id number] Date of Birth/Sex: 1930/12/06 (83 y.o. Female) Treating RN: Ashok Cordia, Debi Primary Care Drayson Dorko: Jodi Mourning Other Clinician: Referring Waylin Dorko: Jodi Mourning Treating Windle Huebert/Extender: Rudene Re in Treatment: 8 Vital Signs Height(in): 62 Pulse(bpm): 69 Weight(lbs): 130 Blood Pressure 142/72 (mmHg): Body Mass Index(BMI): 24 Temperature(F): 98.3 Respiratory Rate 16 (breaths/min): Photos: [1:No Photos] [N/A:N/A] Wound Location: [1:Left Lower Leg - Anterior] [N/A:N/A] Wounding Event: [1:Blister] [N/A:N/A] Primary Etiology: [1:Diabetic Wound/Ulcer  of the Lower Extremity] [N/A:N/A] Comorbid History: [1:Arrhythmia, Congestive Heart Failure, Hypertension, Type II Diabetes, Osteoarthritis] [N/A:N/A] Date Acquired: [1:08/01/2016] [N/A:N/A] Weeks of Treatment: [1:8] [N/A:N/A] Wound Status: [1:Open] [N/A:N/A] Measurements L x W x D 0x0x0 [N/A:N/A] (cm) Area (cm) : [1:0] [N/A:N/A] Volume (cm) : [1:0] [N/A:N/A] % Reduction in Area: [1:100.00%] [N/A:N/A] % Reduction in Volume: 100.00% [N/A:N/A] Classification: [1:Grade 2] [N/A:N/A] Exudate Amount: [1:None Present] [N/A:N/A] Wound Margin: [1:Distinct, outline attached] [N/A:N/A] Granulation Amount: [1:None Present (0%)] [N/A:N/A] Necrotic Amount: [1:None Present (0%)] [N/A:N/A] Exposed Structures: [1:Fat Layer (Subcutaneous Tissue) Exposed: Yes Fascia: No Tendon: No Muscle: No Joint: No Bone: No] [N/A:N/A] Epithelialization: Large (67-100%) N/A N/A Periwound Skin Texture: Excoriation: No N/A N/A Induration: No Callus: No Crepitus: No Rash: No Scarring: No Periwound Skin Maceration: No N/A N/A Moisture: Dry/Scaly: No Periwound Skin Color: Ecchymosis: Yes N/A N/A Hemosiderin Staining: Yes Atrophie Blanche: No Cyanosis: No Erythema: No Mottled: No Pallor: No Rubor: No Temperature: No Abnormality N/A N/A Tenderness on Yes N/A  N/A Palpation: Wound Preparation: Ulcer Cleansing: N/A N/A Rinsed/Irrigated with Saline Topical Anesthetic Applied: None Treatment Notes Electronic Signature(s) Signed: 10/03/2016 3:00:20 PM By: Evlyn Kanner MD, FACS Entered By: Evlyn Kanner on 10/03/2016 15:00:20 Tamara Campbell ([id number]) -------------------------------------------------------------------------------- Multi-Disciplinary Care Plan Details Patient Name: Tamara Campbell Date of Service: 10/03/2016 2:30 PM Medical Record Number: [id number] Patient Account Number: [id number] Date of Birth/Sex: 10/07/1930 (83 y.o. Female) Treating RN: Ashok Cordia, Debi Primary Care Srijan Givan: Jodi Mourning Other Clinician: Referring Brode Sculley: Jodi Mourning Treating Cathey Fredenburg/Extender: Rudene Re in Treatment: 8 Active Inactive Electronic Signature(s) Signed: 10/03/2016 4:47:36 PM By: Alejandro Mulling Entered By: Alejandro Mulling on 10/03/2016 14:45:51 Tamara Campbell ([id number]) -------------------------------------------------------------------------------- Pain Assessment Details Patient Name: Tamara Campbell Date of Service: 10/03/2016 2:30 PM Medical Record Number: [id number] Patient Account Number: [id number] Date of Birth/Sex: [birth year] (83 y.o. Female) Treating RN: Ashok Cordia, Debi Primary Care Jhovany Weidinger: Jodi Mourning Other Clinician: Referring Arrie Borrelli: Jodi Mourning Treating Kili Gracy/Extender: Rudene Re in Treatment: 8 Active Problems Location of Pain Severity and Description of Pain Patient Has Paino No Site Locations With Dressing Change: No Pain Management and Medication Current Pain Management: Electronic Signature(s) Signed: 10/03/2016 4:47:36 PM By: Alejandro Mulling Entered By: Alejandro Mulling on 10/03/2016 14:37:28 Tamara Campbell ([id number]) -------------------------------------------------------------------------------- Patient/Caregiver Education Details Patient Name:  Tamara Campbell Date of Service: 10/03/2016 2:30 PM Medical Record Number: [id number] Patient Account Number: [id number] Date of Birth/Gender: [birth year] (84 y.o. Female) Treating RN: Phillis Haggis Primary Care Physician: Jodi Mourning Other Clinician: Referring Physician: Jodi Mourning Treating Physician/Extender: Rudene Re in Treatment: 8 Education Assessment Education Provided To: Patient Education Topics Provided Wound/Skin Impairment: Handouts: Other: Please call or contact our office if you have any questions or concerns. Methods: Explain/Verbal Responses: State content correctly Electronic Signature(s) Signed: 10/03/2016 4:47:36 PM By: Alejandro Mulling Entered By: Alejandro Mulling on 10/03/2016 14:49:07 Tamara Campbell ([id number]) -------------------------------------------------------------------------------- Wound Assessment Details Patient Name: Tamara Campbell. Date of Service: 10/03/2016 2:30 PM Medical Record Number: [id number] Patient Account Number: [id number] Date of Birth/Sex: 1931/03/05 (83 y.o. Female) Treating RN: Ashok Cordia, Debi Primary Care Ezzie Senat: Jodi Mourning Other Clinician: Referring Xena Propst: Jodi Mourning Treating Lua Feng/Extender: Rudene Re in Treatment: 8 Wound Status Wound Number: 1 Primary Diabetic Wound/Ulcer of the Lower Etiology: Extremity Wound Location: Left Lower Leg - Anterior Wound Open Wounding Event: Blister Status: Date Acquired: 08/01/2016 Comorbid Arrhythmia, Congestive Heart Failure, Weeks Of Treatment: 8 History: Hypertension, Type II Diabetes, Clustered Wound: No Osteoarthritis  Photos Photo Uploaded By: Alejandro Mulling on 10/03/2016 16:45:17 Wound Measurements Length: (cm) Width: (cm) Depth: (cm) Area: (cm) Volume: (cm) 0 % Reduction in Area: 100% 0 % Reduction in Volume: 100% 0 Epithelialization: Large (67-100%) 0 Tunneling: No 0 Undermining: No Wound  Description Classification: Grade 2 Wound Margin: Distinct, outline attached Exudate Amount: None Present Foul Odor After Cleansing: No Slough/Fibrino No Wound Bed Granulation Amount: None Present (0%) Exposed Structure Necrotic Amount: None Present (0%) Fascia Exposed: No Fat Layer (Subcutaneous Tissue) Exposed: Yes Tendon Exposed: No Muscle Exposed: No Joint Exposed: No Tamara Campbell, Tamara C. ([id number]) Bone Exposed: No Periwound Skin Texture Texture Color No Abnormalities Noted: No No Abnormalities Noted: No Callus: No Atrophie Blanche: No Crepitus: No Cyanosis: No Excoriation: No Ecchymosis: Yes Induration: No Erythema: No Rash: No Hemosiderin Staining: Yes Scarring: No Mottled: No Pallor: No Moisture Rubor: No No Abnormalities Noted: No Dry / Scaly: No Temperature / Pain Maceration: No Temperature: No Abnormality Tenderness on Palpation: Yes Wound Preparation Ulcer Cleansing: Rinsed/Irrigated with Saline Topical Anesthetic Applied: None Electronic Signature(s) Signed: 10/03/2016 4:47:36 PM By: Alejandro Mulling Entered By: Alejandro Mulling on 10/03/2016 14:46:36 Tamara Campbell, Tamara Campbell ([id number]) -------------------------------------------------------------------------------- Vitals Details Patient Name: Tamara Campbell. Date of Service: 10/03/2016 2:30 PM Medical Record Number: [id number] Patient Account Number: [id number] Date of Birth/Sex: 1931/05/31 (83 y.o. Female) Treating RN: Ashok Cordia, Debi Primary Care Amaiya Scruton: Jodi Mourning Other Clinician: Referring Shamell Hittle: Jodi Mourning Treating Tonie Elsey/Extender: Rudene Re in Treatment: 8 Vital Signs Time Taken: 14:42 Temperature (F): 98.3 Height (in): 62 Pulse (bpm): 69 Weight (lbs): 130 Respiratory Rate (breaths/min): 16 Body Mass Index (BMI): 23.8 Blood Pressure (mmHg): 142/72 Reference Range: 80 - 120 mg / dl Electronic Signature(s) Signed: 10/03/2016 4:47:36 PM By: Alejandro Mulling Entered By: Alejandro Mulling on 10/03/2016 14:42:55

## 2016-10-04 NOTE — Progress Notes (Signed)
Tamara, HOGENSON ([id number]) Visit Report for 10/03/2016 Chief Complaint Document Details Patient Name: Tamara Campbell, Tamara Campbell. Date of Service: 10/03/2016 2:30 PM Medical Record Number: [id number] Patient Account Number: [id number] Date of Birth/Sex: 07/25/1930 (84 y.o. Female) Treating RN: Ashok Cordia, Debi Primary Care Provider: Jodi Mourning Other Clinician: Referring Provider: Jodi Mourning Treating Provider/Extender: Rudene Re in Treatment: 8 Information Obtained from: Patient Chief Complaint Patient presents to the wound care center for a consult due non healing wound both lower extremities with swelling for the last 2 weeks Electronic Signature(s) Signed: 10/03/2016 3:00:31 PM By: Evlyn Kanner MD, FACS Entered By: Evlyn Kanner on 10/03/2016 15:00:31 Tamara Campbell ([id number]) -------------------------------------------------------------------------------- HPI Details Patient Name: Tamara Campbell. Date of Service: 10/03/2016 2:30 PM Medical Record Number: [id number] Patient Account Number: [id number] Date of Birth/Sex: 06/16/1930 (83 y.o. Female) Treating RN: Ashok Cordia, Debi Primary Care Provider: Jodi Mourning Other Clinician: Referring Provider: Jodi Mourning Treating Provider/Extender: Rudene Re in Treatment: 8 History of Present Illness Location: both lower extremities Quality: Patient reports No Pain. Severity: Patient states wound (s) are getting better. Duration: Patient has had the wound for < 2 weeks prior to presenting for treatment Context: The wound appeared gradually over time Modifying Factors: Other treatment(s) tried include:Silvadene cream and compression wraps Associated Signs and Symptoms: Patient reports having increase swelling. HPI Description: 83 year old patient who has been sent to see [place] by her PCP Dr. Yates Decamp, for open wounds on her left lower extremity, limited to breakdown of skin, possibly due to venous stasis ulcer,  has a past medical history of diabetes mellitus type 2, status post cardiac pacemaker, essential hypertension, atrial fibrillation, CHF, cardiomyopathy, ventricular tachycardia, status post right total knee arthroplasty in 2014, back surgery in 1996, hysterectomy. She has never been a smoker. her PCP found that she had stasis edema of both lower extremities left worse than right and had ulceration on the lateral aspect of her left leg. Last hemoglobin A1c done and February of this year was 6.3% patient's daughter tells me that she has seen at [facility] for an opinion about a year ago and hence we will try and obtain these reports regarding the workup done and the recommendations made. 08/22/2016 -- -- the patient was seen by Dr. Gilda Crease on 06/22/2015 and after examination he made a note of her atherosclerosis of the lower extremities with ulceration and recommended noninvasive studies. The patient was seen back in follow-up on 07/06/2015 and her right ABI was 1.13 and the left was 1.20 with triphasic pedal signals bilaterally. Duplex ultrasound of the left lower extremity showed SFA, popliteal and tibial arteries with diffuse disease but no focal stenosis and triphasic. His impression was since these arterial studies were normal he thought the problem for the ulceration was more venous then a peripheral arterial disease. Commended to continue with graduated compression and follow back in 3 months. Electronic Signature(s) Signed: 10/03/2016 3:00:41 PM By: Evlyn Kanner MD, FACS Entered By: Evlyn Kanner on 10/03/2016 15:00:40 Tamara Campbell ([id number]) -------------------------------------------------------------------------------- Physical Exam Details Patient Name: Tamara Campbell Date of Service: 10/03/2016 2:30 PM Medical Record Number: [id number] Patient Account Number: [id number] Date of Birth/Sex: [birth year] (83 y.o. Female) Treating RN: Ashok Cordia, Debi Primary Care  Provider: Jodi Mourning Other Clinician: Referring Provider: Jodi Mourning Treating Provider/Extender: Rudene Re in Treatment: 8 Constitutional . Pulse regular. Respirations normal and unlabored. Afebrile. . Eyes Nonicteric. Reactive to light. Ears, Nose, Mouth, and Throat Lips, teeth, and  gums WNL.Marland Kitchen Moist mucosa without lesions. Neck supple and nontender. No palpable supraclavicular or cervical adenopathy. Normal sized without goiter. Respiratory WNL. No retractions.. Breath sounds WNL, No rubs, rales, rhonchi, or wheeze.. Cardiovascular Heart rhythm and rate regular, no murmur or gallop.. Pedal Pulses WNL. No clubbing, cyanosis or edema. Chest Breasts symmetical and no nipple discharge.. Breast tissue WNL, no masses, lumps, or tenderness.. Lymphatic No adneopathy. No adenopathy. No adenopathy. Musculoskeletal Adexa without tenderness or enlargement.. Digits and nails w/o clubbing, cyanosis, infection, petechiae, ischemia, or inflammatory conditions.. Integumentary (Hair, Skin) No suspicious lesions. No crepitus or fluctuance. No peri-wound warmth or erythema. No masses.Marland Kitchen Psychiatric Judgement and insight Intact.. No evidence of depression, anxiety, or agitation.. Notes her lymphedema is looking very good and the wound is completely healed. Electronic Signature(s) Signed: 10/03/2016 3:01:01 PM By: Evlyn Kanner MD, FACS Entered By: Evlyn Kanner on 10/03/2016 15:01:00 Tamara Campbell ([id number]) -------------------------------------------------------------------------------- Physician Orders Details Patient Name: Tamara Campbell Date of Service: 10/03/2016 2:30 PM Medical Record Number: [id number] Patient Account Number: [id number] Date of Birth/Sex: Jul 27, 1930 (83 y.o. Female) Treating RN: Ashok Cordia, Debi Primary Care Provider: Jodi Mourning Other Clinician: Referring Provider: Jodi Mourning Treating Provider/Extender: Rudene Re in  Treatment: 8 Verbal / Phone Orders: Yes Clinician: Ashok Cordia, Debi Read Back and Verified: Yes Diagnosis Coding Discharge From [facility] Services o Discharge from Wound Care Center - Wear your compression wraps everyday and take off at night. Please call or contact our office if you have any questions or concerns. Electronic Signature(s) Signed: 10/03/2016 4:45:53 PM By: Evlyn Kanner MD, FACS Signed: 10/03/2016 4:47:36 PM By: Alejandro Mulling Entered By: Alejandro Mulling on 10/03/2016 14:48:31 Tamara Campbell ([id number]) -------------------------------------------------------------------------------- Problem List Details Patient Name: CHARLEN, BAKULA. Date of Service: 10/03/2016 2:30 PM Medical Record Number: [id number] Patient Account Number: [id number] Date of Birth/Sex: 01-Jul-1930 (83 y.o. Female) Treating RN: Ashok Cordia, Debi Primary Care Provider: Jodi Mourning Other Clinician: Referring Provider: Jodi Mourning Treating Provider/Extender: Rudene Re in Treatment: 8 Active Problems ICD-10 Encounter Code Description Active Date Diagnosis E11.622 Type 2 diabetes mellitus with other skin ulcer 08/08/2016 Yes I89.0 Lymphedema, not elsewhere classified 08/08/2016 Yes L97.221 Non-pressure chronic ulcer of left calf limited to 08/08/2016 Yes breakdown of skin L97.211 Non-pressure chronic ulcer of right calf limited to 08/08/2016 Yes breakdown of skin Inactive Problems Resolved Problems Electronic Signature(s) Signed: 10/03/2016 3:00:13 PM By: Evlyn Kanner MD, FACS Entered By: Evlyn Kanner on 10/03/2016 15:00:13 Tamara Campbell ([id number]) -------------------------------------------------------------------------------- Progress Note Details Patient Name: Tamara Campbell. Date of Service: 10/03/2016 2:30 PM Medical Record Number: [id number] Patient Account Number: [id number] Date of Birth/Sex: March 21, 1931 (83 y.o. Female) Treating RN: Ashok Cordia, Debi Primary Care Provider:  Jodi Mourning Other Clinician: Referring Provider: Jodi Mourning Treating Provider/Extender: Rudene Re in Treatment: 8 Subjective Chief Complaint Information obtained from Patient Patient presents to the wound care center for a consult due non healing wound both lower extremities with swelling for the last 2 weeks History of Present Illness (HPI) The following HPI elements were documented for the patient's wound: Location: both lower extremities Quality: Patient reports No Pain. Severity: Patient states wound (s) are getting better. Duration: Patient has had the wound for < 2 weeks prior to presenting for treatment Context: The wound appeared gradually over time Modifying Factors: Other treatment(s) tried include:Silvadene cream and compression wraps Associated Signs and Symptoms: Patient reports having increase swelling. 83 year old patient who has been sent to see [place] by her PCP Dr. Yates Decamp,  for open wounds on her left lower extremity, limited to breakdown of skin, possibly due to venous stasis ulcer, has a past medical history of diabetes mellitus type 2, status post cardiac pacemaker, essential hypertension, atrial fibrillation, CHF, cardiomyopathy, ventricular tachycardia, status post right total knee arthroplasty in 2014, back surgery in 1996, hysterectomy. She has never been a smoker. her PCP found that she had stasis edema of both lower extremities left worse than right and had ulceration on the lateral aspect of her left leg. Last hemoglobin A1c done and February of this year was 6.3% patient's daughter tells me that she has seen at [facility] for an opinion about a year ago and hence we will try and obtain these reports regarding the workup done and the recommendations made. 08/22/2016 -- -- the patient was seen by Dr. Gilda Crease on 06/22/2015 and after examination he made a note of her atherosclerosis of the lower extremities with ulceration  and recommended noninvasive studies. The patient was seen back in follow-up on 07/06/2015 and her right ABI was 1.13 and the left was 1.20 with triphasic pedal signals bilaterally. Duplex ultrasound of the left lower extremity showed SFA, popliteal and tibial arteries with diffuse disease but no focal stenosis and triphasic. His impression was since these arterial studies were normal he thought the problem for the ulceration was more venous then a peripheral arterial disease. Commended to continue with graduated compression and follow back in 3 months. [place], [place] ([id number]) Objective Constitutional Pulse regular. Respirations normal and unlabored. Afebrile. Vitals Time Taken: 2:42 PM, Height: 62 in, Weight: 130 lbs, BMI: 23.8, Temperature: 98.3 F, Pulse: 69 bpm, Respiratory Rate: 16 breaths/min, Blood Pressure: 142/72 mmHg. Eyes Nonicteric. Reactive to light. Ears, Nose, Mouth, and Throat Lips, teeth, and gums WNL.Marland Kitchen Moist mucosa without lesions. Neck supple and nontender. No palpable supraclavicular or cervical adenopathy. Normal sized without goiter. Respiratory WNL. No retractions.. Breath sounds WNL, No rubs, rales, rhonchi, or wheeze.. Cardiovascular Heart rhythm and rate regular, no murmur or gallop.. Pedal Pulses WNL. No clubbing, cyanosis or edema. Chest Breasts symmetical and no nipple discharge.. Breast tissue WNL, no masses, lumps, or tenderness.. Lymphatic No adneopathy. No adenopathy. No adenopathy. Musculoskeletal Adexa without tenderness or enlargement.. Digits and nails w/o clubbing, cyanosis, infection, petechiae, ischemia, or inflammatory conditions.Marland Kitchen Psychiatric Judgement and insight Intact.. No evidence of depression, anxiety, or agitation.. General Notes: her lymphedema is looking very good and the wound is completely healed. Integumentary (Hair, Skin) No suspicious lesions. No crepitus or fluctuance. No peri-wound warmth or erythema. No masses.. Wound  #1 status is Open. Original cause of wound was Blister. The wound is located on the Left,Anterior Lower Leg. The wound measures 0cm length x 0cm width x 0cm depth; 0cm^2 area and 0cm^3 volume. There is Fat Layer (Subcutaneous Tissue) Exposed exposed. There is no tunneling or undermining noted. There is a none present amount of drainage noted. The wound margin is distinct with the outline attached to [place], Kristeen C. ([id number]) the wound base. There is no granulation within the wound bed. There is no necrotic tissue within the wound bed. The periwound skin appearance exhibited: Ecchymosis, Hemosiderin Staining. The periwound skin appearance did not exhibit: Callus, Crepitus, Excoriation, Induration, Rash, Scarring, Dry/Scaly, Maceration, Atrophie Blanche, Cyanosis, Mottled, Pallor, Rubor, Erythema. Periwound temperature was noted as No Abnormality. The periwound has tenderness on palpation. Assessment Active Problems ICD-10 E11.622 - Type 2 diabetes mellitus with other skin ulcer I89.0 - Lymphedema, not elsewhere classified L97.221 - Non-pressure chronic  ulcer of left calf limited to breakdown of skin L97.211 - Non-pressure chronic ulcer of right calf limited to breakdown of skin Plan Discharge From [facility] Services: Discharge from Wound Care Center - Wear your compression wraps everyday and take off at night. Please call or contact our office if you have any questions or concerns. Her wound is completely healed and the lymphedema is looking good. I have discharged her from the wound care services with the recommended: 1. Foam with a Juxtalite to be applied to the left lower extremitie. 2. juxta light compressions to be used on the right lower extremity. 3. elevation and exercise discussed with her in detail. 4. return to the wound care center only if needed Electronic Signature(s) Signed: 10/03/2016 3:02:50 PM By: Evlyn Kanner MD, FACS Entered By: Evlyn Kanner on 10/03/2016  15:02:49 Tamara Campbell ([id number]) -------------------------------------------------------------------------------- SuperBill Details Patient Name: Tamara Campbell Date of Service: 10/03/2016 Medical Record Number: [id number] Patient Account Number: [id number] Date of Birth/Sex: [birth year] (83 y.o. Female) Treating RN: Ashok Cordia, Debi Primary Care Provider: Jodi Mourning Other Clinician: Referring Provider: Jodi Mourning Treating Provider/Extender: Rudene Re in Treatment: 8 Diagnosis Coding ICD-10 Codes Code Description [phone] Type 2 diabetes mellitus with other skin ulcer I89.0 Lymphedema, not elsewhere classified L97.221 Non-pressure chronic ulcer of left calf limited to breakdown of skin L97.211 Non-pressure chronic ulcer of right calf limited to breakdown of skin Facility Procedures CPT4 Code: [id number] Description: [phone] - WOUND CARE VISIT-LEV 2 EST PT Modifier: Quantity: 1 Physician Procedures CPT4 Code Description: [id number] - WC PHYS LEVEL 2 - EST PT ICD-10 Description Diagnosis E11.622 Type 2 diabetes mellitus with other skin ulcer I89.0 Lymphedema, not elsewhere classified L97.221 Non-pressure chronic ulcer of left calf limited to  L97.211 Non-pressure chronic ulcer of right calf limited to Modifier: breakdown of ski breakdown of sk Quantity: 1 n in Electronic Signature(s) Signed: 10/03/2016 4:45:53 PM By: Evlyn Kanner MD, FACS Signed: 10/03/2016 4:47:36 PM By: Alejandro Mulling Previous Signature: 10/03/2016 3:03:05 PM Version By: Evlyn Kanner MD, FACS Entered By: Alejandro Mulling on 10/03/2016 15:04:09

## 2016-10-10 DIAGNOSIS — E119 Type 2 diabetes mellitus without complications: Secondary | ICD-10-CM | POA: Diagnosis not present

## 2016-10-17 DIAGNOSIS — I480 Paroxysmal atrial fibrillation: Secondary | ICD-10-CM | POA: Diagnosis not present

## 2016-10-17 DIAGNOSIS — I10 Essential (primary) hypertension: Secondary | ICD-10-CM | POA: Diagnosis not present

## 2016-10-17 DIAGNOSIS — E119 Type 2 diabetes mellitus without complications: Secondary | ICD-10-CM | POA: Diagnosis not present

## 2016-12-05 DIAGNOSIS — B351 Tinea unguium: Secondary | ICD-10-CM | POA: Diagnosis not present

## 2016-12-05 DIAGNOSIS — E1142 Type 2 diabetes mellitus with diabetic polyneuropathy: Secondary | ICD-10-CM | POA: Diagnosis not present

## 2016-12-26 ENCOUNTER — Other Ambulatory Visit: Payer: Self-pay | Admitting: Internal Medicine

## 2016-12-26 DIAGNOSIS — R300 Dysuria: Secondary | ICD-10-CM | POA: Diagnosis not present

## 2016-12-26 DIAGNOSIS — R4182 Altered mental status, unspecified: Secondary | ICD-10-CM | POA: Diagnosis not present

## 2016-12-27 ENCOUNTER — Emergency Department: Payer: Medicare HMO

## 2016-12-27 ENCOUNTER — Encounter: Payer: Self-pay | Admitting: Emergency Medicine

## 2016-12-27 ENCOUNTER — Observation Stay
Admission: EM | Admit: 2016-12-27 | Discharge: 2016-12-30 | Disposition: A | Discharge status: Skilled Nursing Facility | Payer: Medicare HMO | Attending: Internal Medicine | Admitting: Internal Medicine

## 2016-12-27 DIAGNOSIS — N390 Urinary tract infection, site not specified: Principal | ICD-10-CM | POA: Diagnosis present

## 2016-12-27 DIAGNOSIS — R531 Weakness: Secondary | ICD-10-CM

## 2016-12-27 DIAGNOSIS — Z8673 Personal history of transient ischemic attack (TIA), and cerebral infarction without residual deficits: Secondary | ICD-10-CM | POA: Insufficient documentation

## 2016-12-27 DIAGNOSIS — I517 Cardiomegaly: Secondary | ICD-10-CM | POA: Diagnosis not present

## 2016-12-27 DIAGNOSIS — I700 Atherosclerosis of aorta: Secondary | ICD-10-CM | POA: Diagnosis not present

## 2016-12-27 DIAGNOSIS — Z950 Presence of cardiac pacemaker: Secondary | ICD-10-CM | POA: Insufficient documentation

## 2016-12-27 DIAGNOSIS — E119 Type 2 diabetes mellitus without complications: Secondary | ICD-10-CM | POA: Diagnosis not present

## 2016-12-27 DIAGNOSIS — Z87891 Personal history of nicotine dependence: Secondary | ICD-10-CM | POA: Diagnosis not present

## 2016-12-27 DIAGNOSIS — I4891 Unspecified atrial fibrillation: Secondary | ICD-10-CM | POA: Diagnosis not present

## 2016-12-27 DIAGNOSIS — Z7901 Long term (current) use of anticoagulants: Secondary | ICD-10-CM | POA: Insufficient documentation

## 2016-12-27 DIAGNOSIS — Z66 Do not resuscitate: Secondary | ICD-10-CM | POA: Diagnosis not present

## 2016-12-27 DIAGNOSIS — I081 Rheumatic disorders of both mitral and tricuspid valves: Secondary | ICD-10-CM | POA: Insufficient documentation

## 2016-12-27 DIAGNOSIS — I110 Hypertensive heart disease with heart failure: Secondary | ICD-10-CM | POA: Insufficient documentation

## 2016-12-27 DIAGNOSIS — Z7984 Long term (current) use of oral hypoglycemic drugs: Secondary | ICD-10-CM | POA: Diagnosis not present

## 2016-12-27 DIAGNOSIS — E785 Hyperlipidemia, unspecified: Secondary | ICD-10-CM | POA: Diagnosis not present

## 2016-12-27 DIAGNOSIS — I509 Heart failure, unspecified: Secondary | ICD-10-CM | POA: Insufficient documentation

## 2016-12-27 DIAGNOSIS — R5383 Other fatigue: Secondary | ICD-10-CM | POA: Diagnosis not present

## 2016-12-27 LAB — TROPONIN I: Troponin I: <0.03 ng/mL (ref <0.03)

## 2016-12-27 LAB — URINALYSIS, COMPLETE (UACMP) WITH MICROSCOPIC
BACTERIA UA: NONE SEEN
BILIRUBIN URINE: NEGATIVE
GLUCOSE, UA: NEGATIVE mg/dL
KETONES UR: NEGATIVE mg/dL
NITRITE: NEGATIVE
PH: 5 (ref 5.0–8.0)
Protein, ur: NEGATIVE mg/dL
SPECIFIC GRAVITY, URINE: 1.012 (ref 1.005–1.030)

## 2016-12-27 LAB — CBC
HEMATOCRIT: 40.6 % (ref 35.0–47.0)
Hemoglobin: 13.3 g/dL (ref 12.0–16.0)
MCH: 27.2 pg (ref 26.0–34.0)
MCHC: 32.8 g/dL (ref 32.0–36.0)
MCV: 82.9 fL (ref 80.0–100.0)
Platelets: 192 10*3/uL (ref 150–440)
RBC: 4.89 MIL/uL (ref 3.80–5.20)
RDW: 14.8 % — AB (ref 11.5–14.5)
WBC: 3.4 10*3/uL — ABNORMAL LOW (ref 3.6–11.0)

## 2016-12-27 LAB — COMPREHENSIVE METABOLIC PANEL
ALBUMIN: 4 g/dL (ref 3.5–5.0)
ALK PHOS: 90 U/L (ref 38–126)
ALT: 15 U/L (ref 14–54)
ANION GAP: 10 (ref 5–15)
AST: 22 U/L (ref 15–41)
BILIRUBIN TOTAL: 0.8 mg/dL (ref 0.3–1.2)
BUN: 21 mg/dL — ABNORMAL HIGH (ref 6–20)
CALCIUM: 9.7 mg/dL (ref 8.9–10.3)
CO2: 25 mmol/L (ref 22–32)
Chloride: 105 mmol/L (ref 101–111)
Creatinine, Ser: 1.02 mg/dL — ABNORMAL HIGH (ref 0.44–1.00)
GFR calc non Af Amer: 49 mL/min — ABNORMAL LOW (ref >60)
GFR, EST AFRICAN AMERICAN: 57 mL/min — AB (ref >60)
GLUCOSE: 99 mg/dL (ref 65–99)
POTASSIUM: 3.6 mmol/L (ref 3.5–5.1)
SODIUM: 140 mmol/L (ref 135–145)
TOTAL PROTEIN: 7.5 g/dL (ref 6.5–8.1)

## 2016-12-27 LAB — LACTIC ACID, PLASMA: Lactic Acid, Venous: 1.1 mmol/L (ref 0.5–1.9)

## 2016-12-27 MED ORDER — DEXTROSE 5 % IV SOLN
1.0000 g | Freq: Once | INTRAVENOUS | Status: AC
  Administered 2016-12-27: 1 g via INTRAVENOUS
  Filled 2016-12-27: qty 10

## 2016-12-27 NOTE — ED Provider Notes (Signed)
[facility] Emergency Department Provider Note  Time seen: 9:04 PM  I have reviewed the triage vital signs and the nursing notes.   HISTORY  Chief Complaint No chief complaint on file.    HPI Tamara SCHABEL is a 83 y.o. female with a past medical history of CHF, diabetes, hypertension, CVA, pacemaker, presents the emergency department for generalized weakness. According to the patient and EMS report the patient has been experiencing generalized fatigue and weakness today. They called her primary care doctor who requested she obtain a urine sample. Patient states she went to the bathroom at 4 PM to try to get a urine sample but was too weak to get back cough the toilet. EMS states the patient was still on the toilet when they arrived approximate 4-5 hours later. Patient denies any focal weakness. Denies any cough or congestion chest pain or shortness breath. Denies abdominal pain, nausea vomiting or diarrhea. Denies dysuria. Denies fever.  Past Medical History:  Diagnosis Date  . CHF (congestive heart failure) (HCC)   . Diabetes mellitus without complication (HCC)   . Hypertension   . Pacemaker   . Stroke [facility])     Patient Active Problem List   Diagnosis Date Noted  . Acute encephalopathy 04/26/2016  . Embolic stroke (HCC) 03/16/2015  . Risk for falls 03/16/2015  . Essential hypertension 01/13/2015  . Hyperlipidemia LDL goal <70 01/13/2015  . Diabetes type 2, controlled (HCC) 01/13/2015  . Paroxysmal ventricular tachycardia (HCC) 01/13/2015  . Hypokalemia 01/13/2015  . Open wound of left lower leg 01/13/2015  . Cerebral infarction due to embolism of middle cerebral artery (HCC)   . Atrial fibrillation (HCC) 01/10/2015  . Cardiomyopathy (HCC) 01/10/2015  . Cerebral infarction (HCC) 01/08/2015    Past Surgical History:  Procedure Laterality Date  . ABDOMINAL HYSTERECTOMY    . JOINT REPLACEMENT    . PACEMAKER PLACEMENT      Prior to Admission  medications   Medication Sig Start Date End Date Taking? Authorizing Provider  acetaminophen (TYLENOL) 500 MG tablet Take 500 mg by mouth every 4 (four) hours as needed for mild pain or fever.    [provider]  amLODipine-benazepril (LOTREL) 10-20 MG capsule Take 1 capsule by mouth daily.    [provider]  apixaban (ELIQUIS) 5 MG TABS tablet Take 1 tablet (5 mg total) by mouth 2 (two) times daily. 01/13/15   Layne Benton, NP  atorvastatin (LIPITOR) 20 MG tablet Take 1 tablet (20 mg total) by mouth daily at 6 PM. 01/13/15   Layne Benton, NP  bimatoprost (LUMIGAN) 0.01 % SOLN Place 1 drop into both eyes at bedtime.    [provider]  carvedilol (COREG) 6.25 MG tablet Take 1 tablet (6.25 mg total) by mouth 2 (two) times daily with a meal. 01/13/15   Layne Benton, NP  cholecalciferol (VITAMIN D) 1000 UNITS tablet Take 2,000 Units by mouth daily.    [provider]  hydrochlorothiazide (HYDRODIURIL) 25 MG tablet Take 25 mg by mouth daily.    [provider]  metFORMIN (GLUCOPHAGE) 500 MG tablet Take 500 mg by mouth 2 (two) times daily with a meal.    [provider]  Multiple Vitamin (MULTIVITAMIN WITH MINERALS) TABS tablet Take 1 tablet by mouth daily.    [provider]  neomycin-bacitracin-polymyxin (NEOSPORIN) OINT Apply 1 application topically daily. Apply Neosporin to left leg wound Q day, then cover with foam dressing.  Change foam dressing Q  5 days or PRN soiling. Patient not taking: Reported on 04/25/2016 01/13/15   Layne BentonBiby, Sharon L, NP  potassium chloride (K-DUR,KLOR-CON) 10 MEQ tablet Take 10 mEq by mouth daily.    [provider]    No Known Allergies  Family History  Problem Relation Age of Onset  . Stroke Sister     Social History Social History  Substance Use Topics  . Smoking status: Former Games developermoker  . Smokeless tobacco: Never Used  . Alcohol use No    Review of Systems Constitutional: Negative for  fever.Positive for generalized weakness. ENT: Negative for congestion Cardiovascular: Negative for chest pain. Respiratory: Negative for shortness of breath.Negative for cough. Gastrointestinal: Negative for abdominal pain, vomiting and diarrhea. Genitourinary: Negative for dysuria. Skin: Negative for rash. Neurological: Negative for headache All other ROS negative  ____________________________________________   PHYSICAL EXAM:  Constitutional: Alert, disoriented to time. Well appearing and in no distress. Answers questions appropriately and follows all commands. Eyes: Normal exam ENT   Head: Normocephalic and atraumatic.   Mouth/Throat: Mucous membranes are moist. Cardiovascular: Normal rate, regular rhythm. No murmur Respiratory: Normal respiratory effort without tachypnea nor retractions. Breath sounds are clear   Gastrointestinal: Soft and nontender. No distention. Musculoskeletal: Nontender with normal range of motion in all extremities. Mild lower Sharon edema, equal bilaterally. Neurologic:  Normal speech and language. Equal grip strengths bilaterally. No drift. Patient does have 3/5 strength in bilateral lower extremities but this also appears equal. Skin:  Skin is warm, dry and intact.  Psychiatric: Mood and affect are normal.  ____________________________________________    EKG  EKG reviewed and interpreted by myself shows a paced rhythm at 72 bpm with a widened QRS.    INITIAL IMPRESSION / ASSESSMENT AND PLAN / ED COURSE  Pertinent labs & imaging results that were available during my care of the patient were reviewed by me and considered in my medical decision making (see chart for details).  Patient presents the emergency department for generalized fatigue/weakness. We will check labs and continue to closely monitor in the emergency department. Overall patient appears well, nontoxic. No focal deficits identified  Patient's labs show a possible urinary tract  infection with too numerous count red cells 6-30 white blood cells. We will treat with Rocephin, send urine culture. Patient's lab work is otherwise largely at her baseline. Lactic acid is negative. We'll obtain a CT scan as well as a chest x-ray. Patient will be admitted to the hospital for generalized weakness that she is not able to ambulate currently.  ____________________________________________   FINAL CLINICAL IMPRESSION(S) / ED DIAGNOSES  Generalized weakness    Minna AntisPaduchowski, Husein Guedes, MD 12/27/16 2252

## 2016-12-27 NOTE — H&P (Signed)
History and Physical   [facility] - [place] @ [facility] Admission History and Physical [employer], D.O.    Patient Name: Tamara Campbell MR#: [id number] Date of Birth: 28-Jan-1931 Date of Admission: 12/27/2016  Referring MD/NP/PA: Dr. Lenard Lance Primary Care Physician: Rafael Bihari, MD  Chief Complaint:  Chief Complaint  Patient presents with  . Weakness    HPI: Tamara Campbell is a 83 y.o. female with a known history of CHF, DM, HTN, CVA, PPM presents to the emergency department for evaluation of weakness.  Patient was in a usual state of health until this afternoon she sat on the toilet but was took weak to stand back up.  EMS found her about 5 hours later.  She reports symptoms of generalized weakness, decreased by mouth intake, decreased mobility.  Patient denies fevers/chills, weakness, dizziness, chest pain, shortness of breath, N/V/C/D, abdominal pain, dysuria/frequency, changes in mental status.    Otherwise there has been no change in status. Patient has been taking medication as prescribed and there has been no recent change in medication or diet.  No recent antibiotics.  There has been no recent illness, hospitalizations, travel or sick contacts.    EMS/ED Course: Patient received Rocephin. Medical admission has been requested for further management of UTI, generalized weakness.  Review of Systems:  CONSTITUTIONAL: Positive weakness. No fever/chills, fatigue, weight gain/loss, headache. EYES: No blurry or double vision. ENT: No tinnitus, postnasal drip, redness or soreness of the oropharynx. RESPIRATORY: No cough, dyspnea, wheeze.  No hemoptysis.  CARDIOVASCULAR: No chest pain, palpitations, syncope, orthopnea. No lower extremity edema.  GASTROINTESTINAL: No nausea, vomiting, abdominal pain, diarrhea, constipation.  No hematemesis, melena or hematochezia. GENITOURINARY: No dysuria, frequency, hematuria. ENDOCRINE: No polyuria or nocturia. No heat or cold  intolerance. HEMATOLOGY: No anemia, bruising, bleeding. INTEGUMENTARY: No rashes, ulcers, lesions. MUSCULOSKELETAL: No arthritis, gout, dyspnea. NEUROLOGIC: No numbness, tingling, ataxia, seizure-type activity, weakness. PSYCHIATRIC: No anxiety, depression, insomnia.   Past Medical History:  Diagnosis Date  . CHF (congestive heart failure) (HCC)   . Diabetes mellitus without complication (HCC)   . Hypertension   . Pacemaker   . Stroke [facility])     Past Surgical History:  Procedure Laterality Date  . ABDOMINAL HYSTERECTOMY    . JOINT REPLACEMENT    . PACEMAKER PLACEMENT       reports that she has quit smoking. She has never used smokeless tobacco. She reports that she does not drink alcohol or use drugs.  No Known Allergies  Family History  Problem Relation Age of Onset  . Stroke Sister     Prior to Admission medications   Medication Sig Start Date End Date Taking? Authorizing Provider  acetaminophen (TYLENOL) 500 MG tablet Take 500 mg by mouth every 4 (four) hours as needed for mild pain or fever.    [provider]  amLODipine-benazepril (LOTREL) 10-20 MG capsule Take 1 capsule by mouth daily.    [provider]  apixaban (ELIQUIS) 5 MG TABS tablet Take 1 tablet (5 mg total) by mouth 2 (two) times daily. 01/13/15   Layne Benton, NP  atorvastatin (LIPITOR) 20 MG tablet Take 1 tablet (20 mg total) by mouth daily at 6 PM. 01/13/15   Layne Benton, NP  bimatoprost (LUMIGAN) 0.01 % SOLN Place 1 drop into both eyes at bedtime.    [provider]  carvedilol (COREG) 6.25 MG tablet Take 1 tablet (6.25 mg total) by mouth 2 (two) times daily with a meal. 01/13/15  Layne BentonBiby, Sharon L, NP  cholecalciferol (VITAMIN D) 1000 UNITS tablet Take 2,000 Units by mouth daily.    [provider]  hydrochlorothiazide (HYDRODIURIL) 25 MG tablet Take 25 mg by mouth daily.    [provider]  metFORMIN (GLUCOPHAGE) 500 MG tablet Take 500 mg by mouth 2 (two) times  daily with a meal.    [provider]  Multiple Vitamin (MULTIVITAMIN WITH MINERALS) TABS tablet Take 1 tablet by mouth daily.    [provider]  neomycin-bacitracin-polymyxin (NEOSPORIN) OINT Apply 1 application topically daily. Apply Neosporin to left leg wound Q day, then cover with foam dressing.  Change foam dressing Q 5 days or PRN soiling. Patient not taking: Reported on 04/25/2016 01/13/15   Layne BentonBiby, Sharon L, NP  potassium chloride (K-DUR,KLOR-CON) 10 MEQ tablet Take 10 mEq by mouth daily.    [provider]    Physical Exam: Vitals:   12/27/16 2106 12/27/16 2107  BP: (!) 183/123   Pulse: 70   Resp: 20   Temp: 97.6 F (36.4 C)   TempSrc: Oral   SpO2: 98%   Weight:  61.7 kg (136 lb)  Height:  5\' 1"  (1.549 m)    GENERAL: 83 y.o.-year-old female patient, well-developed, well-nourished lying in the bed in no acute distress.  Pleasant and cooperative.   HEENT: Head atraumatic, normocephalic. Pupils equal. Mucus membranes moist. NECK: Supple, full range of motion. No JVD, no bruit heard. No thyroid enlargement, no tenderness, no cervical lymphadenopathy. CHEST: Normal breath sounds bilaterally. No wheezing, rales, rhonchi or crackles. No use of accessory muscles of respiration.  No reproducible chest wall tenderness.  CARDIOVASCULAR: S1, S2 normal. No murmurs, rubs, or gallops. Cap refill <2 seconds. Pulses intact distally.  ABDOMEN: Soft, nondistended, nontender. No rebound, guarding, rigidity. Normoactive bowel sounds present in all four quadrants.  EXTREMITIES: No pedal edema, cyanosis, or clubbing. No calf tenderness or Homan's sign.  NEUROLOGIC: The patient is alert and oriented x 3. Cranial nerves II through XII are grossly intact with no focal sensorimotor deficit. PSYCHIATRIC:  Normal affect, mood, thought content. SKIN: Warm, dry, and intact without obvious rash, lesion, or ulcer.    Labs on Admission:  CBC:  Recent Labs Lab 12/27/16 2105   WBC 3.4*  HGB 13.3  HCT 40.6  MCV 82.9  PLT 192   Basic Metabolic Panel:  Recent Labs Lab 12/27/16 2105  NA 140  K 3.6  CL 105  CO2 25  GLUCOSE 99  BUN 21*  CREATININE 1.02*  CALCIUM 9.7   GFR: Estimated Creatinine Clearance: 35.2 mL/min (A) (by C-G formula based on SCr of 1.02 mg/dL (H)). Liver Function Tests:  Recent Labs Lab 12/27/16 2105  AST 22  ALT 15  ALKPHOS 90  BILITOT 0.8  PROT 7.5  ALBUMIN 4.0   No results for input(s): LIPASE, AMYLASE in the last 168 hours. No results for input(s): AMMONIA in the last 168 hours. Coagulation Profile: No results for input(s): INR, PROTIME in the last 168 hours. Cardiac Enzymes:  Recent Labs Lab 12/27/16 2105  TROPONINI <0.03   BNP (last 3 results) No results for input(s): PROBNP in the last 8760 hours. HbA1C: No results for input(s): HGBA1C in the last 72 hours. CBG: No results for input(s): GLUCAP in the last 168 hours. Lipid Profile: No results for input(s): CHOL, HDL, LDLCALC, TRIG, CHOLHDL, LDLDIRECT in the last 72 hours. Thyroid Function Tests: No results for input(s): TSH, T4TOTAL, FREET4, T3FREE, THYROIDAB in the last 72 hours. Anemia  Panel: No results for input(s): VITAMINB12, FOLATE, FERRITIN, TIBC, IRON, RETICCTPCT in the last 72 hours. Urine analysis:    Component Value Date/Time   COLORURINE YELLOW (A) 12/27/2016 2105   APPEARANCEUR CLEAR (A) 12/27/2016 2105   LABSPEC 1.012 12/27/2016 2105   PHURINE 5.0 12/27/2016 2105   GLUCOSEU NEGATIVE 12/27/2016 2105   HGBUR LARGE (A) 12/27/2016 2105   BILIRUBINUR NEGATIVE 12/27/2016 2105   KETONESUR NEGATIVE 12/27/2016 2105   PROTEINUR NEGATIVE 12/27/2016 2105   UROBILINOGEN 0.2 01/11/2015 2338   NITRITE NEGATIVE 12/27/2016 2105   LEUKOCYTESUR TRACE (A) 12/27/2016 2105   Sepsis Labs: @LABRCNTIP (procalcitonin:4,lacticidven:4) )No results found for this or any previous visit (from the past 240 hour(s)).   Radiological Exams on Admission: No  results found.  EKG: Normal sinus rhythm at 72 bpm with normal axis and nonspecific ST-T wave changes.   Assessment/Plan  This is a 83 y.o. female with a history of CHF, DM, HTN, CVA, PPM now being admitted with:  #. Generalized weakness, UTI - Admit inpatient - IV Rocephin - IVF hydration - Follow up cultures  #. History of hypertension -Continue amlodipine, benazepril, Coreg, HCTZ  #. History of hyperlipidemia -Continue Lipitor  #. History of afib - Continue Coreg, Eliquis  #. H/o Diabetes - Accuchecks achs with RISS coverage - Heart healthy, carb controlled diet - Hold Metformin   Admission status: Inpatient IV Fluids: NS Diet/Nutrition: HH, CC Consults called: None  DVT Px: Lovenox, SCDs and early ambulation. Code Status: Full Code  Disposition Plan: To be determined in 1-2 days  All the records are reviewed and case discussed with ED provider. Management plans discussed with the patient and/or family who express understanding and agree with plan of care.  Cortez Steelman D.O. on 12/27/2016 at 11:01 PM Between 7am to 6pm - Pager - [phone] After 6pm go to [email or web address] - Social research officer, government [facility] [place] [facility] Office [phone] CC: Primary care physician; Rafael Bihari, MD   12/27/2016, 11:01 PM

## 2016-12-27 NOTE — ED Triage Notes (Signed)
Pt to ED via EMS from home c/o generalized weakness today.  EMS states PCP wanted UA from patient and when attempting to collect at home patient unable to get off of toilet.  Patient found by EMS on toilet and there since 4pm today.  EMS vitals: 97.4 temp, 114 CBG, 80 paced HR, 165/98 BP, 96% RA.  Hx of TIA, HTN, DBM, pacemaker.  Pt presents A&O to self, disoriented to time and situation, chest rise even and unlabored.

## 2016-12-28 DIAGNOSIS — N390 Urinary tract infection, site not specified: Secondary | ICD-10-CM | POA: Diagnosis not present

## 2016-12-28 DIAGNOSIS — E119 Type 2 diabetes mellitus without complications: Secondary | ICD-10-CM | POA: Diagnosis not present

## 2016-12-28 DIAGNOSIS — E785 Hyperlipidemia, unspecified: Secondary | ICD-10-CM | POA: Diagnosis not present

## 2016-12-28 DIAGNOSIS — R531 Weakness: Secondary | ICD-10-CM | POA: Diagnosis not present

## 2016-12-28 LAB — BASIC METABOLIC PANEL
ANION GAP: 9 (ref 5–15)
BUN: 18 mg/dL (ref 6–20)
CALCIUM: 9 mg/dL (ref 8.9–10.3)
CO2: 25 mmol/L (ref 22–32)
Chloride: 105 mmol/L (ref 101–111)
Creatinine, Ser: 0.72 mg/dL (ref 0.44–1.00)
GFR calc Af Amer: >60 mL/min (ref >60)
GLUCOSE: 91 mg/dL (ref 65–99)
Potassium: 3.2 mmol/L — ABNORMAL LOW (ref 3.5–5.1)
Sodium: 139 mmol/L (ref 135–145)

## 2016-12-28 LAB — GLUCOSE, CAPILLARY
GLUCOSE-CAPILLARY: 100 mg/dL — AB (ref 65–99)
GLUCOSE-CAPILLARY: 79 mg/dL (ref 65–99)
Glucose-Capillary: 95 mg/dL (ref 65–99)
Glucose-Capillary: 129 mg/dL — ABNORMAL HIGH (ref 65–99)
Glucose-Capillary: 118 mg/dL — ABNORMAL HIGH (ref 65–99)

## 2016-12-28 LAB — CBC
HCT: 38.2 % (ref 35.0–47.0)
HEMOGLOBIN: 12.6 g/dL (ref 12.0–16.0)
MCH: 27.7 pg (ref 26.0–34.0)
MCHC: 33 g/dL (ref 32.0–36.0)
MCV: 84.1 fL (ref 80.0–100.0)
PLATELETS: 175 10*3/uL (ref 150–440)
RBC: 4.55 MIL/uL (ref 3.80–5.20)
RDW: 14.6 % — ABNORMAL HIGH (ref 11.5–14.5)
WBC: 3.6 10*3/uL (ref 3.6–11.0)

## 2016-12-28 MED ORDER — ACETAMINOPHEN 500 MG PO TABS: 500.0000 mg | ORAL_TABLET | ORAL | Status: DC | PRN

## 2016-12-28 MED ORDER — APIXABAN 5 MG PO TABS
5.0000 mg | ORAL_TABLET | Freq: Two times a day (BID) | ORAL | Status: DC
  Administered 2016-12-28: 5 mg via ORAL
  Filled 2016-12-28: qty 1

## 2016-12-28 MED ORDER — ATORVASTATIN CALCIUM 20 MG PO TABS
20.0000 mg | ORAL_TABLET | Freq: Every day | ORAL | Status: DC
  Administered 2016-12-28 – 2016-12-29 (×2): 20 mg via ORAL
  Filled 2016-12-28 (×2): qty 1

## 2016-12-28 MED ORDER — POTASSIUM CHLORIDE CRYS ER 10 MEQ PO TBCR
10.0000 meq | EXTENDED_RELEASE_TABLET | Freq: Every day | ORAL | Status: DC
Start: 2016-12-29 — End: 2016-12-30
  Administered 2016-12-29 – 2016-12-30 (×2): 10 meq via ORAL
  Filled 2016-12-28 (×2): qty 1

## 2016-12-28 MED ORDER — MAGNESIUM CITRATE PO SOLN
1.0000 | Freq: Once | ORAL | Status: DC | PRN
  Filled 2016-12-28: qty 296

## 2016-12-28 MED ORDER — CARVEDILOL 3.125 MG PO TABS
6.2500 mg | ORAL_TABLET | Freq: Two times a day (BID) | ORAL | Status: DC
  Administered 2016-12-28 – 2016-12-30 (×5): 6.25 mg via ORAL
  Filled 2016-12-28 (×5): qty 2

## 2016-12-28 MED ORDER — IPRATROPIUM BROMIDE 0.02 % IN SOLN: 0.5000 mg | Freq: Four times a day (QID) | RESPIRATORY_TRACT | Status: DC | PRN

## 2016-12-28 MED ORDER — LATANOPROST 0.005 % OP SOLN
1.0000 [drp] | Freq: Every day | OPHTHALMIC | Status: DC
  Administered 2016-12-28 – 2016-12-29 (×2): 1 [drp] via OPHTHALMIC
  Filled 2016-12-28: qty 2.5

## 2016-12-28 MED ORDER — INSULIN ASPART 100 UNIT/ML ~~LOC~~ SOLN: 0.0000 [IU] | Freq: Every day | SUBCUTANEOUS | Status: DC

## 2016-12-28 MED ORDER — CEFTRIAXONE SODIUM 1 G IJ SOLR: 1.0000 g | INTRAMUSCULAR | Status: DC

## 2016-12-28 MED ORDER — HYDROCHLOROTHIAZIDE 25 MG PO TABS
25.0000 mg | ORAL_TABLET | Freq: Every day | ORAL | Status: DC
  Administered 2016-12-28 – 2016-12-30 (×3): 25 mg via ORAL
  Filled 2016-12-28 (×3): qty 1

## 2016-12-28 MED ORDER — BISACODYL 5 MG PO TBEC: 5.0000 mg | DELAYED_RELEASE_TABLET | Freq: Every day | ORAL | Status: DC | PRN

## 2016-12-28 MED ORDER — INSULIN ASPART 100 UNIT/ML ~~LOC~~ SOLN
0.0000 [IU] | Freq: Three times a day (TID) | SUBCUTANEOUS | Status: DC
  Administered 2016-12-28 – 2016-12-29 (×2): 1 [IU] via SUBCUTANEOUS
  Filled 2016-12-28 (×2): qty 1

## 2016-12-28 MED ORDER — ONDANSETRON HCL 4 MG PO TABS: 4.0000 mg | ORAL_TABLET | Freq: Four times a day (QID) | ORAL | Status: DC | PRN

## 2016-12-28 MED ORDER — ONDANSETRON HCL 4 MG/2ML IJ SOLN: 4.0000 mg | Freq: Four times a day (QID) | INTRAMUSCULAR | Status: DC | PRN

## 2016-12-28 MED ORDER — SENNOSIDES-DOCUSATE SODIUM 8.6-50 MG PO TABS: 1.0000 | ORAL_TABLET | Freq: Every evening | ORAL | Status: DC | PRN

## 2016-12-28 MED ORDER — ADULT MULTIVITAMIN W/MINERALS CH
1.0000 | ORAL_TABLET | Freq: Every day | ORAL | Status: DC
  Administered 2016-12-28 – 2016-12-30 (×3): 1 via ORAL
  Filled 2016-12-28 (×3): qty 1

## 2016-12-28 MED ORDER — POTASSIUM CHLORIDE CRYS ER 20 MEQ PO TBCR
40.0000 meq | EXTENDED_RELEASE_TABLET | Freq: Once | ORAL | Status: AC
  Administered 2016-12-28: 40 meq via ORAL
  Filled 2016-12-28: qty 2

## 2016-12-28 MED ORDER — DEXTROSE 5 % IV SOLN
1.0000 g | INTRAVENOUS | Status: DC
  Administered 2016-12-28: 18:00:00 1 g via INTRAVENOUS
  Filled 2016-12-28 (×2): qty 10

## 2016-12-28 MED ORDER — OXYCODONE HCL 5 MG PO TABS: 5.0000 mg | ORAL_TABLET | ORAL | Status: DC | PRN

## 2016-12-28 MED ORDER — VITAMIN D 1000 UNITS PO TABS
2000.0000 [IU] | ORAL_TABLET | Freq: Every day | ORAL | Status: DC
  Administered 2016-12-28 – 2016-12-30 (×3): 2000 [IU] via ORAL
  Filled 2016-12-28 (×3): qty 2

## 2016-12-28 MED ORDER — POTASSIUM CHLORIDE CRYS ER 10 MEQ PO TBCR: 10.0000 meq | EXTENDED_RELEASE_TABLET | Freq: Every day | ORAL | Status: DC

## 2016-12-28 MED ORDER — APIXABAN 2.5 MG PO TABS
2.5000 mg | ORAL_TABLET | Freq: Two times a day (BID) | ORAL | Status: DC
  Administered 2016-12-28 – 2016-12-30 (×5): 2.5 mg via ORAL
  Filled 2016-12-28 (×5): qty 1

## 2016-12-28 MED ORDER — ALBUTEROL SULFATE (2.5 MG/3ML) 0.083% IN NEBU: 2.5000 mg | INHALATION_SOLUTION | Freq: Four times a day (QID) | RESPIRATORY_TRACT | Status: DC | PRN

## 2016-12-28 MED ORDER — SODIUM CHLORIDE 0.9 % IV SOLN
INTRAVENOUS | Status: DC
  Administered 2016-12-28 – 2016-12-29 (×3): via INTRAVENOUS

## 2016-12-28 NOTE — Discharge Instructions (Addendum)
[facility] - [place] at [facility]  DIET:  Diabetic diet, cardiac diet  DISCHARGE CONDITION:  Stable  ACTIVITY:  Activity as tolerated  OXYGEN:  Home Oxygen: No.   Oxygen Delivery: room air  DISCHARGE LOCATION:  home    ADDITIONAL DISCHARGE INSTRUCTION:   If you experience worsening of your admission symptoms, develop shortness of breath, life threatening emergency, suicidal or homicidal thoughts you must seek medical attention immediately by calling 911 or calling your MD immediately  if symptoms less severe.  You Must read complete instructions/literature along with all the possible adverse reactions/side effects for all the Medicines you take and that have been prescribed to you. Take any new Medicines after you have completely understood and accpet all the possible adverse reactions/side effects.   Please note  You were cared for by a hospitalist during your hospital stay. If you have any questions about your discharge medications or the care you received while you were in the hospital after you are discharged, you can call the unit and asked to speak with the hospitalist on call if the hospitalist that took care of you is not available. Once you are discharged, your primary care physician will handle any further medical issues. Please note that NO REFILLS for any discharge medications will be authorized once you are discharged, as it is imperative that you return to your primary care physician (or establish a relationship with a primary care physician if you do not have one) for your aftercare needs so that they can reassess your need for medications and monitor your lab values.      Your dose of Eliquis has been decreased to 2.5mg  twice daily  Information on my medicine - ELIQUIS (apixaban)  Why was Eliquis prescribed for you? Eliquis was prescribed for you to reduce the risk of a blood clot forming that can cause a stroke if you have a medical  condition called atrial fibrillation (a type of irregular heartbeat).  What do You need to know about Eliquis ? Take your Eliquis TWICE DAILY - one tablet in the morning and one tablet in the evening with or without food. If you have difficulty swallowing the tablet whole please discuss with your pharmacist how to take the medication safely.  Take Eliquis exactly as prescribed by your doctor and DO NOT stop taking Eliquis without talking to the doctor who prescribed the medication.  Stopping may increase your risk of developing a stroke.  Refill your prescription before you run out.  After discharge, you should have regular check-up appointments with your healthcare provider that is prescribing your Eliquis.  In the future your dose may need to be changed if your kidney function or weight changes by a significant amount or as you get older.  What do you do if you miss a dose? If you miss a dose, take it as soon as you remember on the same day and resume taking twice daily.  Do not take more than one dose of ELIQUIS at the same time to make up a missed dose.  Important Safety Information A possible side effect of Eliquis is bleeding. You should call your healthcare provider right away if you experience any of the following: ? Bleeding from an injury or your nose that does not stop. ? Unusual colored urine (red or dark brown) or unusual colored stools (red or black). ? Unusual bruising for unknown reasons. ? A serious fall or if you hit your head (even if  there is no bleeding).  Some medicines may interact with Eliquis and might increase your risk of bleeding or clotting while on Eliquis. To help avoid this, consult your healthcare provider or pharmacist prior to using any new prescription or non-prescription medications, including herbals, vitamins, non-steroidal anti-inflammatory drugs (NSAIDs) and supplements.  This website has more information on Eliquis (apixaban):  http://[email or web address]/[email or web address]

## 2016-12-28 NOTE — Evaluation (Addendum)
Physical Therapy Evaluation Patient Details Name: Tamara Campbell C Tetrault MRN: [id number] DOB: 05/22/1931 Today's Date: 12/28/2016   History of Present Illness  presented to ER secondary to generalized weakness; admitted for UTI  Clinical Impression  Upon evaluation, patient alert and oriented to self only.  Pleasant and cooperative, follows simple one-step commands.  Bilat UE/LE strength generally weak and deconditioned, grossly 4-/5 throughout.  Currently requiring mod assist for bed mobility, sit/stand with RW; min/mod assist for gait (5' x1, 15' x1) with RW.  Constant hands-on assist to prevent posterior LOB. Unsafe to attempt without RW and +1 assist at all times. Would benefit from skilled PT to address above deficits and promote optimal return to PLOF; recommend transition to STR upon discharge from acute hospitalization.     Follow Up Recommendations SNF    Equipment Recommendations       Recommendations for Other Services       Precautions / Restrictions Precautions Precautions: Fall Restrictions Weight Bearing Restrictions: No      Mobility  Bed Mobility Overal bed mobility: Needs Assistance Bed Mobility: Supine to Sit     Supine to sit: Mod assist     General bed mobility comments: hand-over-hand for UE placement on bedrails; assist for LE management and truncal elevation  Transfers Overall transfer level: Needs assistance Equipment used: Rolling [place] (2 wheeled) Transfers: Sit to/from Stand Sit to Stand: Mod assist         General transfer comment: assist for mechanics, weight shift, lift off and standing balance  Ambulation/Gait Ambulation/Gait assistance: Mod assist;Min assist Ambulation Distance (Feet): 5 Feet (15 x1) Assistive device: Rolling Masci (2 wheeled)       General Gait Details: decreased step height/length, decreased cadence and gait speed.  Poor balance reactions, constant hands-on assist for standing balance and fall prevention  Stairs             Wheelchair Mobility    Modified Rankin (Stroke Patients Only)       Balance Overall balance assessment: Needs assistance Sitting-balance support: No upper extremity supported;Feet supported Sitting balance-Leahy Scale: Poor Sitting balance - Comments: posterior lean/LOB with dynamic activities and cognitive distraction   Standing balance support: Bilateral upper extremity supported Standing balance-Leahy Scale: Poor  Absent functional reach, indicative of increased fall risk                               Pertinent Vitals/Pain Pain Assessment: No/denies pain    Home Living Family/patient expects to be discharged to:: Private residence Living Arrangements: Children Available Help at Discharge: Family Type of Home: House Home Access: Stairs to enter   Entergy CorporationEntrance Stairs-Number of Steps: Pt unsure of number of stairs Home Layout: One level Home Equipment: Environmental consultantWalker - 2 wheels;Bedside commode;Shower seat - built in;Grab bars - tub/shower Additional Comments: Above information taken from previous documenation/records.  Patient confused, poor historian; unable to provide accurate information.  Will verify with family as available.    Prior Function           Comments: Per notes, has 4WRW and SPC in the home; family assists with ADLs as needed.  Will verify additional information with family as available.     Hand Dominance        Extremity/Trunk Assessment   Upper Extremity Assessment Upper Extremity Assessment: Generalized weakness    Lower Extremity Assessment Lower Extremity Assessment: Generalized weakness (grossly 4-5 throughout)       Communication  Cognition Arousal/Alertness: Awake/alert Behavior During Therapy: WFL for tasks assessed/performed Overall Cognitive Status: No family/caregiver present to determine baseline cognitive functioning                                 General Comments: oriented to self only;  follows simple, one-step commands, but requires increased time for processing.  Poor awareness of deficits and safety needs      General Comments      Exercises     Assessment/Plan    PT Assessment Patient needs continued PT services  PT Problem List Decreased strength;Decreased activity tolerance;Decreased balance;Decreased mobility;Decreased coordination;Decreased cognition;Decreased knowledge of use of DME;Decreased safety awareness;Decreased knowledge of precautions       PT Treatment Interventions DME instruction;Gait training;Functional mobility training;Stair training;Therapeutic exercise;Therapeutic activities;Balance training;Cognitive remediation;Patient/family education    PT Goals (Current goals can be found in the Care Plan section)  Acute Rehab PT Goals PT Goal Formulation: Patient unable to participate in goal setting Time For Goal Achievement: 01/11/17 Potential to Achieve Goals: Fair    Frequency Min 2X/week   Barriers to discharge Decreased caregiver support      Co-evaluation               AM-PAC PT "6 Clicks" Daily Activity  Outcome Measure Difficulty turning over in bed (including adjusting bedclothes, sheets and blankets)?: Total Difficulty moving from lying on back to sitting on the side of the bed? : Total Difficulty sitting down on and standing up from a chair with arms (e.g., wheelchair, bedside commode, etc,.)?: Total Help needed moving to and from a bed to chair (including a wheelchair)?: A Little Help needed walking in hospital room?: A Little Help needed climbing 3-5 steps with a railing? : A Lot 6 Click Score: 11    End of Session   Activity Tolerance: Patient tolerated treatment well Patient left: in chair;with chair alarm set;with call bell/phone within reach Nurse Communication: Mobility status PT Visit Diagnosis: Difficulty in walking, not elsewhere classified (R26.2);Muscle weakness (generalized) (M62.81)    Time:  1019-1040 PT Time Calculation (min) (ACUTE ONLY): 21 min   Charges:   PT Evaluation $PT Eval Low Complexity: 1 Procedure PT Treatments $Therapeutic Activity: 8-22 mins   PT G Codes:   PT G-Codes **NOT FOR INPATIENT CLASS** Functional Assessment Tool Used: AM-PAC 6 Clicks Basic Mobility Functional Limitation: Mobility: Walking and moving around Mobility: Walking and Moving Around Current Status ([id number]([id number]): At least 40 percent but less than 60 percent impaired, limited or restricted Mobility: Walking and Moving Around Goal Status [phone](G8979): At least 1 percent but less than 20 percent impaired, limited or restricted    Belinda Bringhurst H. Manson PasseyBrown, PT, DPT, NCS 12/28/16, 12:22 PM [phone]

## 2016-12-28 NOTE — [place] FL2 (Signed)
[place] MEDICAID FL2 LEVEL OF CARE SCREENING TOOL     IDENTIFICATION  Patient Name: Tamara Campbell Birthdate: 09/07/1930 Sex: female Admission Date (Current Location): 12/27/2016  [place]   Facility and Address:  [facility], [address], [place], [place] [address]      Provider Number: [id number]  Attending Physician Name and Address:  Enedina FinnerPatel, Sona, MD  Relative Name and Phone Number:       Current Level of Care: Hospital Recommended Level of Care: Skilled Nursing Facility Prior Approval Number:    Date Approved/Denied:   PASRR Number: [id number]  Discharge Plan: SNF    Current Diagnoses: Patient Active Problem List   Diagnosis Date Noted  . UTI (urinary tract infection) 12/27/2016  . Acute encephalopathy 04/26/2016  . Embolic stroke (HCC) 03/16/2015  . Risk for falls 03/16/2015  . Essential hypertension 01/13/2015  . Hyperlipidemia LDL goal <70 01/13/2015  . Diabetes type 2, controlled (HCC) 01/13/2015  . Paroxysmal ventricular tachycardia (HCC) 01/13/2015  . Hypokalemia 01/13/2015  . Open wound of left lower leg 01/13/2015  . Cerebral infarction due to embolism of middle cerebral artery (HCC)   . Atrial fibrillation (HCC) 01/10/2015  . Cardiomyopathy (HCC) 01/10/2015  . Cerebral infarction (HCC) 01/08/2015    Orientation RESPIRATION BLADDER Height & Weight     Self  Normal Incontinent Weight: 128 lb 3.2 oz (58.2 kg) Height:  5\' 2"  (157.5 cm)  BEHAVIORAL SYMPTOMS/MOOD NEUROLOGICAL BOWEL NUTRITION STATUS      Incontinent Diet (Heart Healthy/Carb Modified, Thin Liquids)  AMBULATORY STATUS COMMUNICATION OF NEEDS Skin   Limited Assist Verbally Normal                       Personal Care Assistance Level of Assistance  Bathing, Feeding, Dressing Bathing Assistance: Limited assistance Feeding assistance: Independent Dressing Assistance: Limited assistance     Functional Limitations Info   Sight, Hearing, Speech Sight Info: Adequate Hearing Info: Adequate Speech Info: Adequate    SPECIAL CARE FACTORS FREQUENCY  PT (By licensed PT)     PT Frequency: 5              Contractures Contractures Info: Not present    Additional Factors Info  Code Status, Allergies Code Status Info: Full Code Allergies Info: No know allergies           Current Medications (12/28/2016):  This is the current hospital active medication list Current Facility-Administered Medications  Medication Dose Route Frequency Provider Last Rate Last Dose  . 0.9 %  sodium chloride infusion   Intravenous Continuous Hugelmeyer, Alexis, DO 75 mL/hr at 12/28/16 0222    . acetaminophen (TYLENOL) tablet 500 mg  500 mg Oral Q4H PRN Hugelmeyer, Alexis, DO      . albuterol (PROVENTIL) (2.5 MG/3ML) 0.083% nebulizer solution 2.5 mg  2.5 mg Nebulization Q6H PRN Hugelmeyer, Alexis, DO      . apixaban (ELIQUIS) tablet 2.5 mg  2.5 mg Oral BID Hugelmeyer, Alexis, DO   2.5 mg at 12/28/16 1031  . atorvastatin (LIPITOR) tablet 20 mg  20 mg Oral q1800 Hugelmeyer, Alexis, DO      . bisacodyl (DULCOLAX) EC tablet 5 mg  5 mg Oral Daily PRN Hugelmeyer, Alexis, DO      . carvedilol (COREG) tablet 6.25 mg  6.25 mg Oral BID WC Hugelmeyer, Alexis, DO   6.25 mg at 12/28/16 1031  . cefTRIAXone (ROCEPHIN) 1 g in dextrose 5 % 50  mL IVPB  1 g Intravenous Q24H Hugelmeyer, Alexis, DO      . cholecalciferol (VITAMIN D) tablet 2,000 Units  2,000 Units Oral Daily Hugelmeyer, Alexis, DO   2,000 Units at 12/28/16 1031  . hydrochlorothiazide (HYDRODIURIL) tablet 25 mg  25 mg Oral Daily Hugelmeyer, Alexis, DO   25 mg at 12/28/16 1030  . insulin aspart (novoLOG) injection 0-5 Units  0-5 Units Subcutaneous QHS Hugelmeyer, Alexis, DO      . insulin aspart (novoLOG) injection 0-9 Units  0-9 Units Subcutaneous TID WC Hugelmeyer, Alexis, DO      . ipratropium (ATROVENT) nebulizer solution 0.5 mg  0.5 mg Nebulization Q6H PRN Hugelmeyer, Alexis, DO       . latanoprost (XALATAN) 0.005 % ophthalmic solution 1 drop  1 drop Both Eyes QHS Hugelmeyer, Alexis, DO      . magnesium citrate solution 1 Bottle  1 Bottle Oral Once PRN Hugelmeyer, Alexis, DO      . multivitamin with minerals tablet 1 tablet  1 tablet Oral Daily Hugelmeyer, Alexis, DO   1 tablet at 12/28/16 1031  . ondansetron (ZOFRAN) tablet 4 mg  4 mg Oral Q6H PRN Hugelmeyer, Alexis, DO       Or  . ondansetron (ZOFRAN) injection 4 mg  4 mg Intravenous Q6H PRN Hugelmeyer, Alexis, DO      . oxyCODONE (Oxy IR/ROXICODONE) immediate release tablet 5 mg  5 mg Oral Q4H PRN Hugelmeyer, Alexis, DO      . [START ON 12/29/2016] potassium chloride (K-DUR,KLOR-CON) CR tablet 10 mEq  10 mEq Oral Daily Enedina FinnerPatel, Sona, MD      . senna-docusate (Senokot-S) tablet 1 tablet  1 tablet Oral QHS PRN Hugelmeyer, Alexis, DO         Discharge Medications: Please see discharge summary for a list of discharge medications.  Relevant Imaging Results:  Relevant Lab Results:   Additional Information SSN:  [id number]  Dede QuerySarah Angellynn Kimberlin, LCSW

## 2016-12-28 NOTE — Progress Notes (Signed)
High risk band on patient.

## 2016-12-28 NOTE — Progress Notes (Signed)
[facility] [place] [facility]   PATIENT NAME: Tamara Campbell    MR#:  [id number]  DATE OF BIRTH:  09/14/1930  SUBJECTIVE:  Came from home with lethargy and found to UIT/hematuria Out int he chair today Answering some questions appropriately  REVIEW OF SYSTEMS:   Review of Systems  Unable to perform ROS: Medical condition   Tolerating Diet:some Tolerating PT: SNF  DRUG ALLERGIES:  No Known Allergies  VITALS:  Blood pressure (!) 146/88, pulse 73, temperature 97.6 F (36.4 C), temperature source Oral, resp. rate 17, height 5\' 2"  (1.575 m), weight 58.2 kg (128 lb 3.2 oz), SpO2 99 %.  PHYSICAL EXAMINATION:   Physical Exam  GENERAL:  83 y.o.-year-old patient lying in the bed with no acute distress. Thin, weak EYES: Pupils equal, round, reactive to light and accommodation. No scleral icterus. Extraocular muscles intact.  HEENT: Head atraumatic, normocephalic. Oropharynx and nasopharynx clear.  NECK:  Supple, no jugular venous distention. No thyroid enlargement, no tenderness.  LUNGS: Normal breath sounds bilaterally, no wheezing, rales, rhonchi. No use of accessory muscles of respiration.  CARDIOVASCULAR: S1, S2 normal. No murmurs, rubs, or gallops.  ABDOMEN: Soft, nontender, nondistended. Bowel sounds present. No organomegaly or mass.  EXTREMITIES: No cyanosis, clubbing or edema b/l.    NEUROLOGIC: limited. No focal Motor or sensory deficits b/l.   PSYCHIATRIC:  patient is alert on VC but very sleepy.  SKIN: No obvious rash, lesion, or ulcer.   LABORATORY PANEL:  CBC  Recent Labs Lab 12/28/16 0454  WBC 3.6  HGB 12.6  HCT 38.2  PLT 175    Chemistries   Recent Labs Lab 12/27/16 2105 12/28/16 0454  NA 140 139  K 3.6 3.2*  CL 105 105  CO2 25 25  GLUCOSE 99 91  BUN 21* 18  CREATININE 1.02* 0.72  CALCIUM 9.7 9.0  AST 22  --   ALT 15  --   ALKPHOS 90  --   BILITOT 0.8  --    Cardiac Enzymes  Recent Labs Lab 12/27/16 2105   TROPONINI <0.03   RADIOLOGY:  Dg Chest 2 View  Result Date: 12/27/2016 CLINICAL DATA:  Generalized weakness EXAM: CHEST  2 VIEW COMPARISON:  08/01/2016 FINDINGS: Left-sided single lead pacing device. Moderate severe cardiomegaly as before. Tortuous calcified aorta. No pleural effusion. No focal consolidation. No pneumothorax. IMPRESSION: Stable cardiomegaly.  No edema or infiltrate. Electronically Signed   By: Jasmine PangKim  Fujinaga M.D.   On: 12/27/2016 23:25   Ct Head Wo Contrast  Result Date: 12/27/2016 CLINICAL DATA:  83 y/o  F; weakness. EXAM: CT HEAD WITHOUT CONTRAST TECHNIQUE: Contiguous axial images were obtained from the base of the skull through the vertex without intravenous contrast. COMPARISON:  04/25/2016 CT of the head. FINDINGS: Brain: No evidence of acute infarction, hemorrhage, hydrocephalus, extra-axial collection or mass lesion/mass effect. Stable advanced chronic microvascular ischemic changes and moderate parenchymal volume loss of the brain. Bilateral thalamus, right frontal cortical, and right inferior cerebellar chronic infarctions. Vascular: Extensive calcific atherosclerosis of cavernous internal carotid arteries. Skull: Normal. Negative for fracture or focal lesion. Sinuses/Orbits: No acute finding. Other: None. IMPRESSION: 1. No acute intracranial abnormality identified. 2. Stable advanced chronic microvascular ischemic changes, moderate parenchymal volume loss, and chronic infarcts of the brain. Electronically Signed   By: Mitzi HansenLance  Furusawa-Stratton M.D.   On: 12/27/2016 23:26   ASSESSMENT AND PLAN:  Tamara Campbell is a 83 y.o. female with a known history of CHF, DM, HTN, CVA, PPM  presents to the emergency department for evaluation of weakness.  Patient was in a usual state of health until this afternoon she sat on the toilet but was took weak to stand back up.  EMS found her about 5 hours later.  She reports symptoms of generalized weakness, decreased by mouth intake, decreased  mobility.  #. Generalized weakness, UTI with hematuria - IV Rocephin - IVF hydration - Follow up cultures -if hemturia does not clear recommended dter to seek Urology opinion as outpt #. History of hypertension -Continue amlodipine, benazepril, Coreg, HCTZ  #. History of hyperlipidemia -Continue Lipitor  #. History of afib - Continue Coreg, Eliquis  #. H/o Diabetes - Accuchecks achs with RISS coverage - Heart healthy, carb controlled diet - Hold Metformin---due to poor po intake and sugars stable  Case discussed with Care Management/Social Worker. Management plans discussed with the patient, family and they are in agreement.  CODE STATUS: FULL DVT Prophylaxis: Eliquis  TOTAL TIME TAKING CARE OF THIS PATIENT: *30* minutes.  >50% time spent on counselling and coordination of care dter [name] on the phone  POSSIBLE D/C IN 1-2 DAYS, DEPENDING ON CLINICAL CONDITION.  Note: This dictation was prepared with Dragon dictation along with smaller phrase technology. Any transcriptional errors that result from this process are unintentional.  Star Cheese M.D on 12/28/2016 at 5:05 PM  Between 7am to 6pm - Pager - [phone]  After 6pm go to [email or web address] - password [facility] [email or web address]  [facility] [place] [place] [facility]  Office  [phone]  CC: Primary care physician; Rafael BihariWalker, John B III, MD

## 2016-12-28 NOTE — Progress Notes (Signed)
Eliquis changed to 2.5 mg BID for age 81>80 y/o and TBW <60 kg per MED 710-02.

## 2016-12-28 NOTE — Care Management Note (Signed)
Case Management Note  Patient Details  Name: Tamara Campbell MRN: [id number] Date of Birth: 01/31/1931  Subjective/Objective:   Ad[facility] with the diagnosis of urinary tract infection. Son [name] lives in the home [phone]). Daughter is [name] [phone]). [facility] [address]. [facility] for Home Health services 04/2016. [facility] 2016. No home oxygen. [place] and rolling [place] in the home. Family helps with baths and dressing. States no falls. Direct questions are answered with "I can't remember."                  Action/Plan: Physical therapy evaluation pending   Expected Discharge Date:                  Expected Discharge Plan:     In-House Referral:     Discharge planning Services     Post Acute Care Choice:    Choice offered to:     DME Arranged:    DME Agency:     HH Arranged:    HH Agency:     Status of Service:     If discussed at [employer] Length of Stay Meetings, dates discussed:    Additional Comments:  Gwenette GreetBrenda S Chanc Kervin, RN MSN CCM Care Management [phone] 12/28/2016, 9:02 AM

## 2016-12-29 ENCOUNTER — Encounter
Admission: RE | Admit: 2016-12-29 | Discharge: 2016-12-29 | Disposition: A | Discharge status: Home / Self Care | Payer: Medicare HMO | Source: Ambulatory Visit | Attending: Internal Medicine | Admitting: Internal Medicine

## 2016-12-29 DIAGNOSIS — R531 Weakness: Secondary | ICD-10-CM

## 2016-12-29 DIAGNOSIS — E785 Hyperlipidemia, unspecified: Secondary | ICD-10-CM | POA: Diagnosis not present

## 2016-12-29 DIAGNOSIS — E119 Type 2 diabetes mellitus without complications: Secondary | ICD-10-CM | POA: Diagnosis not present

## 2016-12-29 DIAGNOSIS — N390 Urinary tract infection, site not specified: Secondary | ICD-10-CM | POA: Diagnosis not present

## 2016-12-29 LAB — BASIC METABOLIC PANEL
ANION GAP: 8 (ref 5–15)
BUN: 14 mg/dL (ref 6–20)
CALCIUM: 8.9 mg/dL (ref 8.9–10.3)
CO2: 25 mmol/L (ref 22–32)
Chloride: 105 mmol/L (ref 101–111)
Creatinine, Ser: 0.93 mg/dL (ref 0.44–1.00)
GFR calc Af Amer: >60 mL/min (ref >60)
GFR, EST NON AFRICAN AMERICAN: 55 mL/min — AB (ref >60)
GLUCOSE: 98 mg/dL (ref 65–99)
POTASSIUM: 3.6 mmol/L (ref 3.5–5.1)
SODIUM: 138 mmol/L (ref 135–145)

## 2016-12-29 LAB — URINE CULTURE: CULTURE: NO GROWTH

## 2016-12-29 LAB — GLUCOSE, CAPILLARY
GLUCOSE-CAPILLARY: 79 mg/dL (ref 65–99)
GLUCOSE-CAPILLARY: 127 mg/dL — AB (ref 65–99)
Glucose-Capillary: 104 mg/dL — ABNORMAL HIGH (ref 65–99)
Glucose-Capillary: 128 mg/dL — ABNORMAL HIGH (ref 65–99)

## 2016-12-29 MED ORDER — CEFUROXIME AXETIL 500 MG PO TABS
500.0000 mg | ORAL_TABLET | Freq: Two times a day (BID) | ORAL | Status: DC
  Administered 2016-12-29 – 2016-12-30 (×2): 500 mg via ORAL
  Filled 2016-12-29 (×3): qty 1

## 2016-12-29 NOTE — Care Management Obs Status (Deleted)
MEDICARE OBSERVATION STATUS NOTIFICATION   Patient Details  Name: Tamara Campbell MRN: [id number] Date of Birth: 07/24/1930   Medicare Observation Status Notification Given:  Yes    Gwenette GreetBrenda S Adasyn Mcadams, RN 12/29/2016, 8:35 AM

## 2016-12-29 NOTE — Clinical Social Work Note (Signed)
CSW spoke with pt's daughter to provide bed offers. Pt's daughter chose [facility]. CSW updated facility, and confirmed bed for potentially tomorrow. Facility will initiate [employer] auth. CSW updated MD. Pt;s daughter is aware and agreeable. CSW will continue to follow.   Dede QuerySarah Trea Latner, MSW, LCSW  Clinical Social Worker  [phone]

## 2016-12-29 NOTE — Care Management CC44 (Signed)
Condition Code 44 Documentation Completed  Patient Details  Name: Tamara Campbell MRN: [id number] Date of Birth: 05/15/1931   Condition Code 44 given:  Yes Patient signature on Condition Code 44 notice:  Yes Documentation of 2 MD's agreement:  Yes Code 44 added to claim:  Yes    Allyn KennerShannon Hanaa Payes, RN 12/29/2016, 9:34 AM

## 2016-12-29 NOTE — Clinical Social Work Note (Signed)
Clinical Social Work Assessment  Patient Details  Name: Tamara Campbell MRN: [id number] Date of Birth: 02-09-1931  Date of referral:  12/29/16               Reason for consult:  Facility Placement                Permission sought to share information with:  Family Supports Permission granted to share information::  Yes, Verbal Permission Granted  Name::     [name]  Relationship::  daughter and POA  Contact Information:  [phone]  Housing/Transportation Living arrangements for the past 2 months:  [place] of Information:  Adult Children Patient Interpreter Needed:  None Criminal Activity/Legal Involvement Pertinent to Current Situation/Hospitalization:  No - Comment as needed Significant Relationships:  Adult Children Lives with:  Adult Children Do you feel safe going back to the place where you live?  Yes Need for family participation in patient care:  Yes (Comment)  Care giving concerns:  No care giving concerns identified.   Social Worker assessment / plan:  CSW met with pt and daughter Henrietta Dine) to address consult for New SNF as recommended by PT. CSW introduced herself and explained role of social work. CSW also explained the process of discharging to SNF with [facility]. Pt lives with son, however daughter is Art therapist. Pt is oriented to self only. Pt's daughter is agreeable to SNF placement. CSW intiatied SNF search and will follow up with bed offers. Then [facility] St[facility] will be started. MD is aware. CSW is signing off as no further needs identified.   Employment status:  Retired Nurse, adult PT Recommendations:  [place] / Referral to community resources:  [place]  Patient/Family's Response to care:  Pt's daugher was appreciative of CSW support.   Patient/Family's Understanding of and Emotional Response to Diagnosis, Current Treatment, and Prognosis:  Pt's daughter  understands that pt would benefit from STR prior to returning home.   Emotional Assessment Appearance:  Appears stated age Attitude/Demeanor/Rapport:   (Appropriate) Affect (typically observed):  Other Orientation:  Oriented to Self Alcohol / Substance use:  Not Applicable Psych involvement (Current and /or in the community):  No (Comment)  Discharge Needs  Concerns to be addressed:  Adjustment to Illness Readmission within the last 30 days:  No Current discharge risk:  Chronically ill Barriers to Discharge:  Continued Medical Work up   [employer], LCSW 12/29/2016, 10:34 AM

## 2016-12-29 NOTE — Progress Notes (Signed)
[facility] [place] [facility]   PATIENT NAME: Tamara Campbell    MR#:  [id number]  DATE OF BIRTH:  07/14/1930  SUBJECTIVE:  Patient doing better more awake able to answer some questions  REVIEW OF SYSTEMS:   Review of Systems  Unable to perform ROS: Other   Tolerating Diet:some Tolerating PT: SNF  DRUG ALLERGIES:  No Known Allergies  VITALS:  Blood pressure (!) 158/85, pulse 73, temperature (!) 97.4 F (36.3 C), temperature source Oral, resp. rate 16, height 5\' 2"  (1.575 m), weight 128 lb 3.2 oz (58.2 kg), SpO2 96 %.  PHYSICAL EXAMINATION:   Physical Exam  GENERAL:  83 y.o.-year-old patient lying in the bed with no acute distress. Thin, weak EYES: Pupils equal, round, reactive to light and accommodation. No scleral icterus. Extraocular muscles intact.  HEENT: Head atraumatic, normocephalic. Oropharynx and nasopharynx clear.  NECK:  Supple, no jugular venous distention. No thyroid enlargement, no tenderness.  LUNGS: Normal breath sounds bilaterally, no wheezing, rales, rhonchi. No use of accessory muscles of respiration.  CARDIOVASCULAR: S1, S2 normal. No murmurs, rubs, or gallops.  ABDOMEN: Soft, nontender, nondistended. Bowel sounds present. No organomegaly or mass.  EXTREMITIES: No cyanosis, clubbing or edema b/l.    NEUROLOGIC: limited. No focal Motor or sensory deficits b/l.   PSYCHIATRIC:  patient is alert Not oriented to place but oriented to person SKIN: No obvious rash, lesion, or ulcer.   LABORATORY PANEL:  CBC  Recent Labs Lab 12/28/16 0454  WBC 3.6  HGB 12.6  HCT 38.2  PLT 175    Chemistries   Recent Labs Lab 12/27/16 2105  12/29/16 0322  NA 140  < > 138  K 3.6  < > 3.6  CL 105  < > 105  CO2 25  < > 25  GLUCOSE 99  < > 98  BUN 21*  < > 14  CREATININE 1.02*  < > 0.93  CALCIUM 9.7  < > 8.9  AST 22  --   --   ALT 15  --   --   ALKPHOS 90  --   --   BILITOT 0.8  --   --   < > = values in this interval not  displayed. Cardiac Enzymes  Recent Labs Lab 12/27/16 2105  TROPONINI <0.03   RADIOLOGY:  Dg Chest 2 View  Result Date: 12/27/2016 CLINICAL DATA:  Generalized weakness EXAM: CHEST  2 VIEW COMPARISON:  08/01/2016 FINDINGS: Left-sided single lead pacing device. Moderate severe cardiomegaly as before. Tortuous calcified aorta. No pleural effusion. No focal consolidation. No pneumothorax. IMPRESSION: Stable cardiomegaly.  No edema or infiltrate. Electronically Signed   By: Jasmine PangKim  Fujinaga M.D.   On: 12/27/2016 23:25   Ct Head Wo Contrast  Result Date: 12/27/2016 CLINICAL DATA:  83 y/o  F; weakness. EXAM: CT HEAD WITHOUT CONTRAST TECHNIQUE: Contiguous axial images were obtained from the base of the skull through the vertex without intravenous contrast. COMPARISON:  04/25/2016 CT of the head. FINDINGS: Brain: No evidence of acute infarction, hemorrhage, hydrocephalus, extra-axial collection or mass lesion/mass effect. Stable advanced chronic microvascular ischemic changes and moderate parenchymal volume loss of the brain. Bilateral thalamus, right frontal cortical, and right inferior cerebellar chronic infarctions. Vascular: Extensive calcific atherosclerosis of cavernous internal carotid arteries. Skull: Normal. Negative for fracture or focal lesion. Sinuses/Orbits: No acute finding. Other: None. IMPRESSION: 1. No acute intracranial abnormality identified. 2. Stable advanced chronic microvascular ischemic changes, moderate parenchymal volume loss, and chronic infarcts of  the brain. Electronically Signed   By: Mitzi HansenLance  Furusawa-Stratton M.D.   On: 12/27/2016 23:26   ASSESSMENT AND PLAN:  Tamara Campbell is a 83 y.o. female with a known history of CHF, DM, HTN, CVA, PPM presents to the emergency department for evaluation of weakness.  Patient was in a usual state of health until this afternoon she sat on the toilet but was took weak to stand back up.  EMS found her about 5 hours later.  She reports symptoms of  generalized weakness, decreased by mouth intake, decreased mobility.  #. Generalized weakness, UTI with hematuria -Oral Ceftin - IVF hydration - No growth in the urine -if hemturia does not clear recommended dter to seek Urology opinion as outpt  #. History of hypertension -Continue amlodipine, benazepril, Coreg, HCTZ  #. History of hyperlipidemia -Continue Lipitor  #. History of afib - Continue Coreg, Eliquis  #. H/o Diabetes - Accuchecks achs with RISS coverage - Heart healthy, carb controlled diet - Hold Metformin---due to poor po intake and sugars stable  Case discussed with Care Management/Social Worker. Management plans discussed with the patient, family and they are in agreement.  CODE STATUS: FULL DVT Prophylaxis: Eliquis  TOTAL TIME TAKING CARE OF THIS PATIENT: 25 minutes.  >50% time spent on counselling and coordination of care dter [name] on the phone  Discharge tomorrow awaiting insurance authorization.  Note: This dictation was prepared with Dragon dictation along with smaller phrase technology. Any transcriptional errors that result from this process are unintentional.  Auburn BilberryPATEL, Sindhu Nguyen M.D on 12/29/2016 at 12:52 PM  Between 7am to 6pm - Pager - [phone]  After 6pm go to [email or web address] - password [facility] [email or web address]  [facility] [place] [facility]  Office  [phone]  CC: Primary care physician; Rafael BihariWalker, John B III, MD

## 2016-12-29 NOTE — Care Management Obs Status (Signed)
MEDICARE OBSERVATION STATUS NOTIFICATION   Patient Details  Name: Tamara Campbell MRN: [id number] Date of Birth: 08/27/1930   Medicare Observation Status Notification Given:  Yes    Gwenette GreetBrenda S Issa Kosmicki, RN 12/29/2016, 8:36 AM

## 2016-12-29 NOTE — Care Management CC44 (Signed)
Condition Code 44 Documentation Completed  Patient Details  Name: Pamelia Hoitnnie C Harter MRN: [id number] Date of Birth: 03/24/1931   Condition Code 44 given:  Yes Patient signature on Condition Code 44 notice:  Yes Documentation of 2 MD's agreement:    Code 44 added to claim:       Gwenette GreetBrenda S Yunique Dearcos, RN 12/29/2016, 8:38 AM

## 2016-12-29 NOTE — Clinical Social Work Placement (Signed)
   CLINICAL SOCIAL WORK PLACEMENT  NOTE  Date:  12/29/2016  Patient Details  Name: Tamara Campbell MRN: [id number] Date of Birth: 07/21/1930  Clinical Social Work is seeking post-discharge placement for this patient at the Skilled  Nursing Facility level of care (*CSW will initial, date and re-position this form in  chart as items are completed):  Yes   Patient/family provided with [facility] Clinical Social Work Department's list of facilities offering this level of care within the geographic area requested by the patient (or if unable, by the patient's family).  Yes   Patient/family informed of their freedom to choose among providers that offer the needed level of care, that participate in Medicare, Medicaid or managed care program needed by the patient, have an available bed and are willing to accept the patient.  Yes   Patient/family informed of [facility]'s ownership interest in [facility] and [facility], as well as of the fact that they are under no obligation to receive care at these facilities.  PASRR submitted to EDS on       PASRR number received on       Existing PASRR number confirmed on 12/28/16     FL2 transmitted to all facilities in geographic area requested by pt/family on 12/29/16     FL2 transmitted to all facilities within larger geographic area on       Patient informed that his/her managed care company has contracts with or will negotiate with certain facilities, including the following:            Patient/family informed of bed offers received.  Patient chooses bed at       Physician recommends and patient chooses bed at      Patient to be transferred to   on  .  Patient to be transferred to facility by       Patient family notified on   of transfer.  Name of family member notified:        PHYSICIAN       Additional Comment:    _______________________________________________ Dede QuerySarah Zuly Belkin, LCSW 12/29/2016, 10:25 AM

## 2016-12-30 DIAGNOSIS — E1142 Type 2 diabetes mellitus with diabetic polyneuropathy: Secondary | ICD-10-CM | POA: Diagnosis not present

## 2016-12-30 DIAGNOSIS — I509 Heart failure, unspecified: Secondary | ICD-10-CM | POA: Diagnosis not present

## 2016-12-30 DIAGNOSIS — I4891 Unspecified atrial fibrillation: Secondary | ICD-10-CM | POA: Diagnosis not present

## 2016-12-30 DIAGNOSIS — E785 Hyperlipidemia, unspecified: Secondary | ICD-10-CM | POA: Diagnosis not present

## 2016-12-30 DIAGNOSIS — R262 Difficulty in walking, not elsewhere classified: Secondary | ICD-10-CM | POA: Diagnosis not present

## 2016-12-30 DIAGNOSIS — R41841 Cognitive communication deficit: Secondary | ICD-10-CM | POA: Diagnosis not present

## 2016-12-30 DIAGNOSIS — E441 Mild protein-calorie malnutrition: Secondary | ICD-10-CM | POA: Diagnosis not present

## 2016-12-30 DIAGNOSIS — R293 Abnormal posture: Secondary | ICD-10-CM | POA: Diagnosis not present

## 2016-12-30 DIAGNOSIS — R531 Weakness: Secondary | ICD-10-CM | POA: Diagnosis not present

## 2016-12-30 DIAGNOSIS — I480 Paroxysmal atrial fibrillation: Secondary | ICD-10-CM | POA: Diagnosis not present

## 2016-12-30 DIAGNOSIS — I110 Hypertensive heart disease with heart failure: Secondary | ICD-10-CM | POA: Diagnosis not present

## 2016-12-30 DIAGNOSIS — Z8673 Personal history of transient ischemic attack (TIA), and cerebral infarction without residual deficits: Secondary | ICD-10-CM | POA: Diagnosis not present

## 2016-12-30 DIAGNOSIS — I700 Atherosclerosis of aorta: Secondary | ICD-10-CM | POA: Diagnosis not present

## 2016-12-30 DIAGNOSIS — G9340 Encephalopathy, unspecified: Secondary | ICD-10-CM | POA: Diagnosis not present

## 2016-12-30 DIAGNOSIS — N3000 Acute cystitis without hematuria: Secondary | ICD-10-CM | POA: Diagnosis not present

## 2016-12-30 DIAGNOSIS — E119 Type 2 diabetes mellitus without complications: Secondary | ICD-10-CM | POA: Diagnosis not present

## 2016-12-30 DIAGNOSIS — Z7401 Bed confinement status: Secondary | ICD-10-CM | POA: Diagnosis not present

## 2016-12-30 DIAGNOSIS — N390 Urinary tract infection, site not specified: Secondary | ICD-10-CM | POA: Diagnosis not present

## 2016-12-30 DIAGNOSIS — I5022 Chronic systolic (congestive) heart failure: Secondary | ICD-10-CM | POA: Diagnosis not present

## 2016-12-30 DIAGNOSIS — I081 Rheumatic disorders of both mitral and tricuspid valves: Secondary | ICD-10-CM | POA: Diagnosis not present

## 2016-12-30 DIAGNOSIS — H40153 Residual stage of open-angle glaucoma, bilateral: Secondary | ICD-10-CM | POA: Diagnosis not present

## 2016-12-30 LAB — GLUCOSE, CAPILLARY
GLUCOSE-CAPILLARY: 95 mg/dL (ref 65–99)
Glucose-Capillary: 92 mg/dL (ref 65–99)

## 2016-12-30 MED ORDER — APIXABAN 2.5 MG PO TABS: 2.5000 mg | ORAL_TABLET | Freq: Two times a day (BID) | ORAL | Status: AC

## 2016-12-30 MED ORDER — CEFUROXIME AXETIL 500 MG PO TABS: 500.0000 mg | ORAL_TABLET | Freq: Two times a day (BID) | ORAL | 0 refills | Status: AC

## 2016-12-30 NOTE — Progress Notes (Signed)
Report called to Reggie at [facility], EMS called for transport, left a message for daughter Dionne MiloValorie making her aware that EMS was called for transport

## 2016-12-30 NOTE — Discharge Summary (Addendum)
[facility], 83 y.o., DOB 06-Nov-1930, MRN [id number]. Admission date: 12/27/2016 Discharge Date 12/30/2016 Primary MD Rafael Bihari, MD Admitting Physician Tonye Royalty, DO  Admission Diagnosis  Weakness [R53.1] Urinary tract infection without hematuria, site unspecified [N39.0]  Discharge Diagnosis   Active Problems:   UTI (urinary tract infection)   Generalized weakness   Hypertension   Hyperlipemia   H/o afib   H/o dm2   Severe tricuspid regurg   Moderate mitral regurg   History of CVA        Hospital Course  Tamara Campbell is a 83 y.o. female with a known history of CHF, DM, HTN, CVA, PPM presents to the emergency department for evaluation of weakness. She was brought to the emergency room with these complaints and was noted to have urinary tract infection. She was started on therapy with antibiotics given IV fluids with significant improvement in her symptoms. Patient is very weak and deconditioned. In reviewing her past records echocardiogram from December 2017 does show severe tricuspid regurgitation. Patient was seen by physical therapy directed recommending skilled nursing facility which is currently being arranged.     ceftin for 3 days       Consults  None  Significant Tests:  See full reports for all details     Dg Chest 2 View  Result Date: 12/27/2016 CLINICAL DATA:  Generalized weakness EXAM: CHEST  2 VIEW COMPARISON:  08/01/2016 FINDINGS: Left-sided single lead pacing device. Moderate severe cardiomegaly as before. Tortuous calcified aorta. No pleural effusion. No focal consolidation. No pneumothorax. IMPRESSION: Stable cardiomegaly.  No edema or infiltrate. Electronically Signed   By: Jasmine Pang M.D.   On: 12/27/2016 23:25   Ct Head Wo Contrast  Result Date: 12/27/2016 CLINICAL DATA:  83 y/o  F; weakness. EXAM: CT HEAD WITHOUT CONTRAST TECHNIQUE: Contiguous axial images were obtained from  the base of the skull through the vertex without intravenous contrast. COMPARISON:  04/25/2016 CT of the head. FINDINGS: Brain: No evidence of acute infarction, hemorrhage, hydrocephalus, extra-axial collection or mass lesion/mass effect. Stable advanced chronic microvascular ischemic changes and moderate parenchymal volume loss of the brain. Bilateral thalamus, right frontal cortical, and right inferior cerebellar chronic infarctions. Vascular: Extensive calcific atherosclerosis of cavernous internal carotid arteries. Skull: Normal. Negative for fracture or focal lesion. Sinuses/Orbits: No acute finding. Other: None. IMPRESSION: 1. No acute intracranial abnormality identified. 2. Stable advanced chronic microvascular ischemic changes, moderate parenchymal volume loss, and chronic infarcts of the brain. Electronically Signed   By: Mitzi Hansen M.D.   On: 12/27/2016 23:26       Today   Subjective:   Tamara Campbell  feels better denies any complaints  Objective:   Blood pressure (!) 151/91, pulse 76, temperature (!) 96.9 F (36.1 C), temperature source Axillary, resp. rate (!) 22, height 5\' 2"  (1.575 m), weight 128 lb 3.2 oz (58.2 kg), SpO2 95 %.  .  Intake/Output Summary (Last 24 hours) at 12/30/16 1141 Last data filed at 12/30/16 0626  Gross per 24 hour  Intake              240 ml  Output              950 ml  Net             -710 ml    Exam VITAL SIGNS: Blood pressure (!) 151/91, pulse 76, temperature (!) 96.9 F (36.1 C), temperature source Axillary, resp.  rate (!) 22, height 5\' 2"  (1.575 m), weight 128 lb 3.2 oz (58.2 kg), SpO2 95 %.  GENERAL:  83 y.o.-year-old patient lying in the bed with no acute distress.  EYES: Pupils equal, round, reactive to light and accommodation. No scleral icterus. Extraocular muscles intact.  HEENT: Head atraumatic, normocephalic. Oropharynx and nasopharynx clear.  NECK:  Supple, no jugular venous distention. No thyroid enlargement, no  tenderness.  LUNGS: Normal breath sounds bilaterally, no wheezing, rales,rhonchi or crepitation. No use of accessory muscles of respiration.  CARDIOVASCULAR: S1, S2 normal.Positive murmurs, rubs, or gallops.  ABDOMEN: Soft, nontender, nondistended. Bowel sounds present. No organomegaly or mass.  EXTREMITIES: No pedal edema, cyanosis, or clubbing.  NEUROLOGIC: Cranial nerves II through XII are intact. Muscle strength 5/5 in all extremities. Sensation intact. Gait not checked.  PSYCHIATRIC: The patient is alert And not oriented SKIN: No obvious rash, lesion, or ulcer.   Data Review     CBC w Diff:  Lab Results  Component Value Date   WBC 3.6 12/28/2016   HGB 12.6 12/28/2016   HCT 38.2 12/28/2016   PLT 175 12/28/2016   LYMPHOPCT 29 01/08/2015   MONOPCT 7 01/08/2015   EOSPCT 2 01/08/2015   BASOPCT 0 01/08/2015   CMP:  Lab Results  Component Value Date   NA 138 12/29/2016   K 3.6 12/29/2016   CL 105 12/29/2016   CO2 25 12/29/2016   BUN 14 12/29/2016   CREATININE 0.93 12/29/2016   PROT 7.5 12/27/2016   ALBUMIN 4.0 12/27/2016   BILITOT 0.8 12/27/2016   ALKPHOS 90 12/27/2016   AST 22 12/27/2016   ALT 15 12/27/2016  .  Micro Results Recent Results (from the past 240 hour(s))  Urine culture     Status: None   Collection Time: 12/27/16  9:05 PM  Result Value Ref Range Status   Specimen Description URINE, RANDOM  Final   Special Requests NONE  Final   Culture   Final    NO GROWTH Performed at [facility], [address] [place], [place] [address]    Report Status 12/29/2016 FINAL  Final        Code Status Orders        Start     Ordered   12/28/16 0128  Full code  Continuous     12/28/16 0128    Code Status History    Date Active Date Inactive Code Status Order ID Comments User Context   04/26/2016  3:21 AM 04/27/2016  8:22 PM Full Code [id number]  Oralia ManisWillis, David, MD Inpatient   01/09/2015 12:24 AM 01/13/2015  7:20 PM Full Code [id number]  Thana Farreynolds, Leslie,  MD Inpatient    Advance Directive Documentation     Most Recent Value  Type of Advance Directive  Healthcare Power of Attorney  Pre-existing out of facility DNR order (yellow form or pink MOST form)  -  "MOST" Form in Place?  -          Contact information for after-discharge care    Destination    [facility] Follow up.   Specialty:  Skilled Nursing Facility Contact information: [address] [place] [id number] [phone]              Discharge Medications   Allergies as of 12/30/2016   No Known Allergies     Medication List    STOP taking these medications   sulfamethoxazole-trimethoprim 800-160 MG tablet Commonly known as:  BACTRIM DS,SEPTRA DS  TAKE these medications   acetaminophen 500 MG tablet Commonly known as:  TYLENOL Take 500 mg by mouth every 4 (four) hours as needed for mild pain or fever.   amLODipine-benazepril 10-20 MG capsule Commonly known as:  LOTREL Take 1 capsule by mouth daily.   apixaban 2.5 MG Tabs tablet Commonly known as:  ELIQUIS Take 1 tablet (2.5 mg total) by mouth 2 (two) times daily. What changed:  medication strength  how much to take   atorvastatin 20 MG tablet Commonly known as:  LIPITOR Take 1 tablet (20 mg total) by mouth daily at 6 PM.   bimatoprost 0.01 % Soln Commonly known as:  LUMIGAN Place 1 drop into both eyes at bedtime.   carvedilol 6.25 MG tablet Commonly known as:  COREG Take 1 tablet (6.25 mg total) by mouth 2 (two) times daily with a meal.   cefUROXime 500 MG tablet Commonly known as:  CEFTIN Take 1 tablet (500 mg total) by mouth 2 (two) times daily with a meal.   cholecalciferol 1000 units tablet Commonly known as:  VITAMIN D Take 2,000 Units by mouth daily.   hydrochlorothiazide 25 MG tablet Commonly known as:  HYDRODIURIL Take 25 mg by mouth daily.   metFORMIN 500 MG tablet Commonly known as:  GLUCOPHAGE Take 500 mg by mouth 2 (two) times daily  with a meal.   multivitamin with minerals Tabs tablet Take 1 tablet by mouth daily.   neomycin-bacitracin-polymyxin Oint Commonly known as:  NEOSPORIN Apply 1 application topically daily. Apply Neosporin to left leg wound Q day, then cover with foam dressing.  Change foam dressing Q 5 days or PRN soiling.   potassium chloride 10 MEQ tablet Commonly known as:  K-DUR,KLOR-CON Take 10 mEq by mouth daily.          Total Time in preparing paper work, data evaluation and todays exam - 35 minutes  Auburn BilberryPATEL, Deidrea Gaetz M.D on 12/30/2016 at 11:41 AM  [facility]   Office  [phone]

## 2016-12-30 NOTE — Progress Notes (Signed)
Yellow arm band in place.  Purewick put in place and effective.  Pt comfortable and all needs met. 

## 2016-12-30 NOTE — Clinical Social Work Placement (Signed)
   CLINICAL SOCIAL WORK PLACEMENT  NOTE  Date:  12/30/2016  Patient Details  Name: Tamara Campbell MRN: [id number] Date of Birth: 12/19/1930  Clinical Social Work is seeking post-discharge placement for this patient at the Skilled  Nursing Facility level of care (*CSW will initial, date and re-position this form in  chart as items are completed):  Yes   Patient/family provided with [facility] Clinical Social Work Department's list of facilities offering this level of care within the geographic area requested by the patient (or if unable, by the patient's family).  Yes   Patient/family informed of their freedom to choose among providers that offer the needed level of care, that participate in Medicare, Medicaid or managed care program needed by the patient, have an available bed and are willing to accept the patient.  Yes   Patient/family informed of [facility]'s ownership interest in [facility] and [facility], as well as of the fact that they are under no obligation to receive care at these facilities.  PASRR submitted to EDS on       PASRR number received on       Existing PASRR number confirmed on 12/28/16     FL2 transmitted to all facilities in geographic area requested by pt/family on 12/29/16     FL2 transmitted to all facilities within larger geographic area on       Patient informed that his/her managed care company has contracts with or will negotiate with certain facilities, including the following:        Yes   Patient/family informed of bed offers received.  Patient [facility]     Physician recommends and patient chooses bed at      Patient to be [facility] on 12/30/16.  Patient to be [facility] to facility [facility]     Patient family notified on 12/30/16 of transfer.  Name of family member notified:  Pt's daughter, [name]     PHYSICIAN       Additional Comment:     _______________________________________________ Dede QuerySarah Jyllian Haynie, LCSW 12/30/2016, 12:27 PM

## 2016-12-30 NOTE — Clinical Social Work Note (Signed)
Pt is ready for discharge today and will go to [facility]. [employer] has been obtained. Pt's daughter is aware and agreeable to discharge plan. Facility has received discharge information and is ready to admit pt. RN will call report. [facility] will provide transportation. CSW is signing off as no further needs identified.   Dede QuerySarah Conswella Bruney, MSW, LCSW  Clinical Social Worker  [phone]

## 2016-12-30 NOTE — Progress Notes (Signed)
Pt being discharge at this time via EMS, pt with no complaints, family at bedside

## 2017-01-04 ENCOUNTER — Encounter
Admission: RE | Admit: 2017-01-04 | Discharge: 2017-01-04 | Disposition: A | Discharge status: Home / Self Care | Payer: Medicare HMO | Source: Ambulatory Visit | Attending: Internal Medicine | Admitting: Internal Medicine

## 2017-01-04 DIAGNOSIS — E119 Type 2 diabetes mellitus without complications: Secondary | ICD-10-CM | POA: Diagnosis not present

## 2017-01-04 DIAGNOSIS — I5022 Chronic systolic (congestive) heart failure: Secondary | ICD-10-CM | POA: Diagnosis not present

## 2017-01-04 DIAGNOSIS — N3000 Acute cystitis without hematuria: Secondary | ICD-10-CM | POA: Diagnosis not present

## 2017-01-04 DIAGNOSIS — E441 Mild protein-calorie malnutrition: Secondary | ICD-10-CM | POA: Diagnosis not present

## 2017-01-04 DIAGNOSIS — G9340 Encephalopathy, unspecified: Secondary | ICD-10-CM | POA: Diagnosis not present

## 2017-01-09 ENCOUNTER — Ambulatory Visit: Payer: Medicare HMO

## 2017-01-11 ENCOUNTER — Non-Acute Institutional Stay (SKILLED_NURSING_FACILITY): Payer: Medicare HMO | Admitting: Gerontology

## 2017-01-11 DIAGNOSIS — R531 Weakness: Secondary | ICD-10-CM

## 2017-01-12 ENCOUNTER — Encounter: Payer: Self-pay | Admitting: Gerontology

## 2017-01-12 NOTE — Progress Notes (Signed)
Location:   [facility] Room Number: 202B Place of Service:  SNF [phone]) Provider:  Lorenso Quarry, NP-C  Rafael Bihari, MD  Patient Care Team: Rafael Bihari, MD as PCP - General (Internal Medicine)  Extended Emergency Contact Information Primary Emergency Contact: Tommie Raymond Address: [address]          [place], [place] [address] [place] Home Phone: [phone] Relation: Daughter Secondary Emergency Contact: Adria [place] Home Phone: [phone] Relation: Son  Code Status:  DNR Goals of care: Advanced Directive information Advanced Directives 01/12/2017  Does Patient Have a Medical Advance Directive? Yes  Type of Advance Directive Out of facility DNR (pink MOST or yellow form)  Does patient want to make changes to medical advance directive? No - Patient declined  Copy of Healthcare Power of Attorney in Chart? -  Would patient like information on creating a medical advance directive? -     Chief Complaint  Patient presents with  . Medical Management of Chronic Issues    Routine Visit    HPI:  Pt is a 83 y.o. female seen today for follow up. Pt was admitted to the facility for rehab for generalized weakness after admission to [facility] for UTI, weakness. Pt is somewhat participating with therapy. She is incontinent of B&B. Requires 1+ assistance for ADL's. She does get confused at times and tries to hit at staff. Poor appetite at times. Denies pain. Intermittent constipation. Sleeps soundly a lot of the time. VSS. No other complaints.      Past Medical History:  Diagnosis Date  . Aortic valve sclerosis   . Bradycardia   . Cardiac pacemaker in situ    St. Jude single chamber pacemaker model 1210 SN# [id number] with St Jude 1888tc RV SN# [id number] both implanted on 04/26/2011  . CHF (congestive heart failure) (HCC)   . CVA (cerebral vascular accident) (HCC)    Right sided weakness  .  Diabetes mellitus without complication (HCC)   . Dyspnea, unspecified   . Heart block    intermittent 3rd degree heart block with ventricular escape rate of approximatley 38bpm  . HFrEF (heart failure with reduced ejection fraction) (HCC)   . History of chicken pox   . History of echocardiogram 03/07/2011   Normal LV size/function. Moderate aortic root dilation, Moderate MR. Severe TR. PASP .  Marland Kitchen Hyperlipidemia, unspecified   . Hypertension   . Osteoarthritis   . Pacemaker   . Paroxysmal atrial fibrillation (HCC)    Documented in August 2016 at the time of CVA  . Stroke [facility]) 2005   Past Surgical History:  Procedure Laterality Date  . ABDOMINAL HYSTERECTOMY    . INSERT REPLACE REMOVE PACEMAKER    . JOINT REPLACEMENT Bilateral    hips  . LUMBAR FUSION  1996  . PACEMAKER PLACEMENT Left    DUAL CHAMBER PACEMAKER GENERATOR  . TOTAL KNEE ARTHROPLASTY Right 08/10/2012   Procedure: RIGHT TOTAL KNEE ARTHROPLASTY; Surgeon: Christell Faith, MD; [facility]; Service: Orthopedics; Laterality: Right;    No Known Allergies  Allergies as of 01/11/2017   No Known Allergies     Medication List       Accurate as of 01/11/17 11:59 PM. Always use your most recent med list.          acetaminophen 500 MG tablet Commonly known as:  TYLENOL Take 500 mg by mouth every 4 (four) hours as needed for  mild pain or fever.   amLODipine-benazepril 10-20 MG capsule Commonly known as:  LOTREL Take 1 capsule by mouth daily.   apixaban 2.5 MG Tabs tablet Commonly known as:  ELIQUIS Take 1 tablet (2.5 mg total) by mouth 2 (two) times daily.   atorvastatin 20 MG tablet Commonly known as:  LIPITOR Take 1 tablet (20 mg total) by mouth daily at 6 PM.   bimatoprost 0.01 % Soln Commonly known as:  LUMIGAN Place 1 drop into both eyes at bedtime.   carvedilol 6.25 MG tablet Commonly known as:  COREG Take 1 tablet (6.25 mg total) by mouth 2 (two) times daily with a meal.     cholecalciferol 1000 units tablet Commonly known as:  VITAMIN D Take 2,000 Units by mouth daily.   hydrochlorothiazide 25 MG tablet Commonly known as:  HYDRODIURIL Take 25 mg by mouth daily.   metFORMIN 500 MG tablet Commonly known as:  GLUCOPHAGE Take 500 mg by mouth 2 (two) times daily with a meal.   mirtazapine 15 MG tablet Commonly known as:  REMERON Take 7.5 mg by mouth at bedtime. 1/2 tab   multivitamin with minerals Tabs tablet Take 1 tablet by mouth daily.   neomycin-bacitracin-polymyxin [phone] ointment Apply 1 application topically to left leg wound every day and cover with foam dressing.   potassium chloride 10 MEQ tablet Commonly known as:  K-DUR,KLOR-CON Take 10 mEq by mouth daily.       Review of Systems  Constitutional: Positive for fatigue. Negative for activity change, appetite change, chills, diaphoresis and fever.  HENT: Negative for congestion, sneezing, sore throat, trouble swallowing and voice change.   Respiratory: Negative for apnea, cough, choking, chest tightness, shortness of breath and wheezing.   Cardiovascular: Negative for chest pain, palpitations and leg swelling.  Gastrointestinal: Negative for abdominal distention, abdominal pain, constipation, diarrhea and nausea.  Genitourinary: Negative for difficulty urinating, dysuria, frequency and urgency.  Musculoskeletal: Negative for back pain, gait problem and myalgias. Arthralgias: typical arthritis.  Skin: Negative for color change, pallor, rash and wound.  Neurological: Negative for dizziness, tremors, syncope, speech difficulty, weakness, numbness and headaches.  Psychiatric/Behavioral: Negative for agitation and behavioral problems.  All other systems reviewed and are negative.   Immunization History  Administered Date(s) Administered  . Influenza Split 08-01-202015  . Influenza, High Dose Seasonal PF 02/23/2016  . Pneumococcal Conjugate-13 02/23/2016  . Pneumococcal  Polysaccharide-23 01/10/2015   Pertinent  Health Maintenance Due  Topic Date Due  . FOOT EXAM  01/11/1941  . OPHTHALMOLOGY EXAM  01/11/1941  . DEXA SCAN  01/10/1998  . PNA vac Low Risk Adult (2 of 2 - PCV13) 01/10/2016  . HEMOGLOBIN A1C  10/24/2016  . INFLUENZA VACCINE  01/04/2017   Fall Risk  09/14/2015 09/14/2015 03/16/2015 03/16/2015  Falls in the past year? No No Yes No  Number falls in past yr: - - 1 -  Injury with Fall? - - Yes -  Risk for fall due to : Impaired balance/gait - - -  Follow up - - Education provided;Falls prevention discussed -   Functional Status Survey:    Vitals:   01/11/17 0820  BP: 97/67  Pulse: 70  Resp: 18  Temp: 98 F (36.7 C)  SpO2: 94%  Weight: 112 lb 14.4 oz (51.2 kg)  Height: 5\' 2"  (1.575 m)   Body mass index is 20.65 kg/m. Physical Exam  Constitutional: She is oriented to person, place, and time. Vital signs are normal. She appears well-developed and well-nourished.  She is active and cooperative. She does not appear ill. No distress.  HENT:  Head: Normocephalic and atraumatic.  Mouth/Throat: Uvula is midline, oropharynx is clear and moist and mucous membranes are normal. Mucous membranes are not pale, not dry and not cyanotic.  Eyes: Pupils are equal, round, and reactive to light. Conjunctivae, EOM and lids are normal.  Neck: Trachea normal, normal range of motion and full passive range of motion without pain. Neck supple. No JVD present. No tracheal deviation, no edema and no erythema present. No thyromegaly present.  Cardiovascular: Normal rate, regular rhythm, normal heart sounds, intact distal pulses and normal pulses.  Exam reveals no gallop, no distant heart sounds and no friction rub.   No murmur heard. Pulmonary/Chest: Effort normal and breath sounds normal. No accessory muscle usage. No respiratory distress. She has no wheezes. She has no rales. She exhibits no tenderness.  Abdominal: Normal appearance and bowel sounds are normal.  She exhibits no distension and no ascites. There is no tenderness.  Musculoskeletal: Normal range of motion. She exhibits no edema or tenderness.  Expected osteoarthritis, stiffness  Neurological: She is alert and oriented to person, place, and time. She has normal strength.  Skin: Skin is warm, dry and intact. No rash noted. She is not diaphoretic. No cyanosis or erythema. No pallor. Nails show no clubbing.  Psychiatric: She has a normal mood and affect. Her speech is normal and behavior is normal. Judgment and thought content normal. Cognition and memory are impaired.  Nursing note and vitals reviewed.   Labs reviewed:  Recent Labs  12/27/16 2105 12/28/16 0454 12/29/16 0322  NA 140 139 138  K 3.6 3.2* 3.6  CL 105 105 105  CO2 25 25 25   GLUCOSE 99 91 98  BUN 21* 18 14  CREATININE 1.02* 0.72 0.93  CALCIUM 9.7 9.0 8.9    Recent Labs  04/25/16 2020 12/27/16 2105  AST 29 22  ALT 24 15  ALKPHOS 80 90  BILITOT 0.5 0.8  PROT 7.4 7.5  ALBUMIN 4.0 4.0    Recent Labs  08/01/16 1507 12/27/16 2105 12/28/16 0454  WBC 3.6 3.4* 3.6  HGB 12.7 13.3 12.6  HCT 38.1 40.6 38.2  MCV 83.8 82.9 84.1  PLT 212 192 175   No results found for: TSH Lab Results  Component Value Date   HGBA1C 6.0 (H) 04/26/2016   Lab Results  Component Value Date   CHOL 150 04/26/2016   HDL 59 04/26/2016   LDLCALC 81 04/26/2016   TRIG 51 04/26/2016   CHOLHDL 2.5 04/26/2016    Significant Diagnostic Results in last 30 days:  Dg Chest 2 View  Result Date: 12/27/2016 CLINICAL DATA:  Generalized weakness EXAM: CHEST  2 VIEW COMPARISON:  08/01/2016 FINDINGS: Left-sided single lead pacing device. Moderate severe cardiomegaly as before. Tortuous calcified aorta. No pleural effusion. No focal consolidation. No pneumothorax. IMPRESSION: Stable cardiomegaly.  No edema or infiltrate. Electronically Signed   By: Jasmine Pang M.D.   On: 12/27/2016 23:25   Ct Head Wo Contrast  Result Date:  12/27/2016 CLINICAL DATA:  83 y/o  F; weakness. EXAM: CT HEAD WITHOUT CONTRAST TECHNIQUE: Contiguous axial images were obtained from the base of the skull through the vertex without intravenous contrast. COMPARISON:  04/25/2016 CT of the head. FINDINGS: Brain: No evidence of acute infarction, hemorrhage, hydrocephalus, extra-axial collection or mass lesion/mass effect. Stable advanced chronic microvascular ischemic changes and moderate parenchymal volume loss of the brain. Bilateral thalamus, right frontal cortical, and  right inferior cerebellar chronic infarctions. Vascular: Extensive calcific atherosclerosis of cavernous internal carotid arteries. Skull: Normal. Negative for fracture or focal lesion. Sinuses/Orbits: No acute finding. Other: None. IMPRESSION: 1. No acute intracranial abnormality identified. 2. Stable advanced chronic microvascular ischemic changes, moderate parenchymal volume loss, and chronic infarcts of the brain. Electronically Signed   By: Mitzi HansenLance  Furusawa-Stratton M.D.   On: 12/27/2016 23:26    Assessment/Plan 1. Generalized weakness  Continue PT/OT  Continue exercises as taught by PT/OT  Encourage PO intake  Safety precautions   Family/ staff Communication:   Total Time:  Documentation:  Face to Face:  Family/Phone:   Labs/tests ordered:    Medication list reviewed and assessed for continued appropriateness. Monthly medication orders reviewed and signed.  Brynda RimShannon H. Halena Mohar, NP-C Geriatrics [facility] [id number] [place], [place] [address] Cell Phone (Mon-Fri 8am-5pm):  [phone] On Call:  [phone] & follow prompts after 5pm & weekends Office Phone:  [phone] Office Fax:  [phone]

## 2017-01-18 ENCOUNTER — Non-Acute Institutional Stay (SKILLED_NURSING_FACILITY): Payer: Medicare HMO | Admitting: Gerontology

## 2017-01-18 ENCOUNTER — Encounter: Payer: Self-pay | Admitting: Gerontology

## 2017-01-18 DIAGNOSIS — R531 Weakness: Secondary | ICD-10-CM | POA: Diagnosis not present

## 2017-01-18 NOTE — Progress Notes (Signed)
Location:   [facility] Room Number: 202B Place of Service:  SNF [phone]) Provider:  Toni Arthurs, NP-C  Madelyn Brunner, MD  Patient Care Team: Madelyn Brunner, MD as PCP - General (Internal Medicine) Dannielle Karvonen, RN as [facility] Management  Extended Emergency Contact Information Primary Emergency Contact: Cyndee Brightly Address: [address]          [place], [place] [address] Johnnette Litter of [place] Phone: [phone] Relation: Daughter Secondary Emergency Contact: Delfin Gant [place] of [place] Phone: [phone] Relation: Son  Code Status:  DNR Goals of care: Advanced Directive information Advanced Directives 01/18/2017  Does Patient Have a Medical Advance Directive? Yes  Type of Advance Directive Out of facility DNR (pink MOST or yellow form)  Does patient want to make changes to medical advance directive? No - Patient declined  Copy of [place] in Chart? -  Would patient like information on creating a medical advance directive? -     Chief Complaint  Patient presents with  . Medical Management of Chronic Issues    Routine Visit    HPI:  Pt is a 83 y.o. female seen today for follow up.Pt was admitted to the facility for rehab for generalized weakness after admission to [facility] for UTI, weakness. UTI is resolved.  Pt is somewhat participating with therapy. She is incontinent of B&B. Requires 1+ assistance for ADL's. She does get confused at times and tries to hit at staff. Poor appetite at times. Denies pain. Intermittent constipation. Sleeps soundly a lot of the time. Pt is likely going to transition to long-term care resident in the near future. VSS. No other complaints.     Past Medical History:  Diagnosis Date  . Aortic valve sclerosis   . Bradycardia   . Cardiac pacemaker in situ    St. Jude single chamber pacemaker model 1210 SN# [id number] with St Jude 1888tc RV  SN# [id number] both implanted on 04/26/2011  . CHF (congestive heart failure) ([place])   . CVA (cerebral vascular accident) ([place])    Right sided weakness  . Diabetes mellitus without complication ([place])   . Dyspnea, unspecified   . Heart block    intermittent 3rd degree heart block with ventricular escape rate of approximatley 38bpm  . HFrEF (heart failure with reduced ejection fraction) ([place])   . History of chicken pox   . History of echocardiogram 03/07/2011   Normal LV size/function. Moderate aortic root dilation, Moderate MR. Severe TR. PASP 69mHg.  .Mar[place] KitchenHyperlipidemia, unspecified   . Hypertension   . Osteoarthritis   . Pacemaker   . Paroxysmal atrial fibrillation ([facility]    Documented in August 2016 at the time of CVA  . Stroke ([facility] 2005   Past Surgical History:  Procedure Laterality Date  . ABDOMINAL HYSTERECTOMY    . INSERT REPLACE REMOVE PACEMAKER    . JOINT REPLACEMENT Bilateral    hips  . LUMBAR FUSION  1996  . PACEMAKER PLACEMENT Left    DUAL CHAMBER PACEMAKER GENERATOR  . TOTAL KNEE ARTHROPLASTY Right 08/10/2012   Procedure: RIGHT TOTAL KNEE ARTHROPLASTY; Surgeon: PZorita Pang MD; [facility]: [facility] Service: Orthopedics; Laterality: Right;    No Known Allergies  Allergies as of 01/18/2017   No Known Allergies     Medication List       Accurate as of 01/18/17 10:40 AM. Always use your most recent med list.  acetaminophen 500 MG tablet Commonly known as:  TYLENOL Take 500 mg by mouth every 4 (four) hours as needed for mild pain or fever.   amLODipine-benazepril 10-20 MG capsule Commonly known as:  LOTREL Take 1 capsule by mouth daily.   apixaban 2.5 MG Tabs tablet Commonly known as:  ELIQUIS Take 1 tablet (2.5 mg total) by mouth 2 (two) times daily.   atorvastatin 20 MG tablet Commonly known as:  LIPITOR Take 1 tablet (20 mg total) by mouth daily at 6 PM.   bimatoprost 0.01 % Soln Commonly known as:  LUMIGAN Place 1 drop into both  eyes at bedtime.   carvedilol 6.25 MG tablet Commonly known as:  COREG Take 1 tablet (6.25 mg total) by mouth 2 (two) times daily with a meal.   cholecalciferol 1000 units tablet Commonly known as:  VITAMIN D Take 2,000 Units by mouth daily.   hydrochlorothiazide 25 MG tablet Commonly known as:  HYDRODIURIL Take 25 mg by mouth daily.   metFORMIN 500 MG tablet Commonly known as:  GLUCOPHAGE Take 500 mg by mouth 2 (two) times daily with a meal.   mirtazapine 15 MG tablet Commonly known as:  REMERON Take 7.5 mg by mouth at bedtime. 1/2 tab   multivitamin with minerals Tabs tablet Take 1 tablet by mouth daily.   neomycin-bacitracin-polymyxin [phone] ointment Apply 1 application topically to left leg wound every day and cover with foam dressing.   potassium chloride 10 MEQ tablet Commonly known as:  K-DUR,KLOR-CON Take 10 mEq by mouth daily.       Review of Systems  Constitutional: Positive for fatigue. Negative for activity change, appetite change, chills, diaphoresis and fever.  HENT: Negative for congestion, sneezing, sore throat, trouble swallowing and voice change.   Respiratory: Negative for apnea, cough, choking, chest tightness, shortness of breath and wheezing.   Cardiovascular: Negative for chest pain, palpitations and leg swelling.  Gastrointestinal: Negative for abdominal distention, abdominal pain, constipation, diarrhea and nausea.  Genitourinary: Negative for difficulty urinating, dysuria, frequency and urgency.  Musculoskeletal: Negative for back pain, gait problem and myalgias. Arthralgias: typical arthritis.  Skin: Negative for color change, pallor, rash and wound.  Neurological: Negative for dizziness, tremors, syncope, speech difficulty, weakness, numbness and headaches.  Psychiatric/Behavioral: Negative for agitation and behavioral problems.  All other systems reviewed and are negative.   Immunization History  Administered Date(s) Administered  .  Influenza Split 04/08/2014  . Influenza, High Dose Seasonal PF 02/23/2016  . Pneumococcal Conjugate-13 02/23/2016  . Pneumococcal Polysaccharide-23 01/10/2015   Pertinent  Health Maintenance Due  Topic Date Due  . FOOT EXAM  01/11/1941  . OPHTHALMOLOGY EXAM  01/11/1941  . DEXA SCAN  01/10/1998  . HEMOGLOBIN A1C  10/24/2016  . INFLUENZA VACCINE  01/04/2017  . PNA vac Low Risk Adult  Completed   Fall Risk  09/14/2015 09/14/2015 03/16/2015 03/16/2015  Falls in the past year? No No Yes No  Number falls in past yr: - - 1 -  Injury with Fall? - - Yes -  Risk for fall due to : Impaired balance/gait - - -  Follow up - - Education provided;Falls prevention discussed -   Functional Status Survey:    Vitals:   01/18/17 1035  BP: 125/75  Pulse: 73  Resp: 18  Temp: 98.1 F (36.7 C)  SpO2: 99%  Weight: 106 lb 4.8 oz (48.2 kg)  Height: 5' 2"  (1.575 m)   Body mass index is 19.44 kg/m. Physical Exam  Constitutional: She  is oriented to person, place, and time. Vital signs are normal. She appears well-developed and well-nourished. She is active and cooperative. She does not appear ill. No distress.  HENT:  Head: Normocephalic and atraumatic.  Mouth/Throat: Uvula is midline, oropharynx is clear and moist and mucous membranes are normal. Mucous membranes are not pale, not dry and not cyanotic.  Eyes: Pupils are equal, round, and reactive to light. Conjunctivae, EOM and lids are normal.  Neck: Trachea normal, normal range of motion and full passive range of motion without pain. Neck supple. No JVD present. No tracheal deviation, no edema and no erythema present. No thyromegaly present.  Cardiovascular: Normal rate, regular rhythm, normal heart sounds, intact distal pulses and normal pulses.  Exam reveals no gallop, no distant heart sounds and no friction rub.   No murmur heard. Pulmonary/Chest: Effort normal and breath sounds normal. No accessory muscle usage. No respiratory distress. She has  no wheezes. She has no rales. She exhibits no tenderness.  Abdominal: Normal appearance and bowel sounds are normal. She exhibits no distension and no ascites. There is no tenderness.  Musculoskeletal: Normal range of motion. She exhibits no edema or tenderness.  Expected osteoarthritis, stiffness  Neurological: She is alert and oriented to person, place, and time. She has normal strength.  Skin: Skin is warm, dry and intact. No rash noted. She is not diaphoretic. No cyanosis or erythema. No pallor. Nails show no clubbing.  Psychiatric: She has a normal mood and affect. Her speech is normal and behavior is normal. Judgment and thought content normal. Cognition and memory are impaired.  Nursing note and vitals reviewed.   Labs reviewed:  Recent Labs  12/27/16 2105 12/28/16 0454 12/29/16 0322  NA 140 139 138  K 3.6 3.2* 3.6  CL 105 105 105  CO2 25 25 25   GLUCOSE 99 91 98  BUN 21* 18 14  CREATININE 1.02* 0.72 0.93  CALCIUM 9.7 9.0 8.9    Recent Labs  04/25/16 2020 12/27/16 2105  AST 29 22  ALT 24 15  ALKPHOS 80 90  BILITOT 0.5 0.8  PROT 7.4 7.5  ALBUMIN 4.0 4.0    Recent Labs  08/01/16 1507 12/27/16 2105 12/28/16 0454  WBC 3.6 3.4* 3.6  HGB 12.7 13.3 12.6  HCT 38.1 40.6 38.2  MCV 83.8 82.9 84.1  PLT 212 192 175   No results found for: TSH Lab Results  Component Value Date   HGBA1C 6.0 (H) 04/26/2016   Lab Results  Component Value Date   CHOL 150 04/26/2016   HDL 59 04/26/2016   LDLCALC 81 04/26/2016   TRIG 51 04/26/2016   CHOLHDL 2.5 04/26/2016    Significant Diagnostic Results in last 30 days:  Dg Chest 2 View  Result Date: 12/27/2016 CLINICAL DATA:  Generalized weakness EXAM: CHEST  2 VIEW COMPARISON:  08/01/2016 FINDINGS: Left-sided single lead pacing device. Moderate severe cardiomegaly as before. Tortuous calcified aorta. No pleural effusion. No focal consolidation. No pneumothorax. IMPRESSION: Stable cardiomegaly.  No edema or infiltrate.  Electronically Signed   By: Donavan Foil M.D.   On: 12/27/2016 23:25   Ct Head Wo Contrast  Result Date: 12/27/2016 CLINICAL DATA:  83 y/o  F; weakness. EXAM: CT HEAD WITHOUT CONTRAST TECHNIQUE: Contiguous axial images were obtained from the base of the skull through the vertex without intravenous contrast. COMPARISON:  04/25/2016 CT of the head. FINDINGS: Brain: No evidence of acute infarction, hemorrhage, hydrocephalus, extra-axial collection or mass lesion/mass effect. Stable advanced chronic microvascular  ischemic changes and moderate parenchymal volume loss of the brain. Bilateral thalamus, right frontal cortical, and right inferior cerebellar chronic infarctions. Vascular: Extensive calcific atherosclerosis of cavernous internal carotid arteries. Skull: Normal. Negative for fracture or focal lesion. Sinuses/Orbits: No acute finding. Other: None. IMPRESSION: 1. No acute intracranial abnormality identified. 2. Stable advanced chronic microvascular ischemic changes, moderate parenchymal volume loss, and chronic infarcts of the brain. Electronically Signed   By: Kristine Garbe M.D.   On: 12/27/2016 23:26    Assessment/Plan 1. Generalized weakness  Continue PT/OT  Continue exercises as taught by PT/OT  Encourage PO intake  Safety precautions    Family/ staff Communication:   Total Time:  Documentation:  Face to Face:  Family/Phone:   Labs/tests ordered:  Cbc, met c  Medication list reviewed and assessed for continued appropriateness. Monthly medication orders reviewed and signed.  Vikki Ports, NP-C Geriatrics [facility] [phone] [place], [place] [address] Cell Phone (Mon-Fri 8am-5pm):  [phone] On Call:  [phone] & follow prompts after 5pm & weekends Office Phone:  [phone] Office Fax:  [phone]

## 2017-01-30 ENCOUNTER — Non-Acute Institutional Stay (SKILLED_NURSING_FACILITY): Payer: Medicare HMO | Admitting: Gerontology

## 2017-01-30 DIAGNOSIS — E162 Hypoglycemia, unspecified: Secondary | ICD-10-CM

## 2017-01-31 ENCOUNTER — Encounter: Payer: Self-pay | Admitting: Gerontology

## 2017-01-31 NOTE — Progress Notes (Signed)
Location:   [facility] Room Number: 319B Place of Service:  SNF [phone]) Provider:  Lorenso Quarry, NP-C  Rafael Bihari, MD  Patient Care Team: Rafael Bihari, MD as PCP - General (Internal Medicine) Otho Ket, RN as [facility] Management  Extended Emergency Contact Information Primary Emergency Contact: Tommie Raymond Address: [address]          [place], [place] [address] Darden Amber of [place] Home Phone: [phone] Relation: Daughter Secondary Emergency Contact: Adria Devon [place] of [place] Home Phone: [phone] Relation: Son  Code Status:  DNR Goals of care: Advanced Directive information Advanced Directives 01/30/2017  Does Patient Have a Medical Advance Directive? Yes  Type of Estate agent of [place];Out of facility DNR (pink MOST or yellow form)  Does patient want to make changes to medical advance directive? No - Patient declined  Copy of Healthcare Power of Attorney in Chart? -  Would patient like information on creating a medical advance directive? -     Chief Complaint  Patient presents with  . Acute Visit    Follow up on hypoglycemic episode    HPI:  Pt is a 83 y.o. female seen today for an acute visit for hypoglycemic episode. Pt had episode during the night with BG of 38. She has had a small weight loss as of late. She is not eating well. She is being followed bu the dietician for supplement orders, etc. Pt received oiange juice to stabilize BGs. Effective. She now states she is feeling fine and without complaints. VSS. No other complaints.     Past Medical History:  Diagnosis Date  . Aortic valve sclerosis   . Bradycardia   . Cardiac pacemaker in situ    St. Jude single chamber pacemaker model 1210 SN# [id number] with St Jude 1888tc RV SN# [id number] both implanted on 04/26/2011  . CHF (congestive heart failure) ([facility])   . CVA (cerebral vascular  accident) ([facility])    Right sided weakness  . Diabetes mellitus without complication ([facility])   . Dyspnea, unspecified   . Heart block    intermittent 3rd degree heart block with ventricular escape rate of approximatley 38bpm  . HFrEF (heart failure with reduced ejection fraction) ([facility])   . History of chicken pox   . History of echocardiogram 03/07/2011   Normal LV size/function. Moderate aortic root dilation, Moderate MR. Severe TR. PASP .  Mar[place] Kitchen Hyperlipidemia, unspecified   . Hypertension   . Osteoarthritis   . Pacemaker   . Paroxysmal atrial fibrillation ([facility])    Documented in August 2016 at the time of CVA  . Stroke [facility]) 2005   Past Surgical History:  Procedure Laterality Date  . ABDOMINAL HYSTERECTOMY    . INSERT REPLACE REMOVE PACEMAKER    . JOINT REPLACEMENT Bilateral    hips  . LUMBAR FUSION  1996  . PACEMAKER PLACEMENT Left    DUAL CHAMBER PACEMAKER GENERATOR  . TOTAL KNEE ARTHROPLASTY Right 08/10/2012   Procedure: RIGHT TOTAL KNEE ARTHROPLASTY; Surgeon: Christell Faith, MD; Location: [facility] OR; Service: Orthopedics; Laterality: Right;    No Known Allergies  Allergies as of 01/30/2017   No Known Allergies     Medication List       Accurate as of 01/30/17 11:59 PM. Always use your most recent med list.          acetaminophen 500 MG tablet Commonly known as:  TYLENOL Take 500 mg  by mouth every 4 (four) hours as needed for mild pain or fever.   amLODipine-benazepril 10-20 MG capsule Commonly known as:  LOTREL Take 1 capsule by mouth daily.   apixaban 2.5 MG Tabs tablet Commonly known as:  ELIQUIS Take 1 tablet (2.5 mg total) by mouth 2 (two) times daily.   atorvastatin 20 MG tablet Commonly known as:  LIPITOR Take 1 tablet (20 mg total) by mouth daily at 6 PM.   bimatoprost 0.01 % Soln Commonly known as:  LUMIGAN Place 1 drop into both eyes at bedtime.   carvedilol 6.25 MG tablet Commonly known as:  COREG Take 1 tablet (6.25 mg total) by mouth  2 (two) times daily with a meal.   cholecalciferol 1000 units tablet Commonly known as:  VITAMIN D Take 2,000 Units by mouth daily.   hydrochlorothiazide 25 MG tablet Commonly known as:  HYDRODIURIL Take 25 mg by mouth daily.   mirtazapine 15 MG tablet Commonly known as:  REMERON Take 7.5 mg by mouth at bedtime. 1/2 tab   multivitamin with minerals Tabs tablet Take 1 tablet by mouth daily.   neomycin-bacitracin-polymyxin [phone] ointment Apply 1 application topically to left leg wound every day and cover with foam dressing.   potassium chloride 10 MEQ tablet Commonly known as:  K-DUR,KLOR-CON Take 10 mEq by mouth daily.       Review of Systems  Constitutional: Positive for fatigue. Negative for activity change, appetite change, chills, diaphoresis and fever.  HENT: Negative for congestion, sneezing, sore throat, trouble swallowing and voice change.   Respiratory: Negative for apnea, cough, choking, chest tightness, shortness of breath and wheezing.   Cardiovascular: Negative for chest pain, palpitations and leg swelling.  Gastrointestinal: Negative for abdominal distention, abdominal pain, constipation, diarrhea and nausea.  Genitourinary: Negative for difficulty urinating, dysuria, frequency and urgency.  Musculoskeletal: Negative for back pain, gait problem and myalgias. Arthralgias: typical arthritis.  Skin: Negative for color change, pallor, rash and wound.  Neurological: Negative for dizziness, tremors, syncope, speech difficulty, weakness, numbness and headaches.  Psychiatric/Behavioral: Negative for agitation and behavioral problems.  All other systems reviewed and are negative.   Immunization History  Administered Date(s) Administered  . Influenza Split Dec 30, 202015  . Influenza, High Dose Seasonal PF 02/23/2016  . Pneumococcal Conjugate-13 02/23/2016  . Pneumococcal Polysaccharide-23 01/10/2015   Pertinent  Health Maintenance Due  Topic Date Due  . FOOT  EXAM  01/11/1941  . OPHTHALMOLOGY EXAM  01/11/1941  . DEXA SCAN  01/10/1998  . HEMOGLOBIN A1C  10/24/2016  . INFLUENZA VACCINE  01/04/2017  . PNA vac Low Risk Adult  Completed   Fall Risk  09/14/2015 09/14/2015 03/16/2015 03/16/2015  Falls in the past year? No No Yes No  Number falls in past yr: - - 1 -  Injury with Fall? - - Yes -  Risk for fall due to : Impaired balance/gait - - -  Follow up - - Education provided;Falls prevention discussed -   Functional Status Survey:    Vitals:   01/30/17 1358  BP: 110/66  Pulse: 68  Resp: 20  Temp: 98 F (36.7 C)  SpO2: 99%  Weight: 108 lb 8 oz (49.2 kg)  Height: 5\' 2"  (1.575 m)   Body mass index is 19.84 kg/m. Physical Exam  Constitutional: She is oriented to person, place, and time. Vital signs are normal. She appears well-developed and well-nourished. She is active and cooperative. She does not appear ill. No distress.  HENT:  Head: Normocephalic and atraumatic.  Mouth/Throat: Uvula is midline, oropharynx is clear and moist and mucous membranes are normal. Mucous membranes are not pale, not dry and not cyanotic.  Eyes: Pupils are equal, round, and reactive to light. Conjunctivae, EOM and lids are normal.  Neck: Trachea normal, normal range of motion and full passive range of motion without pain. Neck supple. No JVD present. No tracheal deviation, no edema and no erythema present. No thyromegaly present.  Cardiovascular: Normal rate, regular rhythm, normal heart sounds, intact distal pulses and normal pulses.  Exam reveals no gallop, no distant heart sounds and no friction rub.   No murmur heard. Pulmonary/Chest: Effort normal and breath sounds normal. No accessory muscle usage. No respiratory distress. She has no wheezes. She has no rales. She exhibits no tenderness.  Abdominal: Normal appearance and bowel sounds are normal. She exhibits no distension and no ascites. There is no tenderness.  Musculoskeletal: Normal range of motion. She  exhibits no edema or tenderness.  Expected osteoarthritis, stiffness  Neurological: She is alert and oriented to person, place, and time. She has normal strength.  Skin: Skin is warm, dry and intact. No rash noted. She is not diaphoretic. No cyanosis or erythema. No pallor. Nails show no clubbing.  Psychiatric: She has a normal mood and affect. Her speech is normal and behavior is normal. Judgment and thought content normal. Cognition and memory are impaired.  Nursing note and vitals reviewed.   Labs reviewed:  Recent Labs  12/27/16 2105 12/28/16 0454 12/29/16 0322  NA 140 139 138  K 3.6 3.2* 3.6  CL 105 105 105  CO2 25 25 25   GLUCOSE 99 91 98  BUN 21* 18 14  CREATININE 1.02* 0.72 0.93  CALCIUM 9.7 9.0 8.9    Recent Labs  04/25/16 2020 12/27/16 2105  AST 29 22  ALT 24 15  ALKPHOS 80 90  BILITOT 0.5 0.8  PROT 7.4 7.5  ALBUMIN 4.0 4.0    Recent Labs  08/01/16 1507 12/27/16 2105 12/28/16 0454  WBC 3.6 3.4* 3.6  HGB 12.7 13.3 12.6  HCT 38.1 40.6 38.2  MCV 83.8 82.9 84.1  PLT 212 192 175   No results found for: TSH Lab Results  Component Value Date   HGBA1C 6.0 (H) 04/26/2016   Lab Results  Component Value Date   CHOL 150 04/26/2016   HDL 59 04/26/2016   LDLCALC 81 04/26/2016   TRIG 51 04/26/2016   CHOLHDL 2.5 04/26/2016    Significant Diagnostic Results in last 30 days:  No results found.  Assessment/Plan 1. Hypoglycemia  DC Metformin  Monitor BGs closely  Provide snacks as appropriate  Family/ staff Communication:   Total Time:  Documentation:  Face to Face:  Family/Phone:   Labs/tests ordered:    Medication list reviewed and assessed for continued appropriateness.  Brynda Rim, NP-C Geriatrics [facility] [phone] [address] [place] [place], [place] [address] Cell Phone (Mon-Fri 8am-5pm):  [phone] On Call:  [phone] & follow prompts after 5pm & weekends Office Phone:  [phone] Office  Fax:  [phone]

## 2017-02-04 ENCOUNTER — Encounter
Admission: RE | Admit: 2017-02-04 | Discharge: 2017-02-04 | Disposition: A | Discharge status: Home / Self Care | Payer: Medicare HMO | Source: Ambulatory Visit | Attending: Internal Medicine | Admitting: Internal Medicine

## 2017-02-07 IMAGING — CT CT ANGIO HEAD
1 of 10 series · 5 of 47 positions shown · IV contrast (omnipaque)
Comparison: CT head performed 01/08/2015. MR head in the remote
past 01/14/2004.

CLINICAL DATA: 83 y.o. female with history of hypertension,
diabetes mellitus, congestive heart failure, previous stroke,
permanent pacemaker presenting with gazing to the right,
unresponsiveness, and left hemiparesis. She received IV t-PA 52 mg
on 01/08/2015 at 1128.



[Series 5: carotid/brain 2.0 i30f 3 · axial · 0.48mm/px · z∈[-251,-39]mm · 5 of 159 slices shown]
[im 27/159  brain]
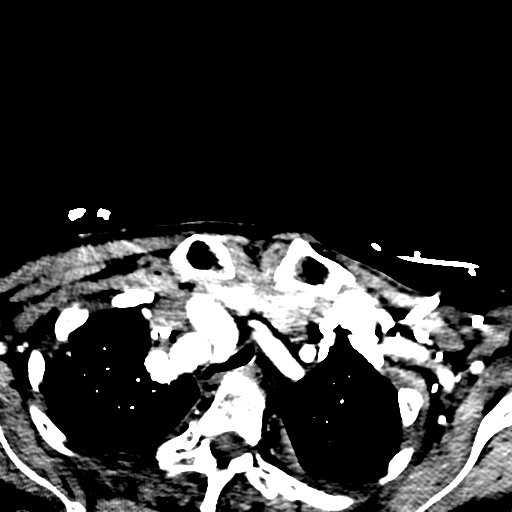
[im 53/159  bone]
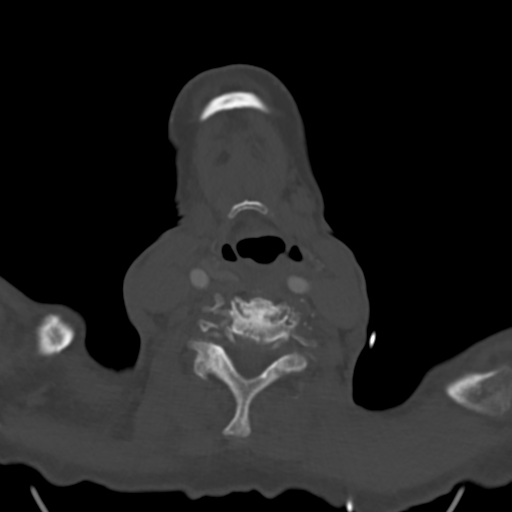
[im 80/159  brain]
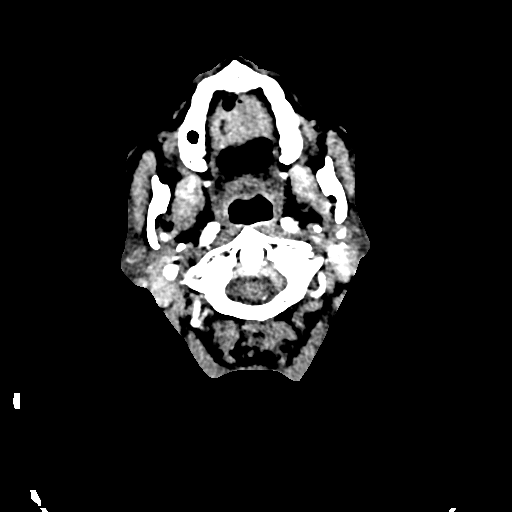
[im 106/159  bone]
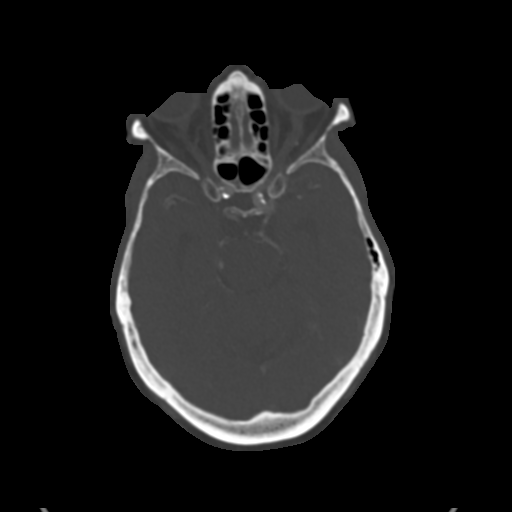
[im 132/159  brain]
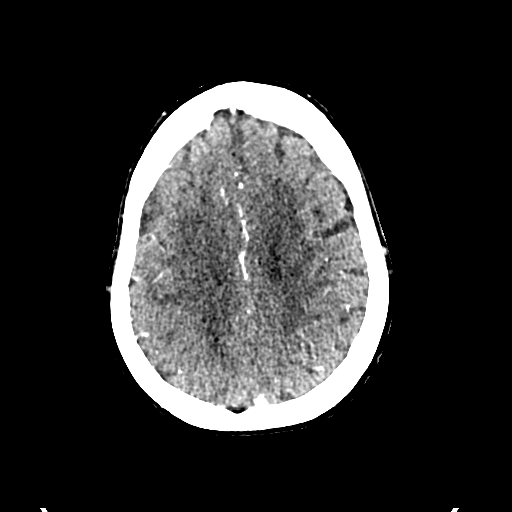

[5 of 47 positions shown; findings below may reference images not displayed]

FINDINGS: CT HEAD

Calvarium and skull base: No fracture or destructive lesion.
Mastoids and middle ears are grossly clear.

Paranasal sinuses: Imaged portions are clear.

Orbits: Negative.

Brain: Developing cytotoxic edema in the RIGHT posterior frontal
precentral gyrus seen best on postcontrast images 21 and 22 series
13 consistent with a small distal embolic infarction in this patient
with atrial fibrillation. Atrophy consistent with patient's advanced
age. Extensive small vessel disease.

CTA NECK

Aortic arch: Standard branching. Unusual swirling pattern of
contrast in the transverse arch and proximal innominate without
evidence for dissection. Some of these areas appear linear although
intimal disruption seems unlikely. I favor this abnormality
representing swirling blood, perhaps accentuated due to the
patient's atrial fibrillation.

Right carotid system: Heavily calcified plaque at the bifurcation.
No evidence of dissection, stenosis (50% or greater) or occlusion.

Left carotid system: Heavily calcified plaque at the bifurcation. No
evidence of dissection, stenosis (50% or greater) or occlusion.

Vertebral arteries: Codominant. No evidence of dissection, stenosis
(50% or greater) or occlusion.

Soft tissues: Spondylosis. Thyroid gland enlargement with
heterogeneous enhancement, and slight RIGHT-to-LEFT tracheal
displacement consistent with goiter. Slight substernal extension.
Pacemaker. BILATERAL effusions and vascular congestion consistent
with CHF, RIGHT greater than LEFT. No worrisome osseous lesions.
Airway midline.

CTA HEAD

Anterior circulation: Non stenotic calcified plaque in the carotid
siphons. No flow reducing lesion of the A1 or M1 segments. No
significant stenosis, proximal occlusion, aneurysm, or vascular
malformation. No observable RIGHT MCA branch occlusion.

Posterior circulation: Small basilar artery in part due to fetal PCA
origins, but superimposed diffuse disease with a focal mid basilar
stenosis 50-75%. Distal RIGHT vertebral stenosis, V4 segment,
50-75%. No proximal occlusion, aneurysm, or vascular malformation.

Venous sinuses: As permitted by contrast timing, patent.

Anatomic variants: BILATERAL fetal PCA origins.

Delayed phase:   No abnormal intracranial enhancement.
IMPRESSION: Developing cytotoxic edema and the RIGHT posterior frontal
precentral gyrus consistent with a small distal embolic infarction.

Intracranial atherosclerotic change affecting predominantly the
posterior circulation with distal RIGHT vertebral and mid basilar
stenoses potentially flow reducing.

No large vessel anterior circulation occlusion or observable
proximal MCA branch occlusion.

Unusual swirling appearance of contrast in the transverse arch and
proximal anomaly, without signs of dissection. Favor artifact due to
mixing. If further investigation is desired however, consider CTA
chest using dissection protocol.

## 2017-02-20 ENCOUNTER — Non-Acute Institutional Stay (SKILLED_NURSING_FACILITY): Payer: Medicare HMO | Admitting: Gerontology

## 2017-02-20 ENCOUNTER — Encounter: Payer: Self-pay | Admitting: Gerontology

## 2017-02-20 DIAGNOSIS — Z950 Presence of cardiac pacemaker: Secondary | ICD-10-CM | POA: Diagnosis not present

## 2017-02-20 DIAGNOSIS — I110 Hypertensive heart disease with heart failure: Secondary | ICD-10-CM | POA: Diagnosis not present

## 2017-02-20 DIAGNOSIS — R531 Weakness: Secondary | ICD-10-CM | POA: Diagnosis not present

## 2017-02-20 DIAGNOSIS — I480 Paroxysmal atrial fibrillation: Secondary | ICD-10-CM

## 2017-02-20 DIAGNOSIS — H40153 Residual stage of open-angle glaucoma, bilateral: Secondary | ICD-10-CM | POA: Diagnosis not present

## 2017-02-20 DIAGNOSIS — I081 Rheumatic disorders of both mitral and tricuspid valves: Secondary | ICD-10-CM

## 2017-02-20 DIAGNOSIS — E1142 Type 2 diabetes mellitus with diabetic polyneuropathy: Secondary | ICD-10-CM | POA: Diagnosis not present

## 2017-02-20 DIAGNOSIS — E559 Vitamin D deficiency, unspecified: Secondary | ICD-10-CM | POA: Diagnosis not present

## 2017-02-20 DIAGNOSIS — Z9181 History of falling: Secondary | ICD-10-CM

## 2017-02-20 DIAGNOSIS — E876 Hypokalemia: Secondary | ICD-10-CM

## 2017-02-20 DIAGNOSIS — Z8673 Personal history of transient ischemic attack (TIA), and cerebral infarction without residual deficits: Secondary | ICD-10-CM

## 2017-02-20 DIAGNOSIS — I5022 Chronic systolic (congestive) heart failure: Secondary | ICD-10-CM

## 2017-02-20 DIAGNOSIS — E785 Hyperlipidemia, unspecified: Secondary | ICD-10-CM

## 2017-02-20 NOTE — Progress Notes (Signed)
Location:   [facility] Room Number: 458P Place of Service:  SNF [phone]) Provider:  Toni Arthurs, NP-C  Madelyn Brunner, MD  Patient Care Team: Madelyn Brunner, MD as PCP - General (Internal Medicine) Dannielle Karvonen, RN as [facility] Management  Extended Emergency Contact Information Primary Emergency Contact: Cyndee Brightly Address: [address]          [place], [place] [address] Johnnette Litter of [place] Phone: [phone] Relation: Daughter Secondary Emergency Contact: Delfin Gant [place] of [place] Phone: [phone] Relation: Son  Code Status:  DNR Goals of care: Advanced Directive information Advanced Directives 02/20/2017  Does Patient Have a Medical Advance Directive? Yes  Type of Advance Directive Out of facility DNR (pink MOST or yellow form)  Does patient want to make changes to medical advance directive? No - Patient declined  Copy of [place] in Chart? -  Would patient like information on creating a medical advance directive? -     Chief Complaint  Patient presents with  . Medical Management of Chronic Issues    Routine Visit    HPI:  Pt is a 83 y.o. female seen today for medical management of chronic issues. Pt was admitted to the facility for rehab for generalized weakness after admission to [facility] for UTI, weakness. She has since transitioned to [facility] resident. She is incontinent of B&B. Requires 1+ assistance for ADL's. She does get confused at times and tries to hit at staff. Poor appetite at times. Weight is stable. Typically eats between 50-100% of meals. Denies pain. Intermittent constipation. Sleeps soundly a lot of the time. No falls. VSS. No other complaints.     Past Medical History:  Diagnosis Date  . Aortic valve sclerosis   . Bradycardia   . Cardiac pacemaker in situ    St. Jude single chamber pacemaker model 1210 SN# [id number] with St Jude 1888tc RV  SN# [id number] both implanted on 04/26/2011  . CHF (congestive heart failure) ([place])   . CVA (cerebral vascular accident) ([place])    Right sided weakness  . Diabetes mellitus without complication ([place])   . Dyspnea, unspecified   . Heart block    intermittent 3rd degree heart block with ventricular escape rate of approximatley 38bpm  . HFrEF (heart failure with reduced ejection fraction) ([place])   . History of chicken pox   . History of echocardiogram 03/07/2011   Normal LV size/function. Moderate aortic root dilation, Moderate MR. Severe TR. PASP 39mHg.  .Marland KitchenHyperlipidemia, unspecified   . Hypertension   . Osteoarthritis   . Pacemaker   . Paroxysmal atrial fibrillation ([place]    Documented in August 2016 at the time of CVA  . Stroke ([facility] 2005   Past Surgical History:  Procedure Laterality Date  . ABDOMINAL HYSTERECTOMY    . INSERT REPLACE REMOVE PACEMAKER    . JOINT REPLACEMENT Bilateral    hips  . LUMBAR FUSION  1996  . PACEMAKER PLACEMENT Left    DUAL CHAMBER PACEMAKER GENERATOR  . TOTAL KNEE ARTHROPLASTY Right 08/10/2012   Procedure: RIGHT TOTAL KNEE ARTHROPLASTY; Surgeon: PZorita Pang MD; Location: [facility] Service: Orthopedics; Laterality: Right;    No Known Allergies  Allergies as of 02/20/2017   No Known Allergies     Medication List       Accurate as of 02/20/17 11:33 AM. Always use your most recent med list.  acetaminophen 500 MG tablet Commonly known as:  TYLENOL Take 500 mg by mouth every 4 (four) hours as needed for mild pain or fever.   amLODipine-benazepril 10-20 MG capsule Commonly known as:  LOTREL Take 1 capsule by mouth daily.   apixaban 2.5 MG Tabs tablet Commonly known as:  ELIQUIS Take 1 tablet (2.5 mg total) by mouth 2 (two) times daily.   atorvastatin 20 MG tablet Commonly known as:  LIPITOR Take 1 tablet (20 mg total) by mouth daily at 6 PM.   bimatoprost 0.01 % Soln Commonly known as:  LUMIGAN Place 1 drop into both  eyes at bedtime.   carvedilol 6.25 MG tablet Commonly known as:  COREG Take 1 tablet (6.25 mg total) by mouth 2 (two) times daily with a meal.   cholecalciferol 1000 units tablet Commonly known as:  VITAMIN D Take 2,000 Units by mouth daily.   hydrochlorothiazide 25 MG tablet Commonly known as:  HYDRODIURIL Take 25 mg by mouth daily.   mirtazapine 15 MG tablet Commonly known as:  REMERON Take 7.5 mg by mouth at bedtime. 1/2 tab   multivitamin with minerals Tabs tablet Take 1 tablet by mouth daily.   neomycin-bacitracin-polymyxin [phone] ointment Apply 1 application topically to left leg wound every day and cover with foam dressing.   potassium chloride 10 MEQ tablet Commonly known as:  K-DUR,KLOR-CON Take 10 mEq by mouth daily.       Review of Systems  Constitutional: Positive for fatigue. Negative for activity change, appetite change, chills, diaphoresis and fever.  HENT: Negative for congestion, sneezing, sore throat, trouble swallowing and voice change.   Respiratory: Negative for apnea, cough, choking, chest tightness, shortness of breath and wheezing.   Cardiovascular: Negative for chest pain, palpitations and leg swelling.  Gastrointestinal: Negative for abdominal distention, abdominal pain, constipation, diarrhea and nausea.  Genitourinary: Negative for difficulty urinating, dysuria, frequency and urgency.  Musculoskeletal: Negative for back pain, gait problem and myalgias. Arthralgias: typical arthritis.  Skin: Negative for color change, pallor, rash and wound.  Neurological: Negative for dizziness, tremors, syncope, speech difficulty, weakness, numbness and headaches.  Psychiatric/Behavioral: Negative for agitation and behavioral problems.  All other systems reviewed and are negative.   Immunization History  Administered Date(s) Administered  . Influenza Split 04/08/2014  . Influenza, High Dose Seasonal PF 02/23/2016  . Pneumococcal Conjugate-13 02/23/2016   . Pneumococcal Polysaccharide-23 01/10/2015   Pertinent  Health Maintenance Due  Topic Date Due  . FOOT EXAM  01/11/1941  . OPHTHALMOLOGY EXAM  01/11/1941  . DEXA SCAN  01/10/1998  . INFLUENZA VACCINE  03/06/2017 (Originally 01/04/2017)  . HEMOGLOBIN A1C  Sep 26, 202018  . PNA vac Low Risk Adult  Completed   Fall Risk  09/14/2015 09/14/2015 03/16/2015 03/16/2015  Falls in the past year? No No Yes No  Number falls in past yr: - - 1 -  Injury with Fall? - - Yes -  Risk for fall due to : Impaired balance/gait - - -  Follow up - - Education provided;Falls prevention discussed -   Functional Status Survey:    Vitals:   02/20/17 1120  BP: 132/84  Pulse: 82  Resp: 16  Temp: 98 F (36.7 C)  SpO2: 97%  Weight: 110 lb (49.9 kg)  Height: 5' 2"  (1.575 m)   Body mass index is 20.12 kg/m. Physical Exam  Constitutional: She is oriented to person, place, and time. Vital signs are normal. She appears well-developed and well-nourished. She is active and cooperative. She does  not appear ill. No distress.  HENT:  Head: Normocephalic and atraumatic.  Mouth/Throat: Uvula is midline, oropharynx is clear and moist and mucous membranes are normal. Mucous membranes are not pale, not dry and not cyanotic.  Eyes: Pupils are equal, round, and reactive to light. Conjunctivae, EOM and lids are normal.  Neck: Trachea normal, normal range of motion and full passive range of motion without pain. Neck supple. No JVD present. No tracheal deviation, no edema and no erythema present. No thyromegaly present.  Cardiovascular: Normal rate, regular rhythm, normal heart sounds, intact distal pulses and normal pulses.  Exam reveals no gallop, no distant heart sounds and no friction rub.   No murmur heard. Pulmonary/Chest: Effort normal and breath sounds normal. No accessory muscle usage. No respiratory distress. She has no wheezes. She has no rales. She exhibits no tenderness.  Abdominal: Normal appearance and bowel  sounds are normal. She exhibits no distension and no ascites. There is no tenderness.  Musculoskeletal: Normal range of motion. She exhibits no edema or tenderness.  Expected osteoarthritis, stiffness  Neurological: She is alert and oriented to person, place, and time. She has normal strength.  Skin: Skin is warm, dry and intact. No rash noted. She is not diaphoretic. No cyanosis or erythema. No pallor. Nails show no clubbing.  Psychiatric: She has a normal mood and affect. Her speech is normal and behavior is normal. Judgment and thought content normal. Cognition and memory are impaired.  Nursing note and vitals reviewed.   Labs reviewed:  Recent Labs  12/27/16 2105 12/28/16 0454 12/29/16 0322  NA 140 139 138  K 3.6 3.2* 3.6  CL 105 105 105  CO2 25 25 25   GLUCOSE 99 91 98  BUN 21* 18 14  CREATININE 1.02* 0.72 0.93  CALCIUM 9.7 9.0 8.9    Recent Labs  04/25/16 2020 12/27/16 2105  AST 29 22  ALT 24 15  ALKPHOS 80 90  BILITOT 0.5 0.8  PROT 7.4 7.5  ALBUMIN 4.0 4.0    Recent Labs  08/01/16 1507 12/27/16 2105 12/28/16 0454  WBC 3.6 3.4* 3.6  HGB 12.7 13.3 12.6  HCT 38.1 40.6 38.2  MCV 83.8 82.9 84.1  PLT 212 192 175   No results found for: TSH Lab Results  Component Value Date   HGBA1C 6.0 (H) 04/26/2016   Lab Results  Component Value Date   CHOL 150 04/26/2016   HDL 59 04/26/2016   LDLCALC 81 04/26/2016   TRIG 51 04/26/2016   CHOLHDL 2.5 04/26/2016    Significant Diagnostic Results in last 30 days:  No results found.  Assessment/Plan 1. Diabetic polyneuropathy associated with type 2 diabetes mellitus (HCC)  Stable  2. Residual stage of open-angle glaucoma, bilateral  Stable   Continue Lumigan (bimatoprost) drops; 0.01 %- 1 drop in each eye Q HS  3. Rheumatic disorders of both mitral and tricuspid valves  Stable   4. Paroxysmal atrial fibrillation (HCC)  Stable   Continue Eliquis 2.5 mg po BID  5. Hypertensive heart disease with heart  failure (HCC)  Stable   Continue amlodipine-benazepril capsule; 10-20 mg- 1 capsule po Q Day  Continue Carvedilol 6.25 mg po BID  6. Chronic systolic (congestive) heart failure (HCC)  Stable   Conintue HCTZ 25 mg po Q Day  7. Hyperlipidemia, unspecified hyperlipidemia type  Stable   Continue Atorvastatin 20 mg po Q Day  8. Personal history of transient ischemic attack (TIA), and cerebral infarction without residual deficits  Stable  9. Presence of cardiac pacemaker  Stable   10. Generalized weakness 11. Risk for falls  Stable   Continue PT/OT  Continue exercises as taught by PT/OT  Encourage PO intake  Safety precautions   12. Vitamin D deficiency  Stable   Continue Cholecalciferol 2,000 units po Q Day  Continue MVI 1 tablet po Q Day  13. Hypokalemia  Stable   Continue Potassium chloride 10 meq po Q Day   Family/ staff Communication:   Total Time:  Documentation:  Face to Face:  Family/Phone:   Labs/tests ordered:  Cbc, met c  Medication list reviewed and assessed for continued appropriateness. Monthly medication orders reviewed and signed.  Vikki Ports, NP-C [facility] [phone], [place] [address] Cell Phone (Mon-Fri 8am-5pm):  [phone] On Call:  [phone] & follow prompts after 5pm & weekends Office Phone:  [phone] Office Fax:  [phone]

## 2017-03-02 ENCOUNTER — Other Ambulatory Visit
Admission: RE | Admit: 2017-03-02 | Discharge: 2017-03-02 | Disposition: A | Discharge status: Home / Self Care | Payer: Medicare HMO | Source: Skilled Nursing Facility | Attending: Internal Medicine | Admitting: Internal Medicine

## 2017-03-02 DIAGNOSIS — I110 Hypertensive heart disease with heart failure: Secondary | ICD-10-CM | POA: Insufficient documentation

## 2017-03-02 DIAGNOSIS — R8299 Other abnormal findings in urine: Secondary | ICD-10-CM | POA: Diagnosis not present

## 2017-03-02 DIAGNOSIS — E559 Vitamin D deficiency, unspecified: Secondary | ICD-10-CM | POA: Insufficient documentation

## 2017-03-02 DIAGNOSIS — I081 Rheumatic disorders of both mitral and tricuspid valves: Secondary | ICD-10-CM | POA: Insufficient documentation

## 2017-03-02 DIAGNOSIS — H40153 Residual stage of open-angle glaucoma, bilateral: Secondary | ICD-10-CM | POA: Insufficient documentation

## 2017-03-02 DIAGNOSIS — Z8673 Personal history of transient ischemic attack (TIA), and cerebral infarction without residual deficits: Secondary | ICD-10-CM | POA: Insufficient documentation

## 2017-03-02 DIAGNOSIS — I5022 Chronic systolic (congestive) heart failure: Secondary | ICD-10-CM | POA: Insufficient documentation

## 2017-03-02 DIAGNOSIS — Z950 Presence of cardiac pacemaker: Secondary | ICD-10-CM | POA: Insufficient documentation

## 2017-03-02 LAB — URINALYSIS, COMPLETE (UACMP) WITH MICROSCOPIC
Bilirubin Urine: NEGATIVE
GLUCOSE, UA: NEGATIVE mg/dL
HGB URINE DIPSTICK: NEGATIVE
Ketones, ur: NEGATIVE mg/dL
NITRITE: POSITIVE — AB
PH: 6 (ref 5.0–8.0)
Protein, ur: NEGATIVE mg/dL
SPECIFIC GRAVITY, URINE: 1.02 (ref 1.005–1.030)
Squamous Epithelial / LPF: NONE SEEN

## 2017-03-05 LAB — URINE CULTURE: Culture: >=100000 — AB

## 2017-03-06 ENCOUNTER — Encounter
Admission: RE | Admit: 2017-03-06 | Discharge: 2017-03-06 | Disposition: A | Discharge status: Home / Self Care | Payer: Medicare HMO | Source: Ambulatory Visit | Attending: Internal Medicine | Admitting: Internal Medicine

## 2017-03-10 ENCOUNTER — Encounter: Payer: Self-pay | Admitting: Gerontology

## 2017-03-10 ENCOUNTER — Emergency Department
Admission: EM | Admit: 2017-03-10 | Discharge: 2017-03-10 | Disposition: A | Discharge status: Home / Self Care | Payer: Medicare HMO | Attending: Emergency Medicine | Admitting: Emergency Medicine

## 2017-03-10 ENCOUNTER — Emergency Department: Payer: Medicare HMO

## 2017-03-10 ENCOUNTER — Non-Acute Institutional Stay (SKILLED_NURSING_FACILITY): Payer: Medicare HMO | Admitting: Gerontology

## 2017-03-10 DIAGNOSIS — I110 Hypertensive heart disease with heart failure: Secondary | ICD-10-CM | POA: Diagnosis not present

## 2017-03-10 DIAGNOSIS — Z79899 Other long term (current) drug therapy: Secondary | ICD-10-CM | POA: Insufficient documentation

## 2017-03-10 DIAGNOSIS — Z8673 Personal history of transient ischemic attack (TIA), and cerebral infarction without residual deficits: Secondary | ICD-10-CM | POA: Diagnosis not present

## 2017-03-10 DIAGNOSIS — R05 Cough: Secondary | ICD-10-CM | POA: Diagnosis not present

## 2017-03-10 DIAGNOSIS — I5022 Chronic systolic (congestive) heart failure: Secondary | ICD-10-CM | POA: Insufficient documentation

## 2017-03-10 DIAGNOSIS — Z87891 Personal history of nicotine dependence: Secondary | ICD-10-CM | POA: Diagnosis not present

## 2017-03-10 DIAGNOSIS — Z7901 Long term (current) use of anticoagulants: Secondary | ICD-10-CM | POA: Diagnosis not present

## 2017-03-10 DIAGNOSIS — R404 Transient alteration of awareness: Secondary | ICD-10-CM | POA: Diagnosis not present

## 2017-03-10 DIAGNOSIS — I2510 Atherosclerotic heart disease of native coronary artery without angina pectoris: Secondary | ICD-10-CM | POA: Insufficient documentation

## 2017-03-10 DIAGNOSIS — Z743 Need for continuous supervision: Secondary | ICD-10-CM | POA: Diagnosis not present

## 2017-03-10 DIAGNOSIS — E119 Type 2 diabetes mellitus without complications: Secondary | ICD-10-CM | POA: Diagnosis not present

## 2017-03-10 DIAGNOSIS — Z950 Presence of cardiac pacemaker: Secondary | ICD-10-CM | POA: Diagnosis not present

## 2017-03-10 DIAGNOSIS — R4182 Altered mental status, unspecified: Secondary | ICD-10-CM | POA: Diagnosis not present

## 2017-03-10 LAB — COMPREHENSIVE METABOLIC PANEL
ALK PHOS: 76 U/L (ref 38–126)
ALT: 27 U/L (ref 14–54)
ANION GAP: 9 (ref 5–15)
AST: 30 U/L (ref 15–41)
Albumin: 3.8 g/dL (ref 3.5–5.0)
BILIRUBIN TOTAL: 0.7 mg/dL (ref 0.3–1.2)
BUN: 33 mg/dL — ABNORMAL HIGH (ref 6–20)
CO2: 25 mmol/L (ref 22–32)
Calcium: 9.6 mg/dL (ref 8.9–10.3)
Chloride: 106 mmol/L (ref 101–111)
Creatinine, Ser: 0.92 mg/dL (ref 0.44–1.00)
GFR calc non Af Amer: 56 mL/min — ABNORMAL LOW (ref >60)
GLUCOSE: 94 mg/dL (ref 65–99)
Potassium: 3.6 mmol/L (ref 3.5–5.1)
Sodium: 140 mmol/L (ref 135–145)
TOTAL PROTEIN: 8 g/dL (ref 6.5–8.1)

## 2017-03-10 LAB — CBC
HCT: 39.8 % (ref 35.0–47.0)
Hemoglobin: 13.1 g/dL (ref 12.0–16.0)
MCH: 27.8 pg (ref 26.0–34.0)
MCHC: 32.8 g/dL (ref 32.0–36.0)
MCV: 84.7 fL (ref 80.0–100.0)
Platelets: 203 10*3/uL (ref 150–440)
RBC: 4.7 MIL/uL (ref 3.80–5.20)
RDW: 17.1 % — ABNORMAL HIGH (ref 11.5–14.5)
WBC: 3 10*3/uL — ABNORMAL LOW (ref 3.6–11.0)

## 2017-03-10 NOTE — Progress Notes (Signed)
Location:   [facility] Room Number: 319B Place of Service:  SNF [phone]) Provider:  Lorenso Quarry, NP-C  Rafael Bihari, MD  Patient Care Team: Rafael Bihari, MD as PCP - General (Internal Medicine) Otho Ket, RN as [facility] Management  Extended Emergency Contact Information Primary Emergency Contact: Tommie Raymond Address: [address]          [place], [place] [address] [place] Home Phone: [phone] Relation: Daughter Secondary Emergency Contact: Adria [place] Home Phone: [phone] Relation: Son  Code Status:  DNR Goals of care: Advanced Directive information Advanced Directives 03/10/2017  Does Patient Have a Medical Advance Directive? Yes  Type of Advance Directive Out of facility DNR (pink MOST or yellow form)  Does patient want to make changes to medical advance directive? No - Patient declined  Copy of Healthcare Power of Attorney in Chart? -  Would patient like information on creating a medical advance directive? -     Chief Complaint  Patient presents with  . Acute Visit    Follow up on altered mental status    HPI:  Pt is a 83 y.o. female seen today for an acute visit for altered mental status. Called urgently to the room by nursing. Pt was in activities and became less responsive. When pt arrived back to her room, she was able to open her eyes to voice, but would quickly have decreased responsiveness. Pt could not follow simple commands. Unable to follow 6 fields of gaze, unable to answer number of fingers in front of face. Would not open eyes to verbal command, but would open them when manually opened. Eyes appeared unable to focus. Negative blink reflex. Pt's head was unable to hold head up by herself, had to be manually held up. Pulse weak, irregular. Heat sounds distant. Diminished breath sounds throughout. Pt had vomited on her shirt. Legs cool to  touch. No edema. Pt slumped forward in wheelchair.     Past Medical History:  Diagnosis Date  . Aortic valve sclerosis   . Bradycardia   . Cardiac pacemaker in situ    St. Jude single chamber pacemaker model 1210 SN# [id number] with St Jude 1888tc RV SN# [id number] both implanted on 04/26/2011  . CHF (congestive heart failure) (HCC)   . CVA (cerebral vascular accident) (HCC)    Right sided weakness  . Diabetes mellitus without complication (HCC)   . Dyspnea, unspecified   . Heart block    intermittent 3rd degree heart block with ventricular escape rate of approximatley 38bpm  . HFrEF (heart failure with reduced ejection fraction) (HCC)   . History of chicken pox   . History of echocardiogram 03/07/2011   Normal LV size/function. Moderate aortic root dilation, Moderate MR. Severe TR. PASP .  Marland Kitchen Hyperlipidemia, unspecified   . Hypertension   . Osteoarthritis   . Pacemaker   . Paroxysmal atrial fibrillation (HCC)    Documented in August 2016 at the time of CVA  . Stroke [facility]) 2005   Past Surgical History:  Procedure Laterality Date  . ABDOMINAL HYSTERECTOMY    . INSERT REPLACE REMOVE PACEMAKER    . JOINT REPLACEMENT Bilateral    hips  . LUMBAR FUSION  1996  . PACEMAKER PLACEMENT Left    DUAL CHAMBER PACEMAKER GENERATOR  . TOTAL KNEE ARTHROPLASTY Right 08/10/2012   Procedure: RIGHT TOTAL KNEE ARTHROPLASTY; Surgeon: Christell Faith, MD; Location: [facility] OR;  Service: Orthopedics; Laterality: Right;    No Known Allergies  Allergies as of 03/10/2017   No Known Allergies     Medication List       Accurate as of 03/10/17 12:54 PM. Always use your most recent med list.          acetaminophen 500 MG tablet Commonly known as:  TYLENOL Take 500 mg by mouth every 4 (four) hours as needed for mild pain or fever.   amLODipine-benazepril 10-20 MG capsule Commonly known as:  LOTREL Take 1 capsule by mouth daily.   apixaban 2.5 MG Tabs tablet Commonly known as:   ELIQUIS Take 1 tablet (2.5 mg total) by mouth 2 (two) times daily.   atorvastatin 20 MG tablet Commonly known as:  LIPITOR Take 1 tablet (20 mg total) by mouth daily at 6 PM.   bimatoprost 0.01 % Soln Commonly known as:  LUMIGAN Place 1 drop into both eyes at bedtime.   carvedilol 6.25 MG tablet Commonly known as:  COREG Take 1 tablet (6.25 mg total) by mouth 2 (two) times daily with a meal.   cholecalciferol 1000 units tablet Commonly known as:  VITAMIN D Take 2,000 Units by mouth daily.   hydrochlorothiazide 25 MG tablet Commonly known as:  HYDRODIURIL Take 25 mg by mouth daily.   multivitamin with minerals Tabs tablet Take 1 tablet by mouth daily.   neomycin-bacitracin-polymyxin [phone] ointment Apply 1 application topically to left leg wound every day and cover with foam dressing.   potassium chloride 10 MEQ tablet Commonly known as:  K-DUR,KLOR-CON Take 10 mEq by mouth daily.       Review of Systems  Unable to perform ROS: Mental status change  Constitutional: Positive for fatigue. Negative for activity change, appetite change, chills, diaphoresis and fever.  HENT: Negative for congestion, sneezing, sore throat, trouble swallowing and voice change.   Respiratory: Negative for apnea, cough, choking, chest tightness, shortness of breath and wheezing.   Cardiovascular: Negative for chest pain, palpitations and leg swelling.  Gastrointestinal: Negative for abdominal distention, abdominal pain, constipation, diarrhea and nausea.  Genitourinary: Negative for difficulty urinating, dysuria, frequency and urgency.  Musculoskeletal: Negative for back pain, gait problem and myalgias. Arthralgias: typical arthritis.  Skin: Negative for color change, pallor, rash and wound.  Neurological: Positive for weakness. Negative for dizziness, tremors, seizures, syncope, facial asymmetry, speech difficulty, light-headedness, numbness and headaches.  Psychiatric/Behavioral: Negative  for agitation and behavioral problems.  All other systems reviewed and are negative.   Immunization History  Administered Date(s) Administered  . Influenza Split 04/08/2014  . Influenza, High Dose Seasonal PF 02/23/2016  . Influenza-Unspecified 02/28/2017  . Pneumococcal Conjugate-13 02/23/2016  . Pneumococcal Polysaccharide-23 01/10/2015   Pertinent  Health Maintenance Due  Topic Date Due  . FOOT EXAM  01/11/1941  . OPHTHALMOLOGY EXAM  01/11/1941  . DEXA SCAN  01/10/1998  . HEMOGLOBIN A1C  2020-07-816  . INFLUENZA VACCINE  Completed  . PNA vac Low Risk Adult  Completed   Fall Risk  09/14/2015 09/14/2015 03/16/2015 03/16/2015  Falls in the past year? No No Yes No  Number falls in past yr: - - 1 -  Injury with Fall? - - Yes -  Risk for fall due to : Impaired balance/gait - - -  Follow up - - Education provided;Falls prevention discussed -   Functional Status Survey:    Vitals:   03/10/17 1100  BP: 128/81  Pulse: 70  Resp: 16  Temp: 98.1 F (36.7 C)  SpO2: 95%  Weight: 112 lb (50.8 kg)  Height:  (1.575 m)   Body mass index is 20.49 kg/m. Physical Exam  Constitutional: She appears well-developed and well-nourished. She appears listless. She is active and cooperative. She appears ill. No distress.  HENT:  Head: Normocephalic and atraumatic.  Mouth/Throat: Uvula is midline, oropharynx is clear and moist and mucous membranes are normal. Mucous membranes are not pale, not dry and not cyanotic.  Eyes: Conjunctivae, EOM and lids are normal. Right pupil is not reactive. Left pupil is not reactive.  Neck: Trachea normal, normal range of motion and full passive range of motion without pain. Neck supple. No JVD present. No tracheal deviation, no edema and no erythema present. No thyromegaly present.  Cardiovascular: Normal rate, regular rhythm and intact distal pulses.  Exam reveals distant heart sounds. Exam reveals no gallop and no friction rub.   No murmur  heard. Pulses:      Dorsalis pedis pulses are 1+ on the right side, and 1+ on the left side.  No edema  Pulmonary/Chest: Effort normal. No accessory muscle usage. No respiratory distress. She has decreased breath sounds in the right upper field, the left upper field and the left middle field. She has no wheezes. She has rhonchi (faint) in the right lower field and the left lower field. She has no rales. She exhibits no tenderness.  Abdominal: Soft. Normal appearance. She exhibits no distension and no ascites. Bowel sounds are decreased. There is no tenderness.  Musculoskeletal: Normal range of motion. She exhibits no edema or tenderness.  Expected osteoarthritis, stiffness  Neurological: She has normal strength. She appears listless. A sensory deficit is present. She exhibits abnormal muscle tone. Coordination abnormal.  Skin: Skin is warm, dry and intact. No rash noted. She is not diaphoretic. No cyanosis or erythema. No pallor. Nails show no clubbing.  Psychiatric: She has a normal mood and affect. Her speech is normal and behavior is normal. Judgment and thought content normal. Cognition and memory are impaired.  Nursing note and vitals reviewed.   Labs reviewed:  Recent Labs  12/27/16 2105 12/28/16 0454 12/29/16 0322  NA 140 139 138  K 3.6 3.2* 3.6  CL 105 105 105  CO2 GLUCOSE 99 91 98  BUN 21* 18 14  CREATININE 1.02* 0.72 0.93  CALCIUM 9.7 9.0 8.9    Recent Labs  04/25/16 2020 12/27/16 2105  AST 29 22  ALT 24 15  ALKPHOS 80 90  BILITOT 0.5 0.8  PROT 7.4 7.5  ALBUMIN 4.0 4.0    Recent Labs  08/01/16 1507 12/27/16 2105 12/28/16 0454  WBC 3.6 3.4* 3.6  HGB 12.7 13.3 12.6  HCT 38.1 40.6 38.2  MCV 83.8 82.9 84.1  PLT 212 192 175   No results found for: TSH Lab Results  Component Value Date   HGBA1C 6.0 (H) 04/26/2016   Lab Results  Component Value Date   CHOL 150 04/26/2016   HDL 59 04/26/2016   LDLCALC 81 04/26/2016   TRIG 51 04/26/2016    CHOLHDL 2.5 04/26/2016    Significant Diagnostic Results in last 30 days:  No results found.  Assessment/Plan 1. Altered mental status, unspecified altered mental status type  Send to ED to eval and treat   Family/ staff Communication:   Total Time:  Documentation:  Face to Face:  Family/Phone:   Labs/tests ordered:    Medication list reviewed and assessed for continued appropriateness.  Brynda Rim, NP-C  [facility] [address], [place] [address] Cell Phone (Mon-Fri 8am-5pm):  [phone] On Call:  [phone] & follow prompts after 5pm & weekends Office Phone:  [phone] Office Fax:  [phone]

## 2017-03-10 NOTE — ED Notes (Signed)
Pt BP hypertensive at discharge, MD made aware; instructed to proceed with discharge.

## 2017-03-10 NOTE — ED Provider Notes (Signed)
[facility] Emergency Department Provider Note   ____________________________________________    I have reviewed the triage vital signs and the nursing notes.   HISTORY  Chief Complaint Altered Mental Status   Patient is unable to give significant history  HPI Tamara Campbell is a 83 y.o. female With significant past medical history who presents from nursing home after period of unresponsiveness. Per EMS the patient stopped making with staff and they became worried that she was having a stroke. Patient has a history of CVA. Daughter has arrived and says that this is a new environment for her and that this happens to her sometimes when she "just shuts down". Daughter says that she is acting normally currently. And indeed with daughter here the patient is interacting much better   Past Medical History:  Diagnosis Date  . Aortic valve sclerosis   . Bradycardia   . Cardiac pacemaker in situ    St. Jude single chamber pacemaker model 1210 SN# [id number] with St Jude 1888tc RV SN# [id number] both implanted on 04/26/2011  . CHF (congestive heart failure) (HCC)   . CVA (cerebral vascular accident) (HCC)    Right sided weakness  . Diabetes mellitus without complication (HCC)   . Dyspnea, unspecified   . Heart block    intermittent 3rd degree heart block with ventricular escape rate of approximatley 38bpm  . HFrEF (heart failure with reduced ejection fraction) (HCC)   . History of chicken pox   . History of echocardiogram 03/07/2011   Normal LV size/function. Moderate aortic root dilation, Moderate MR. Severe TR. PASP .  Marland Kitchen Hyperlipidemia, unspecified   . Hypertension   . Osteoarthritis   . Pacemaker   . Paroxysmal atrial fibrillation (HCC)    Documented in August 2016 at the time of CVA  . Stroke [facility]) 2005    Patient Active Problem List   Diagnosis Date Noted  . Residual stage of open-angle glaucoma, bilateral 03/02/2017  . Rheumatic disorders of  both mitral and tricuspid valves 03/02/2017  . Hypertensive heart disease with heart failure (HCC) 03/02/2017  . Chronic systolic (congestive) heart failure (HCC) 03/02/2017  . Personal history of transient ischemic attack (TIA), and cerebral infarction without residual deficits 03/02/2017  . Presence of cardiac pacemaker 03/02/2017  . Vitamin D deficiency 03/02/2017  . Generalized weakness 12/29/2016  . Risk for falls 03/16/2015  . Hyperlipidemia 01/13/2015  . Diabetes mellitus with polyneuropathy (HCC) 01/13/2015  . Hypokalemia 01/13/2015  . Cerebral infarction due to embolism of middle cerebral artery (HCC)   . Paroxysmal atrial fibrillation (HCC) 01/10/2015  . Cardiomyopathy (HCC) 01/10/2015    Past Surgical History:  Procedure Laterality Date  . ABDOMINAL HYSTERECTOMY    . INSERT REPLACE REMOVE PACEMAKER    . JOINT REPLACEMENT Bilateral    hips  . LUMBAR FUSION  1996  . PACEMAKER PLACEMENT Left    DUAL CHAMBER PACEMAKER GENERATOR  . TOTAL KNEE ARTHROPLASTY Right 08/10/2012   Procedure: RIGHT TOTAL KNEE ARTHROPLASTY; Surgeon: Christell Faith, MD; Location: [facility] OR; Service: Orthopedics; Laterality: Right;    Prior to Admission medications   Medication Sig Start Date End Date Taking? Authorizing Provider  acetaminophen (TYLENOL) 500 MG tablet Take 500 mg by mouth every 4 (four) hours as needed for mild pain or fever.    [provider]  amLODipine-benazepril (LOTREL) 10-20 MG capsule Take 1 capsule by mouth daily.     [provider]  apixaban (ELIQUIS) 2.5 MG TABS tablet Take 1  tablet (2.5 mg total) by mouth 2 (two) times daily. 12/30/16   Auburn Bilberry, MD  atorvastatin (LIPITOR) 20 MG tablet Take 1 tablet (20 mg total) by mouth daily at 6 PM. 01/13/15   Layne Benton, NP  bimatoprost (LUMIGAN) 0.01 % SOLN Place 1 drop into both eyes at bedtime.     [provider]  carvedilol (COREG) 6.25 MG tablet Take 1 tablet (6.25 mg total) by mouth 2  (two) times daily with a meal. 01/13/15   Layne Benton, NP  cholecalciferol (VITAMIN D) 1000 UNITS tablet Take 2,000 Units by mouth daily.    [provider]  hydrochlorothiazide (HYDRODIURIL) 25 MG tablet Take 25 mg by mouth daily.     [provider]  Multiple Vitamin (MULTIVITAMIN WITH MINERALS) TABS tablet Take 1 tablet by mouth daily.    [provider]  neomycin-bacitracin-polymyxin (NEOSPORIN) [phone] ointment Apply 1 application topically to left leg wound every day and cover with foam dressing.    [provider]  potassium chloride (K-DUR,KLOR-CON) 10 MEQ tablet Take 10 mEq by mouth daily.    [provider]     Allergies Patient has no known allergies.  Family History  Problem Relation Age of Onset  . Cervical cancer Mother   . Stroke Sister   . Diabetes type II Brother     Social History Social History  Substance Use Topics  . Smoking status: Former Games developer  . Smokeless tobacco: Never Used  . Alcohol use No    L5 caveat: Unable to obtain Review of Systems due to dementia     ____________________________________________   PHYSICAL EXAM:  VITAL SIGNS: ED Triage Vitals  Enc Vitals Group     BP 03/10/17 1330 (!) 155/108     Pulse Rate 03/10/17 1330 73     Resp 03/10/17 1330 15     Temp 03/10/17 1327 (P) 98.1 F (36.7 C)     Temp Source 03/10/17 1327 (P) Oral     SpO2 03/10/17 1330 96 %     Weight 03/10/17 1331 58.1 kg (128 lb)     Height 03/10/17 1331 1.575 m ( )     Head Circumference --      Peak Flow --      Pain Score --      Pain Loc --      Pain Edu? --      Excl. in GC? --     Constitutional: Alert. No acute distress. Pleasant and interactive Eyes: Conjunctivae are normal.  Head: Atraumatic. Nose: No congestion/rhinnorhea. Mouth/Throat: Mucous membranes are moist.   Neck: no vertebral tenderness to palpation Cardiovascular: Normal rate, regular rhythm.  Good peripheral  circulation. Respiratory: Normal respiratory effort.  Lungs CTAB. Gastrointestinal: Soft and nontender. No distention.  No CVA tenderness. Genitourinary: deferred Musculoskeletal:  Warm and well perfused Neurologic:  Normal speech and language. No gross focal neurologic deficits are appreciated.  Skin:  Skin is warm, dry and intact. No rash noted.   ____________________________________________   LABS (all labs ordered are listed, but only abnormal results are displayed)  Labs Reviewed  CBC - Abnormal; Notable for the following:       Result Value   WBC 3.0 (*)    RDW 17.1 (*)    All other components within normal limits  COMPREHENSIVE METABOLIC PANEL - Abnormal; Notable for the following:    BUN 33 (*)    GFR calc non Af Amer 56 (*)    All  other components within normal limits   ____________________________________________  EKG  None ____________________________________________  RADIOLOGY  Chest x-ray unremarkable ____________________________________________   PROCEDURES  Procedure(s) performed: No    Critical Care performed:No ____________________________________________   INITIAL IMPRESSION / ASSESSMENT AND PLAN / ED COURSE  Pertinent labs & imaging results that were available during my care of the patient were reviewed by me and considered in my medical decision making (see chart for details).  Patient presents after a period of unresponsiveness. However she appears to be at her baseline now both physically and mentally  Daughter is frustrated that she was sent to the emergency department and does not want an extensive workup done as she feels the patient is at her baseline and the patient has had multiple workups in the past. I discussed this with her and we agreed to do labs and a chest x-ray.  Labs and chest x-ray are normal. Patient is interacting well with her daughter. I will discharge her back to facility       ____________________________________________   FINAL CLINICAL IMPRESSION(S) / ED DIAGNOSES  Final diagnoses:  Transient alteration of awareness      NEW MEDICATIONS STARTED DURING THIS VISIT:  New Prescriptions   No medications on file     Note:  This document was prepared using Dragon voice recognition software and may include unintentional dictation errors.    Jene Every, MD 03/10/17 [phone]

## 2017-03-10 NOTE — ED Notes (Signed)
Called for transport by EMS to [facility]  [phone]

## 2017-03-10 NOTE — ED Triage Notes (Signed)
Pt to ED via [facility] from [facility] c/o altered mental status. EMS reports staff at [facility] reported that pt became unresponsive after morning activities. EMS stated at the scene pt responded to sternal rub and answerd questions intermittently; has hx of stroke, HTN, CHF, DM. Pt alert at this time and denies pain. Pt able to state name and dob; no acute distress noted.

## 2017-03-17 ENCOUNTER — Other Ambulatory Visit: Payer: Self-pay

## 2017-03-17 NOTE — Patient Outreach (Signed)
[facility]) Care Management  03/17/2017  Tamara Campbell [birth year] [id number]   Screening Referral date: 03/17/17  Telephone call to patient.  Unable to reach. HIPAA compliant voice message left with call back phone number.   PLAN; RNCM will attempt 2nd telephone call to patient within 1 week.  George Ina RN,BSN,CCM [facility] Telephonic  [phone]

## 2017-03-20 ENCOUNTER — Other Ambulatory Visit: Payer: Self-pay

## 2017-03-20 NOTE — Patient Outreach (Signed)
[facility]) Care Management  03/20/2017  AUTUMNE KALLIO January 04, 1931 [id number]   Screening Referral date: 03/17/17 Attempt #2  Second Telephone call to patient.  Unable to reach. HIPAA compliant voice message left with call back phone number.   PLAN; RNCM will attempt 3rd  telephone call to patient within 1 week.  George Ina RN,BSN,CCM [facility] Telephonic  [phone]

## 2017-03-21 ENCOUNTER — Encounter: Payer: Medicare HMO | Admitting: Nurse Practitioner

## 2017-03-21 NOTE — Telephone Encounter (Signed)
This encounter was created in error - please disregard.

## 2017-03-24 ENCOUNTER — Other Ambulatory Visit: Payer: Self-pay

## 2017-03-24 NOTE — Patient Outreach (Signed)
[facility] Care Management  03/24/2017  Tamara Campbell 05/20/1931 [id number]  Screening Referral date: 03/17/17 Attempt #2  Telephone call to patient.  Unable to reach. HIPAA compliant voice message left with call back phone number.   PLAN; RNCM will attempt 3rd telephone call to patient within 1 week.  Tamara InaDavina Treyten Monestime RN,BSN,CCM [facility] Telephonic  [phone]

## 2017-03-27 ENCOUNTER — Other Ambulatory Visit: Payer: Self-pay

## 2017-03-27 NOTE — Patient Outreach (Signed)
[facility]) Care Management  03/27/2017  Pamelia Hoitnnie C Chichester 07/02/1930 [id number]  Telephone call received from contact identifying themselves as patient's daughter. Health care power not on file. RNCM informed contact patient would have to give verbal authorization to speak with her.  Contact states patient is in a facility and she would have to call RNCM back once she is with patient.    PLAN; RNCM will await call back from patient.  RNCM will attempt 3rd telephone outreach to patient within 1 week.   George InaDavina Mouna Yager RN,BSN,CCM [facility] Telephonic  [phone]

## 2017-03-28 ENCOUNTER — Ambulatory Visit: Payer: Self-pay

## 2017-03-29 ENCOUNTER — Encounter: Payer: Self-pay | Admitting: Gerontology

## 2017-03-29 ENCOUNTER — Non-Acute Institutional Stay (SKILLED_NURSING_FACILITY): Payer: Medicare HMO | Admitting: Gerontology

## 2017-03-29 DIAGNOSIS — E559 Vitamin D deficiency, unspecified: Secondary | ICD-10-CM | POA: Diagnosis not present

## 2017-03-29 DIAGNOSIS — I480 Paroxysmal atrial fibrillation: Secondary | ICD-10-CM | POA: Diagnosis not present

## 2017-03-29 DIAGNOSIS — H40153 Residual stage of open-angle glaucoma, bilateral: Secondary | ICD-10-CM

## 2017-03-29 DIAGNOSIS — E785 Hyperlipidemia, unspecified: Secondary | ICD-10-CM

## 2017-03-29 DIAGNOSIS — Z950 Presence of cardiac pacemaker: Secondary | ICD-10-CM

## 2017-03-29 DIAGNOSIS — I5022 Chronic systolic (congestive) heart failure: Secondary | ICD-10-CM

## 2017-03-29 DIAGNOSIS — E1142 Type 2 diabetes mellitus with diabetic polyneuropathy: Secondary | ICD-10-CM

## 2017-03-29 DIAGNOSIS — Z8673 Personal history of transient ischemic attack (TIA), and cerebral infarction without residual deficits: Secondary | ICD-10-CM

## 2017-03-29 DIAGNOSIS — I081 Rheumatic disorders of both mitral and tricuspid valves: Secondary | ICD-10-CM | POA: Diagnosis not present

## 2017-03-29 DIAGNOSIS — E876 Hypokalemia: Secondary | ICD-10-CM

## 2017-03-29 DIAGNOSIS — R531 Weakness: Secondary | ICD-10-CM

## 2017-03-29 DIAGNOSIS — I110 Hypertensive heart disease with heart failure: Secondary | ICD-10-CM

## 2017-03-29 DIAGNOSIS — Z9181 History of falling: Secondary | ICD-10-CM | POA: Diagnosis not present

## 2017-03-30 ENCOUNTER — Other Ambulatory Visit: Payer: Self-pay

## 2017-03-30 NOTE — Patient Outreach (Signed)
[facility]) Care Management  03/30/2017  Tamara Campbell 09/07/1930 [id number]   Screening Referral date: 03/17/17   Telephone call to patient. Unable to reach. HIPAA compliant voice message left with call back phone number.   PLAN; RNCM will send patient outreach letter to attempt contact.  Tamara InaDavina Shivali Quackenbush RN,BSN,CCM [facility] Telephonic  [phone]

## 2017-04-05 NOTE — Progress Notes (Signed)
Location:   [facility] Room Number: 319B Place of Service:  SNF [phone]) Provider:  Lorenso Quarry, NP-C  Rafael Bihari, MD  Patient Care Team: Rafael Bihari, MD as PCP - General (Internal Medicine) Otho Ket, RN as [facility]  Extended Emergency Contact Information Primary Emergency Contact: Tommie Raymond Address: [address]          [place], [place] [address] [place] Home Phone: [phone] Relation: Daughter Secondary Emergency Contact: Adria [place] of [place] Home Phone: [phone] Relation: Son  Code Status:  DNR Goals of care: Advanced Directive information Advanced Directives 03/29/2017  Does Patient Have a Medical Advance Directive? Yes  Type of Advance Directive Out of facility DNR (pink MOST or yellow form)  Does patient want to make changes to medical advance directive? No - Patient declined  Copy of Healthcare Power of Attorney in Chart? -  Would patient like information on creating a medical advance directive? -  Pre-existing out of facility DNR order (yellow form or pink MOST form) -     Chief Complaint  Patient presents with  . Medical Management of Chronic Issues    Routine Visit    HPI:  Pt is a 83 y.o. female seen today for medical management of chronic issues.  Patient was admitted to the facility for rehab for generalized weakness after admission to [facility] for UTI, weakness.  She has since transitioned to long-term care.  She is incontinent of bowel and bladder.  Requires 1+ assistance for ADLs.  She does get confused at times and tries to hit staff.  However she has not done this lately.  Poor appetite at times.  Weight is stable.  Typically eats between 50-100% of meals.  Denies pain.  Intermittent constipation.  Sleeps soundly a lot at the time.  No falls.  Vital signs stable.  No other complaints  Past Medical History:  Diagnosis Date  .  Aortic valve sclerosis   . Bradycardia   . Cardiac pacemaker in situ    St. Jude single chamber pacemaker model 1210 SN# [id number] with St Jude 1888tc RV SN# [id number] both implanted on 04/26/2011  . CHF (congestive heart failure) (HCC)   . CVA (cerebral vascular accident) (HCC)    Right sided weakness  . Diabetes mellitus without complication (HCC)   . Dyspnea, unspecified   . Heart block    intermittent 3rd degree heart block with ventricular escape rate of approximatley 38bpm  . HFrEF (heart failure with reduced ejection fraction) (HCC)   . History of chicken pox   . History of echocardiogram 03/07/2011   Normal LV size/function. Moderate aortic root dilation, Moderate MR. Severe TR. PASP .  Marland Kitchen Hyperlipidemia, unspecified   . Hypertension   . Osteoarthritis   . Pacemaker   . Paroxysmal atrial fibrillation (HCC)    Documented in August 2016 at the time of CVA  . Stroke [facility]) 2005   Past Surgical History:  Procedure Laterality Date  . ABDOMINAL HYSTERECTOMY    . INSERT REPLACE REMOVE PACEMAKER    . JOINT REPLACEMENT Bilateral    hips  . LUMBAR FUSION  1996  . PACEMAKER PLACEMENT Left    DUAL CHAMBER PACEMAKER GENERATOR  . TOTAL KNEE ARTHROPLASTY Right 08/10/2012   Procedure: RIGHT TOTAL KNEE ARTHROPLASTY; Surgeon: Christell Faith, MD; Location: [facility] OR; Service: Orthopedics; Laterality: Right;    No Known Allergies  Allergies as of 03/29/2017  No Known Allergies     Medication List       Accurate as of 03/29/17 11:59 PM. Always use your most recent med list.          acetaminophen 500 MG tablet Commonly known as:  TYLENOL Take 500 mg by mouth every 4 (four) hours as needed for mild pain or fever.   amLODipine-benazepril 10-20 MG capsule Commonly known as:  LOTREL Take 1 capsule by mouth daily.   apixaban 2.5 MG Tabs tablet Commonly known as:  ELIQUIS Take 1 tablet (2.5 mg total) by mouth 2 (two) times daily.   atorvastatin 20 MG  tablet Commonly known as:  LIPITOR Take 1 tablet (20 mg total) by mouth daily at 6 PM.   bimatoprost 0.01 % Soln Commonly known as:  LUMIGAN Place 1 drop into both eyes at bedtime.   carvedilol 6.25 MG tablet Commonly known as:  COREG Take 1 tablet (6.25 mg total) by mouth 2 (two) times daily with a meal.   cholecalciferol 1000 units tablet Commonly known as:  VITAMIN D Take 2,000 Units by mouth daily.   hydrochlorothiazide 25 MG tablet Commonly known as:  HYDRODIURIL Take 25 mg by mouth daily.   multivitamin with minerals Tabs tablet Take 1 tablet by mouth daily.   neomycin-bacitracin-polymyxin [phone] ointment Apply 1 application topically to left leg wound every day and cover with foam dressing.   potassium chloride 10 MEQ tablet Commonly known as:  K-DUR,KLOR-CON Take 10 mEq by mouth daily.       Review of Systems  Constitutional: Negative for activity change, appetite change, chills, diaphoresis, fatigue and fever.  HENT: Negative for congestion, sneezing, sore throat, trouble swallowing and voice change.   Eyes: Negative for pain, redness and visual disturbance.  Respiratory: Negative for apnea, cough, choking, chest tightness, shortness of breath and wheezing.   Cardiovascular: Negative for chest pain, palpitations and leg swelling.  Gastrointestinal: Negative for abdominal distention, abdominal pain, constipation, diarrhea and nausea.  Genitourinary: Negative for difficulty urinating, dysuria, frequency and urgency.  Musculoskeletal: Negative for back pain, gait problem and myalgias. Arthralgias: typical arthritis.  Skin: Negative for color change, pallor, rash and wound.  Neurological: Positive for weakness. Negative for dizziness, tremors, seizures, syncope, facial asymmetry, speech difficulty, light-headedness, numbness and headaches.  Psychiatric/Behavioral: Negative for agitation and behavioral problems.  All other systems reviewed and are  negative.   Immunization History  Administered Date(s) Administered  . Influenza Split 04/08/2014  . Influenza, High Dose Seasonal PF 02/23/2016  . Influenza-Unspecified 02/28/2017  . Pneumococcal Conjugate-13 02/23/2016  . Pneumococcal Polysaccharide-23 01/10/2015   Pertinent  Health Maintenance Due  Topic Date Due  . FOOT EXAM  01/11/1941  . OPHTHALMOLOGY EXAM  01/11/1941  . DEXA SCAN  01/10/1998  . HEMOGLOBIN A1C  11/02/202018  . INFLUENZA VACCINE  Completed  . PNA vac Low Risk Adult  Completed   Fall Risk  09/14/2015 09/14/2015 03/16/2015 03/16/2015  Falls in the past year? No No Yes No  Number falls in past yr: - - 1 -  Injury with Fall? - - Yes -  Risk for fall due to : Impaired balance/gait - - -  Follow up - - Education provided;Falls prevention discussed -   Functional Status Survey:    Vitals:   03/29/17 1027  BP: 140/87  Pulse: 73  Resp: 16  Temp: 97.6 F (36.4 C)  SpO2: 97%  Weight: 112 lb (50.8 kg)  Height: 5\' 2"  (1.575 m)   Body mass  index is 20.49 kg/m. Physical Exam  Constitutional: She is oriented to person, place, and time. Vital signs are normal. She appears well-developed and well-nourished. She appears listless. She is active and cooperative. No distress.  HENT:  Head: Normocephalic and atraumatic.  Mouth/Throat: Uvula is midline, oropharynx is clear and moist and mucous membranes are normal. Mucous membranes are not pale, not dry and not cyanotic.  Eyes: Conjunctivae, EOM and lids are normal. Right pupil is not reactive. Left pupil is not reactive.  Neck: Trachea normal, normal range of motion and full passive range of motion without pain. Neck supple. No JVD present. No tracheal deviation, no edema and no erythema present. No thyromegaly present.  Cardiovascular: Normal rate, regular rhythm and intact distal pulses.  Exam reveals no gallop, no distant heart sounds and no friction rub.   No murmur heard. Pulses:      Dorsalis pedis pulses are 1+  on the right side, and 1+ on the left side.  No edema  Pulmonary/Chest: Effort normal and breath sounds normal. No accessory muscle usage. No respiratory distress. She has no decreased breath sounds. She has no wheezes. She has no rhonchi. She has no rales. She exhibits no tenderness.  Abdominal: Soft. Normal appearance. She exhibits no distension and no ascites. Bowel sounds are decreased. There is no tenderness.  Musculoskeletal: Normal range of motion. She exhibits no edema or tenderness.  Expected osteoarthritis, stiffness  Neurological: She is oriented to person, place, and time. She has normal strength. She appears listless. A sensory deficit is present. She exhibits abnormal muscle tone. Coordination abnormal.  Skin: Skin is warm, dry and intact. No rash noted. She is not diaphoretic. No cyanosis or erythema. No pallor. Nails show no clubbing.  Psychiatric: She has a normal mood and affect. Her speech is normal and behavior is normal. Judgment and thought content normal. Cognition and memory are impaired.  Nursing note and vitals reviewed.   Labs reviewed:  Recent Labs  12/28/16 0454 12/29/16 0322 03/10/17 1329  NA 139 138 140  K 3.2* 3.6 3.6  CL 105 105 106  CO2 25 25 25   GLUCOSE 91 98 94  BUN 18 14 33*  CREATININE 0.72 0.93 0.92  CALCIUM 9.0 8.9 9.6    Recent Labs  04/25/16 2020 12/27/16 2105 03/10/17 1329  AST 29 22 30   ALT 24 15 27   ALKPHOS 80 90 76  BILITOT 0.5 0.8 0.7  PROT 7.4 7.5 8.0  ALBUMIN 4.0 4.0 3.8    Recent Labs  12/27/16 2105 12/28/16 0454 03/10/17 1329  WBC 3.4* 3.6 3.0*  HGB 13.3 12.6 13.1  HCT 40.6 38.2 39.8  MCV 82.9 84.1 84.7  PLT 192 175 203   No results found for: TSH Lab Results  Component Value Date   HGBA1C 6.0 (H) 04/26/2016   Lab Results  Component Value Date   CHOL 150 04/26/2016   HDL 59 04/26/2016   LDLCALC 81 04/26/2016   TRIG 51 04/26/2016   CHOLHDL 2.5 04/26/2016    Significant Diagnostic Results in last 30  days:  Dg Chest Portable 1 View  Result Date: 03/10/2017 CLINICAL DATA:  Sudden onset of unresponsiveness.  Cough. EXAM: PORTABLE CHEST 1 VIEW COMPARISON:  Two-view chest x-ray 12/27/2016 FINDINGS: The heart is enlarged. There is no edema or effusion. Atherosclerotic calcifications are present at the aortic arch. The visualized soft tissues and bony thorax are unremarkable. IMPRESSION: 1. Stable cardiomegaly without failure. 2. Aortic atherosclerosis. Electronically Signed   By: Cristal Deer  Mattern M.D.   On: 03/10/2017 14:29    Assessment/Plan 1. Diabetic polyneuropathy associated with type 2 diabetes mellitus (HCC)  Stable  2. Residual stage of open-angle glaucoma, bilateral  Stable   Continue Lumigan (bimatoprost) drops; 0.01 %- 1 drop in each eye Q HS  3. Rheumatic disorders of both mitral and tricuspid valves  Stable   4. Paroxysmal atrial fibrillation (HCC)  Stable   Continue Eliquis 2.5 mg po BID  5. Hypertensive heart disease with heart failure (HCC)  Stable   Continue amlodipine-benazepril capsule; 10-20 mg- 1 capsule po Q Day  Continue Carvedilol 6.25 mg po BID  6. Chronic systolic (congestive) heart failure (HCC)  Stable   Contiinue HCTZ 25 mg po Q Day  7. Hyperlipidemia, unspecified hyperlipidemia type  Stable   Continue Atorvastatin 20 mg po Q Day  8. Personal history of transient ischemic attack (TIA), and cerebral infarction without residual deficits  Stable   9. Presence of cardiac pacemaker  Stable   10. Generalized weakness 11. Risk for falls  Stable   Continue PT/OT  Continue exercises as taught by PT/OT  Encourage PO intake  Safety precautions   12. Vitamin D deficiency  Stable   Continue Cholecalciferol 2,000 units po Q Day  Continue MVI 1 tablet po Q Day  13. Hypokalemia  Stable   Continue Potassium chloride 10 meq po Q Day  Continue all medications as listed above unless otherwise indicated  Family/ staff  Communication:   Total Time:  Documentation:  Face to Face:  Family/Phone:   Labs/tests ordered: Not due  Medication list reviewed and assessed for continued appropriateness.  Brynda Rim, NP-C Geriatrics [facility] [phone], [place] [address] Cell Phone (Mon-Fri 8am-5pm):  [phone] On Call:  [phone] & follow prompts after 5pm & weekends Office Phone:  [phone] Office Fax:  [phone]  1. Diabetic polyneuropathy associated with type 2 diabetes mellitus (HCC)  Stable  2. Residual stage of open-angle glaucoma, bilateral  Stable   Continue Lumigan (bimatoprost) drops; 0.01 %- 1 drop in each eye Q HS  3. Rheumatic disorders of both mitral and tricuspid valves  Stable   4. Paroxysmal atrial fibrillation (HCC)  Stable   Continue Eliquis 2.5 mg po BID  5. Hypertensive heart disease with heart failure (HCC)  Stable   Continue amlodipine-benazepril capsule; 10-20 mg- 1 capsule po Q Day  Continue Carvedilol 6.25 mg po BID  6. Chronic systolic (congestive) heart failure (HCC)  Stable   Conintue HCTZ 25 mg po Q Day  7. Hyperlipidemia, unspecified hyperlipidemia type  Stable   Continue Atorvastatin 20 mg po Q Day  8. Personal history of transient ischemic attack (TIA), and cerebral infarction without residual deficits  Stable   9. Presence of cardiac pacemaker  Stable   10. Generalized weakness 11. Risk for falls  Stable   Continue PT/OT  Continue exercises as taught by PT/OT  Encourage PO intake  Safety precautions   12. Vitamin D deficiency  Stable   Continue Cholecalciferol 2,000 units po Q Day  Continue MVI 1 tablet po Q Day  13. Hypokalemia  Stable   Continue Potassium chloride 10 meq po Q Day

## 2017-04-06 ENCOUNTER — Encounter
Admission: RE | Admit: 2017-04-06 | Discharge: 2017-04-06 | Disposition: A | Discharge status: Home / Self Care | Payer: Medicare HMO | Source: Ambulatory Visit | Attending: Internal Medicine | Admitting: Internal Medicine

## 2017-04-14 ENCOUNTER — Other Ambulatory Visit: Payer: Self-pay

## 2017-04-14 NOTE — Patient Outreach (Signed)
[facility] Care Management  04/14/2017  Pamelia Hoitnnie C Ciavarella 11/29/1930 [id number]  Case closure Screening Referral date: 03/17/17  No response from patient after 3 telephone calls and outreach letter.  PLAN; RNCM will refer patient to care management assistant to close due to being unable to reach patient.  RNCM will notify patients primary MD of closure.  George InaDavina Carsyn Boster RN,BSN,CCM [facility] Telephonic  [phone]

## 2017-05-04 ENCOUNTER — Non-Acute Institutional Stay (SKILLED_NURSING_FACILITY): Payer: Medicare HMO | Admitting: Gerontology

## 2017-05-04 ENCOUNTER — Encounter: Payer: Self-pay | Admitting: Gerontology

## 2017-05-04 DIAGNOSIS — I110 Hypertensive heart disease with heart failure: Secondary | ICD-10-CM

## 2017-05-04 DIAGNOSIS — I5022 Chronic systolic (congestive) heart failure: Secondary | ICD-10-CM

## 2017-05-04 DIAGNOSIS — I480 Paroxysmal atrial fibrillation: Secondary | ICD-10-CM | POA: Diagnosis not present

## 2017-05-06 ENCOUNTER — Encounter
Admission: RE | Admit: 2017-05-06 | Discharge: 2017-05-06 | Disposition: A | Discharge status: Home / Self Care | Payer: Medicare HMO | Source: Ambulatory Visit | Attending: Internal Medicine | Admitting: Internal Medicine

## 2017-06-01 ENCOUNTER — Encounter: Payer: Self-pay | Admitting: Gerontology

## 2017-06-01 ENCOUNTER — Non-Acute Institutional Stay (SKILLED_NURSING_FACILITY): Payer: Medicare HMO | Admitting: Gerontology

## 2017-06-01 DIAGNOSIS — E559 Vitamin D deficiency, unspecified: Secondary | ICD-10-CM | POA: Diagnosis not present

## 2017-06-01 DIAGNOSIS — Z9181 History of falling: Secondary | ICD-10-CM | POA: Diagnosis not present

## 2017-06-01 DIAGNOSIS — E1142 Type 2 diabetes mellitus with diabetic polyneuropathy: Secondary | ICD-10-CM | POA: Diagnosis not present

## 2017-06-06 ENCOUNTER — Encounter
Admission: RE | Admit: 2017-06-06 | Discharge: 2017-06-06 | Disposition: A | Discharge status: Home / Self Care | Payer: Medicare HMO | Source: Ambulatory Visit | Attending: Internal Medicine | Admitting: Internal Medicine

## 2017-06-22 NOTE — Progress Notes (Signed)
Location:    Nursing Home Room Number: 319B Place of Service:  SNF (31) Provider:  Lorenso QuarryShannon Valary Manahan, NP-C  Tamara BihariWalker, John B III, MD  Patient Care Team: Tamara BihariWalker, John B III, MD as PCP - General (Internal Medicine)  Extended Emergency Contact Information Primary Emergency Contact: Tamara [place] Address: [address]          [place], [place] [address] Tamara [place] Home Phone: [phone] Relation: Daughter Secondary Emergency Contact: Tamara [place]  [place] Home Phone: [phone] Relation: Son  Code Status: DNR Goals of care: Advanced Directive information Advanced Directives 06/01/2017  Does Patient Have a Medical Advance Directive? Yes  Type of Estate agentAdvance Directive Healthcare Power of [place];Out of facility DNR (pink MOST or yellow form)  Does patient want to make changes to medical advance directive? No - Patient declined  Copy of Healthcare Power of Attorney in Chart? Yes  Would patient like information on creating a medical advance directive? -  Pre-existing out of facility DNR order (yellow form or pink MOST form) -     Chief Complaint  Patient presents with  . Medical Management of Chronic Issues    Routine Visit    HPI:  Pt is a 84 y.o. female seen today for medical management of chronic diseases.    Chronic systolic (congestive) heart failure (HCC) Stable.  No edema currently present.  Patient denies chest pain or shortness of breath.  Patient is stable on hydrochlorothiazide 25 mg p.o. daily along with concurrent dose of potassium chloride 10 mEq p.o. daily.  No recent exacerbations.  Hypertensive heart disease with heart failure (HCC) Stable.  No recent episodes of hypotension.  Patient remains on Coreg 6.25 mg p.o. twice daily and Lotrel 10/20 mg 1 capsule p.o. daily.  Paroxysmal atrial fibrillation (HCC) Stable.  No recent exacerbations.  Patient remains on Coreg 6.25 mg p.o. twice daily as well as Eliquis 2.5 mg p.o. twice  daily for anticoagulation.  Please note decreased verbal ability.  Unable to obtain complete ROS.  Past Medical History:  Diagnosis Date  . Aortic valve sclerosis   . Bradycardia   . Cardiac pacemaker in situ    St. Jude single chamber pacemaker model 1210 SN# [id number] with St Jude 1888tc RV SN# [id number] both implanted on 04/26/2011  . CHF (congestive heart failure) (HCC)   . CVA (cerebral vascular accident) (HCC)    Right sided weakness  . Diabetes mellitus without complication (HCC)   . Dyspnea, unspecified   . Heart block    intermittent 3rd degree heart block with ventricular escape rate of approximatley 38bpm  . HFrEF (heart failure with reduced ejection fraction) (HCC)   . History of chicken pox   . History of echocardiogram 03/07/2011   Normal LV size/function. Moderate aortic root dilation, Moderate MR. Severe TR. PASP 65mmHg.  Marland Kitchen. Hyperlipidemia, unspecified   . Hypertension   . Osteoarthritis   . Pacemaker   . Paroxysmal atrial fibrillation (HCC)    Documented in August 2016 at the time of CVA  . Stroke [facility](HCC) 2005   Past Surgical History:  Procedure Laterality Date  . ABDOMINAL HYSTERECTOMY    . INSERT REPLACE REMOVE PACEMAKER    . JOINT REPLACEMENT Bilateral    hips  . LUMBAR FUSION  1996  . PACEMAKER PLACEMENT Left    DUAL CHAMBER PACEMAKER GENERATOR  . TOTAL KNEE ARTHROPLASTY Right 08/10/2012   Procedure: RIGHT TOTAL KNEE ARTHROPLASTY; Surgeon: Christell FaithPaul Francis Lachiewicz, MD; [facility]; Service: Orthopedics; Laterality:  Right;    No Known Allergies  Allergies as of 05/04/2017   No Known Allergies     Medication List        Accurate as of 05/04/17 11:59 PM. Always use your most recent med list.          acetaminophen 500 MG tablet Commonly known as:  TYLENOL Take 500 mg by mouth every 4 (four) hours as needed for mild pain or fever.   amLODipine-benazepril 10-20 MG capsule Commonly known as:  LOTREL Take 1 capsule by mouth daily.     apixaban 2.5 MG Tabs tablet Commonly known as:  ELIQUIS Take 1 tablet (2.5 mg total) by mouth 2 (two) times daily.   atorvastatin 20 MG tablet Commonly known as:  LIPITOR Take 1 tablet (20 mg total) by mouth daily at 6 PM.   bimatoprost 0.01 % Soln Commonly known as:  LUMIGAN Place 1 drop into both eyes at bedtime.   carvedilol 6.25 MG tablet Commonly known as:  COREG Take 1 tablet (6.25 mg total) by mouth 2 (two) times daily with a meal.   cholecalciferol 1000 units tablet Commonly known as:  VITAMIN D Take 2,000 Units by mouth daily.   hydrochlorothiazide 25 MG tablet Commonly known as:  HYDRODIURIL Take 25 mg by mouth daily.   magnesium hydroxide 400 MG/5ML suspension Commonly known as:  MILK OF MAGNESIA Take 30 mLs by mouth every 4 (four) hours as needed for mild constipation. For constipation/ no BM for 2 days   multivitamin with minerals Tabs tablet Take 1 tablet by mouth daily.   neomycin-bacitracin-polymyxin [id number] ointment Apply 1 application topically to left leg wound every day and cover with foam dressing.   potassium chloride 10 MEQ tablet Commonly known as:  K-DUR,KLOR-CON Take 10 mEq by mouth daily.       Review of Systems  Unable to perform ROS: Age  Constitutional: Negative for activity change, appetite change, chills, diaphoresis and fever.  HENT: Negative for congestion, mouth sores, nosebleeds, postnasal drip, sneezing, sore throat, trouble swallowing and voice change.   Respiratory: Negative for apnea, cough, choking, chest tightness, shortness of breath and wheezing.   Cardiovascular: Negative for chest pain, palpitations and leg swelling.  Gastrointestinal: Negative for abdominal distention, abdominal pain, constipation, diarrhea and nausea.  Genitourinary: Negative for difficulty urinating, dysuria, frequency and urgency.  Musculoskeletal: Negative for back pain, gait problem and myalgias. Arthralgias: typical arthritis.  Skin: Negative  for color change, pallor, rash and wound.  Neurological: Negative for dizziness, tremors, syncope, speech difficulty, weakness, numbness and headaches.  Psychiatric/Behavioral: Negative for agitation and behavioral problems.  All other systems reviewed and are negative.   Immunization History  Administered Date(s) Administered  . Influenza Split 04/08/2014  . Influenza, High Dose Seasonal PF 02/23/2016  . Influenza-Unspecified 02/28/2017  . Pneumococcal Conjugate-13 02/23/2016  . Pneumococcal Polysaccharide-23 01/10/2015   Pertinent  Health Maintenance Due  Topic Date Due  . FOOT EXAM  01/11/1941  . OPHTHALMOLOGY EXAM  01/11/1941  . DEXA SCAN  01/10/1998  . HEMOGLOBIN A1C  06-22-202018  . INFLUENZA VACCINE  Completed  . PNA vac Low Risk Adult  Completed   Fall Risk  09/14/2015 09/14/2015 03/16/2015 03/16/2015  Falls in the past year? No No Yes No  Number falls in past yr: - - 1 -  Injury with Fall? - - Yes -  Risk for fall due to : Impaired balance/gait - - -  Follow up - - Education provided;Falls prevention discussed -  Functional Status Survey:    Vitals:   05/04/17 1056  BP: 127/89  Pulse: 75  Resp: 16  Temp: 98.3 F (36.8 C)  TempSrc: Oral  SpO2: 97%  Weight: 111 lb (50.3 kg)  Height: 5\' 2"  (1.575 m)   Body mass index is 20.3 kg/m. Physical Exam  Constitutional: She is oriented to person, place, and time. Vital signs are normal. She appears well-developed and well-nourished. She is active and cooperative. She does not appear ill. No distress.  HENT:  Head: Normocephalic and atraumatic.  Mouth/Throat: Uvula is midline, oropharynx is clear and moist and mucous membranes are normal. Mucous membranes are not pale, not dry and not cyanotic.  Eyes: Conjunctivae, EOM and lids are normal. Pupils are equal, round, and reactive to light.  Neck: Trachea normal, normal range of motion and full passive range of motion without pain. Neck supple. No JVD present. No tracheal  deviation, no edema and no erythema present. No thyromegaly present.  Cardiovascular: Normal rate, regular rhythm, normal heart sounds, intact distal pulses and normal pulses. Exam reveals no gallop, no distant heart sounds and no friction rub.  No murmur heard. Pulses:      Dorsalis pedis pulses are 2+ on the right side, and 2+ on the left side.  No edema  Pulmonary/Chest: Effort normal and breath sounds normal. No accessory muscle usage. No respiratory distress. She has no decreased breath sounds. She has no wheezes. She has no rhonchi. She has no rales. She exhibits no tenderness.  Abdominal: Soft. Normal appearance and bowel sounds are normal. She exhibits no distension and no ascites. There is no tenderness.  Musculoskeletal: Normal range of motion. She exhibits no edema or tenderness.  Expected osteoarthritis, stiffness; Bilateral Calves soft, supple. Negative Homan's Sign. B- pedal pulses equal; generalized weakness  Neurological: She is alert and oriented to person, place, and time. She has normal strength.  Skin: Skin is warm, dry and intact. She is not diaphoretic. No cyanosis. No pallor. Nails show no clubbing.  Psychiatric: Her speech is normal. Judgment and thought content normal. Her affect is blunt. She is slowed. Cognition and memory are impaired. She exhibits abnormal recent memory and abnormal remote memory.  Nursing note and vitals reviewed.   Labs reviewed: Recent Labs    12/28/16 0454 12/29/16 0322 03/10/17 1329  NA 139 138 140  K 3.2* 3.6 3.6  CL 105 105 106  CO2 25 25 25   GLUCOSE 91 98 94  BUN 18 14 33*  CREATININE 0.72 0.93 0.92  CALCIUM 9.0 8.9 9.6   Recent Labs    12/27/16 2105 03/10/17 1329  AST 22 30  ALT 15 27  ALKPHOS 90 76  BILITOT 0.8 0.7  PROT 7.5 8.0  ALBUMIN 4.0 3.8   Recent Labs    12/27/16 2105 12/28/16 0454 03/10/17 1329  WBC 3.4* 3.6 3.0*  HGB 13.3 12.6 13.1  HCT 40.6 38.2 39.8  MCV 82.9 84.1 84.7  PLT 192 175 203   No  results found for: TSH Lab Results  Component Value Date   HGBA1C 6.0 (H) 04/26/2016   Lab Results  Component Value Date   CHOL 150 04/26/2016   HDL 59 04/26/2016   LDLCALC 81 04/26/2016   TRIG 51 04/26/2016   CHOLHDL 2.5 04/26/2016    Significant Diagnostic Results in last 30 days:  No results found.  Assessment/Plan [place] was seen today for medical management of chronic issues.  Diagnoses and all orders for this visit:  Paroxysmal  atrial fibrillation (HCC)  Stable  Continue current medication regimen  Monitor for signs or symptoms of bleeding related to Eliquis  Follow-up with cardiologist as needed  Hypertensive heart disease with heart failure (HCC)  Stable  Continue current medication regimen  Monitor for potential hypotension  Chronic systolic (congestive) heart failure (HCC)  Stable  Continue current medication regimen  Follow-up with cardiologist as needed  Monthly weights and as needed   Family/ staff Communication:  Total Time:  Documentation:  Face to Face:  Family/Phone:   Labs/tests ordered: Not due  Medication list reviewed and assessed for continued appropriateness. Monthly medication orders reviewed and signed.  Brynda Rim, NP-C Geriatrics [facility] [phone] [address], [place] [address] Cell Phone (Mon-Fri 8am-5pm):  [phone] On Call:  [phone] & follow prompts after 5pm & weekends Office Phone:  [phone] Office Fax:  [phone]

## 2017-06-22 NOTE — Assessment & Plan Note (Signed)
Stable.  No recent falls.  Patient is mobile on unit with wheelchair with some self propulsion.

## 2017-06-22 NOTE — Progress Notes (Signed)
Location:    Nursing Home Room Number: 371I Place of Service:  SNF (31) Provider:  Toni Arthurs, NP-C  Madelyn Brunner, MD  Patient Care Team: Madelyn Brunner, MD as PCP - General (Internal Medicine)  Extended Emergency Contact Information Primary Emergency Contact: Cyndee Brightly Address: [address]          [place], [place] [address] Johnnette [place]itter of [place] Phone: [phone] Relation: Daughter Secondary Emergency Contact: Delfin Gant [place] Phone: [phone] Relation: Son  Code Status: DNR Goals of care: Advanced Directive information Advanced Directives 06/01/2017  Does Patient Have a Medical Advance Directive? Yes  Type of Paramedic of [place];Out of facility DNR (pink MOST or yellow form)  Does patient want to make changes to medical advance directive? No - Patient declined  Copy of Stamps in Chart? Yes  Would patient like information on creating a medical advance directive? -  Pre-existing out of facility DNR order (yellow form or pink MOST form) -     Chief Complaint  Patient presents with  . Medical Management of Chronic Issues    Routine Visit    HPI:  Pt is a 84 y.o. female seen today for medical management of chronic diseases.    Diabetes mellitus with polyneuropathy (HCC) Stable.  FS BS daily in the early a.m. and as needed.  NCS diet.  Blood sugar stable, majority in the normal range.  No recent episodes of hypoglycemia.  No reported worsening of polyneuropathy.  Patient denies numbness/tingling of the extremities.  Vitamin D deficiency Stable.  Continue current regimen of cholecalciferol 1000 units - 2 tablets p.o. Daily.  Assess level next month.  Risk for falls Stable.  No recent falls.  Patient is mobile on unit with wheelchair with some self propulsion.  Please note patient has limited verbal ability limiting ROS  Past Medical History:  Diagnosis  Date  . Aortic valve sclerosis   . Bradycardia   . Cardiac pacemaker in situ    St. Jude single chamber pacemaker model 1210 SN# [id number] with St Jude 1888tc RV SN# [id number] both implanted on 04/26/2011  . CHF (congestive heart failure) ([place])   . CVA (cerebral vascular accident) ([place])    Right sided weakness  . Diabetes mellitus without complication ([place])   . Dyspnea, unspecified   . Heart block    intermittent 3rd degree heart block with ventricular escape rate of approximatley 38bpm  . HFrEF (heart failure with reduced ejection fraction) ([place])   . History of chicken pox   . History of echocardiogram 03/07/2011   Normal LV size/function. Moderate aortic root dilation, Moderate MR. Severe TR. PASP 86mHg.  .Marland KitchenHyperlipidemia, unspecified   . Hypertension   . Osteoarthritis   . Pacemaker   . [place]    Documented in August 2016 at the time of CVA  . Stroke ([facility] 2005   Past Surgical History:  Procedure Laterality Date  . ABDOMINAL HYSTERECTOMY    . INSERT REPLACE REMOVE PACEMAKER    . JOINT REPLACEMENT Bilateral    hips  . LUMBAR FUSION  1996  . PACEMAKER PLACEMENT Left    DUAL CHAMBER PACEMAKER GENERATOR  . TOTAL KNEE ARTHROPLASTY Right 08/10/2012   Procedure: RIGHT TOTAL KNEE ARTHROPLASTY; Surgeon: PZorita Pang MD; [facility] Service: Orthopedics; Laterality: Right;    No Known Allergies  Allergies as of 06/01/2017   No Known Allergies  Medication List        Accurate as of 06/01/17 11:59 PM. Always use your most recent med list.          acetaminophen 500 MG tablet Commonly known as:  TYLENOL Take 500 mg by mouth every 4 (four) hours as needed for mild pain or fever.   amLODipine-benazepril 10-20 MG capsule Commonly known as:  LOTREL Take 1 capsule by mouth daily.   apixaban 2.5 MG Tabs tablet Commonly known as:  ELIQUIS Take 1 tablet (2.5 mg total) by mouth 2 (two) times daily.   atorvastatin 20 MG  tablet Commonly known as:  LIPITOR Take 1 tablet (20 mg total) by mouth daily at 6 PM.   bimatoprost 0.01 % Soln Commonly known as:  LUMIGAN Place 1 drop into both eyes at bedtime.   carvedilol 6.25 MG tablet Commonly known as:  COREG Take 1 tablet (6.25 mg total) by mouth 2 (two) times daily with a meal.   cholecalciferol 1000 units tablet Commonly known as:  VITAMIN D Take 2,000 Units by mouth daily.   hydrochlorothiazide 25 MG tablet Commonly known as:  HYDRODIURIL Take 25 mg by mouth daily.   magnesium hydroxide 400 MG/5ML suspension Commonly known as:  MILK OF MAGNESIA Take 30 mLs by mouth every 4 (four) hours as needed for mild constipation. For constipation/ no BM for 2 days   multivitamin with minerals Tabs tablet Take 1 tablet by mouth daily.   neomycin-bacitracin-polymyxin [phone] ointment Apply 1 application topically to left leg wound every day and cover with foam dressing.   potassium chloride 10 MEQ tablet Commonly known as:  K-DUR,KLOR-CON Take 10 mEq by mouth daily.       Review of Systems  Constitutional: Negative for activity change, appetite change, chills, diaphoresis and fever.  HENT: Negative for congestion, mouth sores, nosebleeds, postnasal drip, sneezing, sore throat, trouble swallowing and voice change.   Respiratory: Negative for apnea, cough, choking, chest tightness, shortness of breath and wheezing.   Cardiovascular: Negative for chest pain, palpitations and leg swelling.  Gastrointestinal: Negative for abdominal distention, abdominal pain, constipation, diarrhea and nausea.  Genitourinary: Negative for difficulty urinating, dysuria, frequency and urgency.  Musculoskeletal: Positive for arthralgias (typical arthritis). Negative for back pain, gait problem and myalgias.  Skin: Negative for color change, pallor, rash and wound.  Neurological: Negative for dizziness, tremors, syncope, speech difficulty, weakness, numbness and headaches.    Psychiatric/Behavioral: Negative for agitation and behavioral problems.  All other systems reviewed and are negative.   Immunization History  Administered Date(s) Administered  . Influenza Split 04/08/2014  . Influenza, High Dose Seasonal PF 02/23/2016  . Influenza-Unspecified 02/28/2017  . Pneumococcal Conjugate-13 02/23/2016  . Pneumococcal Polysaccharide-23 01/10/2015   Pertinent  Health Maintenance Due  Topic Date Due  . FOOT EXAM  01/11/1941  . OPHTHALMOLOGY EXAM  01/11/1941  . DEXA SCAN  01/10/1998  . HEMOGLOBIN A1C  2020/05/2217  . INFLUENZA VACCINE  Completed  . PNA vac Low Risk Adult  Completed   Fall Risk  09/14/2015 09/14/2015 03/16/2015 03/16/2015  Falls in the past year? No No Yes No  Number falls in past yr: - - 1 -  Injury with Fall? - - Yes -  Risk for fall due to : Impaired balance/gait - - -  Follow up - - Education provided;Falls prevention discussed -   Functional Status Survey:    Vitals:   06/01/17 1458  BP: 124/88  Pulse: 71  Resp: 20  Temp: 98.1 F (  36.7 C)  TempSrc: Oral  SpO2: 97%  Weight: 118 lb 14.4 oz (53.9 kg)  Height: 5' 2"  (1.575 m)   Body mass index is 21.75 kg/m. Physical Exam  Constitutional: She is oriented to person, place, and time. Vital signs are normal. She appears well-developed and well-nourished. She is active and cooperative. She does not appear ill. No distress.  HENT:  Head: Normocephalic and atraumatic.  Mouth/Throat: Uvula is midline, oropharynx is clear and moist and mucous membranes are normal. Mucous membranes are not pale, not dry and not cyanotic.  Eyes: Conjunctivae, EOM and lids are normal. Pupils are equal, round, and reactive to light.  Neck: Trachea normal, normal range of motion and full passive range of motion without pain. Neck supple. No JVD present. No tracheal deviation, no edema and no erythema present. No thyromegaly present.  Cardiovascular: Normal rate, regular rhythm, normal heart sounds, intact  distal pulses and normal pulses. Exam reveals no gallop, no distant heart sounds and no friction rub.  No murmur heard. Pulses:      Dorsalis pedis pulses are 2+ on the right side, and 2+ on the left side.  No edema  Pulmonary/Chest: Effort normal and breath sounds normal. No accessory muscle usage. No respiratory distress. She has no decreased breath sounds. She has no wheezes. She has no rhonchi. She has no rales. She exhibits no tenderness.  Abdominal: Soft. Normal appearance and bowel sounds are normal. She exhibits no distension and no ascites. There is no tenderness.  Musculoskeletal: Normal range of motion. She exhibits no edema or tenderness.  Expected osteoarthritis, stiffness; Bilateral Calves soft, supple. Negative Homan's Sign. B- pedal pulses equal; generalized weakness  Neurological: She is alert and oriented to person, place, and time. She has normal strength. She exhibits abnormal muscle tone. Coordination and gait abnormal.  Skin: Skin is warm, dry and intact. She is not diaphoretic. No cyanosis. No pallor. Nails show no clubbing.  Psychiatric: Judgment and thought content normal. Her affect is blunt. Her speech is delayed. She is slowed. Cognition and memory are impaired. She exhibits abnormal recent memory and abnormal remote memory.  Nursing note and vitals reviewed.   Labs reviewed: Recent Labs    12/28/16 0454 12/29/16 0322 03/10/17 1329  NA 139 138 140  K 3.2* 3.6 3.6  CL 105 105 106  CO2 25 25 25   GLUCOSE 91 98 94  BUN 18 14 33*  CREATININE 0.72 0.93 0.92  CALCIUM 9.0 8.9 9.6   Recent Labs    12/27/16 2105 03/10/17 1329  AST 22 30  ALT 15 27  ALKPHOS 90 76  BILITOT 0.8 0.7  PROT 7.5 8.0  ALBUMIN 4.0 3.8   Recent Labs    12/27/16 2105 12/28/16 0454 03/10/17 1329  WBC 3.4* 3.6 3.0*  HGB 13.3 12.6 13.1  HCT 40.6 38.2 39.8  MCV 82.9 84.1 84.7  PLT 192 175 203   No results found for: TSH Lab Results  Component Value Date   HGBA1C 6.0 (H)  04/26/2016   Lab Results  Component Value Date   CHOL 150 04/26/2016   HDL 59 04/26/2016   LDLCALC 81 04/26/2016   TRIG 51 04/26/2016   CHOLHDL 2.5 04/26/2016    Significant Diagnostic Results in last 30 days:  No results found.  Assessment/Plan [place] was seen today for medical management of chronic issues.  Diagnoses and all orders for this visit:  Diabetic polyneuropathy associated with type 2 diabetes mellitus ([place])  Stable  Continue daily  and as needed FS BS  Check A1c next month  Monitor skin integrity  Routine labs next month prior to next visit  Vitamin D deficiency  Stable  Continue current medication regimen  Check level next month  Risk for falls  Stable  Continue working with restorative nursing program  Mobilize on unit with wheelchair   Family/ staff Communication:   Total Time:  Documentation:  Face to Face:  Family/Phone:   Labs/tests ordered: cbc, met c, mag, tsh, B12, D, A1c  Medication list reviewed and assessed for continued appropriateness. Monthly medication orders reviewed and signed.  Vikki Ports, NP-C Geriatrics [facility] [phone] [place], [place] [address] Cell Phone (Mon-Fri 8am-5pm):  [phone] On Call:  [phone] & follow prompts after 5pm & weekends Office Phone:  [phone] Office Fax:  [phone]

## 2017-06-22 NOTE — Assessment & Plan Note (Signed)
Stable.  Continue current regimen of cholecalciferol 1000 units - 2 tablets p.o. Daily.  Assess level next month.

## 2017-06-22 NOTE — Assessment & Plan Note (Signed)
Stable.  No recent exacerbations.  Patient remains on Coreg 6.25 mg p.o. twice daily as well as Eliquis 2.5 mg p.o. twice daily for anticoagulation.

## 2017-06-22 NOTE — Assessment & Plan Note (Addendum)
Stable.  No edema currently present.  Patient denies chest pain or shortness of breath.  Patient is stable on hydrochlorothiazide 25 mg p.o. daily along with concurrent dose of potassium chloride 10 mEq p.o. daily.  No recent exacerbations.

## 2017-06-22 NOTE — Assessment & Plan Note (Signed)
Stable.  No recent episodes of hypotension.  Patient remains on Coreg 6.25 mg p.o. twice daily and Lotrel 10/20 mg 1 capsule p.o. daily.

## 2017-06-22 NOTE — Assessment & Plan Note (Signed)
Stable.  FS BS daily in the early a.m. and as needed.  NCS diet.  Blood sugar stable, majority in the normal range.  No recent episodes of hypoglycemia.  No reported worsening of polyneuropathy.  Patient denies numbness/tingling of the extremities.

## 2017-06-27 ENCOUNTER — Other Ambulatory Visit
Admission: RE | Admit: 2017-06-27 | Discharge: 2017-06-27 | Disposition: A | Discharge status: Home / Self Care | Payer: Medicare HMO | Source: Ambulatory Visit | Attending: Gerontology | Admitting: Gerontology

## 2017-06-27 DIAGNOSIS — E1142 Type 2 diabetes mellitus with diabetic polyneuropathy: Secondary | ICD-10-CM | POA: Insufficient documentation

## 2017-06-27 LAB — COMPREHENSIVE METABOLIC PANEL
ALBUMIN: 3.6 g/dL (ref 3.5–5.0)
ALT: 32 U/L (ref 14–54)
AST: 31 U/L (ref 15–41)
Alkaline Phosphatase: 85 U/L (ref 38–126)
Anion gap: 10 (ref 5–15)
BILIRUBIN TOTAL: 0.6 mg/dL (ref 0.3–1.2)
BUN: 36 mg/dL — AB (ref 6–20)
CO2: 26 mmol/L (ref 22–32)
CREATININE: 0.99 mg/dL (ref 0.44–1.00)
Calcium: 9.4 mg/dL (ref 8.9–10.3)
Chloride: 107 mmol/L (ref 101–111)
GFR calc Af Amer: 59 mL/min — ABNORMAL LOW (ref >60)
GFR calc non Af Amer: 51 mL/min — ABNORMAL LOW (ref >60)
GLUCOSE: 62 mg/dL — AB (ref 65–99)
Potassium: 3.9 mmol/L (ref 3.5–5.1)
SODIUM: 143 mmol/L (ref 135–145)
TOTAL PROTEIN: 6.9 g/dL (ref 6.5–8.1)

## 2017-06-27 LAB — CBC WITH DIFFERENTIAL/PLATELET
BASOS ABS: 0 10*3/uL (ref 0–0.1)
BASOS PCT: 1 %
Eosinophils Absolute: 0.1 10*3/uL (ref 0–0.7)
Eosinophils Relative: 3 %
HEMATOCRIT: 38.4 % (ref 35.0–47.0)
HEMOGLOBIN: 12.6 g/dL (ref 12.0–16.0)
LYMPHS PCT: 43 %
Lymphs Abs: 1.1 10*3/uL (ref 1.0–3.6)
MCH: 28.8 pg (ref 26.0–34.0)
MCHC: 32.8 g/dL (ref 32.0–36.0)
MCV: 87.9 fL (ref 80.0–100.0)
MONOS PCT: 8 %
Monocytes Absolute: 0.2 10*3/uL (ref 0.2–0.9)
NEUTROS ABS: 1.1 10*3/uL — AB (ref 1.4–6.5)
NEUTROS PCT: 45 %
Platelets: 179 10*3/uL (ref 150–440)
RBC: 4.37 MIL/uL (ref 3.80–5.20)
RDW: 14.3 % (ref 11.5–14.5)
WBC: 2.6 10*3/uL — ABNORMAL LOW (ref 3.6–11.0)

## 2017-06-27 LAB — HEMOGLOBIN A1C
HEMOGLOBIN A1C: 6.1 % — AB (ref 4.8–5.6)
Mean Plasma Glucose: 128.37 mg/dL

## 2017-06-27 LAB — MAGNESIUM: Magnesium: 1.9 mg/dL (ref 1.7–2.4)

## 2017-06-27 LAB — TSH: TSH: 0.53 u[IU]/mL (ref 0.350–4.500)

## 2017-06-27 LAB — VITAMIN B12: Vitamin B-12: 394 pg/mL (ref 180–914)

## 2017-06-28 LAB — VITAMIN D 25 HYDROXY (VIT D DEFICIENCY, FRACTURES): Vit D, 25-Hydroxy: 43.5 ng/mL (ref 30.0–100.0)

## 2017-07-06 ENCOUNTER — Non-Acute Institutional Stay (SKILLED_NURSING_FACILITY): Payer: Medicare HMO | Admitting: Gerontology

## 2017-07-06 ENCOUNTER — Encounter: Payer: Self-pay | Admitting: Gerontology

## 2017-07-06 DIAGNOSIS — I63419 Cerebral infarction due to embolism of unspecified middle cerebral artery: Secondary | ICD-10-CM

## 2017-07-06 DIAGNOSIS — H40153 Residual stage of open-angle glaucoma, bilateral: Secondary | ICD-10-CM | POA: Diagnosis not present

## 2017-07-06 DIAGNOSIS — I429 Cardiomyopathy, unspecified: Secondary | ICD-10-CM

## 2017-07-07 ENCOUNTER — Encounter
Admission: RE | Admit: 2017-07-07 | Discharge: 2017-07-07 | Disposition: A | Discharge status: Home / Self Care | Payer: Medicare HMO | Source: Ambulatory Visit | Attending: Internal Medicine | Admitting: Internal Medicine

## 2017-07-17 DIAGNOSIS — I459 Conduction disorder, unspecified: Secondary | ICD-10-CM | POA: Diagnosis not present

## 2017-07-17 DIAGNOSIS — Z950 Presence of cardiac pacemaker: Secondary | ICD-10-CM | POA: Diagnosis not present

## 2017-07-17 DIAGNOSIS — Z45018 Encounter for adjustment and management of other part of cardiac pacemaker: Secondary | ICD-10-CM | POA: Diagnosis not present

## 2017-08-03 ENCOUNTER — Encounter: Payer: Self-pay | Admitting: Gerontology

## 2017-08-03 ENCOUNTER — Non-Acute Institutional Stay (SKILLED_NURSING_FACILITY): Payer: Medicare HMO | Admitting: Gerontology

## 2017-08-03 DIAGNOSIS — E876 Hypokalemia: Secondary | ICD-10-CM

## 2017-08-03 DIAGNOSIS — E785 Hyperlipidemia, unspecified: Secondary | ICD-10-CM

## 2017-08-03 DIAGNOSIS — R531 Weakness: Secondary | ICD-10-CM

## 2017-08-04 ENCOUNTER — Encounter: Admission: RE | Admit: 2017-08-04 | Payer: Medicare HMO | Source: Ambulatory Visit | Admitting: Internal Medicine

## 2017-08-11 NOTE — Assessment & Plan Note (Signed)
Stable. Mobile on unit in wheelchair. Stand/pivots for transfers. Extensive assist with transfers and ADLs.

## 2017-08-11 NOTE — Assessment & Plan Note (Signed)
Stable. No complaints of chest pain or shortness of breath. On Lipitor 20 mg po Q Day, Coreg 6.25 mg po BID

## 2017-08-11 NOTE — Assessment & Plan Note (Signed)
Stable. Continue on potassium chloride 10 meq po Q Day. Last level 1/22 was 3.9

## 2017-08-11 NOTE — Assessment & Plan Note (Signed)
Stable. No s/s of worsening weakness, increased deficits. On Eliquis 2.5 mg po BID for prophylaxis. Generalized weakness

## 2017-08-11 NOTE — Progress Notes (Signed)
Location:    Nursing Home Room Number: 319B Place of Service:  SNF (31) Provider:  Lorenso Quarry, NP-C  Rafael Bihari, MD  Patient Care Team: Rafael Bihari, MD as PCP - General (Internal Medicine)  Extended Emergency Contact Information Primary Emergency Contact: Tommie Raymond Address: [address]          [place], [place] [address] [place] of [place] Home Phone: [phone] Relation: Daughter Secondary Emergency Contact: Adria [place] of [place] Home Phone: [phone] Relation: Son  Code Status:  DNR Goals of care: Advanced Directive information Advanced Directives 08/03/2017  Does Patient Have a Medical Advance Directive? Yes  Type of Estate agent of [place];Out of facility DNR (pink MOST or yellow form)  Does patient want to make changes to medical advance directive? No - Patient declined  Copy of Healthcare Power of Attorney in Chart? Yes  Would patient like information on creating a medical advance directive? -  Pre-existing out of facility DNR order (yellow form or pink MOST form) Yellow form placed in chart (order not valid for inpatient use)     Chief Complaint  Patient presents with  . Medical Management of Chronic Issues    Routine Visit    HPI:  Pt is a 84 y.o. female seen today for medical management of chronic diseases.    Hypokalemia Stable. Continue on potassium chloride 10 meq po Q Day. Last level 1/22 was 3.9  Hyperlipidemia Stable. Symptoms managed on Lipitor 20 mg po Q Day. Denies chest pain or shortness of breath  Generalized weakness Stable. Mobile on unit in wheelchair. Stand/pivots for transfers. Extensive assist with transfers and ADLs.     Past Medical History:  Diagnosis Date  . Aortic valve sclerosis   . Bradycardia   . Cardiac pacemaker in situ    St. Jude single chamber pacemaker model 1210 SN# [id number] with St Jude 1888tc RV SN# [id number] both implanted on  04/26/2011  . CHF (congestive heart failure) (HCC)   . CVA (cerebral vascular accident) (HCC)    Right sided weakness  . Diabetes mellitus without complication (HCC)   . Dyspnea, unspecified   . Heart block    intermittent 3rd degree heart block with ventricular escape rate of approximatley 38bpm  . HFrEF (heart failure with reduced ejection fraction) (HCC)   . History of chicken pox   . History of echocardiogram 03/07/2011   Normal LV size/function. Moderate aortic root dilation, Moderate MR. Severe TR. PASP .  Marland Kitchen Hyperlipidemia, unspecified   . Hypertension   . Osteoarthritis   . Pacemaker   . Paroxysmal atrial fibrillation (HCC)    Documented in August 2016 at the time of CVA  . Stroke [facility]) 2005   Past Surgical History:  Procedure Laterality Date  . ABDOMINAL HYSTERECTOMY    . INSERT REPLACE REMOVE PACEMAKER    . JOINT REPLACEMENT Bilateral    hips  . LUMBAR FUSION  1996  . PACEMAKER PLACEMENT Left    DUAL CHAMBER PACEMAKER GENERATOR  . TOTAL KNEE ARTHROPLASTY Right 08/10/2012   Procedure: RIGHT TOTAL KNEE ARTHROPLASTY; Surgeon: Christell Faith, MD; Location: [facility] OR; Service: Orthopedics; Laterality: Right;    No Known Allergies  Allergies as of 08/03/2017   No Known Allergies     Medication List        Accurate as of 08/03/17 11:59 PM. Always use your most recent med list.          acetaminophen 500 MG  tablet Commonly known as:  TYLENOL Take 500 mg by mouth every 4 (four) hours as needed for mild pain or fever.   amLODipine-benazepril 10-20 MG capsule Commonly known as:  LOTREL Take 1 capsule by mouth daily.   apixaban 2.5 MG Tabs tablet Commonly known as:  ELIQUIS Take 1 tablet (2.5 mg total) by mouth 2 (two) times daily.   atorvastatin 20 MG tablet Commonly known as:  LIPITOR Take 1 tablet (20 mg total) by mouth daily at 6 PM.   bimatoprost 0.01 % Soln Commonly known as:  LUMIGAN Place 1 drop into both eyes at bedtime.   carvedilol  6.25 MG tablet Commonly known as:  COREG Take 1 tablet (6.25 mg total) by mouth 2 (two) times daily with a meal.   cholecalciferol 1000 units tablet Commonly known as:  VITAMIN D Take 2,000 Units by mouth daily.   hydrochlorothiazide 25 MG tablet Commonly known as:  HYDRODIURIL Take 25 mg by mouth daily.   magnesium hydroxide 400 MG/5ML suspension Commonly known as:  MILK OF MAGNESIA Take 30 mLs by mouth every 4 (four) hours as needed for mild constipation. For constipation/ no BM for 2 days   multivitamin with minerals Tabs tablet Take 1 tablet by mouth daily.   potassium chloride 10 MEQ tablet Commonly known as:  K-DUR,KLOR-CON Take 10 mEq by mouth daily.   vitamin B-12 1000 MCG tablet Commonly known as:  CYANOCOBALAMIN Take 1,000 mcg by mouth daily.       Review of Systems  Unable to perform ROS: Patient nonverbal (minimally verbal)  Constitutional: Negative for activity change, appetite change, chills, diaphoresis and fever.  HENT: Negative for congestion, mouth sores, nosebleeds, postnasal drip, sneezing, sore throat, trouble swallowing and voice change.   Respiratory: Negative for apnea, cough, choking, chest tightness, shortness of breath and wheezing.   Cardiovascular: Negative for chest pain, palpitations and leg swelling.  Gastrointestinal: Negative for abdominal distention, abdominal pain, constipation, diarrhea and nausea.  Genitourinary: Negative for difficulty urinating, dysuria, frequency and urgency.  Musculoskeletal: Positive for arthralgias (typical arthritis). Negative for back pain, gait problem and myalgias.  Skin: Negative for color change, pallor, rash and wound.  Neurological: Positive for speech difficulty and weakness. Negative for dizziness, tremors, syncope, numbness and headaches.  Psychiatric/Behavioral: Negative for agitation and behavioral problems.  All other systems reviewed and are negative.   Immunization History  Administered Date(s)  Administered  . Influenza Split 02/02/202015  . Influenza, High Dose Seasonal PF 02/23/2016  . Influenza-Unspecified 02/28/2017  . Pneumococcal Conjugate-13 02/23/2016  . Pneumococcal Polysaccharide-23 01/10/2015   Pertinent  Health Maintenance Due  Topic Date Due  . FOOT EXAM  01/11/1941  . OPHTHALMOLOGY EXAM  01/11/1941  . DEXA SCAN  01/10/1998  . HEMOGLOBIN A1C  12/25/2017  . INFLUENZA VACCINE  Completed  . PNA vac Low Risk Adult  Completed   Fall Risk  09/14/2015 09/14/2015 03/16/2015 03/16/2015  Falls in the past year? No No Yes No  Number falls in past yr: - - 1 -  Injury with Fall? - - Yes -  Risk for fall due to : Impaired balance/gait - - -  Follow up - - Education provided;Falls prevention discussed -   Functional Status Survey:    Vitals:   08/03/17 1252  BP: 122/68  Pulse: 96  Resp: 20  Temp: (!) 97 F (36.1 C)  TempSrc: Oral  SpO2: 98%  Weight: 114 lb 11.2 oz (52 kg)  Height: 5\' 2"  (1.575 m)  Body mass index is 20.98 kg/m. Physical Exam  Constitutional: She is oriented to person, place, and time. Vital signs are normal. She appears well-developed and well-nourished. She is active and cooperative. She does not appear ill. No distress.  HENT:  Head: Normocephalic and atraumatic.  Mouth/Throat: Uvula is midline, oropharynx is clear and moist and mucous membranes are normal. Mucous membranes are not pale, not dry and not cyanotic.  Eyes: Conjunctivae, EOM and lids are normal. Pupils are equal, round, and reactive to light.  Neck: Trachea normal, normal range of motion and full passive range of motion without pain. Neck supple. No JVD present. No tracheal deviation, no edema and no erythema present. No thyromegaly present.  Cardiovascular: Normal rate, regular rhythm, intact distal pulses and normal pulses. Exam reveals no gallop, no distant heart sounds and no friction rub.  Murmur heard. Pulses:      Dorsalis pedis pulses are 2+ on the right side, and 2+ on  the left side.  No edema  Pulmonary/Chest: Effort normal and breath sounds normal. No accessory muscle usage. No respiratory distress. She has no decreased breath sounds. She has no wheezes. She has no rhonchi. She has no rales. She exhibits no tenderness.  Abdominal: Soft. Normal appearance and bowel sounds are normal. She exhibits no distension and no ascites. There is no tenderness.  Musculoskeletal: Normal range of motion. She exhibits no edema or tenderness.  Expected osteoarthritis, stiffness; Bilateral Calves soft, supple. Negative Homan's Sign. B- pedal pulses equal; generalized weakness  Neurological: She is alert and oriented to person, place, and time. She has normal strength. She displays atrophy. A sensory deficit is present. She exhibits abnormal muscle tone. Coordination and gait abnormal.  Minimally verbal  Skin: Skin is warm, dry and intact. She is not diaphoretic. No cyanosis. No pallor. Nails show no clubbing.  Psychiatric: She has a normal mood and affect. Her speech is normal and behavior is normal. Judgment and thought content normal. Cognition and memory are normal.  Nursing note and vitals reviewed.   Labs reviewed: Recent Labs    12/29/16 0322 03/10/17 1329 06/27/17 0330  NA 138 140 143  K 3.6 3.6 3.9  CL 105 106 107  CO2 25 25 26   GLUCOSE 98 94 62*  BUN 14 33* 36*  CREATININE 0.93 0.92 0.99  CALCIUM 8.9 9.6 9.4  MG  --   --  1.9   Recent Labs    12/27/16 2105 03/10/17 1329 06/27/17 0330  AST 22 30 31   ALT 15 27 32  ALKPHOS 90 76 85  BILITOT 0.8 0.7 0.6  PROT 7.5 8.0 6.9  ALBUMIN 4.0 3.8 3.6   Recent Labs    12/28/16 0454 03/10/17 1329 06/27/17 0330  WBC 3.6 3.0* 2.6*  NEUTROABS  --   --  1.1*  HGB 12.6 13.1 12.6  HCT 38.2 39.8 38.4  MCV 84.1 84.7 87.9  PLT 175 203 179   Lab Results  Component Value Date   TSH 0.530 06/27/2017   Lab Results  Component Value Date   HGBA1C 6.1 (H) 06/27/2017   Lab Results  Component Value Date    CHOL 150 04/26/2016   HDL 59 04/26/2016   LDLCALC 81 04/26/2016   TRIG 51 04/26/2016   CHOLHDL 2.5 04/26/2016    Significant Diagnostic Results in last 30 days:  No results found.  Assessment/Plan Tamara Campbell was seen today for medical management of chronic issues.  Diagnoses and all orders for this visit:  Hypokalemia  Hyperlipidemia, unspecified hyperlipidemia type  Generalized weakness   Above listed conditions stable  Continue current medication regimen  Continue to mobilize on unit is wheelchair- self propelled  Continue to assist with ADLs/ transfers as appropriate  Monitor for increased muscle weakness/ spasms  Continue to encourage increased interaction with other residents and activities  Family/ staff Communication:   Total Time:  Documentation:  Face to Face:  Family/Phone:   Labs/tests ordered:  Not due  Medication list reviewed and assessed for continued appropriateness. Monthly medication orders reviewed and signed.  Brynda Rim, NP-C Geriatrics [facility] [phone] [address], [place] [address] Cell Phone (Mon-Fri 8am-5pm):  [phone] On Call:  [phone] & follow prompts after 5pm & weekends Office Phone:  [phone] Office Fax:  [phone]

## 2017-08-11 NOTE — Progress Notes (Signed)
Location:    Nursing Home Room Number: 319B Place of Service:  SNF (31) Provider:  Lorenso QuarryShannon Malikiah Debarr, NP-C  Rafael BihariWalker, John B III, MD  Patient Care Team: Rafael BihariWalker, John B III, MD as PCP - General (Internal Medicine)  Extended Emergency Contact Information Primary Emergency Contact: Tommie RaymondWilliams,Valerie W Address: [address]          [place], [place] [id number] [place] Home Phone: [phone] Relation: Daughter Secondary Emergency Contact: Adria DevonWalker,Anthony  [place] Home Phone: [phone] Relation: Son  Code Status:  DNR Goals of care: Advanced Directive information Advanced Directives 08/03/2017  Does Patient Have a Medical Advance Directive? Yes  Type of Estate agentAdvance Directive Healthcare Power of [place];Out of facility DNR (pink MOST or yellow form)  Does patient want to make changes to medical advance directive? No - Patient declined  Copy of Healthcare Power of Attorney in Chart? Yes  Would patient like information on creating a medical advance directive? -  Pre-existing out of facility DNR order (yellow form or pink MOST form) Yellow form placed in chart (order not valid for inpatient use)     Chief Complaint  Patient presents with  . Medical Management of Chronic Issues    Routine Visit    HPI:  Pt is a 84 y.o. female seen today for medical management of chronic diseases.    Cardiomyopathy Stable. No complaints of chest pain or shortness of breath. On Lipitor 20 mg po Q Day, Coreg 6.25 mg po BID  Cerebral infarction due to embolism of middle cerebral artery Stable. No s/s of worsening weakness, increased deficits. On Eliquis 2.5 mg po BID for prophylaxis. Generalized weakness  Residual stage of open-angle glaucoma, bilateral Stable. Symptoms controlled/ managed with Lumigan 1 drop each eye Q HS. No change in vision or c/o eye pain.     Past Medical History:  Diagnosis Date  . Aortic valve sclerosis   . Bradycardia   . Cardiac  pacemaker in situ    St. Jude single chamber pacemaker model 1210 SN# [id number] with St Jude 1888tc RV SN# [id number] both implanted on 04/26/2011  . CHF (congestive heart failure) (HCC)   . CVA (cerebral vascular accident) (HCC)    Right sided weakness  . Diabetes mellitus without complication (HCC)   . Dyspnea, unspecified   . Heart block    intermittent 3rd degree heart block with ventricular escape rate of approximatley 38bpm  . HFrEF (heart failure with reduced ejection fraction) (HCC)   . History of chicken pox   . History of echocardiogram 03/07/2011   Normal LV size/function. Moderate aortic root dilation, Moderate MR. Severe TR. PASP 65mmHg.  Marland Kitchen. Hyperlipidemia, unspecified   . Hypertension   . Osteoarthritis   . Pacemaker   . Paroxysmal atrial fibrillation (HCC)    Documented in August 2016 at the time of CVA  . Stroke [facility](HCC) 2005   Past Surgical History:  Procedure Laterality Date  . ABDOMINAL HYSTERECTOMY    . INSERT REPLACE REMOVE PACEMAKER    . JOINT REPLACEMENT Bilateral    hips  . LUMBAR FUSION  1996  . PACEMAKER PLACEMENT Left    DUAL CHAMBER PACEMAKER GENERATOR  . TOTAL KNEE ARTHROPLASTY Right 08/10/2012   Procedure: RIGHT TOTAL KNEE ARTHROPLASTY; Surgeon: Christell FaithPaul Francis Lachiewicz, MD; [facility]; Service: Orthopedics; Laterality: Right;    No Known Allergies  Allergies as of 07/06/2017   No Known Allergies     Medication List  Accurate as of 07/06/17 11:59 PM. Always use your most recent med list.          acetaminophen 500 MG tablet Commonly known as:  TYLENOL Take 500 mg by mouth every 4 (four) hours as needed for mild pain or fever.   amLODipine-benazepril 10-20 MG capsule Commonly known as:  LOTREL Take 1 capsule by mouth daily.   apixaban 2.5 MG Tabs tablet Commonly known as:  ELIQUIS Take 1 tablet (2.5 mg total) by mouth 2 (two) times daily.   atorvastatin 20 MG tablet Commonly known as:  LIPITOR Take 1 tablet (20 mg total) by  mouth daily at 6 PM.   bimatoprost 0.01 % Soln Commonly known as:  LUMIGAN Place 1 drop into both eyes at bedtime.   carvedilol 6.25 MG tablet Commonly known as:  COREG Take 1 tablet (6.25 mg total) by mouth 2 (two) times daily with a meal.   cholecalciferol 1000 units tablet Commonly known as:  VITAMIN D Take 2,000 Units by mouth daily.   hydrochlorothiazide 25 MG tablet Commonly known as:  HYDRODIURIL Take 25 mg by mouth daily.   magnesium hydroxide 400 MG/5ML suspension Commonly known as:  MILK OF MAGNESIA Take 30 mLs by mouth every 4 (four) hours as needed for mild constipation. For constipation/ no BM for 2 days   multivitamin with minerals Tabs tablet Take 1 tablet by mouth daily.   potassium chloride 10 MEQ tablet Commonly known as:  K-DUR,KLOR-CON Take 10 mEq by mouth daily.   vitamin B-12 1000 MCG tablet Commonly known as:  CYANOCOBALAMIN Take 1,000 mcg by mouth daily.       Review of Systems  Unable to perform ROS: Patient nonverbal (minimally verbal)  Constitutional: Negative for activity change, appetite change, chills, diaphoresis and fever.  HENT: Negative for congestion, mouth sores, nosebleeds, postnasal drip, sneezing, sore throat, trouble swallowing and voice change.   Respiratory: Negative for apnea, cough, choking, chest tightness, shortness of breath and wheezing.   Cardiovascular: Negative for chest pain, palpitations and leg swelling.  Gastrointestinal: Negative for abdominal distention, abdominal pain, constipation, diarrhea and nausea.  Genitourinary: Negative for difficulty urinating, dysuria, frequency and urgency.  Musculoskeletal: Positive for arthralgias (typical arthritis). Negative for back pain, gait problem and myalgias.  Skin: Negative for color change, pallor, rash and wound.  Neurological: Positive for speech difficulty and weakness. Negative for dizziness, tremors, syncope, numbness and headaches.  Psychiatric/Behavioral: Negative  for agitation and behavioral problems.  All other systems reviewed and are negative.   Immunization History  Administered Date(s) Administered  . Influenza Split 01/18/202015  . Influenza, High Dose Seasonal PF 02/23/2016  . Influenza-Unspecified 02/28/2017  . Pneumococcal Conjugate-13 02/23/2016  . Pneumococcal Polysaccharide-23 01/10/2015   Pertinent  Health Maintenance Due  Topic Date Due  . FOOT EXAM  01/11/1941  . OPHTHALMOLOGY EXAM  01/11/1941  . DEXA SCAN  01/10/1998  . HEMOGLOBIN A1C  12/25/2017  . INFLUENZA VACCINE  Completed  . PNA vac Low Risk Adult  Completed   Fall Risk  09/14/2015 09/14/2015 03/16/2015 03/16/2015  Falls in the past year? No No Yes No  Number falls in past yr: - - 1 -  Injury with Fall? - - Yes -  Risk for fall due to : Impaired balance/gait - - -  Follow up - - Education provided;Falls prevention discussed -   Functional Status Survey:    Vitals:   07/06/17 0933  BP: 122/71  Pulse: 81  Resp: 20  Temp: 97.6 F (  36.4 C)  TempSrc: Oral  SpO2: 100%  Weight: 119 lb 8 oz (54.2 kg)  Height: 5\' 2"  (1.575 m)   Body mass index is 21.86 kg/m. Physical Exam  Constitutional: She is oriented to person, place, and time. Vital signs are normal. She appears well-developed and well-nourished. She is active and cooperative. She does not appear ill. No distress.  HENT:  Head: Normocephalic and atraumatic.  Mouth/Throat: Uvula is midline, oropharynx is clear and moist and mucous membranes are normal. Mucous membranes are not pale, not dry and not cyanotic.  Eyes: Conjunctivae, EOM and lids are normal. Pupils are equal, round, and reactive to light.  Neck: Trachea normal, normal range of motion and full passive range of motion without pain. Neck supple. No JVD present. No tracheal deviation, no edema and no erythema present. No thyromegaly present.  Cardiovascular: Normal rate, regular rhythm, intact distal pulses and normal pulses. Exam reveals no gallop, no  distant heart sounds and no friction rub.  Murmur heard. Pulses:      Dorsalis pedis pulses are 2+ on the right side, and 2+ on the left side.  No edema  Pulmonary/Chest: Effort normal and breath sounds normal. No accessory muscle usage. No respiratory distress. She has no decreased breath sounds. She has no wheezes. She has no rhonchi. She has no rales. She exhibits no tenderness.  Abdominal: Soft. Normal appearance and bowel sounds are normal. She exhibits no distension and no ascites. There is no tenderness.  Musculoskeletal: Normal range of motion. She exhibits no edema or tenderness.  Expected osteoarthritis, stiffness; Bilateral Calves soft, supple. Negative Homan's Sign. B- pedal pulses equal; generalized weakness. Mobile on unit in wheelchair  Neurological: She is alert and oriented to person, place, and time. She has normal strength. She displays atrophy. A cranial nerve deficit and sensory deficit is present. She exhibits abnormal muscle tone. Coordination and gait abnormal.  Minimally verbal  Skin: Skin is warm, dry and intact. She is not diaphoretic. No cyanosis. No pallor. Nails show no clubbing.  Psychiatric: She has a normal mood and affect. Her speech is normal and behavior is normal. Judgment and thought content normal.  Nursing note and vitals reviewed.   Labs reviewed: Recent Labs    12/29/16 0322 03/10/17 1329 06/27/17 0330  NA 138 140 143  K 3.6 3.6 3.9  CL 105 106 107  CO2 25 25 26   GLUCOSE 98 94 62*  BUN 14 33* 36*  CREATININE 0.93 0.92 0.99  CALCIUM 8.9 9.6 9.4  MG  --   --  1.9   Recent Labs    12/27/16 2105 03/10/17 1329 06/27/17 0330  AST 22 30 31   ALT 15 27 32  ALKPHOS 90 76 85  BILITOT 0.8 0.7 0.6  PROT 7.5 8.0 6.9  ALBUMIN 4.0 3.8 3.6   Recent Labs    12/28/16 0454 03/10/17 1329 06/27/17 0330  WBC 3.6 3.0* 2.6*  NEUTROABS  --   --  1.1*  HGB 12.6 13.1 12.6  HCT 38.2 39.8 38.4  MCV 84.1 84.7 87.9  PLT 175 203 179   Lab Results    Component Value Date   TSH 0.530 06/27/2017   Lab Results  Component Value Date   HGBA1C 6.1 (H) 06/27/2017   Lab Results  Component Value Date   CHOL 150 04/26/2016   HDL 59 04/26/2016   LDLCALC 81 04/26/2016   TRIG 51 04/26/2016   CHOLHDL 2.5 04/26/2016    Significant Diagnostic Results in last  30 days:  No results found.  Assessment/Plan Larrie was seen today for medical management of chronic issues.  Diagnoses and all orders for this visit:  Cardiomyopathy, unspecified type (HCC)  Cerebral infarction due to embolism of middle cerebral artery, unspecified blood vessel laterality (HCC)  Residual stage of open-angle glaucoma, bilateral   above listed conditions stable  Continue current medication regimen  Monitor for safety  Encourage pt interaction with other residents and activities  Monitor for eye pain or altered vision  Assist with ADLs/ mobility as appropriate  Mobilize on unit in wheelchair  Family/ staff Communication:  Total Time:  Documentation:  Face to Face:  Family/Phone:   Labs/tests ordered:  Recent labs reviewed  Medication list reviewed and assessed for continued appropriateness. Monthly medication orders reviewed and signed.  Brynda Rim, NP-C Geriatrics [facility] [phone] [address], [place] [address] Cell Phone (Mon-Fri 8am-5pm):  [phone] On Call:  [phone] & follow prompts after 5pm & weekends Office Phone:  [phone] Office Fax:  [phone]

## 2017-08-11 NOTE — Assessment & Plan Note (Signed)
Stable. Symptoms controlled/ managed with Lumigan 1 drop each eye Q HS. No change in vision or c/o eye pain.

## 2017-08-11 NOTE — Assessment & Plan Note (Signed)
Stable. Symptoms managed on Lipitor 20 mg po Q Day. Denies chest pain or shortness of breath

## 2017-09-04 ENCOUNTER — Encounter
Admission: RE | Admit: 2017-09-04 | Discharge: 2017-09-04 | Disposition: A | Discharge status: Home / Self Care | Payer: Medicare HMO | Source: Ambulatory Visit | Attending: Internal Medicine | Admitting: Internal Medicine

## 2017-09-05 ENCOUNTER — Encounter: Payer: Self-pay | Admitting: Gerontology

## 2017-09-05 ENCOUNTER — Non-Acute Institutional Stay (SKILLED_NURSING_FACILITY): Payer: Medicare HMO | Admitting: Gerontology

## 2017-09-05 DIAGNOSIS — Z950 Presence of cardiac pacemaker: Secondary | ICD-10-CM

## 2017-09-05 DIAGNOSIS — I081 Rheumatic disorders of both mitral and tricuspid valves: Secondary | ICD-10-CM | POA: Diagnosis not present

## 2017-09-05 DIAGNOSIS — Z8673 Personal history of transient ischemic attack (TIA), and cerebral infarction without residual deficits: Secondary | ICD-10-CM | POA: Diagnosis not present

## 2017-09-05 NOTE — Assessment & Plan Note (Signed)
Stable. Pulse consistently in the 70's. Refer back to cardiology for issues.

## 2017-09-05 NOTE — Assessment & Plan Note (Signed)
Stable. No residual effects. No recent worsening of symptoms. On Eliquis 2.5 mg po BID.

## 2017-09-05 NOTE — Assessment & Plan Note (Signed)
Stable. No c/o chest pain or shortness of breath. On Coreg 6.25 mg po BID, Lotrel 10-20 mg po Q Day, HCTZ 25 mg Q Day with KDur 10 meq Q Day

## 2017-09-05 NOTE — Progress Notes (Signed)
Location:    Nursing Home Room Number: 319B Place of Service:  SNF (31) Provider:  Lorenso Quarry, NP-C  Rafael Bihari, MD  Patient Care Team: Rafael Bihari, MD as PCP - General (Internal Medicine)  Extended Emergency Contact Information Primary Emergency Contact: Tommie Raymond Address: [address]          [place], [place] [address] Darden [place] of [place] Home Phone: [phone] Relation: Daughter Secondary Emergency Contact: Adria Devon [place] of [place] Home Phone: [phone] Relation: Son  Code Status:  DNR Goals of care: Advanced Directive information Advanced Directives 09/05/2017  Does Patient Have a Medical Advance Directive? Yes  Type of Estate agent of [place];Out of facility DNR (pink MOST or yellow form)  Does patient want to make changes to medical advance directive? No - Patient declined  Copy of Healthcare Power of Attorney in Chart? Yes  Would patient like information on creating a medical advance directive? -  Pre-existing out of facility DNR order (yellow form or pink MOST form) Yellow form placed in chart (order not valid for inpatient use)     Chief Complaint  Patient presents with  . Medical Management of Chronic Issues    Routine Visit    HPI:  Pt is a 84 y.o. female seen today for medical management of chronic diseases.    Rheumatic disorders of both mitral and tricuspid valves Stable. No c/o chest pain or shortness of breath. On Coreg 6.25 mg po BID, Lotrel 10-20 mg po Q Day, HCTZ 25 mg Q Day with KDur 10 meq Q Day  Presence of cardiac pacemaker Stable. Pulse consistently in the 70's. Refer back to cardiology for issues.   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 No residual effects. No recent worsening of symptoms. On Eliquis 2.5 mg po BID.   Please note pt with limited verbal/cognitive ability. Unable to obtain  complete ROS. Some ROS info obtained from staff and documentation.   Past Medical History:  Diagnosis Date  . Aortic valve sclerosis   . Bradycardia   . Cardiac pacemaker in situ    St. Jude single chamber pacemaker model 1210 SN# [id number] with St Jude 1888tc RV SN# [id number] both implanted on 04/26/2011  . CHF (congestive heart failure) (HCC)   . CVA (cerebral vascular accident) (HCC)    Right sided weakness  . Diabetes mellitus without complication (HCC)   . Dyspnea, unspecified   . Heart block    intermittent 3rd degree heart block with ventricular escape rate of approximatley 38bpm  . HFrEF (heart failure with reduced ejection fraction) (HCC)   . History of chicken pox   . History of echocardiogram 03/07/2011   Normal LV size/function. Moderate aortic root dilation, Moderate MR. Severe TR. PASP .  Marland Kitchen Hyperlipidemia, unspecified   . Hypertension   . Osteoarthritis   . Pacemaker   . Paroxysmal atrial fibrillation (HCC)    Documented in August 2016 at the time of CVA  . Stroke [facility]) 2005   Past Surgical History:  Procedure Laterality Date  . ABDOMINAL HYSTERECTOMY    . INSERT REPLACE REMOVE PACEMAKER    . JOINT REPLACEMENT Bilateral    hips  . LUMBAR FUSION  1996  . PACEMAKER PLACEMENT Left    DUAL CHAMBER PACEMAKER GENERATOR  . TOTAL KNEE ARTHROPLASTY Right 08/10/2012   Procedure: RIGHT TOTAL KNEE ARTHROPLASTY; Surgeon: Christell Faith, MD; Location: [facility] OR; Service: Orthopedics; Laterality: Right;  No Known Allergies  Allergies as of 09/05/2017   No Known Allergies     Medication List        Accurate as of 09/05/17  4:48 PM. Always use your most recent med list.          acetaminophen 500 MG tablet Commonly known as:  TYLENOL Take 500 mg by mouth every 4 (four) hours as needed for mild pain or fever.   amLODipine-benazepril 10-20 MG capsule Commonly known as:  LOTREL Take 1 capsule by mouth daily.   apixaban 2.5 MG Tabs tablet Commonly  known as:  ELIQUIS Take 1 tablet (2.5 mg total) by mouth 2 (two) times daily.   atorvastatin 20 MG tablet Commonly known as:  LIPITOR Take 1 tablet (20 mg total) by mouth daily at 6 PM.   bimatoprost 0.01 % Soln Commonly known as:  LUMIGAN Place 1 drop into both eyes at bedtime.   carvedilol 6.25 MG tablet Commonly known as:  COREG Take 1 tablet (6.25 mg total) by mouth 2 (two) times daily with a meal.   cholecalciferol 1000 units tablet Commonly known as:  VITAMIN D Take 2,000 Units by mouth daily.   hydrochlorothiazide 25 MG tablet Commonly known as:  HYDRODIURIL Take 25 mg by mouth daily.   magnesium hydroxide 400 MG/5ML suspension Commonly known as:  MILK OF MAGNESIA Take 30 mLs by mouth every 4 (four) hours as needed for mild constipation. For constipation/ no BM for 2 days   multivitamin with minerals Tabs tablet Take 1 tablet by mouth daily.   potassium chloride 10 MEQ tablet Commonly known as:  K-DUR,KLOR-CON Take 10 mEq by mouth daily.   vitamin B-12 1000 MCG tablet Commonly known as:  CYANOCOBALAMIN Take 1,000 mcg by mouth daily.       Review of Systems  Constitutional: Negative for activity change, appetite change, chills, diaphoresis and fever.  HENT: Negative for congestion, mouth sores, nosebleeds, postnasal drip, sneezing, sore throat, trouble swallowing and voice change.   Respiratory: Negative for apnea, cough, choking, chest tightness, shortness of breath and wheezing.   Cardiovascular: Negative for chest pain, palpitations and leg swelling.  Gastrointestinal: Negative for abdominal distention, abdominal pain, constipation, diarrhea and nausea.  Genitourinary: Negative for difficulty urinating, dysuria, frequency and urgency.  Musculoskeletal: Positive for arthralgias (typical arthritis) and gait problem. Negative for back pain and myalgias.  Skin: Negative for color change, pallor, rash and wound.  Neurological: Positive for weakness. Negative for  dizziness, tremors, syncope, speech difficulty, numbness and headaches.  Psychiatric/Behavioral: Negative for agitation and behavioral problems.  All other systems reviewed and are negative.   Immunization History  Administered Date(s) Administered  . Influenza Split 10-23-202015  . Influenza, High Dose Seasonal PF 02/23/2016  . Influenza-Unspecified 02/28/2017  . Pneumococcal Conjugate-13 02/23/2016  . Pneumococcal Polysaccharide-23 01/10/2015   Pertinent  Health Maintenance Due  Topic Date Due  . FOOT EXAM  01/11/1941  . OPHTHALMOLOGY EXAM  01/11/1941  . DEXA SCAN  01/10/1998  . HEMOGLOBIN A1C  12/25/2017  . INFLUENZA VACCINE  01/04/2018  . PNA vac Low Risk Adult  Completed   Fall Risk  09/14/2015 09/14/2015 03/16/2015 03/16/2015  Falls in the past year? No No Yes No  Number falls in past yr: - - 1 -  Injury with Fall? - - Yes -  Risk for fall due to : Impaired balance/gait - - -  Follow up - - Education provided;Falls prevention discussed -   Functional Status Survey:  Vitals:   09/05/17 1432  BP: 118/71  Pulse: 79  Resp: 16  Temp: (!) 97 F (36.1 C)  TempSrc: Oral  SpO2: 98%  Weight: 117 lb 4.8 oz (53.2 kg)  Height: 5\' 2"  (1.575 m)   Body mass index is 21.45 kg/m. Physical Exam  Constitutional: She is oriented to person, place, and time. Vital signs are normal. She appears well-developed and well-nourished. She is active and cooperative. She does not appear ill. No distress.  HENT:  Head: Normocephalic and atraumatic.  Mouth/Throat: Uvula is midline, oropharynx is clear and moist and mucous membranes are normal. Mucous membranes are not pale, not dry and not cyanotic.  Eyes: Pupils are equal, round, and reactive to light. Conjunctivae, EOM and lids are normal.  Neck: Trachea normal, normal range of motion and full passive range of motion without pain. Neck supple. No JVD present. No tracheal deviation, no edema and no erythema present. No thyromegaly present.    Cardiovascular: Normal rate, regular rhythm, normal heart sounds, intact distal pulses and normal pulses. Exam reveals no gallop, no distant heart sounds and no friction rub.  No murmur heard. Pulses:      Dorsalis pedis pulses are 2+ on the right side, and 2+ on the left side.  No edema  Pulmonary/Chest: Effort normal and breath sounds normal. No accessory muscle usage. No respiratory distress. She has no decreased breath sounds. She has no wheezes. She has no rhonchi. She has no rales. She exhibits no tenderness.  Abdominal: Soft. Normal appearance and bowel sounds are normal. She exhibits no distension and no ascites. There is no tenderness.  Musculoskeletal: Normal range of motion. She exhibits no edema or tenderness.  Expected osteoarthritis, stiffness; Bilateral Calves soft, supple. Negative Homan's Sign. B- pedal pulses equal; generalized weakness. Mobile on the unit in wheelchair  Neurological: She is alert and oriented to person, place, and time. She has normal strength. She displays atrophy. She exhibits abnormal muscle tone. Coordination and gait abnormal.  Skin: Skin is warm, dry and intact. She is not diaphoretic. No cyanosis. No pallor. Nails show no clubbing.  Psychiatric: Her speech is normal. Judgment and thought content normal. Her affect is blunt. She is slowed. Cognition and memory are impaired. She exhibits abnormal recent memory.  Nursing note and vitals reviewed.   Labs reviewed: Recent Labs    12/29/16 0322 03/10/17 1329 06/27/17 0330  NA 138 140 143  K 3.6 3.6 3.9  CL 105 106 107  CO2 25 25 26   GLUCOSE 98 94 62*  BUN 14 33* 36*  CREATININE 0.93 0.92 0.99  CALCIUM 8.9 9.6 9.4  MG  --   --  1.9   Recent Labs    12/27/16 2105 03/10/17 1329 06/27/17 0330  AST 22 30 31   ALT 15 27 32  ALKPHOS 90 76 85  BILITOT 0.8 0.7 0.6  PROT 7.5 8.0 6.9  ALBUMIN 4.0 3.8 3.6   Recent Labs    12/28/16 0454 03/10/17 1329 06/27/17 0330  WBC 3.6 3.0* 2.6*  NEUTROABS   --   --  1.1*  HGB 12.6 13.1 12.6  HCT 38.2 39.8 38.4  MCV 84.1 84.7 87.9  PLT 175 203 179   Lab Results  Component Value Date   TSH 0.530 06/27/2017   Lab Results  Component Value Date   HGBA1C 6.1 (H) 06/27/2017   Lab Results  Component Value Date   CHOL 150 04/26/2016   HDL 59 04/26/2016   LDLCALC 81 04/26/2016  TRIG 51 04/26/2016   CHOLHDL 2.5 04/26/2016    Significant Diagnostic Results in last 30 days:  No results found.  Assessment/Plan Mikayela was seen today for medical management of chronic issues.  Diagnoses and all orders for this visit:  Rheumatic disorders of both mitral and tricuspid valves  Presence of cardiac pacemaker  Personal history of transient ischemic attack (TIA), and cerebral infarction without residual deficits   Above listed conditions stable  Continue current medication regimen  Continue to encourage pt interaction with other residents and participation in activities  F/U with Cardiology as needed  Monitor for bleeding r/t Eliquis  Family/ staff Communication:   Total Time:  Documentation:  Face to Face:  Family/Phone:   Labs/tests ordered:  Not due  Medication list reviewed and assessed for continued appropriateness. Monthly medication orders reviewed and signed.  Brynda Rim, NP-C Geriatrics [facility] [phone], [place] [address] Cell Phone (Mon-Fri 8am-5pm):  [phone] On Call:  [phone] & follow prompts after 5pm & weekends Office Phone:  [phone] Office Fax:  [phone]

## 2017-10-04 ENCOUNTER — Encounter
Admission: RE | Admit: 2017-10-04 | Discharge: 2017-10-04 | Disposition: A | Discharge status: Home / Self Care | Payer: Medicare HMO | Source: Ambulatory Visit | Attending: Internal Medicine | Admitting: Internal Medicine

## 2017-10-12 ENCOUNTER — Non-Acute Institutional Stay (SKILLED_NURSING_FACILITY): Payer: Medicare HMO | Admitting: Gerontology

## 2017-10-12 ENCOUNTER — Encounter: Payer: Self-pay | Admitting: Gerontology

## 2017-10-12 DIAGNOSIS — I480 Paroxysmal atrial fibrillation: Secondary | ICD-10-CM | POA: Diagnosis not present

## 2017-10-12 DIAGNOSIS — I110 Hypertensive heart disease with heart failure: Secondary | ICD-10-CM

## 2017-10-12 DIAGNOSIS — I5022 Chronic systolic (congestive) heart failure: Secondary | ICD-10-CM | POA: Diagnosis not present

## 2017-10-14 NOTE — Assessment & Plan Note (Signed)
Stable. No recent episodes of hyper or hypotension. No c/o chest pain or shortness of breath. No edema currently. Symptoms managed with Coreg 6.25 mg po BID and Lotrel 1-20mg  po Q Day

## 2017-10-14 NOTE — Progress Notes (Signed)
Location:    Nursing Home Room Number: 319B Place of Service:  SNF (31) Provider:  Lorenso Quarry, NP-C  Lauro Regulus, MD  Patient Care Team: Lauro Regulus, MD as PCP - General (Internal Medicine) Lorenso Quarry, NP as Nurse Practitioner (Family Medicine)  Extended Emergency Contact Information Primary Emergency Contact: Tommie Raymond Address: [address]          [place], [place] [address] Darden [place] of [place] Home Phone: [phone] Relation: Daughter Secondary Emergency Contact: Adria Devon [place] of [place] Home Phone: [phone] Relation: Son  Code Status:  DNR Goals of care: Advanced Directive information Advanced Directives 10/12/2017  Does Patient Have a Medical Advance Directive? Yes  Type of Estate agent of [place];Out of facility DNR (pink MOST or yellow form)  Does patient want to make changes to medical advance directive? No - Patient declined  Copy of Healthcare Power of Attorney in Chart? Yes  Would patient like information on creating a medical advance directive? -  Pre-existing out of facility DNR order (yellow form or pink MOST form) Yellow form placed in chart (order not valid for inpatient use)     Chief Complaint  Patient presents with  . Medical Management of Chronic Issues    Routine Visit    HPI:  Pt is a 84 y.o. female seen today for medical management of chronic diseases.    Paroxysmal atrial fibrillation (HCC) Stable. No recent exacerbations. Symptoms managed with Coreg 6.25 mg po BID. Also on Eliquis 2.5 mg po BID. No chest pain or shortness of breath  Hypertensive heart disease with heart failure (HCC) Stable. No recent episodes of hyper or hypotension. No c/o chest pain or shortness of breath. No edema currently. Symptoms managed with Coreg 6.25 mg po BID and Lotrel 1-20mg  po Q Day  Chronic systolic (congestive) heart failure (HCC) Stable. No edema. On HCTZ 25 mg po Q Day and  Potassium chloride 10 mEq po Q Day.   Please note pt with limited verbal/cognitive ability. Unable to obtain complete ROS. Some ROS info obtained from staff and documentation.   Past Medical History:  Diagnosis Date  . Aortic valve sclerosis   . Bradycardia   . Cardiac pacemaker in situ    St. Jude single chamber pacemaker model 1210 SN# [id number] with St Jude 1888tc RV SN# [id number] both implanted on 04/26/2011  . CHF (congestive heart failure) (HCC)   . CVA (cerebral vascular accident) (HCC)    Right sided weakness  . Diabetes mellitus without complication (HCC)   . Dyspnea, unspecified   . Heart block    intermittent 3rd degree heart block with ventricular escape rate of approximatley 38bpm  . HFrEF (heart failure with reduced ejection fraction) (HCC)   . History of chicken pox   . History of echocardiogram 03/07/2011   Normal LV size/function. Moderate aortic root dilation, Moderate MR. Severe TR. PASP .  Marland Kitchen Hyperlipidemia, unspecified   . Hypertension   . Osteoarthritis   . Pacemaker   . Paroxysmal atrial fibrillation (HCC)    Documented in August 2016 at the time of CVA  . Stroke [facility]) 2005   Past Surgical History:  Procedure Laterality Date  . ABDOMINAL HYSTERECTOMY    . INSERT REPLACE REMOVE PACEMAKER    . JOINT REPLACEMENT Bilateral    hips  . LUMBAR FUSION  1996  . PACEMAKER PLACEMENT Left    DUAL CHAMBER PACEMAKER GENERATOR  . TOTAL KNEE ARTHROPLASTY Right 08/10/2012   Procedure: RIGHT  TOTAL KNEE ARTHROPLASTY; Surgeon: Christell Faith, MD; Location: [facility] OR; Service: Orthopedics; Laterality: Right;    No Known Allergies  Allergies as of 10/12/2017   No Known Allergies     Medication List        Accurate as of 10/12/17 11:59 PM. Always use your most recent med list.          acetaminophen 500 MG tablet Commonly known as:  TYLENOL Take 500 mg by mouth every 4 (four) hours as needed for mild pain or fever.   amLODipine-benazepril 10-20 MG  capsule Commonly known as:  LOTREL Take 1 capsule by mouth daily.   apixaban 2.5 MG Tabs tablet Commonly known as:  ELIQUIS Take 1 tablet (2.5 mg total) by mouth 2 (two) times daily.   atorvastatin 20 MG tablet Commonly known as:  LIPITOR Take 1 tablet (20 mg total) by mouth daily at 6 PM.   bimatoprost 0.01 % Soln Commonly known as:  LUMIGAN Place 1 drop into both eyes at bedtime.   carvedilol 6.25 MG tablet Commonly known as:  COREG Take 1 tablet (6.25 mg total) by mouth 2 (two) times daily with a meal.   cholecalciferol 1000 units tablet Commonly known as:  VITAMIN D Take 2,000 Units by mouth daily.   hydrochlorothiazide 25 MG tablet Commonly known as:  HYDRODIURIL Take 25 mg by mouth daily.   magnesium hydroxide 400 MG/5ML suspension Commonly known as:  MILK OF MAGNESIA Take 30 mLs by mouth every 4 (four) hours as needed for mild constipation. For constipation/ no BM for 2 days   multivitamin with minerals Tabs tablet Take 1 tablet by mouth daily.   potassium chloride 10 MEQ tablet Commonly known as:  K-DUR,KLOR-CON Take 10 mEq by mouth daily.   vitamin B-12 1000 MCG tablet Commonly known as:  CYANOCOBALAMIN Take 1,000 mcg by mouth daily.       Review of Systems  Unable to perform ROS: Dementia  Constitutional: Negative for activity change, appetite change, chills, diaphoresis and fever.  HENT: Negative for congestion, mouth sores, nosebleeds, postnasal drip, sneezing, sore throat, trouble swallowing and voice change.   Respiratory: Negative for apnea, cough, choking, chest tightness, shortness of breath and wheezing.   Cardiovascular: Negative for chest pain, palpitations and leg swelling.  Gastrointestinal: Negative for abdominal distention, abdominal pain, constipation, diarrhea and nausea.  Genitourinary: Negative for difficulty urinating, dysuria, frequency and urgency.  Musculoskeletal: Positive for arthralgias (typical arthritis) and gait problem.  Negative for back pain and myalgias.  Skin: Negative for color change, pallor, rash and wound.  Neurological: Positive for weakness. Negative for dizziness, tremors, syncope, speech difficulty, numbness and headaches.  Psychiatric/Behavioral: Positive for dysphoric mood. Negative for agitation and behavioral problems.  All other systems reviewed and are negative.   Immunization History  Administered Date(s) Administered  . Influenza Split March 24, 202015  . Influenza, High Dose Seasonal PF 02/23/2016  . Influenza-Unspecified 02/28/2017  . Pneumococcal Conjugate-13 02/23/2016  . Pneumococcal Polysaccharide-23 01/10/2015   Pertinent  Health Maintenance Due  Topic Date Due  . FOOT EXAM  01/11/1941  . OPHTHALMOLOGY EXAM  01/11/1941  . DEXA SCAN  01/10/1998  . HEMOGLOBIN A1C  12/25/2017  . INFLUENZA VACCINE  01/04/2018  . PNA vac Low Risk Adult  Completed   Fall Risk  09/14/2015 09/14/2015 03/16/2015 03/16/2015  Falls in the past year? No No Yes No  Number falls in past yr: - - 1 -  Injury with Fall? - - Yes -  Risk for  fall due to : Impaired balance/gait - - -  Follow up - - Education provided;Falls prevention discussed -   Functional Status Survey:    Vitals:   10/12/17 0918  BP: (!) 142/89  Pulse: 81  Resp: 18  Temp: 97.8 F (36.6 C)  TempSrc: Oral  SpO2: 97%  Weight: 112 lb (50.8 kg)  Height:  (1.575 m)   Body mass index is 20.49 kg/m. Physical Exam  Constitutional: Vital signs are normal. She appears well-developed and well-nourished. She is active and cooperative. She does not appear ill. No distress.  HENT:  Head: Normocephalic and atraumatic.  Mouth/Throat: Uvula is midline, oropharynx is clear and moist and mucous membranes are normal. Mucous membranes are not pale, not dry and not cyanotic.  Eyes: Pupils are equal, round, and reactive to light. Conjunctivae, EOM and lids are normal.  Neck: Trachea normal, normal range of motion and full passive range of motion  without pain. Neck supple. No JVD present. No tracheal deviation, no edema and no erythema present. No thyromegaly present.  Cardiovascular: Normal rate, normal heart sounds, intact distal pulses and normal pulses. An irregular rhythm present. Exam reveals no gallop, no distant heart sounds and no friction rub.  No murmur heard. Pulses:      Dorsalis pedis pulses are 2+ on the right side, and 2+ on the left side.  No edema  Pulmonary/Chest: Effort normal and breath sounds normal. No accessory muscle usage. No respiratory distress. She has no decreased breath sounds. She has no wheezes. She has no rhonchi. She has no rales. She exhibits no tenderness.  Abdominal: Soft. Normal appearance and bowel sounds are normal. She exhibits no distension and no ascites. There is no tenderness.  Musculoskeletal: Normal range of motion. She exhibits no edema or tenderness.  Expected osteoarthritis, stiffness; Bilateral Calves soft, supple. Negative Homan's Sign. B- pedal pulses equal; generalized weakness; mobile in wheelchair  Neurological: She is alert. She has normal strength. She exhibits abnormal muscle tone. Coordination and gait abnormal.  Skin: Skin is warm, dry and intact. She is not diaphoretic. No cyanosis. No pallor. Nails show no clubbing.  Psychiatric: Her speech is normal. Judgment and thought content normal. Her affect is blunt. She is withdrawn. Cognition and memory are impaired. She exhibits abnormal recent memory. She is inattentive.  Nursing note and vitals reviewed.   Labs reviewed: Recent Labs    12/29/16 0322 03/10/17 1329 06/27/17 0330  NA 138 140 143  K 3.6 3.6 3.9  CL 105 106 107  CO2 GLUCOSE 98 94 62*  BUN 14 33* 36*  CREATININE 0.93 0.92 0.99  CALCIUM 8.9 9.6 9.4  MG  --   --  1.9   Recent Labs    12/27/16 2105 03/10/17 1329 06/27/17 0330  AST ALT 15 27 32  ALKPHOS 90 76 85  BILITOT 0.8 0.7 0.6  PROT 7.5 8.0 6.9  ALBUMIN 4.0 3.8 3.6   Recent  Labs    12/28/16 0454 03/10/17 1329 06/27/17 0330  WBC 3.6 3.0* 2.6*  NEUTROABS  --   --  1.1*  HGB 12.6 13.1 12.6  HCT 38.2 39.8 38.4  MCV 84.1 84.7 87.9  PLT 175 203 179   Lab Results  Component Value Date   TSH 0.530 06/27/2017   Lab Results  Component Value Date   HGBA1C 6.1 (H) 06/27/2017   Lab Results  Component Value Date   CHOL 150 04/26/2016  HDL 59 04/26/2016   LDLCALC 81 04/26/2016   TRIG 51 04/26/2016   CHOLHDL 2.5 04/26/2016    Significant Diagnostic Results in last 30 days:  No results found.  Assessment/Plan Tamara Campbell was seen today for medical management of chronic issues.  Diagnoses and all orders for this visit:  Paroxysmal atrial fibrillation (HCC)  Hypertensive heart disease with heart failure (HCC)  Chronic systolic (congestive) heart failure (HCC)   Above listed conditions stable  Continue current medication regimen  Ambulatory with wheelchair  Elevated legs when at rest  Continue to encourage participation in activities and interactions with other residents  Safety precautions  Fall precautions   Family/ staff Communication:   Total Time:  Documentation:  Face to Face:  Family/Phone:   Labs/tests ordered:  Not due  Medication list reviewed and assessed for continued appropriateness. Monthly medication orders reviewed and signed.  Brynda Rim, NP-C Geriatrics [facility] [phone] [address], [place] [address] Cell Phone (Mon-Fri 8am-5pm):  [phone] On Call:  [phone] & follow prompts after 5pm & weekends Office Phone:  [phone] Office Fax:  [phone]

## 2017-10-14 NOTE — Assessment & Plan Note (Signed)
Stable. No edema. On HCTZ 25 mg po Q Day and Potassium chloride 10 mEq po Q Day.

## 2017-10-14 NOTE — Assessment & Plan Note (Signed)
Stable. No recent exacerbations. Symptoms managed with Coreg 6.25 mg po BID. Also on Eliquis 2.5 mg po BID. No chest pain or shortness of breath

## 2017-10-17 DIAGNOSIS — Z45018 Encounter for adjustment and management of other part of cardiac pacemaker: Secondary | ICD-10-CM | POA: Diagnosis not present

## 2017-10-17 DIAGNOSIS — Z950 Presence of cardiac pacemaker: Secondary | ICD-10-CM | POA: Diagnosis not present

## 2017-10-17 DIAGNOSIS — I459 Conduction disorder, unspecified: Secondary | ICD-10-CM | POA: Diagnosis not present

## 2017-11-04 ENCOUNTER — Encounter
Admission: RE | Admit: 2017-11-04 | Discharge: 2017-11-04 | Disposition: A | Discharge status: Home / Self Care | Payer: Medicare HMO | Source: Ambulatory Visit | Attending: Internal Medicine | Admitting: Internal Medicine

## 2017-11-16 ENCOUNTER — Non-Acute Institutional Stay (SKILLED_NURSING_FACILITY): Payer: Medicare HMO | Admitting: Gerontology

## 2017-11-16 ENCOUNTER — Encounter: Payer: Self-pay | Admitting: Gerontology

## 2017-11-16 DIAGNOSIS — Z9181 History of falling: Secondary | ICD-10-CM | POA: Diagnosis not present

## 2017-11-16 DIAGNOSIS — E1142 Type 2 diabetes mellitus with diabetic polyneuropathy: Secondary | ICD-10-CM

## 2017-11-16 DIAGNOSIS — E559 Vitamin D deficiency, unspecified: Secondary | ICD-10-CM

## 2017-11-29 ENCOUNTER — Encounter: Payer: Self-pay | Admitting: Gerontology

## 2017-11-29 DIAGNOSIS — F0150 Vascular dementia without behavioral disturbance: Secondary | ICD-10-CM | POA: Insufficient documentation

## 2017-12-04 ENCOUNTER — Encounter
Admission: RE | Admit: 2017-12-04 | Discharge: 2017-12-04 | Disposition: A | Discharge status: Home / Self Care | Payer: Medicare HMO | Source: Ambulatory Visit | Attending: Internal Medicine | Admitting: Internal Medicine

## 2017-12-04 NOTE — Assessment & Plan Note (Signed)
Stable.  Patient is on cholecalciferol 2000 units p.o. Daily.  Last vitamin D level 43.5.  Lab due to be checked this coming month.

## 2017-12-04 NOTE — Progress Notes (Signed)
Location:    Nursing Home Room Number: 384T Place of Service:  SNF (31) Provider:  Toni Arthurs, NP-C  Kirk Ruths, MD  Patient Care Team: Kirk Ruths, MD as PCP - General (Internal Medicine) Toni Arthurs, NP as Nurse Practitioner (Family Medicine)  Extended Emergency Contact Information Primary Emergency Contact: Cyndee Brightly Address: [address]          [place], [place] [address] Johnnette Litter of [place] Phone: [phone] Relation: Daughter Secondary Emergency Contact: Delfin Gant [place] of [place] Phone: [phone] Relation: Son  Code Status: DNR Goals of care: Advanced Directive information Advanced Directives 11/16/2017  Does Patient Have a Medical Advance Directive? Yes  Type of Paramedic of [place];Out of facility DNR (pink MOST or yellow form)  Does patient want to make changes to medical advance directive? No - Patient declined  Copy of [place] in Chart? Yes  Would patient like information on creating a medical advance directive? -  Pre-existing out of facility DNR order (yellow form or pink MOST form) Yellow form placed in chart (order not valid for inpatient use)     Chief Complaint  Patient presents with  . Medical Management of Chronic Issues    Routine Visit    HPI:  Pt is a 84 y.o. female seen today for medical management of chronic diseases.    Diabetes mellitus with polyneuropathy (HCC) Stable.  Blood glucose checks being done daily.  Readings consistently less than 110.  No recent episodes of hyper or hypoglycemia.  No complaints of worsening neuropathy.  Denies pain.  Not on any oral diabetic medications.  Risk for falls Stable.  Generalized weakness.  Mobile with wheelchair.  No falls as of late.  Restorative nursing program  Vitamin D deficiency Stable.  Patient is on cholecalciferol 2000 units p.o. Daily.  Last vitamin D level 43.5.  Lab due to  be checked this coming month.  Please note pt with limited verbal/cognitive ability. Unable to obtain complete ROS. Some ROS info obtained from staff and documentation.    Past Medical History:  Diagnosis Date  . Aortic valve sclerosis   . Bradycardia   . Cardiac pacemaker in situ    St. Jude single chamber pacemaker model 1210 SN# [id number] with St Jude 1888tc RV SN# [id number] both implanted on 04/26/2011  . CHF (congestive heart failure) ([place])   . CVA (cerebral vascular accident) ([place])    Right sided weakness  . Diabetes mellitus without complication ([place])   . Dyspnea, unspecified   . Heart block    intermittent 3rd degree heart block with ventricular escape rate of approximatley 38bpm  . HFrEF (heart failure with reduced ejection fraction) ([place])   . History of chicken pox   . History of echocardiogram 03/07/2011   Normal LV size/function. Moderate aortic root dilation, Moderate MR. Severe TR. PASP 1mHg.  .Marland KitchenHyperlipidemia, unspecified   . Hypertension   . Osteoarthritis   . Pacemaker   . Paroxysmal atrial fibrillation ([place]    Documented in August 2016 at the time of CVA  . Stroke ([facility] 2005   Past Surgical History:  Procedure Laterality Date  . ABDOMINAL HYSTERECTOMY    . INSERT REPLACE REMOVE PACEMAKER    . JOINT REPLACEMENT Bilateral    hips  . LUMBAR FUSION  1996  . PACEMAKER PLACEMENT Left    DUAL CHAMBER PACEMAKER GENERATOR  . TOTAL KNEE ARTHROPLASTY Right 08/10/2012   Procedure: RIGHT TOTAL KNEE ARTHROPLASTY;  Surgeon: Zorita Pang, MD; Location: [place]; Service: Orthopedics; Laterality: Right;    No Known Allergies  Allergies as of 11/16/2017   No Known Allergies     Medication List        Accurate as of 11/16/17 11:59 PM. Always use your most recent med list.          acetaminophen 500 MG tablet Commonly known as:  TYLENOL Take 500 mg by mouth every 4 (four) hours as needed for mild pain or fever.   amLODipine-benazepril 10-20 MG  capsule Commonly known as:  LOTREL Take 1 capsule by mouth daily.   apixaban 2.5 MG Tabs tablet Commonly known as:  ELIQUIS Take 1 tablet (2.5 mg total) by mouth 2 (two) times daily.   atorvastatin 20 MG tablet Commonly known as:  LIPITOR Take 1 tablet (20 mg total) by mouth daily at 6 PM.   bimatoprost 0.01 % Soln Commonly known as:  LUMIGAN Place 1 drop into both eyes at bedtime.   carvedilol 6.25 MG tablet Commonly known as:  COREG Take 1 tablet (6.25 mg total) by mouth 2 (two) times daily with a meal.   cholecalciferol 1000 units tablet Commonly known as:  VITAMIN D Take 2,000 Units by mouth daily.   hydrochlorothiazide 25 MG tablet Commonly known as:  HYDRODIURIL Take 25 mg by mouth daily.   magnesium hydroxide 400 MG/5ML suspension Commonly known as:  MILK OF MAGNESIA Take 30 mLs by mouth every 4 (four) hours as needed for mild constipation. For constipation/ no BM for 2 days   multivitamin with minerals Tabs tablet Take 1 tablet by mouth daily.   potassium chloride 10 MEQ tablet Commonly known as:  K-DUR,KLOR-CON Take 10 mEq by mouth daily.   vitamin B-12 1000 MCG tablet Commonly known as:  CYANOCOBALAMIN Take 1,000 mcg by mouth daily.       Review of Systems  Unable to perform ROS: Dementia  Constitutional: Negative for activity change, appetite change, chills, diaphoresis and fever.  HENT: Negative for congestion, mouth sores, nosebleeds, postnasal drip, sneezing, sore throat, trouble swallowing and voice change.   Respiratory: Negative for apnea, cough, choking, chest tightness, shortness of breath and wheezing.   Cardiovascular: Negative for chest pain, palpitations and leg swelling.  Gastrointestinal: Negative for abdominal distention, abdominal pain, constipation, diarrhea and nausea.  Genitourinary: Negative for difficulty urinating, dysuria, frequency and urgency.  Musculoskeletal: Positive for arthralgias (typical arthritis) and gait problem.  Negative for back pain and myalgias.  Skin: Negative for color change, pallor, rash and wound.  Neurological: Positive for weakness. Negative for dizziness, tremors, syncope, speech difficulty, numbness and headaches.  Psychiatric/Behavioral: Negative for agitation and behavioral problems.  All other systems reviewed and are negative.   Immunization History  Administered Date(s) Administered  . Influenza Split 04/08/2014  . Influenza, High Dose Seasonal PF 02/23/2016  . Influenza-Unspecified 02/28/2017  . Pneumococcal Conjugate-13 02/23/2016  . Pneumococcal Polysaccharide-23 01/10/2015   Pertinent  Health Maintenance Due  Topic Date Due  . FOOT EXAM  01/11/1941  . OPHTHALMOLOGY EXAM  01/11/1941  . DEXA SCAN  01/10/1998  . HEMOGLOBIN A1C  12/25/2017  . INFLUENZA VACCINE  01/04/2018  . PNA vac Low Risk Adult  Completed   Fall Risk  09/14/2015 09/14/2015 03/16/2015 03/16/2015  Falls in the past year? No No Yes No  Number falls in past yr: - - 1 -  Injury with Fall? - - Yes -  Risk for fall due to : Impaired balance/gait - - -  Follow up - - Education provided;Falls prevention discussed -   Functional Status Survey:    Vitals:   11/16/17 1447  BP: 132/68  Pulse: 82  Resp: 18  Temp: 97.6 F (36.4 C)  TempSrc: Oral  SpO2: 96%  Weight: 116 lb 3.2 oz (52.7 kg)  Height: 5' 2"  (1.575 m)   Body mass index is 21.25 kg/m. Physical Exam  Constitutional: She is oriented to person, place, and time. Vital signs are normal. She appears well-developed and well-nourished. She is active and cooperative. She does not appear ill. No distress.  HENT:  Head: Normocephalic and atraumatic.  Mouth/Throat: Uvula is midline, oropharynx is clear and moist and mucous membranes are normal. Mucous membranes are not pale, not dry and not cyanotic.  Eyes: Pupils are equal, round, and reactive to light. Conjunctivae, EOM and lids are normal.  Neck: Trachea normal, normal range of motion and full  passive range of motion without pain. Neck supple. No JVD present. No tracheal deviation, no edema and no erythema present. No thyromegaly present.  Cardiovascular: Normal rate, regular rhythm, normal heart sounds, intact distal pulses and normal pulses. Exam reveals no gallop, no distant heart sounds and no friction rub.  No murmur heard. Pulses:      Dorsalis pedis pulses are 2+ on the right side, and 2+ on the left side.  No edema  Pulmonary/Chest: Effort normal and breath sounds normal. No accessory muscle usage. No respiratory distress. She has no decreased breath sounds. She has no wheezes. She has no rhonchi. She has no rales. She exhibits no tenderness.  Abdominal: Soft. Normal appearance and bowel sounds are normal. She exhibits no distension and no ascites. There is no tenderness.  Musculoskeletal: Normal range of motion. She exhibits no edema or tenderness.  Expected osteoarthritis, stiffness; Bilateral Calves soft, supple. Negative Homan's Sign. B- pedal pulses equal; generalized weakness, wheelchair for mobility  Neurological: She is alert and oriented to person, place, and time. She has normal strength.  Skin: Skin is warm, dry and intact. She is not diaphoretic. No cyanosis. No pallor. Nails show no clubbing.  Psychiatric: Her speech is normal. Judgment and thought content normal. Her affect is blunt. She is withdrawn. Cognition and memory are impaired. She exhibits abnormal recent memory.  Nursing note and vitals reviewed.   Labs reviewed: Recent Labs    12/29/16 0322 03/10/17 1329 06/27/17 0330  NA 138 140 143  K 3.6 3.6 3.9  CL 105 106 107  CO2 25 25 26   GLUCOSE 98 94 62*  BUN 14 33* 36*  CREATININE 0.93 0.92 0.99  CALCIUM 8.9 9.6 9.4  MG  --   --  1.9   Recent Labs    12/27/16 2105 03/10/17 1329 06/27/17 0330  AST 22 30 31   ALT 15 27 32  ALKPHOS 90 76 85  BILITOT 0.8 0.7 0.6  PROT 7.5 8.0 6.9  ALBUMIN 4.0 3.8 3.6   Recent Labs    12/28/16 0454  03/10/17 1329 06/27/17 0330  WBC 3.6 3.0* 2.6*  NEUTROABS  --   --  1.1*  HGB 12.6 13.1 12.6  HCT 38.2 39.8 38.4  MCV 84.1 84.7 87.9  PLT 175 203 179   Lab Results  Component Value Date   TSH 0.530 06/27/2017   Lab Results  Component Value Date   HGBA1C 6.1 (H) 06/27/2017   Lab Results  Component Value Date   CHOL 150 04/26/2016   HDL 59 04/26/2016   LDLCALC 81 04/26/2016  TRIG 51 04/26/2016   CHOLHDL 2.5 04/26/2016    Significant Diagnostic Results in last 30 days:  No results found.  Assessment/Plan Tamara Campbell was seen today for medical management of chronic issues.  Diagnoses and all orders for this visit:  Diabetic polyneuropathy associated with type 2 diabetes mellitus ([place])  Risk for falls  Vitamin D deficiency   Above listed condition stable  Continue current medication regimen  Assist with ADLs as appropriate  Continue restorative nursing program  Continue to monitor blood glucose daily and as needed  Monitor for signs/symptoms of hypo-or hyperglycemia  Continue to encourage participation in activities and interaction with other residents  Safety precautions  Fall precautions  Family/ staff Communication:   Total Time:  Documentation:  Face to Face:  Family/Phone:   Labs/tests ordered: CBC, met C, magnesium, TSH, vitamin B12, vitamin D, A1c  Medication list reviewed and assessed for continued appropriateness. Monthly medication orders reviewed and signed.  Vikki Ports, NP-C Geriatrics [facility] [phone], [place] [address] Cell Phone (Mon-Fri 8am-5pm):  [phone] On Call:  [phone] & follow prompts after 5pm & weekends Office Phone:  [phone] Office Fax:  [phone]

## 2017-12-04 NOTE — Assessment & Plan Note (Signed)
Stable.  Generalized weakness.  Mobile with wheelchair.  No falls as of late.  Restorative nursing program

## 2017-12-04 NOTE — Assessment & Plan Note (Signed)
Stable.  Blood glucose checks being done daily.  Readings consistently less than 110.  No recent episodes of hyper or hypoglycemia.  No complaints of worsening neuropathy.  Denies pain.  Not on any oral diabetic medications.

## 2017-12-06 ENCOUNTER — Other Ambulatory Visit
Admission: RE | Admit: 2017-12-06 | Discharge: 2017-12-06 | Disposition: A | Discharge status: Home / Self Care | Payer: Medicare HMO | Source: Other Acute Inpatient Hospital | Attending: Gerontology | Admitting: Gerontology

## 2017-12-06 DIAGNOSIS — E1142 Type 2 diabetes mellitus with diabetic polyneuropathy: Secondary | ICD-10-CM | POA: Insufficient documentation

## 2017-12-06 LAB — COMPREHENSIVE METABOLIC PANEL
ALT: 26 U/L (ref 0–44)
AST: 25 U/L (ref 15–41)
Albumin: 3.6 g/dL (ref 3.5–5.0)
Alkaline Phosphatase: 72 U/L (ref 38–126)
Anion gap: 7 (ref 5–15)
BILIRUBIN TOTAL: 0.7 mg/dL (ref 0.3–1.2)
BUN: 27 mg/dL — AB (ref 8–23)
CALCIUM: 9.2 mg/dL (ref 8.9–10.3)
CO2: 26 mmol/L (ref 22–32)
CREATININE: 0.87 mg/dL (ref 0.44–1.00)
Chloride: 110 mmol/L (ref 98–111)
GFR, EST NON AFRICAN AMERICAN: 59 mL/min — AB (ref >60)
Glucose, Bld: 86 mg/dL (ref 70–99)
Potassium: 3.7 mmol/L (ref 3.5–5.1)
Sodium: 143 mmol/L (ref 135–145)
TOTAL PROTEIN: 6.6 g/dL (ref 6.5–8.1)

## 2017-12-06 LAB — VITAMIN B12: VITAMIN B 12: 971 pg/mL — AB (ref 180–914)

## 2017-12-06 LAB — TSH: TSH: 0.971 u[IU]/mL (ref 0.350–4.500)

## 2017-12-06 LAB — CBC WITH DIFFERENTIAL/PLATELET
Basophils Absolute: 0 10*3/uL (ref 0–0.1)
Basophils Relative: 1 %
EOS PCT: 5 %
Eosinophils Absolute: 0.1 10*3/uL (ref 0–0.7)
HCT: 40.4 % (ref 35.0–47.0)
Hemoglobin: 13.3 g/dL (ref 12.0–16.0)
LYMPHS ABS: 1 10*3/uL (ref 1.0–3.6)
Lymphocytes Relative: 49 %
MCH: 29 pg (ref 26.0–34.0)
MCHC: 32.9 g/dL (ref 32.0–36.0)
MCV: 88.1 fL (ref 80.0–100.0)
MONOS PCT: 10 %
Monocytes Absolute: 0.2 10*3/uL (ref 0.2–0.9)
Neutro Abs: 0.7 10*3/uL — ABNORMAL LOW (ref 1.4–6.5)
Neutrophils Relative %: 37 %
Platelets: 179 10*3/uL (ref 150–440)
RBC: 4.59 MIL/uL (ref 3.80–5.20)
RDW: 15.2 % — ABNORMAL HIGH (ref 11.5–14.5)
WBC: 2 10*3/uL — ABNORMAL LOW (ref 3.6–11.0)

## 2017-12-06 LAB — MAGNESIUM: Magnesium: 2.3 mg/dL (ref 1.7–2.4)

## 2017-12-06 LAB — HEMOGLOBIN A1C
Hgb A1c MFr Bld: 6.3 % — ABNORMAL HIGH (ref 4.8–5.6)
Mean Plasma Glucose: 134.11 mg/dL

## 2017-12-07 LAB — VITAMIN D 25 HYDROXY (VIT D DEFICIENCY, FRACTURES): VIT D 25 HYDROXY: 41 ng/mL (ref 30.0–100.0)

## 2017-12-18 ENCOUNTER — Encounter: Payer: Self-pay | Admitting: Adult Health

## 2017-12-18 ENCOUNTER — Non-Acute Institutional Stay (SKILLED_NURSING_FACILITY): Payer: Medicare HMO | Admitting: Adult Health

## 2017-12-18 DIAGNOSIS — I480 Paroxysmal atrial fibrillation: Secondary | ICD-10-CM

## 2017-12-18 DIAGNOSIS — E785 Hyperlipidemia, unspecified: Secondary | ICD-10-CM

## 2017-12-18 DIAGNOSIS — F0150 Vascular dementia without behavioral disturbance: Secondary | ICD-10-CM | POA: Diagnosis not present

## 2017-12-18 DIAGNOSIS — E876 Hypokalemia: Secondary | ICD-10-CM | POA: Diagnosis not present

## 2017-12-18 DIAGNOSIS — I63419 Cerebral infarction due to embolism of unspecified middle cerebral artery: Secondary | ICD-10-CM

## 2017-12-18 DIAGNOSIS — I110 Hypertensive heart disease with heart failure: Secondary | ICD-10-CM

## 2017-12-18 DIAGNOSIS — E1169 Type 2 diabetes mellitus with other specified complication: Secondary | ICD-10-CM | POA: Diagnosis not present

## 2017-12-18 DIAGNOSIS — E1142 Type 2 diabetes mellitus with diabetic polyneuropathy: Secondary | ICD-10-CM

## 2017-12-18 NOTE — Progress Notes (Signed)
Location:   [facility] Room Number: 319B Place of Service:  SNF (31)   CODE STATUS: DNR  No Known Allergies  Chief Complaint  Patient presents with  . Medical Management of Chronic Issues    Hypertensive heart disease; afib; diabetes    HPI:  He is a 84 year old long term resident of this facility being seen for the management of her chronic illnesses: hypertensive heart disease; afib; diabetes. She is unable to participate in the hpi and ros. There are no reports of changes in appetite; no uncontrolled pain; no anxiety. There are no nursing concerns at this time.   Past Medical History:  Diagnosis Date  . Aortic valve sclerosis   . Bradycardia   . Cardiac pacemaker in situ    St. Jude single chamber pacemaker model 1210 SN# [id number] with St Jude 1888tc RV SN# [id number] both implanted on 04/26/2011  . CHF (congestive heart failure) (HCC)   . CVA (cerebral vascular accident) (HCC)    Right sided weakness  . Diabetes mellitus without complication (HCC)   . Dyspnea, unspecified   . Heart block    intermittent 3rd degree heart block with ventricular escape rate of approximatley 38bpm  . HFrEF (heart failure with reduced ejection fraction) (HCC)   . History of chicken pox   . History of echocardiogram 03/07/2011   Normal LV size/function. Moderate aortic root dilation, Moderate MR. Severe TR. PASP .  Marland Kitchen Hyperlipidemia, unspecified   . Hypertension   . Osteoarthritis   . Pacemaker   . Paroxysmal atrial fibrillation (HCC)    Documented in August 2016 at the time of CVA  . Stroke [facility]) 2005    Past Surgical History:  Procedure Laterality Date  . ABDOMINAL HYSTERECTOMY    . INSERT REPLACE REMOVE PACEMAKER    . JOINT REPLACEMENT Bilateral    hips  . LUMBAR FUSION  1996  . PACEMAKER PLACEMENT Left    DUAL CHAMBER PACEMAKER GENERATOR  . TOTAL KNEE ARTHROPLASTY Right 08/10/2012   Procedure: RIGHT TOTAL KNEE ARTHROPLASTY; Surgeon: Christell Faith, MD; Location: [facility] OR; Service: Orthopedics; Laterality: Right;    Social History   Socioeconomic History  . Marital status: Widowed    Spouse name: Not on file  . Number of children: 4  . Years of education: 71  . Highest education level: Not on file  Occupational History  . 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 Financial resource strain: Not on file  . Food insecurity:    Worry: Not on file    Inability: Not on file  . Transportation needs:    Medical: Not on file    Non-medical: Not on file  Tobacco Use  . Smoking status: Former Games developer  . Smokeless tobacco: Never Used  Substance and Sexual Activity  . Alcohol use: No  . Drug use: No  . Sexual activity: Not on file  Lifestyle  . Physical activity:    Days per week: Not on file    Minutes per session: Not on file  . Stress: Not on file  Relationships  . Social connections:    Talks on phone: Not on file    Gets together: Not on file    Attends religious service: Not on file    Active member of club or organization: Not on file    Attends meetings of clubs or organizations: Not on file    Relationship  status: Not on file  . Intimate partner violence:    Fear of current or ex partner: Not on file    Emotionally abused: Not on file    Physically abused: Not on file    Forced sexual activity: Not on file  Other Topics Concern  . Not on file  Social History Narrative   Admit date to [facility] 12/30/2016   Widowed   4 children   Former Smoker   No Drinks   DNR   Family History  Problem Relation Age of Onset  . Cervical cancer Mother   . Stroke Sister   . Diabetes type II Brother       VITAL SIGNS BP 128/76   Pulse 70   Temp (!) 97.4 F (36.3 C) (Oral)   Resp 18   Ht 5\' 2"  (1.575 m)   Wt 115 lb 3.2 oz (52.3 kg)   SpO2 95%   BMI 21.07 kg/m   Outpatient Encounter Medications as of 12/18/2017  Medication Sig  . acetaminophen  (TYLENOL) 500 MG tablet Take 500 mg by mouth every 4 (four) hours as needed for mild pain or fever.  Marland Kitchen amLODipine-benazepril (LOTREL) 10-20 MG capsule Take 1 capsule by mouth daily.   Marland Kitchen apixaban (ELIQUIS) 2.5 MG TABS tablet Take 1 tablet (2.5 mg total) by mouth 2 (two) times daily.  Marland Kitchen atorvastatin (LIPITOR) 20 MG tablet Take 1 tablet (20 mg total) by mouth daily at 6 PM.  . bimatoprost (LUMIGAN) 0.01 % SOLN Place 1 drop into both eyes at bedtime.   . carvedilol (COREG) 6.25 MG tablet Take 1 tablet (6.25 mg total) by mouth 2 (two) times daily with a meal.  . cholecalciferol (VITAMIN D) 1000 UNITS tablet Take 2,000 Units by mouth daily.  . hydrochlorothiazide (HYDRODIURIL) 25 MG tablet Take 25 mg by mouth daily.   . Infant Care Products [facility] EX) Apply liberal amount topically to area of skin irritation prn. OK to leave at bedside  . magnesium hydroxide (MILK OF MAGNESIA) 400 MG/5ML suspension Take 30 mLs by mouth every 4 (four) hours as needed for mild constipation. For constipation/ no BM for 2 days  . Multiple Vitamin (MULTIVITAMIN WITH MINERALS) TABS tablet Take 1 tablet by mouth daily.  . Ostomy Supplies (SECURI-T NO STING WIPE) MISC Apply 1 application topically 2 (two) times daily. Apply to both heels  . potassium chloride (K-DUR,KLOR-CON) 10 MEQ tablet Take 10 mEq by mouth daily.  Marland Kitchen UNABLE TO FIND Diet: NCS, NAS  . vitamin B-12 (CYANOCOBALAMIN) 1000 MCG tablet Take 1,000 mcg by mouth daily.   No facility-administered encounter medications on file as of 12/18/2017.      SIGNIFICANT DIAGNOSTIC EXAMS   LABS REVIEWED: TODAY:   12-06-17: wbc 2.0; hgb 13.3; hct 40.4; mcv 88.1 plt 179; glucose 86; bun 27; creat 0.87; k+ 3.7; na++ 143; ca 9.2; liver normal albumin 3.6 mag 2.3 tsh 0.971 vit B 12: 971; vit D 41.0; hgb a1c 6.3    Review of Systems  Unable to perform ROS: Dementia (unable to participate )   Physical Exam  Constitutional: She appears well-developed and well-nourished. No  distress.  Thin   Neck: No thyromegaly present.  Cardiovascular: Normal rate, regular rhythm, normal heart sounds and intact distal pulses.  Pulmonary/Chest: Effort normal and breath sounds normal. No respiratory distress.  Abdominal: Soft. Bowel sounds are normal. She exhibits no distension. There is no tenderness.  Musculoskeletal: Normal range of motion. She exhibits no edema.  Uses  wheelchair   Lymphadenopathy:    She has no cervical adenopathy.  Neurological: She is alert.  Skin: Skin is warm and dry. She is not diaphoretic.  Psychiatric: She has a normal mood and affect.      ASSESSMENT/ PLAN:  TODAY:   1. Chronic systolic heart failure: stable EF 45-50% (05-02-16): will continue coreg 6.25 mg twice daily   2. Hypertensive heart disease with heart failure: stable 128/76: will continue lotrel 10/20 mg daily coreg 6.25 mg twice daily; hctz 25 mg daily    3.  Paroxysmal atrial fibrillation: heart rate is stable: status post pacemaker insertion will continue eliquis 2.5 mg twice daily is taking coreg 6.25 mg twice daily   4. Type 2 diabetes mellitus with polyneuropathy: is stable hgb a1c 6.3; will continue to monitor her status is on ace; statin and eliquis  5. Cerebral infarction due to embolism of middle cerebral artery: is stable will continue eliquis 2.5 mg twice daily  6.  Dyslipidemia associated with type 2 diabetes mellitus: is stable will continue lipitor 20 mg daily   7.  Vascular dementia without behavioral disturbance: is without change weight is 115 pounds will monitor  8. Residual stage of open-angle glaucoma bilateral: is stable will continue lumigan to both eyes nightly   9. Hypokalemia: stable k+ 3.7 will conitnue k+ 10 meq daily    MD is aware of resident's narcotic use and is in agreement with current plan of care. We will attempt to wean resident as apropriate   Synthia Innocenteborah Tali Cleaves NP [facility]  Contact [phone] Monday through Friday 8am-  5pm  After hours call [phone]

## 2017-12-25 DIAGNOSIS — E785 Hyperlipidemia, unspecified: Secondary | ICD-10-CM

## 2017-12-25 DIAGNOSIS — E1169 Type 2 diabetes mellitus with other specified complication: Secondary | ICD-10-CM | POA: Insufficient documentation

## 2018-01-04 ENCOUNTER — Encounter
Admission: RE | Admit: 2018-01-04 | Discharge: 2018-01-04 | Disposition: A | Discharge status: Home / Self Care | Payer: Medicare HMO | Source: Ambulatory Visit | Attending: Internal Medicine | Admitting: Internal Medicine

## 2018-01-22 DIAGNOSIS — H40153 Residual stage of open-angle glaucoma, bilateral: Secondary | ICD-10-CM | POA: Diagnosis not present

## 2018-01-23 DIAGNOSIS — I459 Conduction disorder, unspecified: Secondary | ICD-10-CM | POA: Diagnosis not present

## 2018-01-23 DIAGNOSIS — Z950 Presence of cardiac pacemaker: Secondary | ICD-10-CM | POA: Diagnosis not present

## 2018-01-23 DIAGNOSIS — Z45018 Encounter for adjustment and management of other part of cardiac pacemaker: Secondary | ICD-10-CM | POA: Diagnosis not present

## 2018-01-29 DIAGNOSIS — E119 Type 2 diabetes mellitus without complications: Secondary | ICD-10-CM | POA: Diagnosis not present

## 2018-01-29 DIAGNOSIS — I4891 Unspecified atrial fibrillation: Secondary | ICD-10-CM | POA: Diagnosis not present

## 2018-01-29 DIAGNOSIS — I429 Cardiomyopathy, unspecified: Secondary | ICD-10-CM | POA: Diagnosis not present

## 2018-01-29 DIAGNOSIS — E441 Mild protein-calorie malnutrition: Secondary | ICD-10-CM | POA: Diagnosis not present

## 2018-01-29 DIAGNOSIS — I5022 Chronic systolic (congestive) heart failure: Secondary | ICD-10-CM | POA: Diagnosis not present

## 2018-01-29 DIAGNOSIS — Z789 Other specified health status: Secondary | ICD-10-CM | POA: Diagnosis not present

## 2018-01-29 DIAGNOSIS — I77810 Thoracic aortic ectasia: Secondary | ICD-10-CM | POA: Diagnosis not present

## 2018-01-29 DIAGNOSIS — Z0000 Encounter for general adult medical examination without abnormal findings: Secondary | ICD-10-CM | POA: Diagnosis not present

## 2018-01-31 ENCOUNTER — Encounter: Payer: Self-pay | Admitting: Adult Health

## 2018-01-31 ENCOUNTER — Non-Acute Institutional Stay (SKILLED_NURSING_FACILITY): Payer: Medicare HMO | Admitting: Adult Health

## 2018-01-31 DIAGNOSIS — I5022 Chronic systolic (congestive) heart failure: Secondary | ICD-10-CM

## 2018-01-31 DIAGNOSIS — I110 Hypertensive heart disease with heart failure: Secondary | ICD-10-CM

## 2018-01-31 DIAGNOSIS — I480 Paroxysmal atrial fibrillation: Secondary | ICD-10-CM

## 2018-01-31 DIAGNOSIS — E1142 Type 2 diabetes mellitus with diabetic polyneuropathy: Secondary | ICD-10-CM | POA: Diagnosis not present

## 2018-01-31 NOTE — Progress Notes (Signed)
Location:   [facility] Room Number: 319B Place of Service:  SNF (31)   CODE STATUS: DNR  No Known Allergies  Chief Complaint  Patient presents with  . Medical Management of Chronic Issues    Chf; hypertensive heart disease; afib; diabetes.     HPI:  She is a 84 year old long term resident of this facility being seen for the management of her chronic illnesses: hypertensive heart disease; chf; afib; diabetes. She is unable to participate in the phi or ros. there are no reports of chest pain or shortness of breath. There are no reports of appetite changes. There are no nursing concerns at this time.   Past Medical History:  Diagnosis Date  . Aortic valve sclerosis   . Bradycardia   . Cardiac pacemaker in situ    St. Jude single chamber pacemaker model 1210 SN# [id number] with St Jude 1888tc RV SN# [id number] both implanted on 04/26/2011  . CHF (congestive heart failure) (HCC)   . CVA (cerebral vascular accident) (HCC)    Right sided weakness  . Diabetes mellitus without complication (HCC)   . Dyspnea, unspecified   . Heart block    intermittent 3rd degree heart block with ventricular escape rate of approximatley 38bpm  . HFrEF (heart failure with reduced ejection fraction) (HCC)   . History of chicken pox   . History of echocardiogram 03/07/2011   Normal LV size/function. Moderate aortic root dilation, Moderate MR. Severe TR. PASP .  Marland Kitchen Hyperlipidemia, unspecified   . Hypertension   . Osteoarthritis   . Pacemaker   . Paroxysmal atrial fibrillation (HCC)    Documented in August 2016 at the time of CVA  . Stroke [facility]) 2005    Past Surgical History:  Procedure Laterality Date  . ABDOMINAL HYSTERECTOMY    . INSERT REPLACE REMOVE PACEMAKER    . JOINT REPLACEMENT Bilateral    hips  . LUMBAR FUSION  1996  . PACEMAKER PLACEMENT Left    DUAL CHAMBER PACEMAKER GENERATOR  . TOTAL KNEE ARTHROPLASTY Right 08/10/2012   Procedure: RIGHT TOTAL KNEE  ARTHROPLASTY; Surgeon: Christell Faith, MD; Location: [facility] OR; Service: Orthopedics; Laterality: Right;    Social History   Socioeconomic History  . Marital status: Widowed    Spouse name: Not on file  . Number of children: 4  . Years of education: 17  . Highest education level: Not on file  Occupational History  . 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 Financial resource strain: Not on file  . Food insecurity:    Worry: Not on file    Inability: Not on file  . Transportation needs:    Medical: Not on file    Non-medical: Not on file  Tobacco Use  . Smoking status: Former Games developer  . Smokeless tobacco: Never Used  Substance and Sexual Activity  . Alcohol use: No  . Drug use: No  . Sexual activity: Not on file  Lifestyle  . Physical activity:    Days per week: Not on file    Minutes per session: Not on file  . Stress: Not on file  Relationships  . Social connections:    Talks on phone: Not on file    Gets together: Not on file    Attends religious service: Not on file    Active member of club or organization: Not on file    Attends meetings of clubs or  organizations: Not on file    Relationship status: Not on file  . Intimate partner violence:    Fear of current or ex partner: Not on file    Emotionally abused: Not on file    Physically abused: Not on file    Forced sexual activity: Not on file  Other Topics Concern  . Not on file  Social History Narrative   [facility]   Widowed   4 children   Former Smoker   No Drinks   DNR   Family History  Problem Relation Age of Onset  . Cervical cancer Mother   . Stroke Sister   . Diabetes type II Brother       VITAL SIGNS BP (!) 148/72   Pulse 75   Temp 98 F (36.7 C) (Oral)   Resp 16   Ht 5\' 2"  (1.575 m)   Wt 122 lb 3.2 oz (55.4 kg)   SpO2 97%   BMI 22.35 kg/m   Outpatient Encounter Medications as of 01/31/2018    Medication Sig  . acetaminophen (TYLENOL) 500 MG tablet Take 500 mg by mouth every 4 (four) hours as needed for mild pain or fever.  Marland Kitchen. amLODipine-benazepril (LOTREL) 10-20 MG capsule Take 1 capsule by mouth daily.   Marland Kitchen. apixaban (ELIQUIS) 2.5 MG TABS tablet Take 1 tablet (2.5 mg total) by mouth 2 (two) times daily.  Marland Kitchen. atorvastatin (LIPITOR) 20 MG tablet Take 1 tablet (20 mg total) by mouth daily at 6 PM.  . bimatoprost (LUMIGAN) 0.01 % SOLN Place 1 drop into both eyes at bedtime.   . carvedilol (COREG) 6.25 MG tablet Take 1 tablet (6.25 mg total) by mouth 2 (two) times daily with a meal.  . cholecalciferol (VITAMIN D) 1000 UNITS tablet Take 2,000 Units by mouth daily.  . hydrochlorothiazide (HYDRODIURIL) 25 MG tablet Take 25 mg by mouth daily.   . Infant Care Products [facility](DERMACLOUD EX) Apply liberal amount topically to area of skin irritation prn. OK to leave at bedside  . magnesium hydroxide (MILK OF MAGNESIA) 400 MG/5ML suspension Take 30 mLs by mouth every 4 (four) hours as needed for mild constipation. For constipation/ no BM for 2 days  . Multiple Vitamin (MULTIVITAMIN WITH MINERALS) TABS tablet Take 1 tablet by mouth daily.  . Ostomy Supplies (SECURI-T NO STING WIPE) MISC Apply 1 application topically 2 (two) times daily. Apply to both heels  . potassium chloride (K-DUR,KLOR-CON) 10 MEQ tablet Take 10 mEq by mouth daily.  Marland Kitchen. UNABLE TO FIND Diet: NCS, NAS  . vitamin B-12 (CYANOCOBALAMIN) 1000 MCG tablet Take 1,000 mcg by mouth daily.   No facility-administered encounter medications on file as of 01/31/2018.      SIGNIFICANT DIAGNOSTIC EXAMS  LABS REVIEWED: PREVIOUS:   12-06-17: wbc 2.0; hgb 13.3; hct 40.4; mcv 88.1 plt 179; glucose 86; bun 27; creat 0.87; k+ 3.7; na++ 143; ca 9.2; liver normal albumin 3.6 mag 2.3 tsh 0.971 vit B 12: 971; vit D 41.0; hgb a1c 6.3   NO NEW LABS.   Review of Systems  Unable to perform ROS: Dementia (unable to participate )   Physical Exam  Constitutional:  She appears well-developed and well-nourished. No distress.  thin  Neck: No thyromegaly present.  Cardiovascular: Normal rate, regular rhythm, normal heart sounds and intact distal pulses.  Pulmonary/Chest: Effort normal and breath sounds normal. No respiratory distress.  Abdominal: Soft. Bowel sounds are normal. She exhibits no distension. There is no tenderness.  Musculoskeletal: Normal range of motion. She exhibits no edema.  Uses wheelchair   Lymphadenopathy:    She has no cervical adenopathy.  Neurological: She is alert.  Skin: Skin is warm and dry. She is not diaphoretic.  Psychiatric: She has a normal mood and affect.     ASSESSMENT/ PLAN:  TODAY:   1. Chronic systolic heart failure: stable EF 45-50% (05-02-16): will continue coreg 6.25 mg twice daily   2. Hypertensive heart disease with heart failure: stable 148/72: will continue lotrel 10/20 mg daily coreg 6.25 mg twice daily; hctz 25 mg daily    3.  Paroxysmal atrial fibrillation: heart rate is stable: status post pacemaker insertion will continue eliquis 2.5 mg twice daily is taking coreg 6.25 mg twice daily for rate control   4. Type 2 diabetes mellitus with polyneuropathy: is stable hgb a1c 6.3; will continue to monitor her status is on ace; statin and eliquis  PREVIOUS  5. Cerebral infarction due to embolism of middle cerebral artery: is stable will continue eliquis 2.5 mg twice daily  6.  Dyslipidemia associated with type 2 diabetes mellitus: is stable will continue lipitor 20 mg daily   7.  Vascular dementia without behavioral disturbance: is without change weight is 122 pounds will monitor  8. Residual stage of open-angle glaucoma bilateral: is stable will continue lumigan to both eyes nightly   9. Hypokalemia: stable k+ 3.7 will conitnue k+ 10 meq daily    Will need eye exam; will check cbg weekly will check lipids Her vit B 12 is 971 will stop supplement at this time.    MD is aware of resident's  narcotic use and is in agreement with current plan of care. We will attempt to wean resident as apropriate   Synthia Innocent NP [facility]  Contact [phone] Monday through Friday 8am- 5pm  After hours call [phone]

## 2018-02-01 ENCOUNTER — Other Ambulatory Visit
Admission: RE | Admit: 2018-02-01 | Discharge: 2018-02-01 | Disposition: A | Discharge status: Home / Self Care | Payer: Medicare HMO | Source: Ambulatory Visit | Attending: Adult Health | Admitting: Adult Health

## 2018-02-01 DIAGNOSIS — E1142 Type 2 diabetes mellitus with diabetic polyneuropathy: Secondary | ICD-10-CM | POA: Diagnosis not present

## 2018-02-01 LAB — LIPID PANEL
Cholesterol: 116 mg/dL (ref 0–200)
HDL: 50 mg/dL (ref >40)
LDL CALC: 58 mg/dL (ref 0–99)
TRIGLYCERIDES: 38 mg/dL (ref <150)
Total CHOL/HDL Ratio: 2.3 RATIO
VLDL: 8 mg/dL (ref 0–40)

## 2018-02-04 ENCOUNTER — Encounter
Admission: RE | Admit: 2018-02-04 | Discharge: 2018-02-04 | Disposition: A | Discharge status: Home / Self Care | Payer: Medicare HMO | Source: Ambulatory Visit | Attending: Internal Medicine | Admitting: Internal Medicine

## 2018-03-05 ENCOUNTER — Non-Acute Institutional Stay (SKILLED_NURSING_FACILITY): Payer: Medicare HMO | Admitting: Adult Health

## 2018-03-05 ENCOUNTER — Encounter: Payer: Self-pay | Admitting: Adult Health

## 2018-03-05 DIAGNOSIS — I63419 Cerebral infarction due to embolism of unspecified middle cerebral artery: Secondary | ICD-10-CM

## 2018-03-05 DIAGNOSIS — F0150 Vascular dementia without behavioral disturbance: Secondary | ICD-10-CM | POA: Diagnosis not present

## 2018-03-05 DIAGNOSIS — E785 Hyperlipidemia, unspecified: Secondary | ICD-10-CM

## 2018-03-05 DIAGNOSIS — H40153 Residual stage of open-angle glaucoma, bilateral: Secondary | ICD-10-CM

## 2018-03-05 DIAGNOSIS — E1169 Type 2 diabetes mellitus with other specified complication: Secondary | ICD-10-CM

## 2018-03-05 NOTE — Progress Notes (Signed)
[facility] Room Number: 319 B Place of Service:  SNF (31)   CODE STATUS: DNR  No Known Allergies  Chief Complaint  Patient presents with  . Medical Management of Chronic Issues    Cva; dyslipidemia; dementia; glaucoma    HPI:  She is a 84 year old long term resident of this facility being seen for the management of her chronic illnesses: cva; dyslipidemia; dementia; glaucoma. She is unable to fully participate in the hpi or ros. There are no reports of changes in appetite; no indications of anxiety; no uncontrolled pain.   Past Medical History:  Diagnosis Date  . Aortic valve sclerosis   . Bradycardia   . Cardiac pacemaker in situ    St. Jude single chamber pacemaker model 1210 SN# [id number] with St Jude 1888tc RV SN# [id number] both implanted on 04/26/2011  . CHF (congestive heart failure) (HCC)   . CVA (cerebral vascular accident) (HCC)    Right sided weakness  . Diabetes mellitus without complication (HCC)   . Dyspnea, unspecified   . Heart block    intermittent 3rd degree heart block with ventricular escape rate of approximatley 38bpm  . HFrEF (heart failure with reduced ejection fraction) (HCC)   . History of chicken pox   . History of echocardiogram 03/07/2011   Normal LV size/function. Moderate aortic root dilation, Moderate MR. Severe TR. PASP .  Marland Kitchen Hyperlipidemia, unspecified   . Hypertension   . Osteoarthritis   . Pacemaker   . Paroxysmal atrial fibrillation (HCC)    Documented in August 2016 at the time of CVA  . Stroke [facility]) 2005    Past Surgical History:  Procedure Laterality Date  . ABDOMINAL HYSTERECTOMY    . INSERT REPLACE REMOVE PACEMAKER    . JOINT REPLACEMENT Bilateral    hips  . LUMBAR FUSION  1996  . PACEMAKER PLACEMENT Left    DUAL CHAMBER PACEMAKER GENERATOR  . TOTAL KNEE ARTHROPLASTY Right 08/10/2012   Procedure: RIGHT TOTAL KNEE ARTHROPLASTY; Surgeon: Christell Faith, MD; Location: [facility]  OR; Service: Orthopedics; Laterality: Right;    Social History   Socioeconomic History  . Marital status: Widowed    Spouse name: Not on file  . Number of children: 4  . Years of education: 4  . Highest education level: Not on file  Occupational History  . 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 Financial resource strain: Not on file  . Food insecurity:    Worry: Not on file    Inability: Not on file  . Transportation needs:    Medical: Not on file    Non-medical: Not on file  Tobacco Use  . Smoking status: Former Games developer  . Smokeless tobacco: Never Used  Substance and Sexual Activity  . Alcohol use: No  . Drug use: No  . Sexual activity: Not on file  Lifestyle  . Physical activity:    Days per week: Not on file    Minutes per session: Not on file  . Stress: Not on file  Relationships  . Social connections:    Talks on phone: Not on file    Gets together: Not on file    Attends religious service: Not on file    Active member of club or organization: Not on file    Attends meetings of clubs or organizations: Not on file    Relationship status: Not on file  .  Intimate partner violence:    Fear of current or ex partner: Not on file    Emotionally abused: Not on file    Physically abused: Not on file    Forced sexual activity: Not on file  Other Topics Concern  . Not on file  Social History Narrative   [facility]   Widowed   4 children   Former Smoker   No Drinks   DNR   Family History  Problem Relation Age of Onset  . Cervical cancer Mother   . Stroke Sister   . Diabetes type II Brother       VITAL SIGNS BP 118/68   Pulse 91   Temp (!) 97.1 F (36.2 C)   Resp 20   Ht 5\' 2"  (1.575 m)   Wt 115 lb 3.2 oz (52.3 kg)   SpO2 96%   BMI 21.07 kg/m   Outpatient Encounter Medications as of 03/05/2018  Medication Sig  . acetaminophen (TYLENOL) 500 MG tablet Take 500 mg by  mouth every 4 (four) hours as needed for mild pain or fever.  Marland Kitchen amLODipine-benazepril (LOTREL) 10-20 MG capsule Take 1 capsule by mouth daily.   Marland Kitchen apixaban (ELIQUIS) 2.5 MG TABS tablet Take 1 tablet (2.5 mg total) by mouth 2 (two) times daily.  . bimatoprost (LUMIGAN) 0.01 % SOLN Place 1 drop into both eyes at bedtime.   . carvedilol (COREG) 6.25 MG tablet Take 1 tablet (6.25 mg total) by mouth 2 (two) times daily with a meal.  . hydrochlorothiazide (HYDRODIURIL) 25 MG tablet Take 25 mg by mouth daily.   . Infant Care Products [facility] EX) Apply liberal amount topically to area of skin irritation prn. OK to leave at bedside  . magnesium hydroxide (MILK OF MAGNESIA) 400 MG/5ML suspension Take 30 mLs by mouth every 4 (four) hours as needed for mild constipation. For constipation/ no BM for 2 days  . Multiple Vitamin (MULTIVITAMIN WITH MINERALS) TABS tablet Take 1 tablet by mouth daily.  . Ostomy Supplies (SECURI-T NO STING WIPE) MISC Apply 1 application topically 2 (two) times daily. Apply to both heels  . potassium chloride (K-DUR,KLOR-CON) 10 MEQ tablet Take 10 mEq by mouth daily.  Marland Kitchen UNABLE TO FIND Diet: NCS, NAS  . [DISCONTINUED] atorvastatin (LIPITOR) 20 MG tablet Take 1 tablet (20 mg total) by mouth daily at 6 PM. (Patient not taking: Reported on 03/05/2018)  . [DISCONTINUED] cholecalciferol (VITAMIN D) 1000 UNITS tablet Take 2,000 Units by mouth daily.  . [DISCONTINUED] vitamin B-12 (CYANOCOBALAMIN) 1000 MCG tablet Take 1,000 mcg by mouth daily.   No facility-administered encounter medications on file as of 03/05/2018.      SIGNIFICANT DIAGNOSTIC EXAMS  LABS REVIEWED: PREVIOUS:   12-06-17: wbc 2.0; hgb 13.3; hct 40.4; mcv 88.1 plt 179; glucose 86; bun 27; creat 0.87; k+ 3.7; na++ 143; ca 9.2; liver normal albumin 3.6 mag 2.3 tsh 0.971 vit B 12: 971; vit D 41.0; hgb a1c 6.3   TODAY:   02-01-18: chol 116; ldl 58; trig 38; hdl 50     Review of Systems  Unable to perform ROS: Dementia  (unable to participate )    Physical Exam  Constitutional: She appears well-developed and well-nourished. No distress.  Neck: No thyromegaly present.  Cardiovascular: Normal rate, regular rhythm, normal heart sounds and intact distal pulses.  Pulmonary/Chest: Effort normal and breath sounds normal. No respiratory distress.  Abdominal: Soft. Bowel sounds are normal. She exhibits no distension. There is  no tenderness.  Musculoskeletal: Normal range of motion. She exhibits no edema.  Uses wheelchair   Lymphadenopathy:    She has no cervical adenopathy.  Neurological: She is alert.  Skin: Skin is warm and dry. She is not diaphoretic.  Psychiatric: She has a normal mood and affect.    ASSESSMENT/ PLAN:  TODAY:   1. Cerebral infarction due to embolism of middle cerebral artery: is stable will continue eliquis 2.5 mg twice daily  2.  Dyslipidemia associated with type 2 diabetes mellitus: is stable  LDL 58; lipitor has been stopped   3.  Vascular dementia without behavioral disturbance: is without change weight is 115 pounds will monitor  4. Residual stage of open-angle glaucoma bilateral: is stable will continue lumigan to both eyes nightly   PREVIOUS   5. Hypokalemia: stable k+ 3.7 will conitnue k+ 10 meq daily   6. Chronic systolic heart failure: stable EF 45-50% (05-02-16): will continue coreg 6.25 mg twice daily   7. Hypertensive heart disease with heart failure: stable 116/68: will continue lotrel 10/20 mg daily coreg 6.25 mg twice daily; hctz 25 mg daily    8.  Paroxysmal atrial fibrillation: heart rate is stable: status post pacemaker insertion will continue eliquis 2.5 mg twice daily is taking coreg 6.25 mg twice daily for rate control   9. Type 2 diabetes mellitus with polyneuropathy: is stable hgb a1c 6.3; will continue to monitor her status is on ace; statin and eliquis    MD is aware of resident's narcotic use and is in agreement with current plan of care. We will  attempt to wean resident as apropriate   Synthia Innocent NP [facility]  Contact [phone] Monday through Friday 8am- 5pm  After hours call [phone]

## 2018-03-06 ENCOUNTER — Encounter
Admission: RE | Admit: 2018-03-06 | Discharge: 2018-03-06 | Disposition: A | Discharge status: Home / Self Care | Payer: Medicare HMO | Source: Ambulatory Visit | Attending: Internal Medicine | Admitting: Internal Medicine

## 2018-04-06 ENCOUNTER — Encounter
Admission: RE | Admit: 2018-04-06 | Discharge: 2018-04-06 | Disposition: A | Discharge status: Home / Self Care | Payer: Medicare HMO | Source: Ambulatory Visit | Attending: Internal Medicine | Admitting: Internal Medicine

## 2018-04-09 ENCOUNTER — Encounter: Payer: Self-pay | Admitting: Adult Health

## 2018-04-09 ENCOUNTER — Non-Acute Institutional Stay (SKILLED_NURSING_FACILITY): Payer: 59 | Admitting: Adult Health

## 2018-04-09 DIAGNOSIS — I110 Hypertensive heart disease with heart failure: Secondary | ICD-10-CM | POA: Diagnosis not present

## 2018-04-09 DIAGNOSIS — E876 Hypokalemia: Secondary | ICD-10-CM

## 2018-04-09 DIAGNOSIS — I480 Paroxysmal atrial fibrillation: Secondary | ICD-10-CM | POA: Diagnosis not present

## 2018-04-09 DIAGNOSIS — I5022 Chronic systolic (congestive) heart failure: Secondary | ICD-10-CM | POA: Diagnosis not present

## 2018-04-09 NOTE — Progress Notes (Signed)
[facility]   [facility] Room Number: 319 B Place of Service:  SNF (31)   CODE STATUS: DNR  No Known Allergies  Chief Complaint  Patient presents with  . Medical Management of Chronic Issues    Hypertensive heart disease with heart failure; paroxysmal atrial fibrillation; chronic systolic (congestive) heart failure; hypokalemia.     HPI:  She is a 84 year old long term resident of this facility being seen for the management of her chronic illnesses: hypertensive heart disease; afib; chf; hypokalemia. She is unable to participate in the hpi or ros. There are no reports of uncontrolled pain; no lower extremity edema; no changes in appetite; no indications of anxiety or agitation.    Past Medical History:  Diagnosis Date  . Aortic valve sclerosis   . Bradycardia   . Cardiac pacemaker in situ    St. Jude single chamber pacemaker model 1210 SN# [id number] with St Jude 1888tc RV SN# [id number] both implanted on 04/26/2011  . CHF (congestive heart failure) (HCC)   . CVA (cerebral vascular accident) (HCC)    Right sided weakness  . Diabetes mellitus without complication (HCC)   . Dyspnea, unspecified   . Heart block    intermittent 3rd degree heart block with ventricular escape rate of approximatley 38bpm  . HFrEF (heart failure with reduced ejection fraction) (HCC)   . History of chicken pox   . History of echocardiogram 03/07/2011   Normal LV size/function. Moderate aortic root dilation, Moderate MR. Severe TR. PASP .  Marland Kitchen Hyperlipidemia, unspecified   . Hypertension   . Osteoarthritis   . Pacemaker   . Paroxysmal atrial fibrillation (HCC)    Documented in August 2016 at the time of CVA  . Stroke [facility]) 2005    Past Surgical History:  Procedure Laterality Date  . ABDOMINAL HYSTERECTOMY    . INSERT REPLACE REMOVE PACEMAKER    . JOINT REPLACEMENT Bilateral    hips  . LUMBAR FUSION  1996  . PACEMAKER PLACEMENT Left    DUAL CHAMBER PACEMAKER  GENERATOR  . TOTAL KNEE ARTHROPLASTY Right 08/10/2012   Procedure: RIGHT TOTAL KNEE ARTHROPLASTY; Surgeon: Tamara Faith, MD; Location: [facility] OR; Service: Orthopedics; Laterality: Right;    Social History   Socioeconomic History  . Marital status: Widowed    Spouse name: Not on file  . Number of children: 4  . Years of education: 41  . Highest education level: Not on file  Occupational History  . 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 Financial resource strain: Not on file  . Food insecurity:    Worry: Not on file    Inability: Not on file  . Transportation needs:    Medical: Not on file    Non-medical: Not on file  Tobacco Use  . Smoking status: Former Games developer  . Smokeless tobacco: Never Used  Substance and Sexual Activity  . Alcohol use: No  . Drug use: No  . Sexual activity: Not on file  Lifestyle  . Physical activity:    Days per week: Not on file    Minutes per session: Not on file  . Stress: Not on file  Relationships  . Social connections:    Talks on phone: Not on file    Gets together: Not on file    Attends religious service: Not on file    Active member of club or organization: Not on file  Attends meetings of clubs or organizations: Not on file    Relationship status: Not on file  . Intimate partner violence:    Fear of current or ex partner: Not on file    Emotionally abused: Not on file    Physically abused: Not on file    Forced sexual activity: Not on file  Other Topics Concern  . Not on file  Social History Narrative   [facility]   Widowed   4 children   Former Smoker   No Drinks   DNR   Family History  Problem Relation Age of Onset  . Cervical cancer Mother   . Stroke Sister   . Diabetes type II Brother       VITAL SIGNS BP (!) 132/96   Pulse (!) 18   Temp 98.1 F (36.7 C)   Resp 18   Ht 5\' 2"  (1.575 m)   Wt 120 lb 8 oz (54.7 kg)   SpO2  99%   BMI 22.04 kg/m   Outpatient Encounter Medications as of 04/09/2018  Medication Sig  . acetaminophen (TYLENOL) 500 MG tablet Take 500 mg by mouth every 4 (four) hours as needed for mild pain or fever.  Marland Kitchen amLODipine-benazepril (LOTREL) 10-20 MG capsule Take 1 capsule by mouth daily.   Marland Kitchen apixaban (ELIQUIS) 2.5 MG TABS tablet Take 1 tablet (2.5 mg total) by mouth 2 (two) times daily.  . bimatoprost (LUMIGAN) 0.01 % SOLN Place 1 drop into both eyes at bedtime.   . carvedilol (COREG) 6.25 MG tablet Take 1 tablet (6.25 mg total) by mouth 2 (two) times daily with a meal.  . hydrochlorothiazide (HYDRODIURIL) 25 MG tablet Take 25 mg by mouth daily.   . Infant Care Products [facility] EX) Apply liberal amount topically to area of skin irritation prn. OK to leave at bedside  . magnesium hydroxide (MILK OF MAGNESIA) 400 MG/5ML suspension Take 30 mLs by mouth every 4 (four) hours as needed for mild constipation. For constipation/ no BM for 2 days  . Multiple Vitamin (MULTIVITAMIN WITH MINERALS) TABS tablet Take 1 tablet by mouth daily.  . potassium chloride (K-DUR,KLOR-CON) 10 MEQ tablet Take 10 mEq by mouth daily.  . Skin Protectants, Misc. (NO-STING SKIN-PREP EX) Apply 1 application to both heels topically two times daily  . UNABLE TO FIND Diet: NCS, NAS  . [DISCONTINUED] Ostomy Supplies (SECURI-T NO STING WIPE) MISC Apply 1 application topically 2 (two) times daily. Apply to both heels   No facility-administered encounter medications on file as of 04/09/2018.      SIGNIFICANT DIAGNOSTIC EXAMS  LABS REVIEWED: PREVIOUS:   12-06-17: wbc 2.0; hgb 13.3; hct 40.4; mcv 88.1 plt 179; glucose 86; bun 27; creat 0.87; k+ 3.7; na++ 143; ca 9.2; liver normal albumin 3.6 mag 2.3 tsh 0.971 vit B 12: 971; vit D 41.0; hgb a1c 6.3  02-01-18: chol 116; ldl 58; trig 38; hdl 50    NO NEW LABS.    Review of Systems  Unable to perform ROS: Dementia (unable to participate )   Physical Exam  Constitutional: She  appears well-developed and well-nourished. No distress.  Neck: No thyromegaly present.  Cardiovascular: Normal rate, regular rhythm, normal heart sounds and intact distal pulses.  Pulmonary/Chest: Effort normal and breath sounds normal. No respiratory distress.  Abdominal: Soft. Bowel sounds are normal. She exhibits no distension. There is no tenderness.  Musculoskeletal: She exhibits no edema.  Able to move all extremities Uses wheelchair  Lymphadenopathy:    She has no cervical adenopathy.  Neurological: She is alert.  Skin: Skin is warm and dry. She is not diaphoretic.  Psychiatric: She has a normal mood and affect.    ASSESSMENT/ PLAN:  TODAY:   1. Hypokalemia: stable k+ 3.7 will conitnue k+ 10 meq daily   2. Chronic systolic heart failure: stable EF 45-50% (05-02-16): will continue coreg 6.25 mg twice daily   3. Hypertensive heart disease with heart failure: stable 116/68: will continue lotrel 10/20 mg daily coreg 6.25 mg twice daily; hctz 25 mg daily    4.  Paroxysmal atrial fibrillation: heart rate is stable: status post pacemaker insertion will continue eliquis 2.5 mg twice daily is taking coreg 6.25 mg twice daily for rate control   PREVIOUS   5. Type 2 diabetes mellitus with polyneuropathy: is stable hgb a1c 6.3; will continue to monitor her status is on ace; statin and eliquis  6. Cerebral infarction due to embolism of middle cerebral artery: is stable will continue eliquis 2.5 mg twice daily  7.  Dyslipidemia associated with type 2 diabetes mellitus: is stable  LDL 58; lipitor has been stopped   8.  Vascular dementia without behavioral disturbance: is without change weight is 120 pounds will monitor  9. Residual stage of open-angle glaucoma bilateral: is stable will continue lumigan to both eyes nightly    MD is aware of resident's narcotic use and is in agreement with current plan of care. We will attempt to wean resident as apropriate   Synthia Innocent  NP [facility] Adult Medicine  Contact [phone] Monday through Friday 8am- 5pm  After hours call [phone]

## 2018-04-25 DIAGNOSIS — Z950 Presence of cardiac pacemaker: Secondary | ICD-10-CM | POA: Diagnosis not present

## 2018-04-25 DIAGNOSIS — I459 Conduction disorder, unspecified: Secondary | ICD-10-CM | POA: Diagnosis not present

## 2018-04-25 DIAGNOSIS — Z45018 Encounter for adjustment and management of other part of cardiac pacemaker: Secondary | ICD-10-CM | POA: Diagnosis not present

## 2018-05-01 DIAGNOSIS — E441 Mild protein-calorie malnutrition: Secondary | ICD-10-CM | POA: Diagnosis not present

## 2018-05-01 DIAGNOSIS — I77810 Thoracic aortic ectasia: Secondary | ICD-10-CM | POA: Diagnosis not present

## 2018-05-01 DIAGNOSIS — E118 Type 2 diabetes mellitus with unspecified complications: Secondary | ICD-10-CM | POA: Diagnosis not present

## 2018-05-01 DIAGNOSIS — I480 Paroxysmal atrial fibrillation: Secondary | ICD-10-CM | POA: Diagnosis not present

## 2018-05-01 DIAGNOSIS — I495 Sick sinus syndrome: Secondary | ICD-10-CM | POA: Diagnosis not present

## 2018-05-01 DIAGNOSIS — I5022 Chronic systolic (congestive) heart failure: Secondary | ICD-10-CM | POA: Diagnosis not present

## 2018-05-01 DIAGNOSIS — I472 Ventricular tachycardia: Secondary | ICD-10-CM | POA: Diagnosis not present

## 2018-05-01 DIAGNOSIS — Z789 Other specified health status: Secondary | ICD-10-CM | POA: Diagnosis not present

## 2018-05-06 ENCOUNTER — Encounter
Admission: RE | Admit: 2018-05-06 | Discharge: 2018-05-06 | Disposition: A | Discharge status: Home / Self Care | Payer: Medicare HMO | Source: Ambulatory Visit | Attending: Internal Medicine | Admitting: Internal Medicine

## 2018-05-08 ENCOUNTER — Non-Acute Institutional Stay (SKILLED_NURSING_FACILITY): Payer: Medicare HMO | Admitting: Adult Health

## 2018-05-08 ENCOUNTER — Encounter: Payer: Self-pay | Admitting: Adult Health

## 2018-05-08 DIAGNOSIS — E1142 Type 2 diabetes mellitus with diabetic polyneuropathy: Secondary | ICD-10-CM | POA: Diagnosis not present

## 2018-05-08 DIAGNOSIS — E1169 Type 2 diabetes mellitus with other specified complication: Secondary | ICD-10-CM

## 2018-05-08 DIAGNOSIS — E785 Hyperlipidemia, unspecified: Secondary | ICD-10-CM | POA: Diagnosis not present

## 2018-05-08 DIAGNOSIS — I63419 Cerebral infarction due to embolism of unspecified middle cerebral artery: Secondary | ICD-10-CM | POA: Diagnosis not present

## 2018-05-08 DIAGNOSIS — F0150 Vascular dementia without behavioral disturbance: Secondary | ICD-10-CM

## 2018-05-08 NOTE — Progress Notes (Signed)
Location:   [facility] Room Number: 319 Place of Service:  SNF (31)   CODE STATUS: dnr  No Known Allergies  Chief Complaint  Patient presents with  . Medical Management of Chronic Issues    Dyslipidemia associated with type 2 diabetes mellitus; cerebral infarction due to embolism of middle cerebral artery unspecified blood vessel laterality; vascular dementia without behavioral disturbance; controlled type 2 diabetes mellitus with diabetic polyneuropathy without long term current use of insulin      HPI:  She is a 84 year old long term resident of this facility being seen for the management of her chronic illnesses: dyslipidemia; cva; dementia; diabetes. She is unable to participate in the hpi or ros. There are no reports of uncontrolled pain; no changes in appetite; no indications of agitation or anxiety.   Past Medical History:  Diagnosis Date  . Aortic valve sclerosis   . Bradycardia   . Cardiac pacemaker in situ    St. Jude single chamber pacemaker model 1210 SN# [id number] with St Jude 1888tc RV SN# [id number] both implanted on 04/26/2011  . CHF (congestive heart failure) (HCC)   . CVA (cerebral vascular accident) (HCC)    Right sided weakness  . Diabetes mellitus without complication (HCC)   . Dyspnea, unspecified   . Heart block    intermittent 3rd degree heart block with ventricular escape rate of approximatley 38bpm  . HFrEF (heart failure with reduced ejection fraction) (HCC)   . History of chicken pox   . History of echocardiogram 03/07/2011   Normal LV size/function. Moderate aortic root dilation, Moderate MR. Severe TR. PASP .  Marland Kitchen Hyperlipidemia, unspecified   . Hypertension   . Osteoarthritis   . Pacemaker   . Paroxysmal atrial fibrillation (HCC)    Documented in August 2016 at the time of CVA  . Stroke [facility]) 2005    Past Surgical History:  Procedure Laterality Date  . ABDOMINAL HYSTERECTOMY    . INSERT REPLACE REMOVE PACEMAKER    . JOINT  REPLACEMENT Bilateral    hips  . LUMBAR FUSION  1996  . PACEMAKER PLACEMENT Left    DUAL CHAMBER PACEMAKER GENERATOR  . TOTAL KNEE ARTHROPLASTY Right 08/10/2012   Procedure: RIGHT TOTAL KNEE ARTHROPLASTY; Surgeon: Christell Faith, MD; Location: [facility] OR; Service: Orthopedics; Laterality: Right;    Social History   Socioeconomic History  . Marital status: Widowed    Spouse name: Not on file  . Number of children: 4  . Years of education: 68  . Highest education level: Not on file  Occupational History  . 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 Financial resource strain: Not on file  . Food insecurity:    Worry: Not on file    Inability: Not on file  . Transportation needs:    Medical: Not on file    Non-medical: Not on file  Tobacco Use  . Smoking status: Former Games developer  . Smokeless tobacco: Never Used  Substance and Sexual Activity  . Alcohol use: No  . Drug use: No  . Sexual activity: Not on file  Lifestyle  . Physical activity:    Days per week: Not on file    Minutes per session: Not on file  . Stress: Not on file  Relationships  . Social connections:    Talks on phone: Not on file    Gets together: Not on file    Attends religious service: Not  on file    Active member of club or organization: Not on file    Attends meetings of clubs or organizations: Not on file    Relationship status: Not on file  . Intimate partner violence:    Fear of current or ex partner: Not on file    Emotionally abused: Not on file    Physically abused: Not on file    Forced sexual activity: Not on file  Other Topics Concern  . Not on file  Social History Narrative   [facility]   Widowed   4 children   Former Smoker   No Drinks   DNR   Family History  Problem Relation Age of Onset  . Cervical cancer Mother   . Stroke Sister   . Diabetes type II Brother       VITAL SIGNS BP 138/62    Pulse 70   Temp 98.2 F (36.8 C)   Resp 18   Ht 5\' 2"  (1.575 m)   Wt 121 lb 6.4 oz (55.1 kg)   SpO2 98%   BMI 22.20 kg/m   Outpatient Encounter Medications as of 05/08/2018  Medication Sig  . acetaminophen (TYLENOL) 500 MG tablet Take 500 mg by mouth every 4 (four) hours as needed for mild pain or fever.  Marland Kitchen. amLODipine-benazepril (LOTREL) 10-20 MG capsule Take 1 capsule by mouth daily.   Marland Kitchen. apixaban (ELIQUIS) 2.5 MG TABS tablet Take 1 tablet (2.5 mg total) by mouth 2 (two) times daily.  . bimatoprost (LUMIGAN) 0.01 % SOLN Place 1 drop into both eyes at bedtime.   . carvedilol (COREG) 6.25 MG tablet Take 1 tablet (6.25 mg total) by mouth 2 (two) times daily with a meal.  . hydrochlorothiazide (HYDRODIURIL) 25 MG tablet Take 25 mg by mouth daily.   . Infant Care Products [facility](DERMACLOUD EX) Apply liberal amount topically to area of skin irritation prn. OK to leave at bedside  . magnesium hydroxide (MILK OF MAGNESIA) 400 MG/5ML suspension Take 30 mLs by mouth every 4 (four) hours as needed for mild constipation. For constipation/ no BM for 2 days  . Multiple Vitamin (MULTIVITAMIN WITH MINERALS) TABS tablet Take 1 tablet by mouth daily.  . potassium chloride (K-DUR,KLOR-CON) 10 MEQ tablet Take 10 mEq by mouth daily.  . Skin Protectants, Misc. (NO-STING SKIN-PREP EX) Apply 1 application to both heels topically two times daily  . UNABLE TO FIND Diet: NCS, NAS   No facility-administered encounter medications on file as of 05/08/2018.      SIGNIFICANT DIAGNOSTIC EXAMS   LABS REVIEWED: PREVIOUS:   12-06-17: wbc 2.0; hgb 13.3; hct 40.4; mcv 88.1 plt 179; glucose 86; bun 27; creat 0.87; k+ 3.7; na++ 143; ca 9.2; liver normal albumin 3.6 mag 2.3 tsh 0.971 vit B 12: 971; vit D 41.0; hgb a1c 6.3  02-01-18: chol 116; ldl 58; trig 38; hdl 50    NO NEW LABS.   Review of Systems  Unable to perform ROS: Dementia (unable to participate )   Physical Exam  Constitutional: She appears well-developed and  well-nourished. No distress.  thin  Neck: No thyromegaly present.  Cardiovascular: Normal rate, regular rhythm, normal heart sounds and intact distal pulses.  Pulmonary/Chest: Effort normal and breath sounds normal. No respiratory distress.  Abdominal: Soft. Bowel sounds are normal. She exhibits no distension. There is no tenderness.  Musculoskeletal: She exhibits no edema.  Is able to move all extremities Uses wheelchair   Lymphadenopathy:  She has no cervical adenopathy.  Neurological: She is alert.  Skin: Skin is warm and dry. She is not diaphoretic.  Psychiatric: She has a normal mood and affect.    ASSESSMENT/ PLAN:  TODAY:   1. Controlled type 2 diabetes mellitus with diabetic polyneuropathy without long term current use of insulin is stable hgb a1c 6.3; will continue to monitor her status is on ace; and eliquis her statin was stopped   2. Cerebral infarction due to embolism of middle cerebral artery: is stable will continue eliquis 2.5 mg twice daily  3.  Dyslipidemia associated with type 2 diabetes mellitus: is stable  LDL 58; lipitor has been stopped   4.  Vascular dementia without behavioral disturbance: is without change weight is 121 (previus 120) pounds will monitor  PREVIOUS   5. Residual stage of open-angle glaucoma bilateral: is stable will continue lumigan to both eyes nightly   6. Hypokalemia: stable k+ 3.7 will conitnue k+ 10 meq daily   7. Chronic systolic heart failure: stable EF 45-50% (05-02-16): will continue coreg 6.25 mg twice daily   8. Hypertensive heart disease with heart failure: stable 116/68: will continue lotrel 10/20 mg daily coreg 6.25 mg twice daily; hctz 25 mg daily    9.  Paroxysmal atrial fibrillation: heart rate is stable: status post pacemaker insertion will continue eliquis 2.5 mg twice daily is taking coreg 6.25 mg twice daily for rate control       MD is aware of resident's narcotic use and is in agreement with current plan of  care. We will attempt to wean resident as apropriate   Synthia Innocent NP [facility]  Contact [phone] Monday through Friday 8am- 5pm  After hours call [phone]

## 2018-05-10 DIAGNOSIS — E1149 Type 2 diabetes mellitus with other diabetic neurological complication: Secondary | ICD-10-CM | POA: Insufficient documentation

## 2018-05-16 ENCOUNTER — Encounter: Payer: Self-pay | Admitting: Adult Health

## 2018-05-16 ENCOUNTER — Non-Acute Institutional Stay (SKILLED_NURSING_FACILITY): Payer: Medicare HMO | Admitting: Adult Health

## 2018-05-16 DIAGNOSIS — F0150 Vascular dementia without behavioral disturbance: Secondary | ICD-10-CM

## 2018-05-16 DIAGNOSIS — I110 Hypertensive heart disease with heart failure: Secondary | ICD-10-CM | POA: Diagnosis not present

## 2018-05-16 DIAGNOSIS — I63419 Cerebral infarction due to embolism of unspecified middle cerebral artery: Secondary | ICD-10-CM | POA: Diagnosis not present

## 2018-05-16 NOTE — Progress Notes (Signed)
Location:   [facility] Room Number: 319 B Place of Service:  SNF (31)   CODE STATUS: DNR  No Known Allergies  Chief Complaint  Patient presents with  . Acute Visit    Care Plan Meeting    HPI:  We have come together for her routine care plan meeting. Her daughter is present. She has gained weight from July 2019: 115 pounds to 05-16-18: 121 pounds. There are no reports of uncontrolled pain; her family reports that her cognition is worse. She has a good appetite; there are no reports of insomnia present. We have reviewed her medications and care plan. She continues to be followed for her chronic illnesses including: diabetes; cva; and dementia.    Past Medical History:  Diagnosis Date  . Aortic valve sclerosis   . Bradycardia   . Cardiac pacemaker in situ    St. Jude single chamber pacemaker model 1210 SN# [id number] with St Jude 1888tc RV SN# [id number] both implanted on 04/26/2011  . CHF (congestive heart failure) (HCC)   . CVA (cerebral vascular accident) (HCC)    Right sided weakness  . Diabetes mellitus without complication (HCC)   . Dyspnea, unspecified   . Heart block    intermittent 3rd degree heart block with ventricular escape rate of approximatley 38bpm  . HFrEF (heart failure with reduced ejection fraction) (HCC)   . History of chicken pox   . History of echocardiogram 03/07/2011   Normal LV size/function. Moderate aortic root dilation, Moderate MR. Severe TR. PASP .  Marland Kitchen Hyperlipidemia, unspecified   . Hypertension   . Osteoarthritis   . Pacemaker   . Paroxysmal atrial fibrillation (HCC)    Documented in August 2016 at the time of CVA  . Stroke [facility]) 2005    Past Surgical History:  Procedure Laterality Date  . ABDOMINAL HYSTERECTOMY    . INSERT REPLACE REMOVE PACEMAKER    . JOINT REPLACEMENT Bilateral    hips  . LUMBAR FUSION  1996  . PACEMAKER PLACEMENT Left    DUAL CHAMBER PACEMAKER GENERATOR  . TOTAL KNEE ARTHROPLASTY  Right 08/10/2012   Procedure: RIGHT TOTAL KNEE ARTHROPLASTY; Surgeon: Christell Faith, MD; Location: [facility] OR; Service: Orthopedics; Laterality: Right;    Social History   Socioeconomic History  . Marital status: Widowed    Spouse name: Not on file  . Number of children: 4  . Years of education: 46  . Highest education level: Not on file  Occupational History  . 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 Financial resource strain: Not on file  . Food insecurity:    Worry: Not on file    Inability: Not on file  . Transportation needs:    Medical: Not on file    Non-medical: Not on file  Tobacco Use  . Smoking status: Former Games developer  . Smokeless tobacco: Never Used  Substance and Sexual Activity  . Alcohol use: No  . Drug use: No  . Sexual activity: Not on file  Lifestyle  . Physical activity:    Days per week: Not on file    Minutes per session: Not on file  . Stress: Not on file  Relationships  . Social connections:    Talks on phone: Not on file    Gets together: Not on file    Attends religious service: Not on file    Active member of club or organization: Not on file  Attends meetings of clubs or organizations: Not on file    Relationship status: Not on file  . Intimate partner violence:    Fear of current or ex partner: Not on file    Emotionally abused: Not on file    Physically abused: Not on file    Forced sexual activity: Not on file  Other Topics Concern  . Not on file  Social History Narrative   [facility]   Widowed   4 children   Former Smoker   No Drinks   DNR   Family History  Problem Relation Age of Onset  . Cervical cancer Mother   . Stroke Sister   . Diabetes type II Brother       VITAL SIGNS BP 126/83   Pulse 72   Temp 97.8 F (36.6 C)   Resp 18   Ht 5\' 2"  (1.575 m)   Wt 121 lb 6.4 oz (55.1 kg)   SpO2 97%   BMI 22.20 kg/m   Outpatient  Encounter Medications as of 05/16/2018  Medication Sig  . acetaminophen (TYLENOL) 500 MG tablet Take 500 mg by mouth every 4 (four) hours as needed for mild pain or fever.  Marland Kitchen amLODipine-benazepril (LOTREL) 10-20 MG capsule Take 1 capsule by mouth daily.   Marland Kitchen apixaban (ELIQUIS) 2.5 MG TABS tablet Take 1 tablet (2.5 mg total) by mouth 2 (two) times daily.  . bimatoprost (LUMIGAN) 0.01 % SOLN Place 1 drop into both eyes at bedtime.   . carvedilol (COREG) 6.25 MG tablet Take 1 tablet (6.25 mg total) by mouth 2 (two) times daily with a meal.  . hydrochlorothiazide (HYDRODIURIL) 25 MG tablet Take 25 mg by mouth daily.   . Infant Care Products [facility] EX) Apply liberal amount topically to area of skin irritation prn. OK to leave at bedside  . magnesium hydroxide (MILK OF MAGNESIA) 400 MG/5ML suspension Take 30 mLs by mouth every 4 (four) hours as needed for mild constipation. For constipation/ no BM for 2 days  . Multiple Vitamin (MULTIVITAMIN WITH MINERALS) TABS tablet Take 1 tablet by mouth daily.  . potassium chloride (K-DUR,KLOR-CON) 10 MEQ tablet Take 10 mEq by mouth daily.  . Skin Protectants, Misc. (NO-STING SKIN-PREP EX) Apply 1 application to both heels topically two times daily  . UNABLE TO FIND Diet: NCS, NAS  . Wound Dressings (ALLEVYN ADHESIVE EX) Apply topically. Apply to right great toe joint / bunion every week and as needed for protection   No facility-administered encounter medications on file as of 05/16/2018.      SIGNIFICANT DIAGNOSTIC EXAMS  LABS REVIEWED: PREVIOUS:   12-06-17: wbc 2.0; hgb 13.3; hct 40.4; mcv 88.1 plt 179; glucose 86; bun 27; creat 0.87; k+ 3.7; na++ 143; ca 9.2; liver normal albumin 3.6 mag 2.3 tsh 0.971 vit B 12: 971; vit D 41.0; hgb a1c 6.3  02-01-18: chol 116; ldl 58; trig 38; hdl 50    NO NEW LABS.   Review of Systems  Unable to perform ROS: Dementia (unable to participate )   Physical Exam Constitutional:      General: She is not in acute  distress.    Appearance: Normal appearance. She is well-developed. She is not diaphoretic.     Comments: Thin   Neck:     Musculoskeletal: Normal range of motion and neck supple. No neck rigidity.     Thyroid: No thyromegaly.  Cardiovascular:     Rate and Rhythm: Normal rate and  regular rhythm.     Heart sounds: Normal heart sounds.  Pulmonary:     Effort: Pulmonary effort is normal. No respiratory distress.     Breath sounds: Normal breath sounds.  Abdominal:     General: Bowel sounds are normal. There is no distension.     Palpations: Abdomen is soft.     Tenderness: There is no abdominal tenderness.  Musculoskeletal:     Right lower leg: No edema.     Left lower leg: No edema.     Comments: Is able to move all extremities Uses wheelchair    Lymphadenopathy:     Cervical: No cervical adenopathy.  Skin:    General: Skin is warm and dry.  Neurological:     Mental Status: She is alert. Mental status is at baseline.  Psychiatric:        Mood and Affect: Mood normal.     ASSESSMENT/ PLAN:  TODAY:   1.  Cerebral infarction due to embolism of middle cerebral artery:  2.  Vascular dementia without behavioral disturbance: 3. Hypertensive heart disease with heart failure:   Will continue her current plan of care Will not make medication changes Will continue to monitor her status.    MD is aware of resident's narcotic use and is in agreement with current plan of care. We will attempt to wean resident as apropriate   Synthia Innocenteborah Jamez Ambrocio NP [facility]  Contact [phone] Monday through Friday 8am- 5pm  After hours call [phone]

## 2018-06-08 ENCOUNTER — Non-Acute Institutional Stay (SKILLED_NURSING_FACILITY): Payer: Medicare HMO | Admitting: Adult Health

## 2018-06-08 ENCOUNTER — Encounter: Payer: Self-pay | Admitting: Adult Health

## 2018-06-08 DIAGNOSIS — E876 Hypokalemia: Secondary | ICD-10-CM | POA: Diagnosis not present

## 2018-06-08 DIAGNOSIS — H40153 Residual stage of open-angle glaucoma, bilateral: Secondary | ICD-10-CM

## 2018-06-08 DIAGNOSIS — I5022 Chronic systolic (congestive) heart failure: Secondary | ICD-10-CM | POA: Diagnosis not present

## 2018-06-08 DIAGNOSIS — I110 Hypertensive heart disease with heart failure: Secondary | ICD-10-CM

## 2018-06-08 DIAGNOSIS — Z950 Presence of cardiac pacemaker: Secondary | ICD-10-CM

## 2018-06-08 NOTE — Progress Notes (Signed)
[facility]   [facility] Room Number: 319 B Place of Service:  SNF (31)   CODE STATUS: DNR  No Known Allergies  Chief Complaint  Patient presents with  . Medical Management of Chronic Issues    Hypertensive heart disease with heart failure; chronic systolic (congestive) heart failure; hypokalemia; residual stage of open angle glaucoma bilateral.     HPI:  She is a 85 year old long term resident of this facility being seen for the management of her chronic illnesses; hypertensive heart disease; heart failure; hypokalemia; glaucoma. She is unable to fully participate in the hpi or ros. There are no reports of significant weight change; no changes in appetite; on reports of uncontrolled pain.   Past Medical History:  Diagnosis Date  . Aortic valve sclerosis   . Bradycardia   . Cardiac pacemaker in situ    St. Jude single chamber pacemaker model 1210 SN# [id number] with St Jude 1888tc RV SN# [id number] both implanted on 04/26/2011  . CHF (congestive heart failure) (HCC)   . CVA (cerebral vascular accident) (HCC)    Right sided weakness  . Diabetes mellitus without complication (HCC)   . Dyspnea, unspecified   . Heart block    intermittent 3rd degree heart block with ventricular escape rate of approximatley 38bpm  . HFrEF (heart failure with reduced ejection fraction) (HCC)   . History of chicken pox   . History of echocardiogram 03/07/2011   Normal LV size/function. Moderate aortic root dilation, Moderate MR. Severe TR. PASP .  Marland Kitchen Hyperlipidemia, unspecified   . Hypertension   . Osteoarthritis   . Pacemaker   . Paroxysmal atrial fibrillation (HCC)    Documented in August 2016 at the time of CVA  . Stroke [facility]) 2005    Past Surgical History:  Procedure Laterality Date  . ABDOMINAL HYSTERECTOMY    . INSERT REPLACE REMOVE PACEMAKER    . JOINT REPLACEMENT Bilateral    hips  . LUMBAR FUSION  1996  . PACEMAKER PLACEMENT Left    DUAL CHAMBER  PACEMAKER GENERATOR  . TOTAL KNEE ARTHROPLASTY Right 08/10/2012   Procedure: RIGHT TOTAL KNEE ARTHROPLASTY; Surgeon: Christell Faith, MD; Location: [facility] OR; Service: Orthopedics; Laterality: Right;    Social History   Socioeconomic History  . Marital status: Widowed    Spouse name: Not on file  . Number of children: 4  . Years of education: 35  . Highest education level: Not on file  Occupational History  . 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 Financial resource strain: Not on file  . Food insecurity:    Worry: Not on file    Inability: Not on file  . Transportation needs:    Medical: Not on file    Non-medical: Not on file  Tobacco Use  . Smoking status: Former Games developer  . Smokeless tobacco: Never Used  Substance and Sexual Activity  . Alcohol use: No  . Drug use: No  . Sexual activity: Not on file  Lifestyle  . Physical activity:    Days per week: Not on file    Minutes per session: Not on file  . Stress: Not on file  Relationships  . Social connections:    Talks on phone: Not on file    Gets together: Not on file    Attends religious service: Not on file    Active member of club or organization: Not on file  Attends meetings of clubs or organizations: Not on file    Relationship status: Not on file  . Intimate partner violence:    Fear of current or ex partner: Not on file    Emotionally abused: Not on file    Physically abused: Not on file    Forced sexual activity: Not on file  Other Topics Concern  . Not on file  Social History Narrative   Admit date to [facility]   Widowed   4 children   Former Smoker   No Drinks   DNR   Family History  Problem Relation Age of Onset  . Cervical cancer Mother   . Stroke Sister   . Diabetes type II Brother       VITAL SIGNS BP 126/74   Pulse 73   Temp (!) 97 F (36.1 C)   Resp (!) 22   Ht 5\' 2"  (1.575 m)   Wt 121 lb 6.4 oz (55.1  kg)   SpO2 95%   BMI 22.20 kg/m   Outpatient Encounter Medications as of 06/08/2018  Medication Sig  . acetaminophen (TYLENOL) 500 MG tablet Take 500 mg by mouth every 4 (four) hours as needed for mild pain or fever.  Marland Kitchen amLODipine-benazepril (LOTREL) 10-20 MG capsule Take 1 capsule by mouth daily.   Marland Kitchen apixaban (ELIQUIS) 2.5 MG TABS tablet Take 1 tablet (2.5 mg total) by mouth 2 (two) times daily.  . bimatoprost (LUMIGAN) 0.01 % SOLN Place 1 drop into both eyes at bedtime.   . carvedilol (COREG) 6.25 MG tablet Take 1 tablet (6.25 mg total) by mouth 2 (two) times daily with a meal.  . hydrochlorothiazide (HYDRODIURIL) 25 MG tablet Take 25 mg by mouth daily.   . Infant Care Products [facility] EX) Apply liberal amount topically to area of skin irritation prn. OK to leave at bedside  . magnesium hydroxide (MILK OF MAGNESIA) 400 MG/5ML suspension Take 30 mLs by mouth every 4 (four) hours as needed for mild constipation. For constipation/ no BM for 2 days  . Multiple Vitamin (MULTIVITAMIN WITH MINERALS) TABS tablet Take 1 tablet by mouth daily.  . potassium chloride (K-DUR,KLOR-CON) 10 MEQ tablet Take 10 mEq by mouth daily.  . Skin Protectants, Misc. (NO-STING SKIN-PREP EX) Apply 1 application to both heels topically two times daily  . UNABLE TO FIND Diet: NCS, NAS  . Wound Dressings (ALLEVYN ADHESIVE EX) Apply topically. Apply to right great toe joint / bunion every week and as needed for protection   No facility-administered encounter medications on file as of 06/08/2018.      SIGNIFICANT DIAGNOSTIC EXAMS  LABS REVIEWED: PREVIOUS:   12-06-17: wbc 2.0; hgb 13.3; hct 40.4; mcv 88.1 plt 179; glucose 86; bun 27; creat 0.87; k+ 3.7; na++ 143; ca 9.2; liver normal albumin 3.6 mag 2.3 tsh 0.971 vit B 12: 971; vit D 41.0; hgb a1c 6.3  02-01-18: chol 116; ldl 58; trig 38; hdl 50    NO NEW LABS.    Review of Systems  Unable to perform ROS: Dementia (unable to participate)    Physical  Exam Constitutional:      General: She is not in acute distress.    Appearance: She is well-developed. She is not diaphoretic.     Comments: Thin   Neck:     Thyroid: No thyromegaly.  Cardiovascular:     Rate and Rhythm: Normal rate and regular rhythm.     Pulses: Normal pulses.     Heart  sounds: Normal heart sounds.     Comments: Has pacemaker  Pulmonary:     Effort: Pulmonary effort is normal. No respiratory distress.     Breath sounds: Normal breath sounds.  Abdominal:     General: Bowel sounds are normal. There is no distension.     Palpations: Abdomen is soft.     Tenderness: There is no abdominal tenderness.  Musculoskeletal:     Right lower leg: No edema.     Left lower leg: No edema.     Comments:  Is able to move all extremities Uses wheelchair    Lymphadenopathy:     Cervical: No cervical adenopathy.  Skin:    General: Skin is warm and dry.  Neurological:     Mental Status: She is alert. Mental status is at baseline.  Psychiatric:        Mood and Affect: Mood normal.      ASSESSMENT/ PLAN:  TODAY:   1. Residual stage of open-angle glaucoma bilateral: is stable will continue lumigan to both eyes nightly   2. Hypokalemia: stable k+ 3.7 will conitnue k+ 10 meq daily   3. Chronic systolic heart failure: stable EF 45-50% (05-02-16): will continue coreg 6.25 mg twice daily   4. Hypertensive heart disease with heart failure: stable 126/74: will continue lotrel 10/20 mg daily coreg 6.25 mg twice daily; hctz 25 mg daily    PREVIOUS   5.  Paroxysmal atrial fibrillation: heart rate is stable: status post pacemaker insertion will continue eliquis 2.5 mg twice daily is taking coreg 6.25 mg twice daily for rate control   6. Controlled type 2 diabetes mellitus with diabetic polyneuropathy without long term current use of insulin is stable hgb a1c 6.3; will continue to monitor her status is on ace; and eliquis her statin was stopped   7. Cerebral infarction due to  embolism of middle cerebral artery: is stable will continue eliquis 2.5 mg twice daily  8.  Dyslipidemia associated with type 2 diabetes mellitus: is stable  LDL 58; lipitor has been stopped   9.  Vascular dementia without behavioral disturbance: is without change weight is 121 (previus 120) pounds will monitor  Will check cbc; cmp hgb a1c   MD is aware of resident's narcotic use and is in agreement with current plan of care. We will attempt to wean resident as apropriate   Synthia Innocenteborah Niko Penson NP [facility]  Contact [phone] Monday through Friday 8am- 5pm  After hours call [phone]

## 2018-06-11 ENCOUNTER — Other Ambulatory Visit
Admission: RE | Admit: 2018-06-11 | Discharge: 2018-06-11 | Disposition: A | Discharge status: Home / Self Care | Payer: Medicare HMO | Source: Skilled Nursing Facility | Attending: Adult Health | Admitting: Adult Health

## 2018-06-11 DIAGNOSIS — I509 Heart failure, unspecified: Secondary | ICD-10-CM | POA: Insufficient documentation

## 2018-06-11 DIAGNOSIS — Z79899 Other long term (current) drug therapy: Secondary | ICD-10-CM | POA: Insufficient documentation

## 2018-06-11 DIAGNOSIS — I110 Hypertensive heart disease with heart failure: Secondary | ICD-10-CM | POA: Diagnosis not present

## 2018-06-11 LAB — CBC WITH DIFFERENTIAL/PLATELET
Abs Immature Granulocytes: 0 10*3/uL (ref 0.00–0.07)
BASOS PCT: 1 %
Basophils Absolute: 0 10*3/uL (ref 0.0–0.1)
Eosinophils Absolute: 0.1 10*3/uL (ref 0.0–0.5)
Eosinophils Relative: 4 %
HEMATOCRIT: 40.6 % (ref 36.0–46.0)
Hemoglobin: 12.9 g/dL (ref 12.0–15.0)
Immature Granulocytes: 0 %
Lymphocytes Relative: 44 %
Lymphs Abs: 1 10*3/uL (ref 0.7–4.0)
MCH: 28.2 pg (ref 26.0–34.0)
MCHC: 31.8 g/dL (ref 30.0–36.0)
MCV: 88.8 fL (ref 80.0–100.0)
Monocytes Absolute: 0.3 10*3/uL (ref 0.1–1.0)
Monocytes Relative: 13 %
Neutro Abs: 0.8 10*3/uL — ABNORMAL LOW (ref 1.7–7.7)
Neutrophils Relative %: 38 %
Platelets: 213 10*3/uL (ref 150–400)
RBC: 4.57 MIL/uL (ref 3.87–5.11)
RDW: 14.9 % (ref 11.5–15.5)
Smear Review: NORMAL
WBC: 2.2 10*3/uL — ABNORMAL LOW (ref 4.0–10.5)
nRBC: 0 % (ref 0.0–0.2)

## 2018-06-11 LAB — COMPREHENSIVE METABOLIC PANEL
ALT: 16 U/L (ref 0–44)
AST: 23 U/L (ref 15–41)
Albumin: 3.3 g/dL — ABNORMAL LOW (ref 3.5–5.0)
Alkaline Phosphatase: 74 U/L (ref 38–126)
Anion gap: 9 (ref 5–15)
BUN: 34 mg/dL — AB (ref 8–23)
CO2: 21 mmol/L — ABNORMAL LOW (ref 22–32)
CREATININE: 0.88 mg/dL (ref 0.44–1.00)
Calcium: 9.2 mg/dL (ref 8.9–10.3)
Chloride: 111 mmol/L (ref 98–111)
GFR calc Af Amer: >60 mL/min (ref >60)
GFR calc non Af Amer: 60 mL/min — ABNORMAL LOW (ref >60)
GLUCOSE: 76 mg/dL (ref 70–99)
Potassium: 3.6 mmol/L (ref 3.5–5.1)
Sodium: 141 mmol/L (ref 135–145)
Total Bilirubin: 0.6 mg/dL (ref 0.3–1.2)
Total Protein: 6.4 g/dL — ABNORMAL LOW (ref 6.5–8.1)

## 2018-06-11 LAB — HEMOGLOBIN A1C
Hgb A1c MFr Bld: 5.9 % — ABNORMAL HIGH (ref 4.8–5.6)
Mean Plasma Glucose: 122.63 mg/dL

## 2018-07-02 DIAGNOSIS — I110 Hypertensive heart disease with heart failure: Secondary | ICD-10-CM | POA: Diagnosis not present

## 2018-07-02 DIAGNOSIS — I5022 Chronic systolic (congestive) heart failure: Secondary | ICD-10-CM | POA: Diagnosis not present

## 2018-07-02 DIAGNOSIS — R404 Transient alteration of awareness: Secondary | ICD-10-CM | POA: Diagnosis not present

## 2018-07-02 DIAGNOSIS — M6281 Muscle weakness (generalized): Secondary | ICD-10-CM | POA: Diagnosis not present

## 2018-07-02 DIAGNOSIS — E1142 Type 2 diabetes mellitus with diabetic polyneuropathy: Secondary | ICD-10-CM | POA: Diagnosis not present

## 2018-07-02 DIAGNOSIS — F0150 Vascular dementia without behavioral disturbance: Secondary | ICD-10-CM | POA: Diagnosis not present

## 2018-07-03 ENCOUNTER — Other Ambulatory Visit: Payer: Self-pay

## 2018-07-03 DIAGNOSIS — I5022 Chronic systolic (congestive) heart failure: Secondary | ICD-10-CM | POA: Diagnosis not present

## 2018-07-03 DIAGNOSIS — E1142 Type 2 diabetes mellitus with diabetic polyneuropathy: Secondary | ICD-10-CM | POA: Diagnosis not present

## 2018-07-03 DIAGNOSIS — I110 Hypertensive heart disease with heart failure: Secondary | ICD-10-CM | POA: Diagnosis not present

## 2018-07-03 DIAGNOSIS — M6281 Muscle weakness (generalized): Secondary | ICD-10-CM | POA: Diagnosis not present

## 2018-07-03 DIAGNOSIS — F0150 Vascular dementia without behavioral disturbance: Secondary | ICD-10-CM | POA: Diagnosis not present

## 2018-07-03 DIAGNOSIS — R404 Transient alteration of awareness: Secondary | ICD-10-CM | POA: Diagnosis not present

## 2018-07-03 MED ORDER — CARVEDILOL 6.25 MG PO TABS: 6.2500 mg | ORAL_TABLET | Freq: Two times a day (BID) | ORAL | 12 refills | Status: AC

## 2018-07-04 DIAGNOSIS — I110 Hypertensive heart disease with heart failure: Secondary | ICD-10-CM | POA: Diagnosis not present

## 2018-07-04 DIAGNOSIS — I5022 Chronic systolic (congestive) heart failure: Secondary | ICD-10-CM | POA: Diagnosis not present

## 2018-07-04 DIAGNOSIS — R404 Transient alteration of awareness: Secondary | ICD-10-CM | POA: Diagnosis not present

## 2018-07-04 DIAGNOSIS — E1142 Type 2 diabetes mellitus with diabetic polyneuropathy: Secondary | ICD-10-CM | POA: Diagnosis not present

## 2018-07-04 DIAGNOSIS — M6281 Muscle weakness (generalized): Secondary | ICD-10-CM | POA: Diagnosis not present

## 2018-07-04 DIAGNOSIS — F0150 Vascular dementia without behavioral disturbance: Secondary | ICD-10-CM | POA: Diagnosis not present

## 2018-07-07 ENCOUNTER — Encounter
Admission: RE | Admit: 2018-07-07 | Discharge: 2018-07-07 | Disposition: A | Discharge status: Home / Self Care | Payer: Medicare HMO | Source: Ambulatory Visit | Attending: Internal Medicine | Admitting: Internal Medicine

## 2018-07-09 ENCOUNTER — Encounter: Payer: Self-pay | Admitting: Adult Health

## 2018-07-09 ENCOUNTER — Non-Acute Institutional Stay (SKILLED_NURSING_FACILITY): Payer: Medicare HMO | Admitting: Adult Health

## 2018-07-09 DIAGNOSIS — E785 Hyperlipidemia, unspecified: Secondary | ICD-10-CM

## 2018-07-09 DIAGNOSIS — I63419 Cerebral infarction due to embolism of unspecified middle cerebral artery: Secondary | ICD-10-CM | POA: Diagnosis not present

## 2018-07-09 DIAGNOSIS — E1169 Type 2 diabetes mellitus with other specified complication: Secondary | ICD-10-CM

## 2018-07-09 DIAGNOSIS — I480 Paroxysmal atrial fibrillation: Secondary | ICD-10-CM

## 2018-07-09 DIAGNOSIS — M6281 Muscle weakness (generalized): Secondary | ICD-10-CM | POA: Diagnosis not present

## 2018-07-09 DIAGNOSIS — R404 Transient alteration of awareness: Secondary | ICD-10-CM | POA: Diagnosis not present

## 2018-07-09 DIAGNOSIS — E1142 Type 2 diabetes mellitus with diabetic polyneuropathy: Secondary | ICD-10-CM

## 2018-07-09 DIAGNOSIS — I110 Hypertensive heart disease with heart failure: Secondary | ICD-10-CM | POA: Diagnosis not present

## 2018-07-09 DIAGNOSIS — F0150 Vascular dementia without behavioral disturbance: Secondary | ICD-10-CM | POA: Diagnosis not present

## 2018-07-09 DIAGNOSIS — I5022 Chronic systolic (congestive) heart failure: Secondary | ICD-10-CM | POA: Diagnosis not present

## 2018-07-09 NOTE — Progress Notes (Signed)
[facility]   [facility] Room Number: 319 B Place of Service:  SNF (31)   CODE STATUS: DNR  No Known Allergies  Chief Complaint  Patient presents with  . Medical Management of Chronic Issues    Paroxysmal atrial fibrillation; controlled type 2 diabetes mellitus with diabetic polyneuropathy without long term current use of insulin; dyslipidemia associated with type 2 diabetes mellitus; cerebral infarction due to embolism of middle cerebral artery unspecified blood vessel laterality     HPI:  She is a 85 year old long term resident of this facility being seen for the management of her chronic illnesses: afib; diabetes; dyslipidemia; cva. There are no changes in her appetite; no reports of uncontrolled pain; no reports of agitation or anxiety; her weight is stable.   Past Medical History:  Diagnosis Date  . Aortic valve sclerosis   . Bradycardia   . Cardiac pacemaker in situ    St. Jude single chamber pacemaker model 1210 SN# [id number] with St Jude 1888tc RV SN# [id number] both implanted on 04/26/2011  . CHF (congestive heart failure) (HCC)   . CVA (cerebral vascular accident) (HCC)    Right sided weakness  . Diabetes mellitus without complication (HCC)   . Dyspnea, unspecified   . Heart block    intermittent 3rd degree heart block with ventricular escape rate of approximatley 38bpm  . HFrEF (heart failure with reduced ejection fraction) (HCC)   . History of chicken pox   . History of echocardiogram 03/07/2011   Normal LV size/function. Moderate aortic root dilation, Moderate MR. Severe TR. PASP .  Marland Kitchen Hyperlipidemia, unspecified   . Hypertension   . Osteoarthritis   . Pacemaker   . Paroxysmal atrial fibrillation (HCC)    Documented in August 2016 at the time of CVA  . Stroke [facility]) 2005    Past Surgical History:  Procedure Laterality Date  . ABDOMINAL HYSTERECTOMY    . INSERT REPLACE REMOVE PACEMAKER    . JOINT REPLACEMENT Bilateral    hips    . LUMBAR FUSION  1996  . PACEMAKER PLACEMENT Left    DUAL CHAMBER PACEMAKER GENERATOR  . TOTAL KNEE ARTHROPLASTY Right 08/10/2012   Procedure: RIGHT TOTAL KNEE ARTHROPLASTY; Surgeon: Christell Faith, MD; Location: [facility] OR; Service: Orthopedics; Laterality: Right;    Social History   Socioeconomic History  . Marital status: Widowed    Spouse name: Not on file  . Number of children: 4  . Years of education: 8  . Highest education level: Not on file  Occupational History  . 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 Financial resource strain: Not on file  . Food insecurity:    Worry: Not on file    Inability: Not on file  . Transportation needs:    Medical: Not on file    Non-medical: Not on file  Tobacco Use  . Smoking status: Former Games developer  . Smokeless tobacco: Never Used  Substance and Sexual Activity  . Alcohol use: No  . Drug use: No  . Sexual activity: Not on file  Lifestyle  . Physical activity:    Days per week: Not on file    Minutes per session: Not on file  . Stress: Not on file  Relationships  . Social connections:    Talks on phone: Not on file    Gets together: Not on file    Attends religious service: Not on file  Active member of club or organization: Not on file    Attends meetings of clubs or organizations: Not on file    Relationship status: Not on file  . Intimate partner violence:    Fear of current or ex partner: Not on file    Emotionally abused: Not on file    Physically abused: Not on file    Forced sexual activity: Not on file  Other Topics Concern  . Not on file  Social History Narrative   [facility]   Widowed   4 children   Former Smoker   No Drinks   DNR   Family History  Problem Relation Age of Onset  . Cervical cancer Mother   . Stroke Sister   . Diabetes type II Brother       VITAL SIGNS BP (!) 143/89   Pulse 87   Temp (!) 97.2 F  (36.2 C)   Resp 18   Ht 5\' 2"  (1.575 m)   Wt 120 lb 12.8 oz (54.8 kg)   SpO2 96%   BMI 22.09 kg/m   Outpatient Encounter Medications as of 07/09/2018  Medication Sig  . acetaminophen (TYLENOL) 500 MG tablet Take 500 mg by mouth every 4 (four) hours as needed for mild pain or fever.  Marland Kitchen. amLODipine-benazepril (LOTREL) 10-20 MG capsule Take 1 capsule by mouth daily.   Marland Kitchen. apixaban (ELIQUIS) 2.5 MG TABS tablet Take 1 tablet (2.5 mg total) by mouth 2 (two) times daily.  . bimatoprost (LUMIGAN) 0.01 % SOLN Place 1 drop into both eyes at bedtime.   . carvedilol (COREG) 6.25 MG tablet Take 1 tablet (6.25 mg total) by mouth 2 (two) times daily with a meal.  . hydrochlorothiazide (HYDRODIURIL) 25 MG tablet Take 25 mg by mouth daily.   . Infant Care Products [facility](DERMACLOUD EX) Apply liberal amount topically to area of skin irritation prn. OK to leave at bedside  . magnesium hydroxide (MILK OF MAGNESIA) 400 MG/5ML suspension Take 30 mLs by mouth every 4 (four) hours as needed for mild constipation. For constipation/ no BM for 2 days  . Multiple Vitamin (MULTIVITAMIN WITH MINERALS) TABS tablet Take 1 tablet by mouth daily.  . potassium chloride (K-DUR,KLOR-CON) 10 MEQ tablet Take 10 mEq by mouth daily.  . Skin Protectants, Misc. (NO-STING SKIN-PREP EX) Apply 1 application to both heels topically two times daily  . UNABLE TO FIND Diet: NCS, NAS  . Wound Dressings (ALLEVYN ADHESIVE EX) Apply topically. Apply to right great toe joint / bunion every week and as needed for protection   No facility-administered encounter medications on file as of 07/09/2018.      SIGNIFICANT DIAGNOSTIC EXAMS  LABS REVIEWED: PREVIOUS:   12-06-17: wbc 2.0; hgb 13.3; hct 40.4; mcv 88.1 plt 179; glucose 86; bun 27; creat 0.87; k+ 3.7; na++ 143; ca 9.2; liver normal albumin 3.6 mag 2.3 tsh 0.971 vit B 12: 971; vit D 41.0; hgb a1c 6.3  02-01-18: chol 116; ldl 58; trig 38; hdl 50    TODAY:   06-11-18: wbc 2.2; hgb 12.9; hct 40.6; mcv  88.8; plt 213; glucose 76; bun 34; creat 0.88; k+ 3.6; na++ 141; ca 9.2 liver normal albumin 3.3 hgb a1c 5.9     Review of Systems  Unable to perform ROS: Dementia (unable to participate )   Physical Exam Constitutional:      General: She is not in acute distress.    Appearance: She is well-developed. She is not  diaphoretic.     Comments: thin  Neck:     Musculoskeletal: Neck supple.     Thyroid: No thyromegaly.  Cardiovascular:     Rate and Rhythm: Normal rate and regular rhythm.     Pulses: Normal pulses.     Heart sounds: Normal heart sounds.     Comments: Has pace maker  Pulmonary:     Effort: Pulmonary effort is normal. No respiratory distress.     Breath sounds: Normal breath sounds.  Abdominal:     General: Bowel sounds are normal. There is no distension.     Palpations: Abdomen is soft.     Tenderness: There is no abdominal tenderness.  Musculoskeletal:     Right lower leg: No edema.     Left lower leg: No edema.     Comments: Able to move all extremities; uses wheelchair   Lymphadenopathy:     Cervical: No cervical adenopathy.  Skin:    General: Skin is warm and dry.  Neurological:     Mental Status: She is alert. Mental status is at baseline.      ASSESSMENT/ PLAN:  TODAY:   1.  Paroxysmal atrial fibrillation: heart rate is stable: status post pacemaker insertion will continue eliquis 2.5 mg twice daily is taking coreg 6.25 mg twice daily for rate control   2. Controlled type 2 diabetes mellitus with diabetic polyneuropathy without long term current use of insulin is stable hgb a1c 5.9; will continue to monitor her status is on ace; and eliquis her statin was stopped   3 Cerebral infarction due to embolism of middle cerebral artery: is stable will continue eliquis 2.5 mg twice daily  4.  Dyslipidemia associated with type 2 diabetes mellitus: is stable  LDL 58; lipitor has been stopped    PREVIOUS   5.  Vascular dementia without behavioral disturbance:  is without change weight is 120 (previus 121) pounds will monitor  6. Residual stage of open-angle glaucoma bilateral: is stable will continue lumigan to both eyes nightly   7. Hypokalemia: stable k+ 3.6 will conitnue k+ 10 meq daily   8. Chronic systolic heart failure: stable EF 45-50% (05-02-16): will continue coreg 6.25 mg twice daily   9. Hypertensive heart disease with heart failure: stable 143/89: will continue lotrel 10/20 mg daily coreg 6.25 mg twice daily; hctz 25 mg daily     MD is aware of resident's narcotic use and is in agreement with current plan of care. We will attempt to wean resident as apropriate   Synthia Innocent NP [facility]  Contact [phone] Monday through Friday 8am- 5pm  After hours call [phone]

## 2018-07-11 DIAGNOSIS — E1142 Type 2 diabetes mellitus with diabetic polyneuropathy: Secondary | ICD-10-CM | POA: Diagnosis not present

## 2018-07-11 DIAGNOSIS — F0150 Vascular dementia without behavioral disturbance: Secondary | ICD-10-CM | POA: Diagnosis not present

## 2018-07-11 DIAGNOSIS — I110 Hypertensive heart disease with heart failure: Secondary | ICD-10-CM | POA: Diagnosis not present

## 2018-07-11 DIAGNOSIS — R404 Transient alteration of awareness: Secondary | ICD-10-CM | POA: Diagnosis not present

## 2018-07-11 DIAGNOSIS — M6281 Muscle weakness (generalized): Secondary | ICD-10-CM | POA: Diagnosis not present

## 2018-07-11 DIAGNOSIS — I5022 Chronic systolic (congestive) heart failure: Secondary | ICD-10-CM | POA: Diagnosis not present

## 2018-07-12 DIAGNOSIS — I480 Paroxysmal atrial fibrillation: Secondary | ICD-10-CM | POA: Diagnosis not present

## 2018-07-12 DIAGNOSIS — I472 Ventricular tachycardia: Secondary | ICD-10-CM | POA: Diagnosis not present

## 2018-07-12 DIAGNOSIS — I495 Sick sinus syndrome: Secondary | ICD-10-CM | POA: Diagnosis not present

## 2018-07-12 DIAGNOSIS — I5022 Chronic systolic (congestive) heart failure: Secondary | ICD-10-CM | POA: Diagnosis not present

## 2018-07-12 DIAGNOSIS — E118 Type 2 diabetes mellitus with unspecified complications: Secondary | ICD-10-CM | POA: Diagnosis not present

## 2018-07-12 DIAGNOSIS — Z0000 Encounter for general adult medical examination without abnormal findings: Secondary | ICD-10-CM | POA: Diagnosis not present

## 2018-07-13 DIAGNOSIS — R404 Transient alteration of awareness: Secondary | ICD-10-CM | POA: Diagnosis not present

## 2018-07-13 DIAGNOSIS — F0150 Vascular dementia without behavioral disturbance: Secondary | ICD-10-CM | POA: Diagnosis not present

## 2018-07-13 DIAGNOSIS — I5022 Chronic systolic (congestive) heart failure: Secondary | ICD-10-CM | POA: Diagnosis not present

## 2018-07-13 DIAGNOSIS — E1142 Type 2 diabetes mellitus with diabetic polyneuropathy: Secondary | ICD-10-CM | POA: Diagnosis not present

## 2018-07-13 DIAGNOSIS — M6281 Muscle weakness (generalized): Secondary | ICD-10-CM | POA: Diagnosis not present

## 2018-07-13 DIAGNOSIS — I110 Hypertensive heart disease with heart failure: Secondary | ICD-10-CM | POA: Diagnosis not present

## 2018-07-17 DIAGNOSIS — E1142 Type 2 diabetes mellitus with diabetic polyneuropathy: Secondary | ICD-10-CM | POA: Diagnosis not present

## 2018-07-17 DIAGNOSIS — F0150 Vascular dementia without behavioral disturbance: Secondary | ICD-10-CM | POA: Diagnosis not present

## 2018-07-17 DIAGNOSIS — M6281 Muscle weakness (generalized): Secondary | ICD-10-CM | POA: Diagnosis not present

## 2018-07-17 DIAGNOSIS — I110 Hypertensive heart disease with heart failure: Secondary | ICD-10-CM | POA: Diagnosis not present

## 2018-07-17 DIAGNOSIS — R404 Transient alteration of awareness: Secondary | ICD-10-CM | POA: Diagnosis not present

## 2018-07-17 DIAGNOSIS — I5022 Chronic systolic (congestive) heart failure: Secondary | ICD-10-CM | POA: Diagnosis not present

## 2018-07-20 DIAGNOSIS — I5022 Chronic systolic (congestive) heart failure: Secondary | ICD-10-CM | POA: Diagnosis not present

## 2018-07-20 DIAGNOSIS — M6281 Muscle weakness (generalized): Secondary | ICD-10-CM | POA: Diagnosis not present

## 2018-07-20 DIAGNOSIS — F0150 Vascular dementia without behavioral disturbance: Secondary | ICD-10-CM | POA: Diagnosis not present

## 2018-07-20 DIAGNOSIS — I110 Hypertensive heart disease with heart failure: Secondary | ICD-10-CM | POA: Diagnosis not present

## 2018-07-20 DIAGNOSIS — R404 Transient alteration of awareness: Secondary | ICD-10-CM | POA: Diagnosis not present

## 2018-07-20 DIAGNOSIS — E1142 Type 2 diabetes mellitus with diabetic polyneuropathy: Secondary | ICD-10-CM | POA: Diagnosis not present

## 2018-07-23 DIAGNOSIS — E1142 Type 2 diabetes mellitus with diabetic polyneuropathy: Secondary | ICD-10-CM | POA: Diagnosis not present

## 2018-07-23 DIAGNOSIS — I5022 Chronic systolic (congestive) heart failure: Secondary | ICD-10-CM | POA: Diagnosis not present

## 2018-07-23 DIAGNOSIS — F0150 Vascular dementia without behavioral disturbance: Secondary | ICD-10-CM | POA: Diagnosis not present

## 2018-07-23 DIAGNOSIS — M6281 Muscle weakness (generalized): Secondary | ICD-10-CM | POA: Diagnosis not present

## 2018-07-23 DIAGNOSIS — R404 Transient alteration of awareness: Secondary | ICD-10-CM | POA: Diagnosis not present

## 2018-07-23 DIAGNOSIS — I110 Hypertensive heart disease with heart failure: Secondary | ICD-10-CM | POA: Diagnosis not present

## 2018-07-25 DIAGNOSIS — I110 Hypertensive heart disease with heart failure: Secondary | ICD-10-CM | POA: Diagnosis not present

## 2018-07-25 DIAGNOSIS — M6281 Muscle weakness (generalized): Secondary | ICD-10-CM | POA: Diagnosis not present

## 2018-07-25 DIAGNOSIS — R404 Transient alteration of awareness: Secondary | ICD-10-CM | POA: Diagnosis not present

## 2018-07-25 DIAGNOSIS — E1142 Type 2 diabetes mellitus with diabetic polyneuropathy: Secondary | ICD-10-CM | POA: Diagnosis not present

## 2018-07-25 DIAGNOSIS — F0150 Vascular dementia without behavioral disturbance: Secondary | ICD-10-CM | POA: Diagnosis not present

## 2018-07-25 DIAGNOSIS — I5022 Chronic systolic (congestive) heart failure: Secondary | ICD-10-CM | POA: Diagnosis not present

## 2018-07-26 DIAGNOSIS — Z45018 Encounter for adjustment and management of other part of cardiac pacemaker: Secondary | ICD-10-CM | POA: Diagnosis not present

## 2018-07-26 DIAGNOSIS — Z950 Presence of cardiac pacemaker: Secondary | ICD-10-CM | POA: Diagnosis not present

## 2018-07-26 DIAGNOSIS — I459 Conduction disorder, unspecified: Secondary | ICD-10-CM | POA: Diagnosis not present

## 2018-07-27 DIAGNOSIS — I110 Hypertensive heart disease with heart failure: Secondary | ICD-10-CM | POA: Diagnosis not present

## 2018-07-27 DIAGNOSIS — F0150 Vascular dementia without behavioral disturbance: Secondary | ICD-10-CM | POA: Diagnosis not present

## 2018-07-27 DIAGNOSIS — I5022 Chronic systolic (congestive) heart failure: Secondary | ICD-10-CM | POA: Diagnosis not present

## 2018-07-27 DIAGNOSIS — R404 Transient alteration of awareness: Secondary | ICD-10-CM | POA: Diagnosis not present

## 2018-07-27 DIAGNOSIS — E1142 Type 2 diabetes mellitus with diabetic polyneuropathy: Secondary | ICD-10-CM | POA: Diagnosis not present

## 2018-07-27 DIAGNOSIS — M6281 Muscle weakness (generalized): Secondary | ICD-10-CM | POA: Diagnosis not present

## 2018-07-30 DIAGNOSIS — I110 Hypertensive heart disease with heart failure: Secondary | ICD-10-CM | POA: Diagnosis not present

## 2018-07-30 DIAGNOSIS — F0150 Vascular dementia without behavioral disturbance: Secondary | ICD-10-CM | POA: Diagnosis not present

## 2018-07-30 DIAGNOSIS — E1142 Type 2 diabetes mellitus with diabetic polyneuropathy: Secondary | ICD-10-CM | POA: Diagnosis not present

## 2018-07-30 DIAGNOSIS — M6281 Muscle weakness (generalized): Secondary | ICD-10-CM | POA: Diagnosis not present

## 2018-07-30 DIAGNOSIS — R404 Transient alteration of awareness: Secondary | ICD-10-CM | POA: Diagnosis not present

## 2018-07-30 DIAGNOSIS — I5022 Chronic systolic (congestive) heart failure: Secondary | ICD-10-CM | POA: Diagnosis not present

## 2018-07-31 DIAGNOSIS — M6281 Muscle weakness (generalized): Secondary | ICD-10-CM | POA: Diagnosis not present

## 2018-07-31 DIAGNOSIS — R404 Transient alteration of awareness: Secondary | ICD-10-CM | POA: Diagnosis not present

## 2018-07-31 DIAGNOSIS — F0150 Vascular dementia without behavioral disturbance: Secondary | ICD-10-CM | POA: Diagnosis not present

## 2018-07-31 DIAGNOSIS — E1142 Type 2 diabetes mellitus with diabetic polyneuropathy: Secondary | ICD-10-CM | POA: Diagnosis not present

## 2018-07-31 DIAGNOSIS — I110 Hypertensive heart disease with heart failure: Secondary | ICD-10-CM | POA: Diagnosis not present

## 2018-07-31 DIAGNOSIS — I5022 Chronic systolic (congestive) heart failure: Secondary | ICD-10-CM | POA: Diagnosis not present

## 2018-08-01 ENCOUNTER — Encounter: Payer: Self-pay | Admitting: Adult Health

## 2018-08-01 ENCOUNTER — Non-Acute Institutional Stay (SKILLED_NURSING_FACILITY): Payer: 59 | Admitting: Adult Health

## 2018-08-01 DIAGNOSIS — E876 Hypokalemia: Secondary | ICD-10-CM

## 2018-08-01 DIAGNOSIS — F0150 Vascular dementia without behavioral disturbance: Secondary | ICD-10-CM | POA: Diagnosis not present

## 2018-08-01 DIAGNOSIS — I5022 Chronic systolic (congestive) heart failure: Secondary | ICD-10-CM | POA: Diagnosis not present

## 2018-08-01 DIAGNOSIS — E1142 Type 2 diabetes mellitus with diabetic polyneuropathy: Secondary | ICD-10-CM | POA: Diagnosis not present

## 2018-08-01 DIAGNOSIS — I480 Paroxysmal atrial fibrillation: Secondary | ICD-10-CM

## 2018-08-01 DIAGNOSIS — H40153 Residual stage of open-angle glaucoma, bilateral: Secondary | ICD-10-CM

## 2018-08-01 DIAGNOSIS — I63419 Cerebral infarction due to embolism of unspecified middle cerebral artery: Secondary | ICD-10-CM | POA: Diagnosis not present

## 2018-08-01 DIAGNOSIS — M6281 Muscle weakness (generalized): Secondary | ICD-10-CM | POA: Diagnosis not present

## 2018-08-01 DIAGNOSIS — I110 Hypertensive heart disease with heart failure: Secondary | ICD-10-CM | POA: Diagnosis not present

## 2018-08-01 DIAGNOSIS — R404 Transient alteration of awareness: Secondary | ICD-10-CM | POA: Diagnosis not present

## 2018-08-01 NOTE — Progress Notes (Signed)
Location:  [facility] Room Number: 319-B Place of Service:  SNF [phone]) Provider:  Kenard GowerMedina-Vargas, Ameirah Khatoon, NP  Patient Care Team: Lauro RegulusAnderson, Marshall W, MD as PCP - General (Internal Medicine) Sharee HolsterGreen, Deborah S, NP as Nurse Practitioner (Geriatric Medicine)  Extended Emergency Contact Information Primary Emergency Contact: Tommie RaymondWilliams,Valerie W Address: [address]          [place], [place] [address] [place] Home Phone: [phone] Relation: Daughter Secondary Emergency Contact: Adria DevonWalker,Anthony  [place] Home Phone: [phone] Relation: Son  Code Status:  DNR  Goals of care: Advanced Directive information Advanced Directives 07/09/2018  Does Patient Have a Medical Advance Directive? Yes  Type of Estate agentAdvance Directive Healthcare Power of [place];Out of facility DNR (pink MOST or yellow form)  Does patient want to make changes to medical advance directive? No - Patient declined  Copy of Healthcare Power of Attorney in Chart? Yes - validated most recent copy scanned in chart (See row information)  Would patient like information on creating a medical advance directive? -  Pre-existing out of facility DNR order (yellow form or pink MOST form) Yellow form placed in chart (order not valid for inpatient use)     Chief Complaint  Patient presents with  . Discharge Note    [facility].    HPI:  Pt is an 85 y.o. female seen today for a discharge visit.  She is to [facility] on 08/02/2018 for continued long-term care.  [facility] since 12/30/16.  She has a PMH of atrial fibrillation, diabetes, dyslipidemia, and CVA.   Past Medical History:  Diagnosis Date  . Aortic valve sclerosis   . Bradycardia   . Cardiac pacemaker in situ    St. Jude single chamber pacemaker model 1210 SN# [id number] with St Jude 1888tc RV SN# [id number] both  implanted on 04/26/2011  . CHF (congestive heart failure) (HCC)   . CVA (cerebral vascular accident) (HCC)    Right sided weakness  . Diabetes mellitus without complication (HCC)   . Dyspnea, unspecified   . Heart block    intermittent 3rd degree heart block with ventricular escape rate of approximatley 38bpm  . HFrEF (heart failure with reduced ejection fraction) (HCC)   . History of chicken pox   . History of echocardiogram 03/07/2011   Normal LV size/function. Moderate aortic root dilation, Moderate MR. Severe TR. PASP 65mmHg.  Marland Kitchen. Hyperlipidemia, unspecified   . Hypertension   . Osteoarthritis   . Pacemaker   . Paroxysmal atrial fibrillation (HCC)    Documented in August 2016 at the time of CVA  . Stroke [facility](HCC) 2005   Past Surgical History:  Procedure Laterality Date  . ABDOMINAL HYSTERECTOMY    . INSERT REPLACE REMOVE PACEMAKER    . JOINT REPLACEMENT Bilateral    hips  . LUMBAR FUSION  1996  . PACEMAKER PLACEMENT Left    DUAL CHAMBER PACEMAKER GENERATOR  . TOTAL KNEE ARTHROPLASTY Right 08/10/2012   Procedure: RIGHT TOTAL KNEE ARTHROPLASTY; Surgeon: Christell FaithPaul Francis Lachiewicz, MD; [facility]; Service: Orthopedics; Laterality: Right;    No Known Allergies  Outpatient Encounter Medications as of 08/01/2018  Medication Sig  . acetaminophen (TYLENOL) 500 MG tablet Take 500 mg by mouth every 4 (four) hours as needed for mild pain or fever.  Marland Kitchen. amLODipine-benazepril (LOTREL) 10-20 MG capsule Take 1 capsule by mouth daily.   .Marland Kitchen  apixaban (ELIQUIS) 2.5 MG TABS tablet Take 1 tablet (2.5 mg total) by mouth 2 (two) times daily.  . bimatoprost (LUMIGAN) 0.01 % SOLN Place 1 drop into both eyes at bedtime.   . carvedilol (COREG) 6.25 MG tablet Take 1 tablet (6.25 mg total) by mouth 2 (two) times daily with a meal.  . hydrochlorothiazide (HYDRODIURIL) 25 MG tablet Take 25 mg by mouth daily.   . Infant Care Products [facility] EX) Apply liberal amount topically to area of skin irritation  prn. OK to leave at bedside  . magnesium hydroxide (MILK OF MAGNESIA) 400 MG/5ML suspension Take 30 mLs by mouth every 4 (four) hours as needed for mild constipation. For constipation/ no BM for 2 days  . Multiple Vitamin (MULTIVITAMIN WITH MINERALS) TABS tablet Take 1 tablet by mouth daily.  . potassium chloride (K-DUR,KLOR-CON) 10 MEQ tablet Take 10 mEq by mouth daily.  . Skin Protectants, Misc. (NO-STING SKIN-PREP EX) Apply 1 application to both heels topically two times daily  . UNABLE TO FIND Diet: NCS, NAS  . Wound Dressings (ALLEVYN ADHESIVE EX) Apply topically. Apply to right great toe joint / bunion every week and as needed for protection   No facility-administered encounter medications on file as of 08/01/2018.     Review of Systems Unable to obtain due to aphasia    Immunization History  Administered Date(s) Administered  . Influenza Split 06-07-202015  . Influenza, High Dose Seasonal PF 02/23/2016  . Influenza-Unspecified 02/28/2017, 03/23/2018  . Pneumococcal Conjugate-13 02/23/2016  . Pneumococcal Polysaccharide-23 01/10/2015   Pertinent  Health Maintenance Due  Topic Date Due  . FOOT EXAM  08/07/2018 (Originally 01/11/1941)  . OPHTHALMOLOGY EXAM  08/07/2018 (Originally 01/11/1941)  . DEXA SCAN  01/28/2034 (Originally 01/10/1998)  . HEMOGLOBIN A1C  12/10/2018  . INFLUENZA VACCINE  Completed  . PNA vac Low Risk Adult  Completed   Fall Risk  09/14/2015 09/14/2015 03/16/2015 03/16/2015  Falls in the past year? No No Yes No  Number falls in past yr: - - 1 -  Injury with Fall? - - Yes -  Risk for fall due to : Impaired balance/gait - - -  Follow up - - Education provided;Falls prevention discussed -     Vitals:   08/01/18 1124  BP: 132/60  Pulse: 76  Resp: 18  Temp: (!) 97.4 F (36.3 C)  TempSrc: Oral  SpO2: 95%  Weight: 120 lb 8 oz (54.7 kg)  Height: 5\' 2"  (1.575 m)   Body mass index is 22.04 kg/m.  Physical Exam  GENERAL APPEARANCE: Well nourished. In no  acute distress. Normal body habitus SKIN:  Skin is warm and dry.  MOUTH and THROAT: Lips are without lesions. Oral mucosa is moist and without lesions. RESPIRATORY: Breathing is even & unlabored, BS CTAB CARDIAC: Irregularly irregular, no murmur,no extra heart sounds, Left foot trace edema. GI: Abdomen soft, normal BS, no masses, no tenderness NEUROLOGICAL: There is no tremor. Aphasic. Right facial droop. Right side weakness. Pockets food in her mouth.  PSYCHIATRIC:  Affect and behavior are appropriate   Labs reviewed: Recent Labs    12/06/17 0615 06/11/18 0600  NA 143 141  K 3.7 3.6  CL 110 111  CO2 26 21*  GLUCOSE 86 76  BUN 27* 34*  CREATININE 0.87 0.88  CALCIUM 9.2 9.2  MG 2.3  --    Recent Labs    12/06/17 0615 06/11/18 0600  AST 25 23  ALT 26 16  ALKPHOS 72 74  BILITOT  0.7 0.6  PROT 6.6 6.4*  ALBUMIN 3.6 3.3*   Recent Labs    12/06/17 0615 06/11/18 0600  WBC 2.0* 2.2*  NEUTROABS 0.7* 0.8*  HGB 13.3 12.9  HCT 40.4 40.6  MCV 88.1 88.8  PLT 179 213   Lab Results  Component Value Date   TSH 0.971 12/06/2017   Lab Results  Component Value Date   HGBA1C 5.9 (H) 06/11/2018   Lab Results  Component Value Date   CHOL 116 02/01/2018   HDL 50 02/01/2018   LDLCALC 58 02/01/2018   TRIG 38 02/01/2018   CHOLHDL 2.3 02/01/2018    Assessment/Plan  1. Paroxysmal atrial fibrillation (HCC) -Rate controlled, continue carvedilol 6.25 mg 1 tab twice a day and Eliquis 2.5 mg 1 tab twice a day  2. Cerebral infarction due to embolism of middle cerebral artery, unspecified blood vessel laterality (HCC) -Stable, continue Eliquis 2.5 mg 1 tab twice a day, hydrochlorothiazide 25 mg 1 tab twice a day, amlodipine-benazepril 10-20 mg 1 tab daily  3. Residual stage of open-angle glaucoma, bilateral -Continue Lumigan drops 0.01% 1 drop into both eyes at bedtime  4. Hypokalemia -Continue potassium chloride extended release 10 meq 1 capsule daily Lab Results  Component  Value Date   K 3.6 06/11/2018    5. Vascular dementia without behavioral disturbance (HCC) -Continue supportive care, fall precautions    I have filled out patient's discharge paperwork.   Total discharge time: Greater than 30 minutes Greater than 50% was spent in coordination of care.   Discharge time involved coordination of the discharge process with social worker and nursing.   Kenard Gower, NP [facility] [phone] (Monday-Friday 8:00 a.m. - 5:00 p.m.) [phone] (after hours)

## 2018-08-06 DIAGNOSIS — Z8673 Personal history of transient ischemic attack (TIA), and cerebral infarction without residual deficits: Secondary | ICD-10-CM | POA: Diagnosis not present

## 2018-08-06 DIAGNOSIS — E1142 Type 2 diabetes mellitus with diabetic polyneuropathy: Secondary | ICD-10-CM | POA: Diagnosis not present

## 2018-08-06 DIAGNOSIS — E876 Hypokalemia: Secondary | ICD-10-CM | POA: Diagnosis not present

## 2018-08-06 DIAGNOSIS — R1312 Dysphagia, oropharyngeal phase: Secondary | ICD-10-CM | POA: Diagnosis not present

## 2018-08-06 DIAGNOSIS — I5022 Chronic systolic (congestive) heart failure: Secondary | ICD-10-CM | POA: Diagnosis not present

## 2018-08-06 DIAGNOSIS — M6281 Muscle weakness (generalized): Secondary | ICD-10-CM | POA: Diagnosis not present

## 2018-08-06 DIAGNOSIS — F0150 Vascular dementia without behavioral disturbance: Secondary | ICD-10-CM | POA: Diagnosis not present

## 2018-08-06 DIAGNOSIS — E785 Hyperlipidemia, unspecified: Secondary | ICD-10-CM | POA: Diagnosis not present

## 2018-08-06 DIAGNOSIS — R41841 Cognitive communication deficit: Secondary | ICD-10-CM | POA: Diagnosis not present

## 2018-08-08 DIAGNOSIS — R1312 Dysphagia, oropharyngeal phase: Secondary | ICD-10-CM | POA: Diagnosis not present

## 2018-08-08 DIAGNOSIS — I5022 Chronic systolic (congestive) heart failure: Secondary | ICD-10-CM | POA: Diagnosis not present

## 2018-08-08 DIAGNOSIS — F0150 Vascular dementia without behavioral disturbance: Secondary | ICD-10-CM | POA: Diagnosis not present

## 2018-08-08 DIAGNOSIS — E785 Hyperlipidemia, unspecified: Secondary | ICD-10-CM | POA: Diagnosis not present

## 2018-08-08 DIAGNOSIS — R41841 Cognitive communication deficit: Secondary | ICD-10-CM | POA: Diagnosis not present

## 2018-08-08 DIAGNOSIS — E1142 Type 2 diabetes mellitus with diabetic polyneuropathy: Secondary | ICD-10-CM | POA: Diagnosis not present

## 2018-08-08 DIAGNOSIS — Z8673 Personal history of transient ischemic attack (TIA), and cerebral infarction without residual deficits: Secondary | ICD-10-CM | POA: Diagnosis not present

## 2018-08-08 DIAGNOSIS — M6281 Muscle weakness (generalized): Secondary | ICD-10-CM | POA: Diagnosis not present

## 2018-08-08 DIAGNOSIS — E876 Hypokalemia: Secondary | ICD-10-CM | POA: Diagnosis not present

## 2018-08-09 DIAGNOSIS — E119 Type 2 diabetes mellitus without complications: Secondary | ICD-10-CM | POA: Diagnosis not present

## 2018-08-09 DIAGNOSIS — I4891 Unspecified atrial fibrillation: Secondary | ICD-10-CM | POA: Diagnosis not present

## 2018-08-09 DIAGNOSIS — I10 Essential (primary) hypertension: Secondary | ICD-10-CM | POA: Diagnosis not present

## 2018-08-09 DIAGNOSIS — F0390 Unspecified dementia without behavioral disturbance: Secondary | ICD-10-CM | POA: Diagnosis not present

## 2018-08-13 DIAGNOSIS — M6281 Muscle weakness (generalized): Secondary | ICD-10-CM | POA: Diagnosis not present

## 2018-08-13 DIAGNOSIS — R1312 Dysphagia, oropharyngeal phase: Secondary | ICD-10-CM | POA: Diagnosis not present

## 2018-08-13 DIAGNOSIS — E876 Hypokalemia: Secondary | ICD-10-CM | POA: Diagnosis not present

## 2018-08-13 DIAGNOSIS — E1142 Type 2 diabetes mellitus with diabetic polyneuropathy: Secondary | ICD-10-CM | POA: Diagnosis not present

## 2018-08-13 DIAGNOSIS — E785 Hyperlipidemia, unspecified: Secondary | ICD-10-CM | POA: Diagnosis not present

## 2018-08-13 DIAGNOSIS — I5022 Chronic systolic (congestive) heart failure: Secondary | ICD-10-CM | POA: Diagnosis not present

## 2018-08-13 DIAGNOSIS — R41841 Cognitive communication deficit: Secondary | ICD-10-CM | POA: Diagnosis not present

## 2018-08-13 DIAGNOSIS — Z8673 Personal history of transient ischemic attack (TIA), and cerebral infarction without residual deficits: Secondary | ICD-10-CM | POA: Diagnosis not present

## 2018-08-13 DIAGNOSIS — F0150 Vascular dementia without behavioral disturbance: Secondary | ICD-10-CM | POA: Diagnosis not present

## 2018-08-14 DIAGNOSIS — E876 Hypokalemia: Secondary | ICD-10-CM | POA: Diagnosis not present

## 2018-08-14 DIAGNOSIS — R1312 Dysphagia, oropharyngeal phase: Secondary | ICD-10-CM | POA: Diagnosis not present

## 2018-08-14 DIAGNOSIS — F0150 Vascular dementia without behavioral disturbance: Secondary | ICD-10-CM | POA: Diagnosis not present

## 2018-08-14 DIAGNOSIS — E785 Hyperlipidemia, unspecified: Secondary | ICD-10-CM | POA: Diagnosis not present

## 2018-08-14 DIAGNOSIS — R41841 Cognitive communication deficit: Secondary | ICD-10-CM | POA: Diagnosis not present

## 2018-08-14 DIAGNOSIS — E1142 Type 2 diabetes mellitus with diabetic polyneuropathy: Secondary | ICD-10-CM | POA: Diagnosis not present

## 2018-08-14 DIAGNOSIS — M6281 Muscle weakness (generalized): Secondary | ICD-10-CM | POA: Diagnosis not present

## 2018-08-14 DIAGNOSIS — I5022 Chronic systolic (congestive) heart failure: Secondary | ICD-10-CM | POA: Diagnosis not present

## 2018-08-14 DIAGNOSIS — Z8673 Personal history of transient ischemic attack (TIA), and cerebral infarction without residual deficits: Secondary | ICD-10-CM | POA: Diagnosis not present

## 2018-08-15 DIAGNOSIS — R1312 Dysphagia, oropharyngeal phase: Secondary | ICD-10-CM | POA: Diagnosis not present

## 2018-08-15 DIAGNOSIS — M6281 Muscle weakness (generalized): Secondary | ICD-10-CM | POA: Diagnosis not present

## 2018-08-15 DIAGNOSIS — E785 Hyperlipidemia, unspecified: Secondary | ICD-10-CM | POA: Diagnosis not present

## 2018-08-15 DIAGNOSIS — R41841 Cognitive communication deficit: Secondary | ICD-10-CM | POA: Diagnosis not present

## 2018-08-15 DIAGNOSIS — I5022 Chronic systolic (congestive) heart failure: Secondary | ICD-10-CM | POA: Diagnosis not present

## 2018-08-15 DIAGNOSIS — F0150 Vascular dementia without behavioral disturbance: Secondary | ICD-10-CM | POA: Diagnosis not present

## 2018-08-15 DIAGNOSIS — E1142 Type 2 diabetes mellitus with diabetic polyneuropathy: Secondary | ICD-10-CM | POA: Diagnosis not present

## 2018-08-15 DIAGNOSIS — E876 Hypokalemia: Secondary | ICD-10-CM | POA: Diagnosis not present

## 2018-08-15 DIAGNOSIS — Z8673 Personal history of transient ischemic attack (TIA), and cerebral infarction without residual deficits: Secondary | ICD-10-CM | POA: Diagnosis not present

## 2018-08-16 DIAGNOSIS — F0150 Vascular dementia without behavioral disturbance: Secondary | ICD-10-CM | POA: Diagnosis not present

## 2018-08-16 DIAGNOSIS — E785 Hyperlipidemia, unspecified: Secondary | ICD-10-CM | POA: Diagnosis not present

## 2018-08-16 DIAGNOSIS — E1142 Type 2 diabetes mellitus with diabetic polyneuropathy: Secondary | ICD-10-CM | POA: Diagnosis not present

## 2018-08-16 DIAGNOSIS — R41841 Cognitive communication deficit: Secondary | ICD-10-CM | POA: Diagnosis not present

## 2018-08-16 DIAGNOSIS — M6281 Muscle weakness (generalized): Secondary | ICD-10-CM | POA: Diagnosis not present

## 2018-08-16 DIAGNOSIS — E876 Hypokalemia: Secondary | ICD-10-CM | POA: Diagnosis not present

## 2018-08-16 DIAGNOSIS — Z8673 Personal history of transient ischemic attack (TIA), and cerebral infarction without residual deficits: Secondary | ICD-10-CM | POA: Diagnosis not present

## 2018-08-16 DIAGNOSIS — R1312 Dysphagia, oropharyngeal phase: Secondary | ICD-10-CM | POA: Diagnosis not present

## 2018-08-16 DIAGNOSIS — I5022 Chronic systolic (congestive) heart failure: Secondary | ICD-10-CM | POA: Diagnosis not present

## 2018-08-17 DIAGNOSIS — R41841 Cognitive communication deficit: Secondary | ICD-10-CM | POA: Diagnosis not present

## 2018-08-17 DIAGNOSIS — I5022 Chronic systolic (congestive) heart failure: Secondary | ICD-10-CM | POA: Diagnosis not present

## 2018-08-17 DIAGNOSIS — E876 Hypokalemia: Secondary | ICD-10-CM | POA: Diagnosis not present

## 2018-08-17 DIAGNOSIS — M6281 Muscle weakness (generalized): Secondary | ICD-10-CM | POA: Diagnosis not present

## 2018-08-17 DIAGNOSIS — R1312 Dysphagia, oropharyngeal phase: Secondary | ICD-10-CM | POA: Diagnosis not present

## 2018-08-17 DIAGNOSIS — E1142 Type 2 diabetes mellitus with diabetic polyneuropathy: Secondary | ICD-10-CM | POA: Diagnosis not present

## 2018-08-17 DIAGNOSIS — Z8673 Personal history of transient ischemic attack (TIA), and cerebral infarction without residual deficits: Secondary | ICD-10-CM | POA: Diagnosis not present

## 2018-08-17 DIAGNOSIS — E785 Hyperlipidemia, unspecified: Secondary | ICD-10-CM | POA: Diagnosis not present

## 2018-08-17 DIAGNOSIS — F0150 Vascular dementia without behavioral disturbance: Secondary | ICD-10-CM | POA: Diagnosis not present

## 2018-08-20 DIAGNOSIS — R1312 Dysphagia, oropharyngeal phase: Secondary | ICD-10-CM | POA: Diagnosis not present

## 2018-08-20 DIAGNOSIS — M6281 Muscle weakness (generalized): Secondary | ICD-10-CM | POA: Diagnosis not present

## 2018-08-20 DIAGNOSIS — E785 Hyperlipidemia, unspecified: Secondary | ICD-10-CM | POA: Diagnosis not present

## 2018-08-20 DIAGNOSIS — R41841 Cognitive communication deficit: Secondary | ICD-10-CM | POA: Diagnosis not present

## 2018-08-20 DIAGNOSIS — I5022 Chronic systolic (congestive) heart failure: Secondary | ICD-10-CM | POA: Diagnosis not present

## 2018-08-20 DIAGNOSIS — F0150 Vascular dementia without behavioral disturbance: Secondary | ICD-10-CM | POA: Diagnosis not present

## 2018-08-20 DIAGNOSIS — E1142 Type 2 diabetes mellitus with diabetic polyneuropathy: Secondary | ICD-10-CM | POA: Diagnosis not present

## 2018-08-20 DIAGNOSIS — Z8673 Personal history of transient ischemic attack (TIA), and cerebral infarction without residual deficits: Secondary | ICD-10-CM | POA: Diagnosis not present

## 2018-08-20 DIAGNOSIS — E876 Hypokalemia: Secondary | ICD-10-CM | POA: Diagnosis not present

## 2018-08-21 DIAGNOSIS — R41841 Cognitive communication deficit: Secondary | ICD-10-CM | POA: Diagnosis not present

## 2018-08-21 DIAGNOSIS — Z8673 Personal history of transient ischemic attack (TIA), and cerebral infarction without residual deficits: Secondary | ICD-10-CM | POA: Diagnosis not present

## 2018-08-21 DIAGNOSIS — E876 Hypokalemia: Secondary | ICD-10-CM | POA: Diagnosis not present

## 2018-08-21 DIAGNOSIS — I10 Essential (primary) hypertension: Secondary | ICD-10-CM | POA: Diagnosis not present

## 2018-08-21 DIAGNOSIS — E119 Type 2 diabetes mellitus without complications: Secondary | ICD-10-CM | POA: Diagnosis not present

## 2018-08-21 DIAGNOSIS — E1142 Type 2 diabetes mellitus with diabetic polyneuropathy: Secondary | ICD-10-CM | POA: Diagnosis not present

## 2018-08-21 DIAGNOSIS — I5022 Chronic systolic (congestive) heart failure: Secondary | ICD-10-CM | POA: Diagnosis not present

## 2018-08-21 DIAGNOSIS — K5900 Constipation, unspecified: Secondary | ICD-10-CM | POA: Diagnosis not present

## 2018-08-21 DIAGNOSIS — F0390 Unspecified dementia without behavioral disturbance: Secondary | ICD-10-CM | POA: Diagnosis not present

## 2018-08-21 DIAGNOSIS — Z8679 Personal history of other diseases of the circulatory system: Secondary | ICD-10-CM | POA: Diagnosis not present

## 2018-08-21 DIAGNOSIS — R1312 Dysphagia, oropharyngeal phase: Secondary | ICD-10-CM | POA: Diagnosis not present

## 2018-08-21 DIAGNOSIS — D72819 Decreased white blood cell count, unspecified: Secondary | ICD-10-CM | POA: Diagnosis not present

## 2018-08-21 DIAGNOSIS — M6281 Muscle weakness (generalized): Secondary | ICD-10-CM | POA: Diagnosis not present

## 2018-08-21 DIAGNOSIS — E785 Hyperlipidemia, unspecified: Secondary | ICD-10-CM | POA: Diagnosis not present

## 2018-08-21 DIAGNOSIS — F0150 Vascular dementia without behavioral disturbance: Secondary | ICD-10-CM | POA: Diagnosis not present

## 2018-08-21 DIAGNOSIS — I4891 Unspecified atrial fibrillation: Secondary | ICD-10-CM | POA: Diagnosis not present

## 2018-08-21 DIAGNOSIS — Z950 Presence of cardiac pacemaker: Secondary | ICD-10-CM | POA: Diagnosis not present

## 2018-08-22 DIAGNOSIS — R41841 Cognitive communication deficit: Secondary | ICD-10-CM | POA: Diagnosis not present

## 2018-08-22 DIAGNOSIS — Z8673 Personal history of transient ischemic attack (TIA), and cerebral infarction without residual deficits: Secondary | ICD-10-CM | POA: Diagnosis not present

## 2018-08-22 DIAGNOSIS — M6281 Muscle weakness (generalized): Secondary | ICD-10-CM | POA: Diagnosis not present

## 2018-08-22 DIAGNOSIS — R1312 Dysphagia, oropharyngeal phase: Secondary | ICD-10-CM | POA: Diagnosis not present

## 2018-08-22 DIAGNOSIS — E785 Hyperlipidemia, unspecified: Secondary | ICD-10-CM | POA: Diagnosis not present

## 2018-08-22 DIAGNOSIS — I5022 Chronic systolic (congestive) heart failure: Secondary | ICD-10-CM | POA: Diagnosis not present

## 2018-08-22 DIAGNOSIS — E1142 Type 2 diabetes mellitus with diabetic polyneuropathy: Secondary | ICD-10-CM | POA: Diagnosis not present

## 2018-08-22 DIAGNOSIS — F0150 Vascular dementia without behavioral disturbance: Secondary | ICD-10-CM | POA: Diagnosis not present

## 2018-08-22 DIAGNOSIS — E876 Hypokalemia: Secondary | ICD-10-CM | POA: Diagnosis not present

## 2018-08-23 DIAGNOSIS — M6281 Muscle weakness (generalized): Secondary | ICD-10-CM | POA: Diagnosis not present

## 2018-08-23 DIAGNOSIS — Z8673 Personal history of transient ischemic attack (TIA), and cerebral infarction without residual deficits: Secondary | ICD-10-CM | POA: Diagnosis not present

## 2018-08-23 DIAGNOSIS — R41841 Cognitive communication deficit: Secondary | ICD-10-CM | POA: Diagnosis not present

## 2018-08-23 DIAGNOSIS — I5022 Chronic systolic (congestive) heart failure: Secondary | ICD-10-CM | POA: Diagnosis not present

## 2018-08-23 DIAGNOSIS — E1142 Type 2 diabetes mellitus with diabetic polyneuropathy: Secondary | ICD-10-CM | POA: Diagnosis not present

## 2018-08-23 DIAGNOSIS — E876 Hypokalemia: Secondary | ICD-10-CM | POA: Diagnosis not present

## 2018-08-23 DIAGNOSIS — R1312 Dysphagia, oropharyngeal phase: Secondary | ICD-10-CM | POA: Diagnosis not present

## 2018-08-23 DIAGNOSIS — F0150 Vascular dementia without behavioral disturbance: Secondary | ICD-10-CM | POA: Diagnosis not present

## 2018-08-23 DIAGNOSIS — E785 Hyperlipidemia, unspecified: Secondary | ICD-10-CM | POA: Diagnosis not present

## 2018-08-24 DIAGNOSIS — F0150 Vascular dementia without behavioral disturbance: Secondary | ICD-10-CM | POA: Diagnosis not present

## 2018-08-24 DIAGNOSIS — E1142 Type 2 diabetes mellitus with diabetic polyneuropathy: Secondary | ICD-10-CM | POA: Diagnosis not present

## 2018-08-24 DIAGNOSIS — R41841 Cognitive communication deficit: Secondary | ICD-10-CM | POA: Diagnosis not present

## 2018-08-24 DIAGNOSIS — M6281 Muscle weakness (generalized): Secondary | ICD-10-CM | POA: Diagnosis not present

## 2018-08-24 DIAGNOSIS — R1312 Dysphagia, oropharyngeal phase: Secondary | ICD-10-CM | POA: Diagnosis not present

## 2018-08-24 DIAGNOSIS — E876 Hypokalemia: Secondary | ICD-10-CM | POA: Diagnosis not present

## 2018-08-24 DIAGNOSIS — Z8673 Personal history of transient ischemic attack (TIA), and cerebral infarction without residual deficits: Secondary | ICD-10-CM | POA: Diagnosis not present

## 2018-08-24 DIAGNOSIS — E785 Hyperlipidemia, unspecified: Secondary | ICD-10-CM | POA: Diagnosis not present

## 2018-08-24 DIAGNOSIS — I5022 Chronic systolic (congestive) heart failure: Secondary | ICD-10-CM | POA: Diagnosis not present

## 2018-08-27 DIAGNOSIS — E876 Hypokalemia: Secondary | ICD-10-CM | POA: Diagnosis not present

## 2018-08-27 DIAGNOSIS — R1312 Dysphagia, oropharyngeal phase: Secondary | ICD-10-CM | POA: Diagnosis not present

## 2018-08-27 DIAGNOSIS — I5022 Chronic systolic (congestive) heart failure: Secondary | ICD-10-CM | POA: Diagnosis not present

## 2018-08-27 DIAGNOSIS — R41841 Cognitive communication deficit: Secondary | ICD-10-CM | POA: Diagnosis not present

## 2018-08-27 DIAGNOSIS — E785 Hyperlipidemia, unspecified: Secondary | ICD-10-CM | POA: Diagnosis not present

## 2018-08-27 DIAGNOSIS — Z8673 Personal history of transient ischemic attack (TIA), and cerebral infarction without residual deficits: Secondary | ICD-10-CM | POA: Diagnosis not present

## 2018-08-27 DIAGNOSIS — M6281 Muscle weakness (generalized): Secondary | ICD-10-CM | POA: Diagnosis not present

## 2018-08-27 DIAGNOSIS — F0150 Vascular dementia without behavioral disturbance: Secondary | ICD-10-CM | POA: Diagnosis not present

## 2018-08-27 DIAGNOSIS — E1142 Type 2 diabetes mellitus with diabetic polyneuropathy: Secondary | ICD-10-CM | POA: Diagnosis not present

## 2018-08-28 DIAGNOSIS — I5022 Chronic systolic (congestive) heart failure: Secondary | ICD-10-CM | POA: Diagnosis not present

## 2018-08-28 DIAGNOSIS — E785 Hyperlipidemia, unspecified: Secondary | ICD-10-CM | POA: Diagnosis not present

## 2018-08-28 DIAGNOSIS — M6281 Muscle weakness (generalized): Secondary | ICD-10-CM | POA: Diagnosis not present

## 2018-08-28 DIAGNOSIS — E876 Hypokalemia: Secondary | ICD-10-CM | POA: Diagnosis not present

## 2018-08-28 DIAGNOSIS — R41841 Cognitive communication deficit: Secondary | ICD-10-CM | POA: Diagnosis not present

## 2018-08-28 DIAGNOSIS — F0150 Vascular dementia without behavioral disturbance: Secondary | ICD-10-CM | POA: Diagnosis not present

## 2018-08-28 DIAGNOSIS — R1312 Dysphagia, oropharyngeal phase: Secondary | ICD-10-CM | POA: Diagnosis not present

## 2018-08-28 DIAGNOSIS — Z8673 Personal history of transient ischemic attack (TIA), and cerebral infarction without residual deficits: Secondary | ICD-10-CM | POA: Diagnosis not present

## 2018-08-28 DIAGNOSIS — E1142 Type 2 diabetes mellitus with diabetic polyneuropathy: Secondary | ICD-10-CM | POA: Diagnosis not present

## 2018-08-29 DIAGNOSIS — E1142 Type 2 diabetes mellitus with diabetic polyneuropathy: Secondary | ICD-10-CM | POA: Diagnosis not present

## 2018-08-29 DIAGNOSIS — F0150 Vascular dementia without behavioral disturbance: Secondary | ICD-10-CM | POA: Diagnosis not present

## 2018-08-29 DIAGNOSIS — M6281 Muscle weakness (generalized): Secondary | ICD-10-CM | POA: Diagnosis not present

## 2018-08-29 DIAGNOSIS — R1312 Dysphagia, oropharyngeal phase: Secondary | ICD-10-CM | POA: Diagnosis not present

## 2018-08-29 DIAGNOSIS — E876 Hypokalemia: Secondary | ICD-10-CM | POA: Diagnosis not present

## 2018-08-29 DIAGNOSIS — R41841 Cognitive communication deficit: Secondary | ICD-10-CM | POA: Diagnosis not present

## 2018-08-29 DIAGNOSIS — I5022 Chronic systolic (congestive) heart failure: Secondary | ICD-10-CM | POA: Diagnosis not present

## 2018-08-29 DIAGNOSIS — E785 Hyperlipidemia, unspecified: Secondary | ICD-10-CM | POA: Diagnosis not present

## 2018-08-29 DIAGNOSIS — Z8673 Personal history of transient ischemic attack (TIA), and cerebral infarction without residual deficits: Secondary | ICD-10-CM | POA: Diagnosis not present

## 2018-08-30 DIAGNOSIS — F0150 Vascular dementia without behavioral disturbance: Secondary | ICD-10-CM | POA: Diagnosis not present

## 2018-08-30 DIAGNOSIS — M6281 Muscle weakness (generalized): Secondary | ICD-10-CM | POA: Diagnosis not present

## 2018-08-30 DIAGNOSIS — R41841 Cognitive communication deficit: Secondary | ICD-10-CM | POA: Diagnosis not present

## 2018-08-30 DIAGNOSIS — R1312 Dysphagia, oropharyngeal phase: Secondary | ICD-10-CM | POA: Diagnosis not present

## 2018-08-30 DIAGNOSIS — I5022 Chronic systolic (congestive) heart failure: Secondary | ICD-10-CM | POA: Diagnosis not present

## 2018-08-30 DIAGNOSIS — E1142 Type 2 diabetes mellitus with diabetic polyneuropathy: Secondary | ICD-10-CM | POA: Diagnosis not present

## 2018-08-30 DIAGNOSIS — E785 Hyperlipidemia, unspecified: Secondary | ICD-10-CM | POA: Diagnosis not present

## 2018-08-30 DIAGNOSIS — Z8673 Personal history of transient ischemic attack (TIA), and cerebral infarction without residual deficits: Secondary | ICD-10-CM | POA: Diagnosis not present

## 2018-08-30 DIAGNOSIS — E876 Hypokalemia: Secondary | ICD-10-CM | POA: Diagnosis not present

## 2018-08-31 DIAGNOSIS — R1312 Dysphagia, oropharyngeal phase: Secondary | ICD-10-CM | POA: Diagnosis not present

## 2018-08-31 DIAGNOSIS — E876 Hypokalemia: Secondary | ICD-10-CM | POA: Diagnosis not present

## 2018-08-31 DIAGNOSIS — E1142 Type 2 diabetes mellitus with diabetic polyneuropathy: Secondary | ICD-10-CM | POA: Diagnosis not present

## 2018-08-31 DIAGNOSIS — Z8673 Personal history of transient ischemic attack (TIA), and cerebral infarction without residual deficits: Secondary | ICD-10-CM | POA: Diagnosis not present

## 2018-08-31 DIAGNOSIS — E785 Hyperlipidemia, unspecified: Secondary | ICD-10-CM | POA: Diagnosis not present

## 2018-08-31 DIAGNOSIS — F0150 Vascular dementia without behavioral disturbance: Secondary | ICD-10-CM | POA: Diagnosis not present

## 2018-08-31 DIAGNOSIS — I5022 Chronic systolic (congestive) heart failure: Secondary | ICD-10-CM | POA: Diagnosis not present

## 2018-08-31 DIAGNOSIS — R41841 Cognitive communication deficit: Secondary | ICD-10-CM | POA: Diagnosis not present

## 2018-08-31 DIAGNOSIS — M6281 Muscle weakness (generalized): Secondary | ICD-10-CM | POA: Diagnosis not present

## 2018-09-03 DIAGNOSIS — M6281 Muscle weakness (generalized): Secondary | ICD-10-CM | POA: Diagnosis not present

## 2018-09-03 DIAGNOSIS — R41841 Cognitive communication deficit: Secondary | ICD-10-CM | POA: Diagnosis not present

## 2018-09-03 DIAGNOSIS — E785 Hyperlipidemia, unspecified: Secondary | ICD-10-CM | POA: Diagnosis not present

## 2018-09-03 DIAGNOSIS — E1142 Type 2 diabetes mellitus with diabetic polyneuropathy: Secondary | ICD-10-CM | POA: Diagnosis not present

## 2018-09-03 DIAGNOSIS — Z8673 Personal history of transient ischemic attack (TIA), and cerebral infarction without residual deficits: Secondary | ICD-10-CM | POA: Diagnosis not present

## 2018-09-03 DIAGNOSIS — R1312 Dysphagia, oropharyngeal phase: Secondary | ICD-10-CM | POA: Diagnosis not present

## 2018-09-03 DIAGNOSIS — I5022 Chronic systolic (congestive) heart failure: Secondary | ICD-10-CM | POA: Diagnosis not present

## 2018-09-03 DIAGNOSIS — F0150 Vascular dementia without behavioral disturbance: Secondary | ICD-10-CM | POA: Diagnosis not present

## 2018-09-03 DIAGNOSIS — E876 Hypokalemia: Secondary | ICD-10-CM | POA: Diagnosis not present

## 2018-09-04 DIAGNOSIS — F0150 Vascular dementia without behavioral disturbance: Secondary | ICD-10-CM | POA: Diagnosis not present

## 2018-09-04 DIAGNOSIS — E1142 Type 2 diabetes mellitus with diabetic polyneuropathy: Secondary | ICD-10-CM | POA: Diagnosis not present

## 2018-09-04 DIAGNOSIS — R1312 Dysphagia, oropharyngeal phase: Secondary | ICD-10-CM | POA: Diagnosis not present

## 2018-09-04 DIAGNOSIS — M6281 Muscle weakness (generalized): Secondary | ICD-10-CM | POA: Diagnosis not present

## 2018-09-04 DIAGNOSIS — Z8673 Personal history of transient ischemic attack (TIA), and cerebral infarction without residual deficits: Secondary | ICD-10-CM | POA: Diagnosis not present

## 2018-09-04 DIAGNOSIS — R41841 Cognitive communication deficit: Secondary | ICD-10-CM | POA: Diagnosis not present

## 2018-09-04 DIAGNOSIS — I5022 Chronic systolic (congestive) heart failure: Secondary | ICD-10-CM | POA: Diagnosis not present

## 2018-09-04 DIAGNOSIS — E785 Hyperlipidemia, unspecified: Secondary | ICD-10-CM | POA: Diagnosis not present

## 2018-09-04 DIAGNOSIS — E876 Hypokalemia: Secondary | ICD-10-CM | POA: Diagnosis not present

## 2018-09-05 DIAGNOSIS — H4010X0 Unspecified open-angle glaucoma, stage unspecified: Secondary | ICD-10-CM | POA: Diagnosis not present

## 2018-09-05 DIAGNOSIS — R1312 Dysphagia, oropharyngeal phase: Secondary | ICD-10-CM | POA: Diagnosis not present

## 2018-09-05 DIAGNOSIS — I482 Chronic atrial fibrillation, unspecified: Secondary | ICD-10-CM | POA: Diagnosis not present

## 2018-09-05 DIAGNOSIS — M6281 Muscle weakness (generalized): Secondary | ICD-10-CM | POA: Diagnosis not present

## 2018-09-05 DIAGNOSIS — F0150 Vascular dementia without behavioral disturbance: Secondary | ICD-10-CM | POA: Diagnosis not present

## 2018-09-05 DIAGNOSIS — Z950 Presence of cardiac pacemaker: Secondary | ICD-10-CM | POA: Diagnosis not present

## 2018-09-05 DIAGNOSIS — E1142 Type 2 diabetes mellitus with diabetic polyneuropathy: Secondary | ICD-10-CM | POA: Diagnosis not present

## 2018-09-05 DIAGNOSIS — E785 Hyperlipidemia, unspecified: Secondary | ICD-10-CM | POA: Diagnosis not present

## 2018-09-05 DIAGNOSIS — I5022 Chronic systolic (congestive) heart failure: Secondary | ICD-10-CM | POA: Diagnosis not present

## 2018-09-06 DIAGNOSIS — E1142 Type 2 diabetes mellitus with diabetic polyneuropathy: Secondary | ICD-10-CM | POA: Diagnosis not present

## 2018-09-06 DIAGNOSIS — M6281 Muscle weakness (generalized): Secondary | ICD-10-CM | POA: Diagnosis not present

## 2018-09-06 DIAGNOSIS — E785 Hyperlipidemia, unspecified: Secondary | ICD-10-CM | POA: Diagnosis not present

## 2018-09-06 DIAGNOSIS — I482 Chronic atrial fibrillation, unspecified: Secondary | ICD-10-CM | POA: Diagnosis not present

## 2018-09-06 DIAGNOSIS — I5022 Chronic systolic (congestive) heart failure: Secondary | ICD-10-CM | POA: Diagnosis not present

## 2018-09-06 DIAGNOSIS — R1312 Dysphagia, oropharyngeal phase: Secondary | ICD-10-CM | POA: Diagnosis not present

## 2018-09-06 DIAGNOSIS — H4010X0 Unspecified open-angle glaucoma, stage unspecified: Secondary | ICD-10-CM | POA: Diagnosis not present

## 2018-09-06 DIAGNOSIS — F0150 Vascular dementia without behavioral disturbance: Secondary | ICD-10-CM | POA: Diagnosis not present

## 2018-09-06 DIAGNOSIS — Z950 Presence of cardiac pacemaker: Secondary | ICD-10-CM | POA: Diagnosis not present

## 2018-09-07 DIAGNOSIS — H4010X0 Unspecified open-angle glaucoma, stage unspecified: Secondary | ICD-10-CM | POA: Diagnosis not present

## 2018-09-07 DIAGNOSIS — R1312 Dysphagia, oropharyngeal phase: Secondary | ICD-10-CM | POA: Diagnosis not present

## 2018-09-07 DIAGNOSIS — E785 Hyperlipidemia, unspecified: Secondary | ICD-10-CM | POA: Diagnosis not present

## 2018-09-07 DIAGNOSIS — M6281 Muscle weakness (generalized): Secondary | ICD-10-CM | POA: Diagnosis not present

## 2018-09-07 DIAGNOSIS — F0150 Vascular dementia without behavioral disturbance: Secondary | ICD-10-CM | POA: Diagnosis not present

## 2018-09-07 DIAGNOSIS — I5022 Chronic systolic (congestive) heart failure: Secondary | ICD-10-CM | POA: Diagnosis not present

## 2018-09-07 DIAGNOSIS — E1142 Type 2 diabetes mellitus with diabetic polyneuropathy: Secondary | ICD-10-CM | POA: Diagnosis not present

## 2018-09-07 DIAGNOSIS — I482 Chronic atrial fibrillation, unspecified: Secondary | ICD-10-CM | POA: Diagnosis not present

## 2018-09-07 DIAGNOSIS — Z950 Presence of cardiac pacemaker: Secondary | ICD-10-CM | POA: Diagnosis not present

## 2018-09-08 DIAGNOSIS — E1142 Type 2 diabetes mellitus with diabetic polyneuropathy: Secondary | ICD-10-CM | POA: Diagnosis not present

## 2018-09-08 DIAGNOSIS — H4010X0 Unspecified open-angle glaucoma, stage unspecified: Secondary | ICD-10-CM | POA: Diagnosis not present

## 2018-09-08 DIAGNOSIS — I5022 Chronic systolic (congestive) heart failure: Secondary | ICD-10-CM | POA: Diagnosis not present

## 2018-09-08 DIAGNOSIS — Z950 Presence of cardiac pacemaker: Secondary | ICD-10-CM | POA: Diagnosis not present

## 2018-09-08 DIAGNOSIS — F0150 Vascular dementia without behavioral disturbance: Secondary | ICD-10-CM | POA: Diagnosis not present

## 2018-09-08 DIAGNOSIS — R1312 Dysphagia, oropharyngeal phase: Secondary | ICD-10-CM | POA: Diagnosis not present

## 2018-09-08 DIAGNOSIS — M6281 Muscle weakness (generalized): Secondary | ICD-10-CM | POA: Diagnosis not present

## 2018-09-08 DIAGNOSIS — I482 Chronic atrial fibrillation, unspecified: Secondary | ICD-10-CM | POA: Diagnosis not present

## 2018-09-08 DIAGNOSIS — E785 Hyperlipidemia, unspecified: Secondary | ICD-10-CM | POA: Diagnosis not present

## 2018-09-10 DIAGNOSIS — H4010X0 Unspecified open-angle glaucoma, stage unspecified: Secondary | ICD-10-CM | POA: Diagnosis not present

## 2018-09-10 DIAGNOSIS — F0150 Vascular dementia without behavioral disturbance: Secondary | ICD-10-CM | POA: Diagnosis not present

## 2018-09-10 DIAGNOSIS — E785 Hyperlipidemia, unspecified: Secondary | ICD-10-CM | POA: Diagnosis not present

## 2018-09-10 DIAGNOSIS — Z950 Presence of cardiac pacemaker: Secondary | ICD-10-CM | POA: Diagnosis not present

## 2018-09-10 DIAGNOSIS — M6281 Muscle weakness (generalized): Secondary | ICD-10-CM | POA: Diagnosis not present

## 2018-09-10 DIAGNOSIS — I5022 Chronic systolic (congestive) heart failure: Secondary | ICD-10-CM | POA: Diagnosis not present

## 2018-09-10 DIAGNOSIS — E1142 Type 2 diabetes mellitus with diabetic polyneuropathy: Secondary | ICD-10-CM | POA: Diagnosis not present

## 2018-09-10 DIAGNOSIS — R1312 Dysphagia, oropharyngeal phase: Secondary | ICD-10-CM | POA: Diagnosis not present

## 2018-09-10 DIAGNOSIS — I482 Chronic atrial fibrillation, unspecified: Secondary | ICD-10-CM | POA: Diagnosis not present

## 2018-09-11 DIAGNOSIS — Z950 Presence of cardiac pacemaker: Secondary | ICD-10-CM | POA: Diagnosis not present

## 2018-09-11 DIAGNOSIS — I482 Chronic atrial fibrillation, unspecified: Secondary | ICD-10-CM | POA: Diagnosis not present

## 2018-09-11 DIAGNOSIS — E785 Hyperlipidemia, unspecified: Secondary | ICD-10-CM | POA: Diagnosis not present

## 2018-09-11 DIAGNOSIS — I5022 Chronic systolic (congestive) heart failure: Secondary | ICD-10-CM | POA: Diagnosis not present

## 2018-09-11 DIAGNOSIS — H4010X0 Unspecified open-angle glaucoma, stage unspecified: Secondary | ICD-10-CM | POA: Diagnosis not present

## 2018-09-11 DIAGNOSIS — M6281 Muscle weakness (generalized): Secondary | ICD-10-CM | POA: Diagnosis not present

## 2018-09-11 DIAGNOSIS — E1142 Type 2 diabetes mellitus with diabetic polyneuropathy: Secondary | ICD-10-CM | POA: Diagnosis not present

## 2018-09-11 DIAGNOSIS — R1312 Dysphagia, oropharyngeal phase: Secondary | ICD-10-CM | POA: Diagnosis not present

## 2018-09-11 DIAGNOSIS — F0150 Vascular dementia without behavioral disturbance: Secondary | ICD-10-CM | POA: Diagnosis not present

## 2018-09-12 DIAGNOSIS — H4010X0 Unspecified open-angle glaucoma, stage unspecified: Secondary | ICD-10-CM | POA: Diagnosis not present

## 2018-09-12 DIAGNOSIS — I482 Chronic atrial fibrillation, unspecified: Secondary | ICD-10-CM | POA: Diagnosis not present

## 2018-09-12 DIAGNOSIS — M6281 Muscle weakness (generalized): Secondary | ICD-10-CM | POA: Diagnosis not present

## 2018-09-12 DIAGNOSIS — E785 Hyperlipidemia, unspecified: Secondary | ICD-10-CM | POA: Diagnosis not present

## 2018-09-12 DIAGNOSIS — Z950 Presence of cardiac pacemaker: Secondary | ICD-10-CM | POA: Diagnosis not present

## 2018-09-12 DIAGNOSIS — E1142 Type 2 diabetes mellitus with diabetic polyneuropathy: Secondary | ICD-10-CM | POA: Diagnosis not present

## 2018-09-12 DIAGNOSIS — I5022 Chronic systolic (congestive) heart failure: Secondary | ICD-10-CM | POA: Diagnosis not present

## 2018-09-12 DIAGNOSIS — F0150 Vascular dementia without behavioral disturbance: Secondary | ICD-10-CM | POA: Diagnosis not present

## 2018-09-12 DIAGNOSIS — R1312 Dysphagia, oropharyngeal phase: Secondary | ICD-10-CM | POA: Diagnosis not present

## 2018-09-13 DIAGNOSIS — F0150 Vascular dementia without behavioral disturbance: Secondary | ICD-10-CM | POA: Diagnosis not present

## 2018-09-13 DIAGNOSIS — I482 Chronic atrial fibrillation, unspecified: Secondary | ICD-10-CM | POA: Diagnosis not present

## 2018-09-13 DIAGNOSIS — I5022 Chronic systolic (congestive) heart failure: Secondary | ICD-10-CM | POA: Diagnosis not present

## 2018-09-13 DIAGNOSIS — M6281 Muscle weakness (generalized): Secondary | ICD-10-CM | POA: Diagnosis not present

## 2018-09-13 DIAGNOSIS — H4010X0 Unspecified open-angle glaucoma, stage unspecified: Secondary | ICD-10-CM | POA: Diagnosis not present

## 2018-09-13 DIAGNOSIS — E1142 Type 2 diabetes mellitus with diabetic polyneuropathy: Secondary | ICD-10-CM | POA: Diagnosis not present

## 2018-09-13 DIAGNOSIS — E785 Hyperlipidemia, unspecified: Secondary | ICD-10-CM | POA: Diagnosis not present

## 2018-09-13 DIAGNOSIS — R1312 Dysphagia, oropharyngeal phase: Secondary | ICD-10-CM | POA: Diagnosis not present

## 2018-09-13 DIAGNOSIS — Z950 Presence of cardiac pacemaker: Secondary | ICD-10-CM | POA: Diagnosis not present

## 2018-09-14 DIAGNOSIS — I482 Chronic atrial fibrillation, unspecified: Secondary | ICD-10-CM | POA: Diagnosis not present

## 2018-09-14 DIAGNOSIS — E785 Hyperlipidemia, unspecified: Secondary | ICD-10-CM | POA: Diagnosis not present

## 2018-09-14 DIAGNOSIS — H4010X0 Unspecified open-angle glaucoma, stage unspecified: Secondary | ICD-10-CM | POA: Diagnosis not present

## 2018-09-14 DIAGNOSIS — F0150 Vascular dementia without behavioral disturbance: Secondary | ICD-10-CM | POA: Diagnosis not present

## 2018-09-14 DIAGNOSIS — R1312 Dysphagia, oropharyngeal phase: Secondary | ICD-10-CM | POA: Diagnosis not present

## 2018-09-14 DIAGNOSIS — E1142 Type 2 diabetes mellitus with diabetic polyneuropathy: Secondary | ICD-10-CM | POA: Diagnosis not present

## 2018-09-14 DIAGNOSIS — M6281 Muscle weakness (generalized): Secondary | ICD-10-CM | POA: Diagnosis not present

## 2018-09-14 DIAGNOSIS — I5022 Chronic systolic (congestive) heart failure: Secondary | ICD-10-CM | POA: Diagnosis not present

## 2018-09-14 DIAGNOSIS — Z950 Presence of cardiac pacemaker: Secondary | ICD-10-CM | POA: Diagnosis not present

## 2018-09-18 DIAGNOSIS — I4891 Unspecified atrial fibrillation: Secondary | ICD-10-CM | POA: Diagnosis not present

## 2018-09-18 DIAGNOSIS — F0390 Unspecified dementia without behavioral disturbance: Secondary | ICD-10-CM | POA: Diagnosis not present

## 2018-09-18 DIAGNOSIS — E119 Type 2 diabetes mellitus without complications: Secondary | ICD-10-CM | POA: Diagnosis not present

## 2018-09-18 DIAGNOSIS — I10 Essential (primary) hypertension: Secondary | ICD-10-CM | POA: Diagnosis not present

## 2018-09-19 DIAGNOSIS — I482 Chronic atrial fibrillation, unspecified: Secondary | ICD-10-CM | POA: Diagnosis not present

## 2018-09-19 DIAGNOSIS — E785 Hyperlipidemia, unspecified: Secondary | ICD-10-CM | POA: Diagnosis not present

## 2018-09-19 DIAGNOSIS — D649 Anemia, unspecified: Secondary | ICD-10-CM | POA: Diagnosis not present

## 2018-10-17 DIAGNOSIS — Z1159 Encounter for screening for other viral diseases: Secondary | ICD-10-CM | POA: Diagnosis not present

## 2018-10-19 DIAGNOSIS — I6350 Cerebral infarction due to unspecified occlusion or stenosis of unspecified cerebral artery: Secondary | ICD-10-CM | POA: Diagnosis not present

## 2018-10-19 DIAGNOSIS — I10 Essential (primary) hypertension: Secondary | ICD-10-CM | POA: Diagnosis not present

## 2018-10-19 DIAGNOSIS — F0390 Unspecified dementia without behavioral disturbance: Secondary | ICD-10-CM | POA: Diagnosis not present

## 2018-10-19 DIAGNOSIS — I4891 Unspecified atrial fibrillation: Secondary | ICD-10-CM | POA: Diagnosis not present

## 2018-10-19 DIAGNOSIS — E119 Type 2 diabetes mellitus without complications: Secondary | ICD-10-CM | POA: Diagnosis not present

## 2018-10-24 DIAGNOSIS — R279 Unspecified lack of coordination: Secondary | ICD-10-CM | POA: Diagnosis not present

## 2018-10-24 DIAGNOSIS — E785 Hyperlipidemia, unspecified: Secondary | ICD-10-CM | POA: Diagnosis not present

## 2018-10-24 DIAGNOSIS — E1142 Type 2 diabetes mellitus with diabetic polyneuropathy: Secondary | ICD-10-CM | POA: Diagnosis not present

## 2018-10-24 DIAGNOSIS — R293 Abnormal posture: Secondary | ICD-10-CM | POA: Diagnosis not present

## 2018-10-24 DIAGNOSIS — M6281 Muscle weakness (generalized): Secondary | ICD-10-CM | POA: Diagnosis not present

## 2018-10-24 DIAGNOSIS — F0150 Vascular dementia without behavioral disturbance: Secondary | ICD-10-CM | POA: Diagnosis not present

## 2018-10-24 DIAGNOSIS — I5022 Chronic systolic (congestive) heart failure: Secondary | ICD-10-CM | POA: Diagnosis not present

## 2018-10-24 DIAGNOSIS — Z8673 Personal history of transient ischemic attack (TIA), and cerebral infarction without residual deficits: Secondary | ICD-10-CM | POA: Diagnosis not present

## 2018-10-24 DIAGNOSIS — Z950 Presence of cardiac pacemaker: Secondary | ICD-10-CM | POA: Diagnosis not present

## 2018-10-25 DIAGNOSIS — E785 Hyperlipidemia, unspecified: Secondary | ICD-10-CM | POA: Diagnosis not present

## 2018-10-25 DIAGNOSIS — F0150 Vascular dementia without behavioral disturbance: Secondary | ICD-10-CM | POA: Diagnosis not present

## 2018-10-25 DIAGNOSIS — R293 Abnormal posture: Secondary | ICD-10-CM | POA: Diagnosis not present

## 2018-10-25 DIAGNOSIS — Z8673 Personal history of transient ischemic attack (TIA), and cerebral infarction without residual deficits: Secondary | ICD-10-CM | POA: Diagnosis not present

## 2018-10-25 DIAGNOSIS — I5022 Chronic systolic (congestive) heart failure: Secondary | ICD-10-CM | POA: Diagnosis not present

## 2018-10-25 DIAGNOSIS — R279 Unspecified lack of coordination: Secondary | ICD-10-CM | POA: Diagnosis not present

## 2018-10-25 DIAGNOSIS — M6281 Muscle weakness (generalized): Secondary | ICD-10-CM | POA: Diagnosis not present

## 2018-10-25 DIAGNOSIS — E1142 Type 2 diabetes mellitus with diabetic polyneuropathy: Secondary | ICD-10-CM | POA: Diagnosis not present

## 2018-10-25 DIAGNOSIS — Z950 Presence of cardiac pacemaker: Secondary | ICD-10-CM | POA: Diagnosis not present

## 2018-10-26 DIAGNOSIS — M6281 Muscle weakness (generalized): Secondary | ICD-10-CM | POA: Diagnosis not present

## 2018-10-26 DIAGNOSIS — E1142 Type 2 diabetes mellitus with diabetic polyneuropathy: Secondary | ICD-10-CM | POA: Diagnosis not present

## 2018-10-26 DIAGNOSIS — Z950 Presence of cardiac pacemaker: Secondary | ICD-10-CM | POA: Diagnosis not present

## 2018-10-26 DIAGNOSIS — I5022 Chronic systolic (congestive) heart failure: Secondary | ICD-10-CM | POA: Diagnosis not present

## 2018-10-26 DIAGNOSIS — E785 Hyperlipidemia, unspecified: Secondary | ICD-10-CM | POA: Diagnosis not present

## 2018-10-26 DIAGNOSIS — R279 Unspecified lack of coordination: Secondary | ICD-10-CM | POA: Diagnosis not present

## 2018-10-26 DIAGNOSIS — R293 Abnormal posture: Secondary | ICD-10-CM | POA: Diagnosis not present

## 2018-10-26 DIAGNOSIS — Z8673 Personal history of transient ischemic attack (TIA), and cerebral infarction without residual deficits: Secondary | ICD-10-CM | POA: Diagnosis not present

## 2018-10-26 DIAGNOSIS — F0150 Vascular dementia without behavioral disturbance: Secondary | ICD-10-CM | POA: Diagnosis not present

## 2018-10-29 DIAGNOSIS — Z8673 Personal history of transient ischemic attack (TIA), and cerebral infarction without residual deficits: Secondary | ICD-10-CM | POA: Diagnosis not present

## 2018-10-29 DIAGNOSIS — F0150 Vascular dementia without behavioral disturbance: Secondary | ICD-10-CM | POA: Diagnosis not present

## 2018-10-29 DIAGNOSIS — Z950 Presence of cardiac pacemaker: Secondary | ICD-10-CM | POA: Diagnosis not present

## 2018-10-29 DIAGNOSIS — R279 Unspecified lack of coordination: Secondary | ICD-10-CM | POA: Diagnosis not present

## 2018-10-29 DIAGNOSIS — R293 Abnormal posture: Secondary | ICD-10-CM | POA: Diagnosis not present

## 2018-10-29 DIAGNOSIS — M6281 Muscle weakness (generalized): Secondary | ICD-10-CM | POA: Diagnosis not present

## 2018-10-29 DIAGNOSIS — E1142 Type 2 diabetes mellitus with diabetic polyneuropathy: Secondary | ICD-10-CM | POA: Diagnosis not present

## 2018-10-29 DIAGNOSIS — E785 Hyperlipidemia, unspecified: Secondary | ICD-10-CM | POA: Diagnosis not present

## 2018-10-29 DIAGNOSIS — I5022 Chronic systolic (congestive) heart failure: Secondary | ICD-10-CM | POA: Diagnosis not present

## 2018-10-30 DIAGNOSIS — Z8673 Personal history of transient ischemic attack (TIA), and cerebral infarction without residual deficits: Secondary | ICD-10-CM | POA: Diagnosis not present

## 2018-10-30 DIAGNOSIS — R293 Abnormal posture: Secondary | ICD-10-CM | POA: Diagnosis not present

## 2018-10-30 DIAGNOSIS — I5022 Chronic systolic (congestive) heart failure: Secondary | ICD-10-CM | POA: Diagnosis not present

## 2018-10-30 DIAGNOSIS — F0150 Vascular dementia without behavioral disturbance: Secondary | ICD-10-CM | POA: Diagnosis not present

## 2018-10-30 DIAGNOSIS — E1142 Type 2 diabetes mellitus with diabetic polyneuropathy: Secondary | ICD-10-CM | POA: Diagnosis not present

## 2018-10-30 DIAGNOSIS — Z950 Presence of cardiac pacemaker: Secondary | ICD-10-CM | POA: Diagnosis not present

## 2018-10-30 DIAGNOSIS — R279 Unspecified lack of coordination: Secondary | ICD-10-CM | POA: Diagnosis not present

## 2018-10-30 DIAGNOSIS — E785 Hyperlipidemia, unspecified: Secondary | ICD-10-CM | POA: Diagnosis not present

## 2018-10-30 DIAGNOSIS — M6281 Muscle weakness (generalized): Secondary | ICD-10-CM | POA: Diagnosis not present

## 2018-10-31 DIAGNOSIS — E1142 Type 2 diabetes mellitus with diabetic polyneuropathy: Secondary | ICD-10-CM | POA: Diagnosis not present

## 2018-10-31 DIAGNOSIS — I5022 Chronic systolic (congestive) heart failure: Secondary | ICD-10-CM | POA: Diagnosis not present

## 2018-10-31 DIAGNOSIS — R279 Unspecified lack of coordination: Secondary | ICD-10-CM | POA: Diagnosis not present

## 2018-10-31 DIAGNOSIS — Z950 Presence of cardiac pacemaker: Secondary | ICD-10-CM | POA: Diagnosis not present

## 2018-10-31 DIAGNOSIS — Z8673 Personal history of transient ischemic attack (TIA), and cerebral infarction without residual deficits: Secondary | ICD-10-CM | POA: Diagnosis not present

## 2018-10-31 DIAGNOSIS — F0150 Vascular dementia without behavioral disturbance: Secondary | ICD-10-CM | POA: Diagnosis not present

## 2018-10-31 DIAGNOSIS — E785 Hyperlipidemia, unspecified: Secondary | ICD-10-CM | POA: Diagnosis not present

## 2018-10-31 DIAGNOSIS — R293 Abnormal posture: Secondary | ICD-10-CM | POA: Diagnosis not present

## 2018-10-31 DIAGNOSIS — M6281 Muscle weakness (generalized): Secondary | ICD-10-CM | POA: Diagnosis not present

## 2018-11-02 DIAGNOSIS — M79674 Pain in right toe(s): Secondary | ICD-10-CM | POA: Diagnosis not present

## 2018-11-02 DIAGNOSIS — B351 Tinea unguium: Secondary | ICD-10-CM | POA: Diagnosis not present

## 2018-11-02 DIAGNOSIS — M79675 Pain in left toe(s): Secondary | ICD-10-CM | POA: Diagnosis not present

## 2018-11-02 DIAGNOSIS — I739 Peripheral vascular disease, unspecified: Secondary | ICD-10-CM | POA: Diagnosis not present

## 2018-11-03 DIAGNOSIS — Z8673 Personal history of transient ischemic attack (TIA), and cerebral infarction without residual deficits: Secondary | ICD-10-CM | POA: Diagnosis not present

## 2018-11-03 DIAGNOSIS — E785 Hyperlipidemia, unspecified: Secondary | ICD-10-CM | POA: Diagnosis not present

## 2018-11-03 DIAGNOSIS — Z950 Presence of cardiac pacemaker: Secondary | ICD-10-CM | POA: Diagnosis not present

## 2018-11-03 DIAGNOSIS — R293 Abnormal posture: Secondary | ICD-10-CM | POA: Diagnosis not present

## 2018-11-03 DIAGNOSIS — I5022 Chronic systolic (congestive) heart failure: Secondary | ICD-10-CM | POA: Diagnosis not present

## 2018-11-03 DIAGNOSIS — E1142 Type 2 diabetes mellitus with diabetic polyneuropathy: Secondary | ICD-10-CM | POA: Diagnosis not present

## 2018-11-03 DIAGNOSIS — R279 Unspecified lack of coordination: Secondary | ICD-10-CM | POA: Diagnosis not present

## 2018-11-03 DIAGNOSIS — F0150 Vascular dementia without behavioral disturbance: Secondary | ICD-10-CM | POA: Diagnosis not present

## 2018-11-03 DIAGNOSIS — M6281 Muscle weakness (generalized): Secondary | ICD-10-CM | POA: Diagnosis not present

## 2018-11-05 DIAGNOSIS — I482 Chronic atrial fibrillation, unspecified: Secondary | ICD-10-CM | POA: Diagnosis not present

## 2018-11-05 DIAGNOSIS — I5022 Chronic systolic (congestive) heart failure: Secondary | ICD-10-CM | POA: Diagnosis not present

## 2018-11-05 DIAGNOSIS — R293 Abnormal posture: Secondary | ICD-10-CM | POA: Diagnosis not present

## 2018-11-05 DIAGNOSIS — Z8673 Personal history of transient ischemic attack (TIA), and cerebral infarction without residual deficits: Secondary | ICD-10-CM | POA: Diagnosis not present

## 2018-11-05 DIAGNOSIS — R279 Unspecified lack of coordination: Secondary | ICD-10-CM | POA: Diagnosis not present

## 2018-11-05 DIAGNOSIS — E785 Hyperlipidemia, unspecified: Secondary | ICD-10-CM | POA: Diagnosis not present

## 2018-11-05 DIAGNOSIS — F0150 Vascular dementia without behavioral disturbance: Secondary | ICD-10-CM | POA: Diagnosis not present

## 2018-11-05 DIAGNOSIS — M6281 Muscle weakness (generalized): Secondary | ICD-10-CM | POA: Diagnosis not present

## 2018-11-05 DIAGNOSIS — E1142 Type 2 diabetes mellitus with diabetic polyneuropathy: Secondary | ICD-10-CM | POA: Diagnosis not present

## 2018-11-06 DIAGNOSIS — Z8673 Personal history of transient ischemic attack (TIA), and cerebral infarction without residual deficits: Secondary | ICD-10-CM | POA: Diagnosis not present

## 2018-11-06 DIAGNOSIS — E785 Hyperlipidemia, unspecified: Secondary | ICD-10-CM | POA: Diagnosis not present

## 2018-11-06 DIAGNOSIS — R279 Unspecified lack of coordination: Secondary | ICD-10-CM | POA: Diagnosis not present

## 2018-11-06 DIAGNOSIS — M6281 Muscle weakness (generalized): Secondary | ICD-10-CM | POA: Diagnosis not present

## 2018-11-06 DIAGNOSIS — F0150 Vascular dementia without behavioral disturbance: Secondary | ICD-10-CM | POA: Diagnosis not present

## 2018-11-06 DIAGNOSIS — R293 Abnormal posture: Secondary | ICD-10-CM | POA: Diagnosis not present

## 2018-11-06 DIAGNOSIS — E1142 Type 2 diabetes mellitus with diabetic polyneuropathy: Secondary | ICD-10-CM | POA: Diagnosis not present

## 2018-11-06 DIAGNOSIS — I482 Chronic atrial fibrillation, unspecified: Secondary | ICD-10-CM | POA: Diagnosis not present

## 2018-11-06 DIAGNOSIS — I5022 Chronic systolic (congestive) heart failure: Secondary | ICD-10-CM | POA: Diagnosis not present

## 2018-11-07 DIAGNOSIS — F0150 Vascular dementia without behavioral disturbance: Secondary | ICD-10-CM | POA: Diagnosis not present

## 2018-11-07 DIAGNOSIS — I482 Chronic atrial fibrillation, unspecified: Secondary | ICD-10-CM | POA: Diagnosis not present

## 2018-11-07 DIAGNOSIS — M6281 Muscle weakness (generalized): Secondary | ICD-10-CM | POA: Diagnosis not present

## 2018-11-07 DIAGNOSIS — I5022 Chronic systolic (congestive) heart failure: Secondary | ICD-10-CM | POA: Diagnosis not present

## 2018-11-07 DIAGNOSIS — R279 Unspecified lack of coordination: Secondary | ICD-10-CM | POA: Diagnosis not present

## 2018-11-07 DIAGNOSIS — E785 Hyperlipidemia, unspecified: Secondary | ICD-10-CM | POA: Diagnosis not present

## 2018-11-07 DIAGNOSIS — Z8673 Personal history of transient ischemic attack (TIA), and cerebral infarction without residual deficits: Secondary | ICD-10-CM | POA: Diagnosis not present

## 2018-11-07 DIAGNOSIS — R293 Abnormal posture: Secondary | ICD-10-CM | POA: Diagnosis not present

## 2018-11-07 DIAGNOSIS — E1142 Type 2 diabetes mellitus with diabetic polyneuropathy: Secondary | ICD-10-CM | POA: Diagnosis not present

## 2018-11-08 DIAGNOSIS — E785 Hyperlipidemia, unspecified: Secondary | ICD-10-CM | POA: Diagnosis not present

## 2018-11-08 DIAGNOSIS — Z8673 Personal history of transient ischemic attack (TIA), and cerebral infarction without residual deficits: Secondary | ICD-10-CM | POA: Diagnosis not present

## 2018-11-08 DIAGNOSIS — F0150 Vascular dementia without behavioral disturbance: Secondary | ICD-10-CM | POA: Diagnosis not present

## 2018-11-08 DIAGNOSIS — R279 Unspecified lack of coordination: Secondary | ICD-10-CM | POA: Diagnosis not present

## 2018-11-08 DIAGNOSIS — I482 Chronic atrial fibrillation, unspecified: Secondary | ICD-10-CM | POA: Diagnosis not present

## 2018-11-08 DIAGNOSIS — M6281 Muscle weakness (generalized): Secondary | ICD-10-CM | POA: Diagnosis not present

## 2018-11-08 DIAGNOSIS — R293 Abnormal posture: Secondary | ICD-10-CM | POA: Diagnosis not present

## 2018-11-08 DIAGNOSIS — I5022 Chronic systolic (congestive) heart failure: Secondary | ICD-10-CM | POA: Diagnosis not present

## 2018-11-08 DIAGNOSIS — E1142 Type 2 diabetes mellitus with diabetic polyneuropathy: Secondary | ICD-10-CM | POA: Diagnosis not present

## 2018-11-09 DIAGNOSIS — I5022 Chronic systolic (congestive) heart failure: Secondary | ICD-10-CM | POA: Diagnosis not present

## 2018-11-09 DIAGNOSIS — I482 Chronic atrial fibrillation, unspecified: Secondary | ICD-10-CM | POA: Diagnosis not present

## 2018-11-09 DIAGNOSIS — R293 Abnormal posture: Secondary | ICD-10-CM | POA: Diagnosis not present

## 2018-11-09 DIAGNOSIS — Z8673 Personal history of transient ischemic attack (TIA), and cerebral infarction without residual deficits: Secondary | ICD-10-CM | POA: Diagnosis not present

## 2018-11-09 DIAGNOSIS — E1142 Type 2 diabetes mellitus with diabetic polyneuropathy: Secondary | ICD-10-CM | POA: Diagnosis not present

## 2018-11-09 DIAGNOSIS — M6281 Muscle weakness (generalized): Secondary | ICD-10-CM | POA: Diagnosis not present

## 2018-11-09 DIAGNOSIS — E785 Hyperlipidemia, unspecified: Secondary | ICD-10-CM | POA: Diagnosis not present

## 2018-11-09 DIAGNOSIS — F0150 Vascular dementia without behavioral disturbance: Secondary | ICD-10-CM | POA: Diagnosis not present

## 2018-11-09 DIAGNOSIS — R279 Unspecified lack of coordination: Secondary | ICD-10-CM | POA: Diagnosis not present

## 2018-11-12 DIAGNOSIS — Z8673 Personal history of transient ischemic attack (TIA), and cerebral infarction without residual deficits: Secondary | ICD-10-CM | POA: Diagnosis not present

## 2018-11-12 DIAGNOSIS — E785 Hyperlipidemia, unspecified: Secondary | ICD-10-CM | POA: Diagnosis not present

## 2018-11-12 DIAGNOSIS — I482 Chronic atrial fibrillation, unspecified: Secondary | ICD-10-CM | POA: Diagnosis not present

## 2018-11-12 DIAGNOSIS — R279 Unspecified lack of coordination: Secondary | ICD-10-CM | POA: Diagnosis not present

## 2018-11-12 DIAGNOSIS — R293 Abnormal posture: Secondary | ICD-10-CM | POA: Diagnosis not present

## 2018-11-12 DIAGNOSIS — I5022 Chronic systolic (congestive) heart failure: Secondary | ICD-10-CM | POA: Diagnosis not present

## 2018-11-12 DIAGNOSIS — M6281 Muscle weakness (generalized): Secondary | ICD-10-CM | POA: Diagnosis not present

## 2018-11-12 DIAGNOSIS — E1142 Type 2 diabetes mellitus with diabetic polyneuropathy: Secondary | ICD-10-CM | POA: Diagnosis not present

## 2018-11-12 DIAGNOSIS — F0150 Vascular dementia without behavioral disturbance: Secondary | ICD-10-CM | POA: Diagnosis not present

## 2018-11-13 DIAGNOSIS — Z8673 Personal history of transient ischemic attack (TIA), and cerebral infarction without residual deficits: Secondary | ICD-10-CM | POA: Diagnosis not present

## 2018-11-13 DIAGNOSIS — E1142 Type 2 diabetes mellitus with diabetic polyneuropathy: Secondary | ICD-10-CM | POA: Diagnosis not present

## 2018-11-13 DIAGNOSIS — I5022 Chronic systolic (congestive) heart failure: Secondary | ICD-10-CM | POA: Diagnosis not present

## 2018-11-13 DIAGNOSIS — M6281 Muscle weakness (generalized): Secondary | ICD-10-CM | POA: Diagnosis not present

## 2018-11-13 DIAGNOSIS — F0150 Vascular dementia without behavioral disturbance: Secondary | ICD-10-CM | POA: Diagnosis not present

## 2018-11-13 DIAGNOSIS — R293 Abnormal posture: Secondary | ICD-10-CM | POA: Diagnosis not present

## 2018-11-13 DIAGNOSIS — R279 Unspecified lack of coordination: Secondary | ICD-10-CM | POA: Diagnosis not present

## 2018-11-13 DIAGNOSIS — I482 Chronic atrial fibrillation, unspecified: Secondary | ICD-10-CM | POA: Diagnosis not present

## 2018-11-13 DIAGNOSIS — E785 Hyperlipidemia, unspecified: Secondary | ICD-10-CM | POA: Diagnosis not present

## 2018-11-14 DIAGNOSIS — E1142 Type 2 diabetes mellitus with diabetic polyneuropathy: Secondary | ICD-10-CM | POA: Diagnosis not present

## 2018-11-14 DIAGNOSIS — I5022 Chronic systolic (congestive) heart failure: Secondary | ICD-10-CM | POA: Diagnosis not present

## 2018-11-14 DIAGNOSIS — M6281 Muscle weakness (generalized): Secondary | ICD-10-CM | POA: Diagnosis not present

## 2018-11-14 DIAGNOSIS — E785 Hyperlipidemia, unspecified: Secondary | ICD-10-CM | POA: Diagnosis not present

## 2018-11-14 DIAGNOSIS — R279 Unspecified lack of coordination: Secondary | ICD-10-CM | POA: Diagnosis not present

## 2018-11-14 DIAGNOSIS — F0150 Vascular dementia without behavioral disturbance: Secondary | ICD-10-CM | POA: Diagnosis not present

## 2018-11-14 DIAGNOSIS — I482 Chronic atrial fibrillation, unspecified: Secondary | ICD-10-CM | POA: Diagnosis not present

## 2018-11-14 DIAGNOSIS — Z8673 Personal history of transient ischemic attack (TIA), and cerebral infarction without residual deficits: Secondary | ICD-10-CM | POA: Diagnosis not present

## 2018-11-14 DIAGNOSIS — R293 Abnormal posture: Secondary | ICD-10-CM | POA: Diagnosis not present

## 2018-11-15 DIAGNOSIS — M6281 Muscle weakness (generalized): Secondary | ICD-10-CM | POA: Diagnosis not present

## 2018-11-15 DIAGNOSIS — E785 Hyperlipidemia, unspecified: Secondary | ICD-10-CM | POA: Diagnosis not present

## 2018-11-15 DIAGNOSIS — F0150 Vascular dementia without behavioral disturbance: Secondary | ICD-10-CM | POA: Diagnosis not present

## 2018-11-15 DIAGNOSIS — Z8673 Personal history of transient ischemic attack (TIA), and cerebral infarction without residual deficits: Secondary | ICD-10-CM | POA: Diagnosis not present

## 2018-11-15 DIAGNOSIS — I482 Chronic atrial fibrillation, unspecified: Secondary | ICD-10-CM | POA: Diagnosis not present

## 2018-11-15 DIAGNOSIS — I5022 Chronic systolic (congestive) heart failure: Secondary | ICD-10-CM | POA: Diagnosis not present

## 2018-11-15 DIAGNOSIS — E1142 Type 2 diabetes mellitus with diabetic polyneuropathy: Secondary | ICD-10-CM | POA: Diagnosis not present

## 2018-11-15 DIAGNOSIS — R279 Unspecified lack of coordination: Secondary | ICD-10-CM | POA: Diagnosis not present

## 2018-11-15 DIAGNOSIS — R293 Abnormal posture: Secondary | ICD-10-CM | POA: Diagnosis not present

## 2018-11-16 DIAGNOSIS — R279 Unspecified lack of coordination: Secondary | ICD-10-CM | POA: Diagnosis not present

## 2018-11-16 DIAGNOSIS — E1142 Type 2 diabetes mellitus with diabetic polyneuropathy: Secondary | ICD-10-CM | POA: Diagnosis not present

## 2018-11-16 DIAGNOSIS — F0150 Vascular dementia without behavioral disturbance: Secondary | ICD-10-CM | POA: Diagnosis not present

## 2018-11-16 DIAGNOSIS — Z8673 Personal history of transient ischemic attack (TIA), and cerebral infarction without residual deficits: Secondary | ICD-10-CM | POA: Diagnosis not present

## 2018-11-16 DIAGNOSIS — R293 Abnormal posture: Secondary | ICD-10-CM | POA: Diagnosis not present

## 2018-11-16 DIAGNOSIS — I482 Chronic atrial fibrillation, unspecified: Secondary | ICD-10-CM | POA: Diagnosis not present

## 2018-11-16 DIAGNOSIS — M6281 Muscle weakness (generalized): Secondary | ICD-10-CM | POA: Diagnosis not present

## 2018-11-16 DIAGNOSIS — I5022 Chronic systolic (congestive) heart failure: Secondary | ICD-10-CM | POA: Diagnosis not present

## 2018-11-16 DIAGNOSIS — E785 Hyperlipidemia, unspecified: Secondary | ICD-10-CM | POA: Diagnosis not present

## 2018-11-19 DIAGNOSIS — M6281 Muscle weakness (generalized): Secondary | ICD-10-CM | POA: Diagnosis not present

## 2018-11-19 DIAGNOSIS — I482 Chronic atrial fibrillation, unspecified: Secondary | ICD-10-CM | POA: Diagnosis not present

## 2018-11-19 DIAGNOSIS — I5022 Chronic systolic (congestive) heart failure: Secondary | ICD-10-CM | POA: Diagnosis not present

## 2018-11-19 DIAGNOSIS — R279 Unspecified lack of coordination: Secondary | ICD-10-CM | POA: Diagnosis not present

## 2018-11-19 DIAGNOSIS — F0150 Vascular dementia without behavioral disturbance: Secondary | ICD-10-CM | POA: Diagnosis not present

## 2018-11-19 DIAGNOSIS — E1142 Type 2 diabetes mellitus with diabetic polyneuropathy: Secondary | ICD-10-CM | POA: Diagnosis not present

## 2018-11-19 DIAGNOSIS — R293 Abnormal posture: Secondary | ICD-10-CM | POA: Diagnosis not present

## 2018-11-19 DIAGNOSIS — Z8673 Personal history of transient ischemic attack (TIA), and cerebral infarction without residual deficits: Secondary | ICD-10-CM | POA: Diagnosis not present

## 2018-11-19 DIAGNOSIS — E785 Hyperlipidemia, unspecified: Secondary | ICD-10-CM | POA: Diagnosis not present

## 2018-11-20 DIAGNOSIS — Z8673 Personal history of transient ischemic attack (TIA), and cerebral infarction without residual deficits: Secondary | ICD-10-CM | POA: Diagnosis not present

## 2018-11-20 DIAGNOSIS — E1142 Type 2 diabetes mellitus with diabetic polyneuropathy: Secondary | ICD-10-CM | POA: Diagnosis not present

## 2018-11-20 DIAGNOSIS — R293 Abnormal posture: Secondary | ICD-10-CM | POA: Diagnosis not present

## 2018-11-20 DIAGNOSIS — E785 Hyperlipidemia, unspecified: Secondary | ICD-10-CM | POA: Diagnosis not present

## 2018-11-20 DIAGNOSIS — M6281 Muscle weakness (generalized): Secondary | ICD-10-CM | POA: Diagnosis not present

## 2018-11-20 DIAGNOSIS — I5022 Chronic systolic (congestive) heart failure: Secondary | ICD-10-CM | POA: Diagnosis not present

## 2018-11-20 DIAGNOSIS — I482 Chronic atrial fibrillation, unspecified: Secondary | ICD-10-CM | POA: Diagnosis not present

## 2018-11-20 DIAGNOSIS — F0150 Vascular dementia without behavioral disturbance: Secondary | ICD-10-CM | POA: Diagnosis not present

## 2018-11-20 DIAGNOSIS — R279 Unspecified lack of coordination: Secondary | ICD-10-CM | POA: Diagnosis not present

## 2018-11-21 DIAGNOSIS — Z8673 Personal history of transient ischemic attack (TIA), and cerebral infarction without residual deficits: Secondary | ICD-10-CM | POA: Diagnosis not present

## 2018-11-21 DIAGNOSIS — F0150 Vascular dementia without behavioral disturbance: Secondary | ICD-10-CM | POA: Diagnosis not present

## 2018-11-21 DIAGNOSIS — R279 Unspecified lack of coordination: Secondary | ICD-10-CM | POA: Diagnosis not present

## 2018-11-21 DIAGNOSIS — E785 Hyperlipidemia, unspecified: Secondary | ICD-10-CM | POA: Diagnosis not present

## 2018-11-21 DIAGNOSIS — R293 Abnormal posture: Secondary | ICD-10-CM | POA: Diagnosis not present

## 2018-11-21 DIAGNOSIS — I5022 Chronic systolic (congestive) heart failure: Secondary | ICD-10-CM | POA: Diagnosis not present

## 2018-11-21 DIAGNOSIS — E1142 Type 2 diabetes mellitus with diabetic polyneuropathy: Secondary | ICD-10-CM | POA: Diagnosis not present

## 2018-11-21 DIAGNOSIS — I482 Chronic atrial fibrillation, unspecified: Secondary | ICD-10-CM | POA: Diagnosis not present

## 2018-11-21 DIAGNOSIS — M6281 Muscle weakness (generalized): Secondary | ICD-10-CM | POA: Diagnosis not present

## 2018-12-10 DIAGNOSIS — Z8673 Personal history of transient ischemic attack (TIA), and cerebral infarction without residual deficits: Secondary | ICD-10-CM | POA: Diagnosis not present

## 2018-12-10 DIAGNOSIS — I5022 Chronic systolic (congestive) heart failure: Secondary | ICD-10-CM | POA: Diagnosis not present

## 2018-12-10 DIAGNOSIS — R1312 Dysphagia, oropharyngeal phase: Secondary | ICD-10-CM | POA: Diagnosis not present

## 2018-12-10 DIAGNOSIS — E876 Hypokalemia: Secondary | ICD-10-CM | POA: Diagnosis not present

## 2018-12-10 DIAGNOSIS — R41841 Cognitive communication deficit: Secondary | ICD-10-CM | POA: Diagnosis not present

## 2018-12-10 DIAGNOSIS — E1142 Type 2 diabetes mellitus with diabetic polyneuropathy: Secondary | ICD-10-CM | POA: Diagnosis not present

## 2018-12-10 DIAGNOSIS — F0150 Vascular dementia without behavioral disturbance: Secondary | ICD-10-CM | POA: Diagnosis not present

## 2018-12-10 DIAGNOSIS — M6281 Muscle weakness (generalized): Secondary | ICD-10-CM | POA: Diagnosis not present

## 2018-12-10 DIAGNOSIS — E785 Hyperlipidemia, unspecified: Secondary | ICD-10-CM | POA: Diagnosis not present

## 2018-12-11 DIAGNOSIS — R1312 Dysphagia, oropharyngeal phase: Secondary | ICD-10-CM | POA: Diagnosis not present

## 2018-12-11 DIAGNOSIS — F0150 Vascular dementia without behavioral disturbance: Secondary | ICD-10-CM | POA: Diagnosis not present

## 2018-12-11 DIAGNOSIS — M6281 Muscle weakness (generalized): Secondary | ICD-10-CM | POA: Diagnosis not present

## 2018-12-11 DIAGNOSIS — R41841 Cognitive communication deficit: Secondary | ICD-10-CM | POA: Diagnosis not present

## 2018-12-11 DIAGNOSIS — E876 Hypokalemia: Secondary | ICD-10-CM | POA: Diagnosis not present

## 2018-12-11 DIAGNOSIS — E785 Hyperlipidemia, unspecified: Secondary | ICD-10-CM | POA: Diagnosis not present

## 2018-12-11 DIAGNOSIS — I5022 Chronic systolic (congestive) heart failure: Secondary | ICD-10-CM | POA: Diagnosis not present

## 2018-12-11 DIAGNOSIS — Z8673 Personal history of transient ischemic attack (TIA), and cerebral infarction without residual deficits: Secondary | ICD-10-CM | POA: Diagnosis not present

## 2018-12-11 DIAGNOSIS — E1142 Type 2 diabetes mellitus with diabetic polyneuropathy: Secondary | ICD-10-CM | POA: Diagnosis not present

## 2018-12-12 DIAGNOSIS — M6281 Muscle weakness (generalized): Secondary | ICD-10-CM | POA: Diagnosis not present

## 2018-12-12 DIAGNOSIS — R1312 Dysphagia, oropharyngeal phase: Secondary | ICD-10-CM | POA: Diagnosis not present

## 2018-12-12 DIAGNOSIS — Z8673 Personal history of transient ischemic attack (TIA), and cerebral infarction without residual deficits: Secondary | ICD-10-CM | POA: Diagnosis not present

## 2018-12-12 DIAGNOSIS — E1142 Type 2 diabetes mellitus with diabetic polyneuropathy: Secondary | ICD-10-CM | POA: Diagnosis not present

## 2018-12-12 DIAGNOSIS — E876 Hypokalemia: Secondary | ICD-10-CM | POA: Diagnosis not present

## 2018-12-12 DIAGNOSIS — F0150 Vascular dementia without behavioral disturbance: Secondary | ICD-10-CM | POA: Diagnosis not present

## 2018-12-12 DIAGNOSIS — I5022 Chronic systolic (congestive) heart failure: Secondary | ICD-10-CM | POA: Diagnosis not present

## 2018-12-12 DIAGNOSIS — E785 Hyperlipidemia, unspecified: Secondary | ICD-10-CM | POA: Diagnosis not present

## 2018-12-12 DIAGNOSIS — R41841 Cognitive communication deficit: Secondary | ICD-10-CM | POA: Diagnosis not present

## 2018-12-13 DIAGNOSIS — R41841 Cognitive communication deficit: Secondary | ICD-10-CM | POA: Diagnosis not present

## 2018-12-13 DIAGNOSIS — E876 Hypokalemia: Secondary | ICD-10-CM | POA: Diagnosis not present

## 2018-12-13 DIAGNOSIS — M6281 Muscle weakness (generalized): Secondary | ICD-10-CM | POA: Diagnosis not present

## 2018-12-13 DIAGNOSIS — I5022 Chronic systolic (congestive) heart failure: Secondary | ICD-10-CM | POA: Diagnosis not present

## 2018-12-13 DIAGNOSIS — R1312 Dysphagia, oropharyngeal phase: Secondary | ICD-10-CM | POA: Diagnosis not present

## 2018-12-13 DIAGNOSIS — Z8673 Personal history of transient ischemic attack (TIA), and cerebral infarction without residual deficits: Secondary | ICD-10-CM | POA: Diagnosis not present

## 2018-12-13 DIAGNOSIS — E1142 Type 2 diabetes mellitus with diabetic polyneuropathy: Secondary | ICD-10-CM | POA: Diagnosis not present

## 2018-12-13 DIAGNOSIS — E785 Hyperlipidemia, unspecified: Secondary | ICD-10-CM | POA: Diagnosis not present

## 2018-12-13 DIAGNOSIS — F0150 Vascular dementia without behavioral disturbance: Secondary | ICD-10-CM | POA: Diagnosis not present

## 2018-12-14 DIAGNOSIS — I5022 Chronic systolic (congestive) heart failure: Secondary | ICD-10-CM | POA: Diagnosis not present

## 2018-12-14 DIAGNOSIS — F0150 Vascular dementia without behavioral disturbance: Secondary | ICD-10-CM | POA: Diagnosis not present

## 2018-12-14 DIAGNOSIS — R1312 Dysphagia, oropharyngeal phase: Secondary | ICD-10-CM | POA: Diagnosis not present

## 2018-12-14 DIAGNOSIS — E876 Hypokalemia: Secondary | ICD-10-CM | POA: Diagnosis not present

## 2018-12-14 DIAGNOSIS — E1142 Type 2 diabetes mellitus with diabetic polyneuropathy: Secondary | ICD-10-CM | POA: Diagnosis not present

## 2018-12-14 DIAGNOSIS — Z8673 Personal history of transient ischemic attack (TIA), and cerebral infarction without residual deficits: Secondary | ICD-10-CM | POA: Diagnosis not present

## 2018-12-14 DIAGNOSIS — Z20828 Contact with and (suspected) exposure to other viral communicable diseases: Secondary | ICD-10-CM | POA: Diagnosis not present

## 2018-12-14 DIAGNOSIS — E785 Hyperlipidemia, unspecified: Secondary | ICD-10-CM | POA: Diagnosis not present

## 2018-12-14 DIAGNOSIS — R41841 Cognitive communication deficit: Secondary | ICD-10-CM | POA: Diagnosis not present

## 2018-12-14 DIAGNOSIS — M6281 Muscle weakness (generalized): Secondary | ICD-10-CM | POA: Diagnosis not present

## 2018-12-23 DIAGNOSIS — F0390 Unspecified dementia without behavioral disturbance: Secondary | ICD-10-CM | POA: Diagnosis not present

## 2018-12-23 DIAGNOSIS — I4891 Unspecified atrial fibrillation: Secondary | ICD-10-CM | POA: Diagnosis not present

## 2018-12-23 DIAGNOSIS — E119 Type 2 diabetes mellitus without complications: Secondary | ICD-10-CM | POA: Diagnosis not present

## 2018-12-23 DIAGNOSIS — I10 Essential (primary) hypertension: Secondary | ICD-10-CM | POA: Diagnosis not present

## 2019-01-15 DIAGNOSIS — I739 Peripheral vascular disease, unspecified: Secondary | ICD-10-CM | POA: Diagnosis not present

## 2019-01-15 DIAGNOSIS — M79675 Pain in left toe(s): Secondary | ICD-10-CM | POA: Diagnosis not present

## 2019-01-15 DIAGNOSIS — B351 Tinea unguium: Secondary | ICD-10-CM | POA: Diagnosis not present

## 2019-01-15 DIAGNOSIS — M79674 Pain in right toe(s): Secondary | ICD-10-CM | POA: Diagnosis not present

## 2019-01-15 DIAGNOSIS — L853 Xerosis cutis: Secondary | ICD-10-CM | POA: Diagnosis not present

## 2019-01-28 DIAGNOSIS — Z950 Presence of cardiac pacemaker: Secondary | ICD-10-CM | POA: Diagnosis not present

## 2019-01-28 DIAGNOSIS — Z45018 Encounter for adjustment and management of other part of cardiac pacemaker: Secondary | ICD-10-CM | POA: Diagnosis not present

## 2019-01-28 DIAGNOSIS — I4430 Unspecified atrioventricular block: Secondary | ICD-10-CM | POA: Diagnosis not present

## 2019-02-04 DIAGNOSIS — Z20828 Contact with and (suspected) exposure to other viral communicable diseases: Secondary | ICD-10-CM | POA: Diagnosis not present

## 2019-02-11 DIAGNOSIS — Z20828 Contact with and (suspected) exposure to other viral communicable diseases: Secondary | ICD-10-CM | POA: Diagnosis not present

## 2019-02-18 DIAGNOSIS — Z1159 Encounter for screening for other viral diseases: Secondary | ICD-10-CM | POA: Diagnosis not present

## 2019-02-19 DIAGNOSIS — H4010X0 Unspecified open-angle glaucoma, stage unspecified: Secondary | ICD-10-CM | POA: Diagnosis not present

## 2019-02-19 DIAGNOSIS — E1142 Type 2 diabetes mellitus with diabetic polyneuropathy: Secondary | ICD-10-CM | POA: Diagnosis not present

## 2019-02-19 DIAGNOSIS — I482 Chronic atrial fibrillation, unspecified: Secondary | ICD-10-CM | POA: Diagnosis not present

## 2019-02-19 DIAGNOSIS — R1312 Dysphagia, oropharyngeal phase: Secondary | ICD-10-CM | POA: Diagnosis not present

## 2019-02-19 DIAGNOSIS — Z950 Presence of cardiac pacemaker: Secondary | ICD-10-CM | POA: Diagnosis not present

## 2019-02-19 DIAGNOSIS — M6281 Muscle weakness (generalized): Secondary | ICD-10-CM | POA: Diagnosis not present

## 2019-02-19 DIAGNOSIS — I5022 Chronic systolic (congestive) heart failure: Secondary | ICD-10-CM | POA: Diagnosis not present

## 2019-02-19 DIAGNOSIS — F0150 Vascular dementia without behavioral disturbance: Secondary | ICD-10-CM | POA: Diagnosis not present

## 2019-02-19 DIAGNOSIS — E785 Hyperlipidemia, unspecified: Secondary | ICD-10-CM | POA: Diagnosis not present

## 2019-02-21 DIAGNOSIS — F0390 Unspecified dementia without behavioral disturbance: Secondary | ICD-10-CM | POA: Diagnosis not present

## 2019-02-21 DIAGNOSIS — Z7901 Long term (current) use of anticoagulants: Secondary | ICD-10-CM | POA: Diagnosis not present

## 2019-02-21 DIAGNOSIS — I10 Essential (primary) hypertension: Secondary | ICD-10-CM | POA: Diagnosis not present

## 2019-02-21 DIAGNOSIS — E119 Type 2 diabetes mellitus without complications: Secondary | ICD-10-CM | POA: Diagnosis not present

## 2019-02-21 DIAGNOSIS — I4891 Unspecified atrial fibrillation: Secondary | ICD-10-CM | POA: Diagnosis not present

## 2019-02-21 DIAGNOSIS — K5900 Constipation, unspecified: Secondary | ICD-10-CM | POA: Diagnosis not present

## 2019-02-23 DIAGNOSIS — E119 Type 2 diabetes mellitus without complications: Secondary | ICD-10-CM | POA: Diagnosis not present

## 2019-02-23 DIAGNOSIS — Z79899 Other long term (current) drug therapy: Secondary | ICD-10-CM | POA: Diagnosis not present

## 2019-02-23 DIAGNOSIS — E0800 Diabetes mellitus due to underlying condition with hyperosmolarity without nonketotic hyperglycemic-hyperosmolar coma (NKHHC): Secondary | ICD-10-CM | POA: Diagnosis not present

## 2019-02-23 DIAGNOSIS — D649 Anemia, unspecified: Secondary | ICD-10-CM | POA: Diagnosis not present

## 2019-02-25 DIAGNOSIS — Z1159 Encounter for screening for other viral diseases: Secondary | ICD-10-CM | POA: Diagnosis not present

## 2019-03-19 DIAGNOSIS — Z1159 Encounter for screening for other viral diseases: Secondary | ICD-10-CM | POA: Diagnosis not present

## 2019-03-25 DIAGNOSIS — Z1159 Encounter for screening for other viral diseases: Secondary | ICD-10-CM | POA: Diagnosis not present

## 2019-04-01 DIAGNOSIS — Z1159 Encounter for screening for other viral diseases: Secondary | ICD-10-CM | POA: Diagnosis not present

## 2019-04-04 DIAGNOSIS — R319 Hematuria, unspecified: Secondary | ICD-10-CM | POA: Diagnosis not present

## 2019-04-04 DIAGNOSIS — N390 Urinary tract infection, site not specified: Secondary | ICD-10-CM | POA: Diagnosis not present

## 2019-04-08 DIAGNOSIS — Z1159 Encounter for screening for other viral diseases: Secondary | ICD-10-CM | POA: Diagnosis not present

## 2019-04-09 ENCOUNTER — Other Ambulatory Visit: Payer: Self-pay

## 2019-04-09 ENCOUNTER — Emergency Department: Payer: Medicare HMO

## 2019-04-09 ENCOUNTER — Inpatient Hospital Stay
Admission: EM | Admit: 2019-04-09 | Discharge: 2019-05-07 | DRG: 177 | Disposition: E | Payer: Medicare HMO | Attending: Family Medicine | Admitting: Family Medicine

## 2019-04-09 DIAGNOSIS — J1282 Pneumonia due to coronavirus disease 2019: Secondary | ICD-10-CM | POA: Diagnosis present

## 2019-04-09 DIAGNOSIS — F0390 Unspecified dementia without behavioral disturbance: Secondary | ICD-10-CM | POA: Diagnosis not present

## 2019-04-09 DIAGNOSIS — E785 Hyperlipidemia, unspecified: Secondary | ICD-10-CM | POA: Diagnosis present

## 2019-04-09 DIAGNOSIS — E860 Dehydration: Secondary | ICD-10-CM | POA: Diagnosis present

## 2019-04-09 DIAGNOSIS — E1149 Type 2 diabetes mellitus with other diabetic neurological complication: Secondary | ICD-10-CM | POA: Diagnosis present

## 2019-04-09 DIAGNOSIS — R6510 Systemic inflammatory response syndrome (SIRS) of non-infectious origin without acute organ dysfunction: Secondary | ICD-10-CM | POA: Diagnosis present

## 2019-04-09 DIAGNOSIS — U071 COVID-19: Secondary | ICD-10-CM | POA: Diagnosis not present

## 2019-04-09 DIAGNOSIS — T68XXXA Hypothermia, initial encounter: Secondary | ICD-10-CM | POA: Diagnosis not present

## 2019-04-09 DIAGNOSIS — I959 Hypotension, unspecified: Secondary | ICD-10-CM | POA: Diagnosis present

## 2019-04-09 DIAGNOSIS — Z833 Family history of diabetes mellitus: Secondary | ICD-10-CM

## 2019-04-09 DIAGNOSIS — R0689 Other abnormalities of breathing: Secondary | ICD-10-CM | POA: Diagnosis not present

## 2019-04-09 DIAGNOSIS — I714 Abdominal aortic aneurysm, without rupture: Secondary | ICD-10-CM | POA: Diagnosis present

## 2019-04-09 DIAGNOSIS — J1289 Other viral pneumonia: Secondary | ICD-10-CM | POA: Diagnosis not present

## 2019-04-09 DIAGNOSIS — L89322 Pressure ulcer of left buttock, stage 2: Secondary | ICD-10-CM | POA: Diagnosis present

## 2019-04-09 DIAGNOSIS — R451 Restlessness and agitation: Secondary | ICD-10-CM | POA: Diagnosis not present

## 2019-04-09 DIAGNOSIS — N390 Urinary tract infection, site not specified: Secondary | ICD-10-CM | POA: Diagnosis present

## 2019-04-09 DIAGNOSIS — J9601 Acute respiratory failure with hypoxia: Secondary | ICD-10-CM | POA: Diagnosis not present

## 2019-04-09 DIAGNOSIS — Z823 Family history of stroke: Secondary | ICD-10-CM

## 2019-04-09 DIAGNOSIS — I110 Hypertensive heart disease with heart failure: Secondary | ICD-10-CM | POA: Diagnosis present

## 2019-04-09 DIAGNOSIS — J189 Pneumonia, unspecified organism: Secondary | ICD-10-CM | POA: Diagnosis not present

## 2019-04-09 DIAGNOSIS — I69319 Unspecified symptoms and signs involving cognitive functions following cerebral infarction: Secondary | ICD-10-CM

## 2019-04-09 DIAGNOSIS — Z515 Encounter for palliative care: Secondary | ICD-10-CM | POA: Diagnosis not present

## 2019-04-09 DIAGNOSIS — Z6822 Body mass index (BMI) 22.0-22.9, adult: Secondary | ICD-10-CM | POA: Diagnosis not present

## 2019-04-09 DIAGNOSIS — I442 Atrioventricular block, complete: Secondary | ICD-10-CM | POA: Diagnosis present

## 2019-04-09 DIAGNOSIS — E870 Hyperosmolality and hypernatremia: Secondary | ICD-10-CM | POA: Diagnosis present

## 2019-04-09 DIAGNOSIS — E43 Unspecified severe protein-calorie malnutrition: Secondary | ICD-10-CM | POA: Diagnosis present

## 2019-04-09 DIAGNOSIS — L8990 Pressure ulcer of unspecified site, unspecified stage: Secondary | ICD-10-CM | POA: Diagnosis not present

## 2019-04-09 DIAGNOSIS — R54 Age-related physical debility: Secondary | ICD-10-CM | POA: Diagnosis present

## 2019-04-09 DIAGNOSIS — I4891 Unspecified atrial fibrillation: Secondary | ICD-10-CM | POA: Diagnosis not present

## 2019-04-09 DIAGNOSIS — Z87891 Personal history of nicotine dependence: Secondary | ICD-10-CM

## 2019-04-09 DIAGNOSIS — R0902 Hypoxemia: Secondary | ICD-10-CM | POA: Diagnosis not present

## 2019-04-09 DIAGNOSIS — N289 Disorder of kidney and ureter, unspecified: Secondary | ICD-10-CM | POA: Diagnosis not present

## 2019-04-09 DIAGNOSIS — R52 Pain, unspecified: Secondary | ICD-10-CM | POA: Diagnosis not present

## 2019-04-09 DIAGNOSIS — N179 Acute kidney failure, unspecified: Secondary | ICD-10-CM | POA: Diagnosis not present

## 2019-04-09 DIAGNOSIS — I5022 Chronic systolic (congestive) heart failure: Secondary | ICD-10-CM | POA: Diagnosis not present

## 2019-04-09 DIAGNOSIS — I480 Paroxysmal atrial fibrillation: Secondary | ICD-10-CM | POA: Diagnosis present

## 2019-04-09 DIAGNOSIS — R680 Hypothermia, not associated with low environmental temperature: Secondary | ICD-10-CM | POA: Diagnosis present

## 2019-04-09 DIAGNOSIS — Z96651 Presence of right artificial knee joint: Secondary | ICD-10-CM | POA: Diagnosis present

## 2019-04-09 DIAGNOSIS — R918 Other nonspecific abnormal finding of lung field: Secondary | ICD-10-CM | POA: Diagnosis not present

## 2019-04-09 DIAGNOSIS — R404 Transient alteration of awareness: Secondary | ICD-10-CM | POA: Diagnosis not present

## 2019-04-09 DIAGNOSIS — N200 Calculus of kidney: Secondary | ICD-10-CM | POA: Diagnosis not present

## 2019-04-09 DIAGNOSIS — J069 Acute upper respiratory infection, unspecified: Secondary | ICD-10-CM | POA: Diagnosis not present

## 2019-04-09 DIAGNOSIS — Z981 Arthrodesis status: Secondary | ICD-10-CM

## 2019-04-09 DIAGNOSIS — Z79899 Other long term (current) drug therapy: Secondary | ICD-10-CM

## 2019-04-09 DIAGNOSIS — R64 Cachexia: Secondary | ICD-10-CM | POA: Diagnosis present

## 2019-04-09 DIAGNOSIS — F0150 Vascular dementia without behavioral disturbance: Secondary | ICD-10-CM | POA: Diagnosis not present

## 2019-04-09 DIAGNOSIS — J9600 Acute respiratory failure, unspecified whether with hypoxia or hypercapnia: Secondary | ICD-10-CM | POA: Diagnosis not present

## 2019-04-09 DIAGNOSIS — R4182 Altered mental status, unspecified: Secondary | ICD-10-CM | POA: Diagnosis not present

## 2019-04-09 DIAGNOSIS — G9341 Metabolic encephalopathy: Secondary | ICD-10-CM | POA: Diagnosis not present

## 2019-04-09 DIAGNOSIS — E119 Type 2 diabetes mellitus without complications: Secondary | ICD-10-CM | POA: Diagnosis not present

## 2019-04-09 DIAGNOSIS — E1142 Type 2 diabetes mellitus with diabetic polyneuropathy: Secondary | ICD-10-CM | POA: Diagnosis not present

## 2019-04-09 DIAGNOSIS — R627 Adult failure to thrive: Secondary | ICD-10-CM | POA: Diagnosis not present

## 2019-04-09 DIAGNOSIS — Z66 Do not resuscitate: Secondary | ICD-10-CM | POA: Diagnosis present

## 2019-04-09 DIAGNOSIS — Z950 Presence of cardiac pacemaker: Secondary | ICD-10-CM

## 2019-04-09 DIAGNOSIS — G9340 Encephalopathy, unspecified: Secondary | ICD-10-CM | POA: Diagnosis not present

## 2019-04-09 DIAGNOSIS — Z7901 Long term (current) use of anticoagulants: Secondary | ICD-10-CM

## 2019-04-09 LAB — URINALYSIS, COMPLETE (UACMP) WITH MICROSCOPIC
Bilirubin Urine: NEGATIVE
Glucose, UA: NEGATIVE mg/dL
Ketones, ur: NEGATIVE mg/dL
Nitrite: NEGATIVE
Protein, ur: NEGATIVE mg/dL
Specific Gravity, Urine: 1.019 (ref 1.005–1.030)
pH: 5 (ref 5.0–8.0)

## 2019-04-09 LAB — CBC WITH DIFFERENTIAL/PLATELET
Abs Immature Granulocytes: 0.03 10*3/uL (ref 0.00–0.07)
Basophils Absolute: 0 10*3/uL (ref 0.0–0.1)
Basophils Relative: 0 %
Eosinophils Absolute: 0 10*3/uL (ref 0.0–0.5)
Eosinophils Relative: 0 %
HCT: 53.2 % — ABNORMAL HIGH (ref 36.0–46.0)
Hemoglobin: 16.4 g/dL — ABNORMAL HIGH (ref 12.0–15.0)
Immature Granulocytes: 1 %
Lymphocytes Relative: 10 %
Lymphs Abs: 0.4 10*3/uL — ABNORMAL LOW (ref 0.7–4.0)
MCH: 27 pg (ref 26.0–34.0)
MCHC: 30.8 g/dL (ref 30.0–36.0)
MCV: 87.6 fL (ref 80.0–100.0)
Monocytes Absolute: 0.1 10*3/uL (ref 0.1–1.0)
Monocytes Relative: 3 %
Neutro Abs: 3.9 10*3/uL (ref 1.7–7.7)
Neutrophils Relative %: 86 %
Platelets: 161 10*3/uL (ref 150–400)
RBC: 6.07 MIL/uL — ABNORMAL HIGH (ref 3.87–5.11)
RDW: 17 % — ABNORMAL HIGH (ref 11.5–15.5)
WBC: 4.5 10*3/uL (ref 4.0–10.5)
nRBC: 0 % (ref 0.0–0.2)

## 2019-04-09 LAB — COMPREHENSIVE METABOLIC PANEL
ALT: 11 U/L (ref 0–44)
AST: 20 U/L (ref 15–41)
Albumin: 2.9 g/dL — ABNORMAL LOW (ref 3.5–5.0)
Alkaline Phosphatase: 59 U/L (ref 38–126)
Anion gap: 19 — ABNORMAL HIGH (ref 5–15)
BUN: 98 mg/dL — ABNORMAL HIGH (ref 8–23)
CO2: 18 mmol/L — ABNORMAL LOW (ref 22–32)
Calcium: 9.2 mg/dL (ref 8.9–10.3)
Chloride: 123 mmol/L — ABNORMAL HIGH (ref 98–111)
Creatinine, Ser: 3.05 mg/dL — ABNORMAL HIGH (ref 0.44–1.00)
GFR calc Af Amer: 15 mL/min — ABNORMAL LOW (ref >60)
GFR calc non Af Amer: 13 mL/min — ABNORMAL LOW (ref >60)
Glucose, Bld: 116 mg/dL — ABNORMAL HIGH (ref 70–99)
Potassium: 4.1 mmol/L (ref 3.5–5.1)
Sodium: 160 mmol/L — ABNORMAL HIGH (ref 135–145)
Total Bilirubin: 0.8 mg/dL (ref 0.3–1.2)
Total Protein: 7 g/dL (ref 6.5–8.1)

## 2019-04-09 LAB — PROTIME-INR
INR: 1.3 — ABNORMAL HIGH (ref 0.8–1.2)
Prothrombin Time: 15.6 seconds — ABNORMAL HIGH (ref 11.4–15.2)

## 2019-04-09 LAB — LACTIC ACID, PLASMA
Lactic Acid, Venous: 2.3 mmol/L (ref 0.5–1.9)
Lactic Acid, Venous: 2.3 mmol/L (ref 0.5–1.9)

## 2019-04-09 LAB — TROPONIN I (HIGH SENSITIVITY): Troponin I (High Sensitivity): 47 ng/L — ABNORMAL HIGH (ref <18)

## 2019-04-09 LAB — HEPARIN LEVEL (UNFRACTIONATED): Heparin Unfractionated: 0.65 IU/mL (ref 0.30–0.70)

## 2019-04-09 LAB — APTT: aPTT: 24 seconds (ref 24–36)

## 2019-04-09 MED ORDER — SODIUM CHLORIDE 0.9 % IV BOLUS
1000.0000 mL | Freq: Once | INTRAVENOUS | Status: AC
  Administered 2019-04-09: 18:00:00 1000 mL via INTRAVENOUS

## 2019-04-09 MED ORDER — HEPARIN (PORCINE) 25000 UT/250ML-% IV SOLN
600.0000 [IU]/h | INTRAVENOUS | Status: DC
  Administered 2019-04-09: 600 [IU]/h via INTRAVENOUS
  Filled 2019-04-09: qty 250

## 2019-04-09 MED ORDER — ACETAMINOPHEN 325 MG PO TABS: 650.0000 mg | ORAL_TABLET | Freq: Four times a day (QID) | ORAL | Status: DC | PRN

## 2019-04-09 MED ORDER — ACETAMINOPHEN 650 MG RE SUPP
650.0000 mg | Freq: Four times a day (QID) | RECTAL | Status: DC | PRN
  Administered 2019-04-10: 650 mg via RECTAL
  Filled 2019-04-09: qty 1

## 2019-04-09 MED ORDER — ONDANSETRON HCL 4 MG/2ML IJ SOLN: 4.0000 mg | Freq: Four times a day (QID) | INTRAMUSCULAR | Status: DC | PRN

## 2019-04-09 MED ORDER — LATANOPROST 0.005 % OP SOLN
1.0000 [drp] | Freq: Every day | OPHTHALMIC | Status: DC
  Filled 2019-04-09: qty 2.5

## 2019-04-09 MED ORDER — SODIUM CHLORIDE 0.9 % IV SOLN
500.0000 mg | INTRAVENOUS | Status: DC
  Administered 2019-04-09: 500 mg via INTRAVENOUS
  Filled 2019-04-09: qty 500

## 2019-04-09 MED ORDER — HYDRALAZINE HCL 20 MG/ML IJ SOLN: 10.0000 mg | INTRAMUSCULAR | Status: DC | PRN

## 2019-04-09 MED ORDER — ONDANSETRON HCL 4 MG PO TABS: 4.0000 mg | ORAL_TABLET | Freq: Four times a day (QID) | ORAL | Status: DC | PRN

## 2019-04-09 MED ORDER — DEXTROSE 5 % IV SOLN
INTRAVENOUS | Status: DC
  Administered 2019-04-10: 02:00:00 via INTRAVENOUS

## 2019-04-09 MED ORDER — SODIUM CHLORIDE 0.9 % IV BOLUS
1000.0000 mL | Freq: Once | INTRAVENOUS | Status: AC
  Administered 2019-04-09: 1000 mL via INTRAVENOUS

## 2019-04-09 MED ORDER — SODIUM CHLORIDE 0.9 % IV SOLN
2.0000 g | INTRAVENOUS | Status: DC
  Administered 2019-04-09: 21:00:00 2 g via INTRAVENOUS
  Filled 2019-04-09: qty 20

## 2019-04-09 NOTE — ED Provider Notes (Addendum)
[facility] Emergency Department Provider Note ____________________________________________   First MD Initiated Contact with Patient 02-09-2019 1725     (approximate)  I have reviewed the triage vital signs and the nursing notes.   HISTORY  Chief Complaint Altered Mental Status  Level 5 caveat: History present illness limited due to altered mental status   HPI Tamara Campbell is a 85 y.o. female with PMH as noted below [facility] with altered mental status since last night.  The patient was noted to be hypotensive by EMS, and she was given a fluid bolus.  The patient is unable to give any history.  Past Medical History:  Diagnosis Date  . Aortic valve sclerosis   . Bradycardia   . Cardiac pacemaker in situ    St. Jude single chamber pacemaker model 1210 SN# [id number] with St Jude 1888tc RV SN# [id number] both implanted on 04/26/2011  . CHF (congestive heart failure) (HCC)   . CVA (cerebral vascular accident) (HCC)    Right sided weakness  . Diabetes mellitus without complication (HCC)   . Dyspnea, unspecified   . Heart block    intermittent 3rd degree heart block with ventricular escape rate of approximatley 38bpm  . HFrEF (heart failure with reduced ejection fraction) (HCC)   . History of chicken pox   . History of echocardiogram 03/07/2011   Normal LV size/function. Moderate aortic root dilation, Moderate MR. Severe TR. PASP 65mmHg.  Marland Kitchen. Hyperlipidemia, unspecified   . Hypertension   . Osteoarthritis   . Pacemaker   . Paroxysmal atrial fibrillation (HCC)    Documented in August 2016 at the time of CVA  . Stroke [facility](HCC) 2005    Patient Active Problem List   Diagnosis Date Noted  . SIRS (systemic inflammatory response syndrome) (HCC) 09-05-202020  . Controlled type 2 diabetes mellitus with neurologic complication, without long-term current use of insulin (HCC) 05/10/2018  . Dyslipidemia associated with type 2 diabetes mellitus  (HCC) 12/25/2017  . Vascular dementia without behavioral disturbance (HCC) 11/29/2017  . Residual stage of open-angle glaucoma, bilateral 03/02/2017  . Rheumatic disorders of both mitral and tricuspid valves 03/02/2017  . Hypertensive heart disease with heart failure (HCC) 03/02/2017  . Chronic systolic (congestive) heart failure (HCC) 03/02/2017  . Presence of cardiac pacemaker 03/02/2017  . Vitamin D deficiency 03/02/2017  . Generalized weakness 12/29/2016  . Risk for falls 03/16/2015  . Diabetes mellitus with polyneuropathy (HCC) 01/13/2015  . Hypokalemia 01/13/2015  . Cerebral infarction due to embolism of middle cerebral artery (HCC)   . Paroxysmal atrial fibrillation (HCC) 01/10/2015  . Cardiomyopathy (HCC) 01/10/2015    Past Surgical History:  Procedure Laterality Date  . ABDOMINAL HYSTERECTOMY    . INSERT REPLACE REMOVE PACEMAKER    . JOINT REPLACEMENT Bilateral    hips  . LUMBAR FUSION  1996  . PACEMAKER PLACEMENT Left    DUAL CHAMBER PACEMAKER GENERATOR  . TOTAL KNEE ARTHROPLASTY Right 08/10/2012   Procedure: RIGHT TOTAL KNEE ARTHROPLASTY; Surgeon: Christell FaithPaul Francis Lachiewicz, MD; [facility]; Service: Orthopedics; Laterality: Right;    Prior to Admission medications   Medication Sig Start Date End Date Taking? Authorizing Provider  acetaminophen (TYLENOL) 500 MG tablet Take 500 mg by mouth every 4 (four) hours as needed for mild pain or fever.    [provider]  amLODipine-benazepril (LOTREL) 10-20 MG capsule Take 1 capsule by mouth daily.     [provider]  apixaban (ELIQUIS) 2.5 MG TABS tablet  Take 1 tablet (2.5 mg total) by mouth 2 (two) times daily. 12/30/16   Dustin Flock, MD  bimatoprost (LUMIGAN) 0.01 % SOLN Place 1 drop into both eyes at bedtime.     [provider]  carvedilol (COREG) 6.25 MG tablet Take 1 tablet (6.25 mg total) by mouth 2 (two) times daily with a meal. 07/03/18   Nyoka Cowden, Phylis Bougie, NP  hydrochlorothiazide  (HYDRODIURIL) 25 MG tablet Take 25 mg by mouth daily.     [provider]  Infant Care Products [facility] EX) Apply liberal amount topically to area of skin irritation prn. OK to leave at bedside    [provider]  magnesium hydroxide (MILK OF MAGNESIA) 400 MG/5ML suspension Take 30 mLs by mouth every 4 (four) hours as needed for mild constipation. For constipation/ no BM for 2 days    [provider]  Multiple Vitamin (MULTIVITAMIN WITH MINERALS) TABS tablet Take 1 tablet by mouth daily.    [provider]  potassium chloride (K-DUR,KLOR-CON) 10 MEQ tablet Take 10 mEq by mouth daily.    [provider]  Skin Protectants, Misc. (NO-STING SKIN-PREP EX) Apply 1 application to both heels topically two times daily 11/27/17   [provider]  UNABLE TO FIND Diet: NCS, NAS    [provider]  Wound Dressings (ALLEVYN ADHESIVE EX) Apply topically. Apply to right great toe joint / bunion every week and as needed for protection    [provider]    Allergies Patient has no known allergies.  Family History  Problem Relation Age of Onset  . Cervical cancer Mother   . Stroke Sister   . Diabetes type II Brother     Social History Social History   Tobacco Use  . Smoking status: Former Research scientist (life sciences)  . Smokeless tobacco: Never Used  Substance Use Topics  . Alcohol use: No  . Drug use: No    Review of Systems Level 5 caveat: History of present illness limited due to altered mental status    ____________________________________________   PHYSICAL EXAM:  VITAL SIGNS: ED Triage Vitals  Enc Vitals Group     BP 05/02/19 1720 119/88     Pulse Rate 05/02/2019 1720 73     Resp May 02, 2019 1720 (!) 22     Temp 05/02/19 1815 (!) 96.1 F (35.6 C)     Temp Source 05-02-19 1815 Rectal     SpO2 05/02/2019 1720 96 %     Weight 02-May-2019 1721 113 lb 12.1 oz (51.6 kg)     Height May 02, 2019 1721 5' (1.524 m)     Head Circumference --       [facility] Flow --      Pain Score --      Pain Loc --      Pain Edu? --      Excl. in [place]? --     Constitutional: Somnolent, moving to painful stimuli. Eyes: Conjunctivae are normal.  EOMI.  PERRLA. Head: Atraumatic. Nose: No congestion/rhinnorhea. Mouth/Throat: Mucous membranes are dry.   Neck: Normal range of motion.  Cardiovascular: Normal rate, regular rhythm.  Good peripheral circulation. Respiratory: Normal respiratory effort.  No retractions.  Gastrointestinal: Soft and nontender. No distention.  Genitourinary: No flank tenderness. Musculoskeletal: No lower extremity edema.  Extremities warm and well perfused.  Neurologic: Motor intact in all extremities. Skin:  Skin is warm and dry. No rash noted. Psychiatric: Unable to assess.  ____________________________________________   LABS (all labs ordered are listed, but only  abnormal results are displayed)  Labs Reviewed  LACTIC ACID, PLASMA - Abnormal; Notable for the following components:      Result Value   Lactic Acid, Venous 2.3 (*)    All other components within normal limits  CBC WITH DIFFERENTIAL/PLATELET - Abnormal; Notable for the following components:   RBC 6.07 (*)    Hemoglobin 16.4 (*)    HCT 53.2 (*)    RDW 17.0 (*)    Lymphs Abs 0.4 (*)    All other components within normal limits  URINALYSIS, COMPLETE (UACMP) WITH MICROSCOPIC - Abnormal; Notable for the following components:   Color, Urine AMBER (*)    APPearance CLOUDY (*)    Hgb urine dipstick SMALL (*)    Leukocytes,Ua TRACE (*)    Bacteria, UA MANY (*)    Non Squamous Epithelial PRESENT (*)    All other components within normal limits  COMPREHENSIVE METABOLIC PANEL - Abnormal; Notable for the following components:   Sodium 160 (*)    Chloride 123 (*)    CO2 18 (*)    Glucose, Bld 116 (*)    BUN 98 (*)    Creatinine, Ser 3.05 (*)    Albumin 2.9 (*)    GFR calc non Af Amer 13 (*)    GFR calc Af Amer 15 (*)    Anion gap 19 (*)    All other  components within normal limits  CULTURE, BLOOD (ROUTINE X 2)  CULTURE, BLOOD (ROUTINE X 2)  SARS CORONAVIRUS 2 (TAT 6-24 HRS)  LACTIC ACID, PLASMA  TROPONIN I (HIGH SENSITIVITY)  TROPONIN I (HIGH SENSITIVITY)   ____________________________________________  EKG  ED ECG REPORT I, Dionne Bucy, the attending physician, personally viewed and interpreted this ECG.  Date: 04/18/2019 EKG Time: 1750 Rate: 73 Rhythm: normal sinus rhythm QRS Axis: normal Intervals: Nonspecific IVCD, prolonged QTc ST/T Wave abnormalities: LVH with repolarization abnormality Narrative Interpretation: Nonspecific abnormalities with no evidence of acute ischemia; no significant change when compared to EKG of 08/01/2016  ____________________________________________  RADIOLOGY  CXR: Bilateral lower lobe opacities concerning for pneumonia CT head: Pending  ____________________________________________   PROCEDURES  Procedure(s) performed: No  Procedures  Critical Care performed: No ____________________________________________   INITIAL IMPRESSION / ASSESSMENT AND PLAN / ED COURSE  Pertinent labs & imaging results that were available during my care of the patient were reviewed by me and considered in my medical decision making (see chart for details).  85 year old female with PMH as noted above including history of CVA and vascular dementia presents with altered mental status since last night, and was noted to be hypotensive by EMS.Apparently, the patient is being treated for a UTI as well.  I reviewed the past medical records in Epic.  I reviewed the past medical records in epic.  The patient was seen in the ED about a month ago after an episode of unresponsiveness although she returned to her baseline mental status after that time.  She had some work-up in the ED which was unremarkable, and was discharged back to her facility.  On exam today, the patient is minimally responsive, moving her  extremities to painful stimuli but lying with her eyes closed and not following any commands.  Her mucous membranes appear dry.  There is no visible trauma.  The remainder of the exam is as described above.  Differential is broad but includes infection/sepsis, dehydration or other electrolyte/metabolic abnormality, or less likely CNS cause.  We will obtain lab work-up, CT head, chest x-ray and  UA, and assess.  ----------------------------------------- 7:55 PM on 04/18/2019 -----------------------------------------  Lab work-up is significant for evidence of dehydration, sodium of 160, AKI with creatinine of 3, and a slightly elevated lactate.  Patient is receiving fluids.  Her blood pressure has dropped to the 80s again, but her other vital signs remained stable.  Chest x-ray shows findings concerning for bilateral lower lobe pneumonia.  I have ordered antibiotics.  I discussed the case with the hospitalist for admission.  ____________________________________________   FINAL CLINICAL IMPRESSION(S) / ED DIAGNOSES  Final diagnoses:  Hypernatremia  Dehydration  Acute renal insufficiency  Healthcare-associated pneumonia      NEW MEDICATIONS STARTED DURING THIS VISIT:  New Prescriptions   No medications on file     Note:  This document was prepared using Dragon voice recognition software and may include unintentional dictation errors.    Dionne Bucy, MD 04/27/2019 Eldred Manges    Dionne Bucy, MD 04/07/2019 2026

## 2019-04-09 NOTE — ED Notes (Signed)
Called lab to come and draw green top that was hemolized.  Per lab, they will send someone.

## 2019-04-09 NOTE — ED Notes (Signed)
Multiple sticks performed by phlebotomy to obtain ordered bloodwork. Unable to obtain sufficient blood that does not hemolyzed to perform ordered lab tests. EDP @ BS attempting to start [place] IV.

## 2019-04-09 NOTE — ED Notes (Addendum)
Unable to obtain second set of blood cultures--pt has been stuck X 3 by Ed staff and has 2 PIV on arrival by EMS

## 2019-04-09 NOTE — ED Notes (Signed)
Front desk sent family back, RN was not called or aware, family at bedside and updated.

## 2019-04-09 NOTE — H&P (Addendum)
History and Physical    ARIAM MOL XIP:[id number] DOB: 05-Sep-1930 DOA: 04/26/19  PCP: Lauro Regulus, MD  Patient coming from: Skilled nursing facility.  History obtained from patient and daughter ER physician as patient is confused.  Chief Complaint: Altered mental status.  HPI: Tamara Campbell is a 85 y.o. female with history of dementia, complete heart block status post pacemaker placement, proximal atrial fibrillation, CVA diabetes mellitus type 2 was brought to the ER after patient was found to be increasingly confused.  As per the patient's daughter it is reported that patient has not been eating well for last few days.  No definite reports of any nausea vomiting or diarrhea.  ED Course: In the ER patient was confused and hypotensive and hypothermic.  Chest x-ray shows possibility of infiltrates concerning for pneumonia.  CT head did not show anything acute.  Labs show worsening of renal function with creatinine of 5 with creatinine of 0.88 in January 2020.  Patient's lactic acid was 2.3 high-sensitivity troponin was 47 EKG was showing sinus rhythm with IVCD LVH.  WBC was 4.5 hemoglobin 16.4 platelets 161.  Sodium was 160.  CT renal study shows no definite obstruction but does show concerning for pneumonia.  COVID-19 test was pending.  Patient started on empiric antibiotics for pneumonia.  Also was on normal saline bolus for hypotension which improved.  Presently on D5W for hypernatremia.  Review of Systems: As per HPI, rest all negative.   Past Medical History:  Diagnosis Date   Aortic valve sclerosis    Bradycardia    Cardiac pacemaker in situ    St. Jude single chamber pacemaker model 1210 SN# [id number] with St Jude 1888tc RV SN# [id number] both implanted on 04/26/2011   CHF (congestive heart failure) (HCC)    CVA (cerebral vascular accident) (HCC)    Right sided weakness   Diabetes mellitus without complication (HCC)    Dyspnea, unspecified    Heart block    intermittent 3rd degree heart block with ventricular escape rate of approximatley 38bpm   HFrEF (heart failure with reduced ejection fraction) (HCC)    History of chicken pox    History of echocardiogram 03/07/2011   Normal LV size/function. Moderate aortic root dilation, Moderate MR. Severe TR. PASP .   Hyperlipidemia, unspecified    Hypertension    Osteoarthritis    Pacemaker    Paroxysmal atrial fibrillation (HCC)    Documented in August 2016 at the time of CVA   Stroke [facility]) 2005    Past Surgical History:  Procedure Laterality Date   ABDOMINAL HYSTERECTOMY     INSERT REPLACE REMOVE PACEMAKER     JOINT REPLACEMENT Bilateral    hips   LUMBAR FUSION  1996   PACEMAKER PLACEMENT Left    DUAL CHAMBER PACEMAKER GENERATOR   TOTAL KNEE ARTHROPLASTY Right 08/10/2012   Procedure: RIGHT TOTAL KNEE ARTHROPLASTY; Surgeon: Christell Faith, MD; Location: [facility] OR; Service: Orthopedics; Laterality: Right;     reports that she has quit smoking. She has never used smokeless tobacco. She reports that she does not drink alcohol or use drugs.  No Known Allergies  Family History  Problem Relation Age of Onset   Cervical cancer Mother    Stroke Sister    Diabetes type II Brother     Prior to Admission medications   Medication Sig Start Date End Date Taking? Authorizing Provider  amLODipine-benazepril (LOTREL) 10-20 MG capsule Take 1 capsule by mouth daily.    Yes  [provider]  apixaban (ELIQUIS) 2.5 MG TABS tablet Take 1 tablet (2.5 mg total) by mouth 2 (two) times daily. 12/30/16  Yes Auburn Bilberry, MD  bimatoprost (LUMIGAN) 0.01 % SOLN Place 1 drop into both eyes at bedtime.    Yes [provider]  carvedilol (COREG) 6.25 MG tablet Take 1 tablet (6.25 mg total) by mouth 2 (two) times daily with a meal. 07/03/18  Yes Sharee Holster, NP  hydrochlorothiazide (HYDRODIURIL) 25 MG tablet Take 25 mg by mouth daily.    Yes [provider]  Multiple Vitamin (MULTIVITAMIN WITH MINERALS) TABS tablet Take 1 tablet by mouth daily.   Yes [provider]  polyethylene glycol (MIRALAX / GLYCOLAX) 17 g packet Take 17 g by mouth daily.   Yes [provider]  potassium chloride (K-DUR,KLOR-CON) 10 MEQ tablet Take 10 mEq by mouth daily.   Yes [provider]  senna-docusate (SENOKOT-S) 8.6-50 MG tablet Take 1 tablet by mouth daily.   Yes [provider]    Physical Exam: Constitutional: Moderately built and nourished. Vitals:   04/19/2019 1930 04/14/2019 2030 04/08/2019 2119 04/12/2019 2145  BP: (!) 85/72  (!) 118/105 110/76  Pulse: 72 73 73 73  Resp: 18 (!) 22 (!) 21 18  Temp:      TempSrc:      SpO2: 100% 99% 99% 99%  Weight:      Height:       Eyes: Anicteric no pallor. ENMT: No discharge from the ears eyes nose or mouth. Neck: No mass felt.  No neck rigidity. Respiratory: No rhonchi or crepitations. Cardiovascular: S1-S2 heard. Abdomen: Soft nontender bowel sound present. Musculoskeletal: No edema. Skin: No rash. Neurologic: Patient is confused does not follow commands. Psychiatric: Confused.   Labs on Admission: I have personally reviewed following labs and imaging studies  CBC: Recent Labs  Lab 04/11/2019 1739  WBC 4.5  NEUTROABS 3.9  HGB 16.4*  HCT 53.2*  MCV 87.6  PLT 161   Basic Metabolic Panel: Recent Labs  Lab 04/11/2019 1840  NA 160*  K 4.1  CL 123*  CO2 18*  GLUCOSE 116*  BUN 98*  CREATININE 3.05*  CALCIUM 9.2   GFR: Estimated Creatinine Clearance: 9.5 mL/min (A) (by C-G formula based on SCr of 3.05 mg/dL (H)). Liver Function Tests: Recent Labs  Lab 04/20/2019 1840  AST 20  ALT 11  ALKPHOS 59  BILITOT 0.8  PROT 7.0  ALBUMIN 2.9*   No results for input(s): LIPASE, AMYLASE in the last 168 hours. No results for input(s): AMMONIA in the last 168 hours. Coagulation Profile: No results for input(s): INR, PROTIME in the last 168 hours. Cardiac Enzymes: No  results for input(s): CKTOTAL, CKMB, CKMBINDEX, TROPONINI in the last 168 hours. BNP (last 3 results) No results for input(s): PROBNP in the last 8760 hours. HbA1C: No results for input(s): HGBA1C in the last 72 hours. CBG: No results for input(s): GLUCAP in the last 168 hours. Lipid Profile: No results for input(s): CHOL, HDL, LDLCALC, TRIG, CHOLHDL, LDLDIRECT in the last 72 hours. Thyroid Function Tests: No results for input(s): TSH, T4TOTAL, FREET4, T3FREE, THYROIDAB in the last 72 hours. Anemia Panel: No results for input(s): VITAMINB12, FOLATE, FERRITIN, TIBC, IRON, RETICCTPCT in the last 72 hours. Urine analysis:    Component Value Date/Time   COLORURINE AMBER (A) 05/01/2019 1735   APPEARANCEUR CLOUDY (A) 05/02/2019 1735   LABSPEC 1.019 05/04/2019 1735   PHURINE 5.0 04/25/2019 1735   GLUCOSEU NEGATIVE  04/27/2019 1735   HGBUR SMALL (A) 04/08/2019 1735   BILIRUBINUR NEGATIVE 04/12/2019 1735   KETONESUR NEGATIVE 04/20/2019 1735   PROTEINUR NEGATIVE 05/01/2019 1735   UROBILINOGEN 0.2 01/11/2015 2338   NITRITE NEGATIVE 04/21/2019 1735   LEUKOCYTESUR TRACE (A) 05/05/2019 1735   Sepsis Labs: @LABRCNTIP (procalcitonin:4,lacticidven:4) )No results found for this or any previous visit (from the past 240 hour(s)).   Radiological Exams on Admission: Ct Head Wo Contrast  Result Date: 04/17/2019 CLINICAL DATA:  Altered mental status. EXAM: CT HEAD WITHOUT CONTRAST TECHNIQUE: Contiguous axial images were obtained from the base of the skull through the vertex without intravenous contrast. COMPARISON:  12/27/2016 FINDINGS: Brain: No acute large territory infarct, intracranial hemorrhage, mass, midline shift, or extra-axial fluid collection is identified. Chronic right frontoparietal, bilateral thalamic, and cerebellar infarcts are again seen. Confluent hypodensities in the cerebral white matter bilaterally are unchanged and nonspecific but compatible with severe chronic small vessel ischemic  disease. There is mild cerebral atrophy. Vascular: Calcified atherosclerosis at the skull base. No hyperdense vessel. Skull: No fracture or focal osseous lesion. Sinuses/Orbits: Visualized paranasal sinuses and mastoid air cells are clear. Orbits are unremarkable. Other: None. IMPRESSION: 1. No evidence of acute intracranial abnormality. 2. Severe chronic small vessel ischemic disease with multiple chronic infarcts. Electronically Signed   By: Logan Bores M.D.   On: 04/20/2019 20:36   Dg Chest Portable 1 View  Result Date: 04/16/2019 CLINICAL DATA:  Hypotension, possible sepsis EXAM: PORTABLE CHEST 1 VIEW COMPARISON:  03/10/2017 FINDINGS: Pacer remains in place, unchanged. Cardiomegaly, vascular congestion. Bilateral lower lobe airspace opacities are noted and could reflect pneumonia. No effusions or acute bony abnormality. IMPRESSION: Cardiomegaly, vascular congestion. Bilateral lower lobe opacities concerning for pneumonia. Electronically Signed   By: Rolm Baptise M.D.   On: 04/25/2019 19:17   Ct Renal Stone Study  Result Date: 04/21/2019 CLINICAL DATA:  Initial evaluation for acute altered mental status, flank pain. EXAM: CT ABDOMEN AND PELVIS WITHOUT CONTRAST TECHNIQUE: Multidetector CT imaging of the abdomen and pelvis was performed following the standard protocol without IV contrast. COMPARISON:  None available. FINDINGS: Lower chest: Patchy and consolidative ground-glass opacity seen within the left greater than right lung bases, concerning for possible pneumonia. Superimposed streaky atelectatic changes with bronchiectasis noted as well. Cardiomegaly with extensive 3 vessel coronary artery calcifications partially visualized. Left-sided pacemaker noted. Hepatobiliary: Multiple scattered hypodense lesions noted within the liver, largest of which measures 12 mm at the hepatic dome (series 2, image 10). Findings are indeterminate, and not well assessed on this noncontrast examination. Gallbladder  contracted and not well seen. No appreciable biliary dilatation period with Pancreas: Pancreas grossly within normal limits. Spleen: Spleen within normal limits. Adrenals/Urinary Tract: Adrenal glands within normal limits. Kidneys equal in size. Probable punctate nonobstructive nephrolithiasis noted at the lower pole of the left kidney, measuring no more than 2-3 mm in size (series 5, image 53). No other radiopaque calculi seen within either kidney. No appreciable radiopaque calculi seen along the course of either renal collecting system. Evaluation limited by streak artifact from spinal fusion hardware as well as bilateral hip arthroplasties. No visible hydroureter. Bladder poorly assessed due to streak artifact from hip arthroplasties. Stomach/Bowel: Stomach decompressed without abnormality. No evidence for bowel obstruction. Appendix not well visualized, however, no appreciable inflammatory changes seen within the right lower quadrant or about the cecum to suggest acute appendicitis. Scattered colonic diverticulosis without evidence for acute diverticulitis. No definite inflammatory changes seen about the bowels on this noncontrast examination. Vascular/Lymphatic: Aorta  is diffusely tortuous with advanced aorto bi-iliac atherosclerotic disease. Aneurysmal dilatation up to 3.1 cm at the level of the aortic hiatus. No appreciable adenopathy. Reproductive: Pelvic structures not well visualized due to streak artifact from hip arthroplasties. No obvious pelvic or adnexal mass. Other: No visible free air or fluid. Musculoskeletal: No acute osseous abnormality. No discrete lytic or blastic osseous lesions. Sequelae of prior PLIF noted at L4-5. Bilateral hip arthroplasties in place. Mild diffuse anasarca noted within the external soft tissues. IMPRESSION: 1. Patchy and consolidative ground-glass opacity within the left greater than right lung bases, concerning for possible pneumonia. 2. No other acute intra-abdominal or  pelvic process identified. 3. Punctate nonobstructive left nephrolithiasis. No evidence for obstructive uropathy. 4. Colonic diverticulosis without evidence for acute diverticulitis. 5. Advanced aorto bi-iliac atherosclerotic disease with aneurysmal dilatation up to 3.1 cm at the level of the aortic hiatus. Recommend followup by ultrasound in 3 years. This recommendation follows ACR consensus guidelines: White Paper of the ACR Incidental Findings Committee II on Vascular Findings. J Am Coll Radiol 2013; 10:789-794. Aortic aneurysm NOS (ICD10-I71.9) 6. Cardiomegaly with extensive 3 vessel coronary artery calcifications. Electronically Signed   By: Rise MuBenjamin  McClintock M.D.   On: 12-02-18 20:57    EKG: Independently reviewed.  Sinus rhythm with IVCD.  Assessment/Plan Principal Problem:   SIRS (systemic inflammatory response syndrome) (HCC) Active Problems:   Paroxysmal atrial fibrillation (HCC)   Chronic systolic (congestive) heart failure (HCC)   Vascular dementia without behavioral disturbance (HCC)   Controlled type 2 diabetes mellitus with neurologic complication, without long-term current use of insulin (HCC)   CAP (community acquired pneumonia)   Hypernatremia    1. SIRS with possible sepsis from pneumonia on empiric antibiotics COVID-19 test is pending.  Follow cultures continue hydration.  Check urine for Legionella strep antigen.  Could be from aspiration.  Will need swallow evaluation. 2. Acute renal failure could be from poor oral intake.  Also has hypertensive which could be contributing.  Urine shows amorphous crystal CT renal study does not show any obstruction.  Continue with hydration follow metabolic panel. 3. Acute encephalopathy with dementia likely because of renal failure hypernatremia and other metabolic reasons.  Closely follow metabolic panel IV fluids. 4. Hypernatremia could be from poor oral intake.  D5W for now.  Closely follow metabolic panel. 5. Hypertension  presently hypotensive hold antihypertensives and closely monitor will be on IV hydralazine. 6. History of A. fib -since patient is encephalopathic we will keep patient n.p.o. until we get swallow evaluation and keep patient on heparin and hold apixaban. 7. History of pacemaker placement for complete heart block. 8. History of diabetes mellitus type 2 presently not on medication.  We will keep patient on D5W and closely monitor CBGs. 9. Severe protein calorie malnutrition cachexia will need palliative care consult and nutrition input. 10. Abdominal aortic aneurysm will need follow-up as outpatient. 11. Hypothermia likely from sepsis.  But I will check TSH cortisol levels.  Given that patient has acute renal failure and possible sepsis will need inpatient status.  Addendum -patient remained hypotensive and encephalopathic and COVID-19 test came back positive.  Patient has also been having almost no output of urine.  After discussing with patient's daughter who is at the bedside patient's daughter decided to make patient only comfort measures stop all medications continue IV fluids and [facility] in [place].  Patient's daughter agreeable.    DVT prophylaxis: Heparin. Code Status: DNR  confirmed with patient's daughter. Family Communication: Patient's daughter. Disposition Plan: To be determined. Consults called: Palliative care. Admission status: Inpatient.   Eduard Clos MD [facility] Pager [phone].  If 7PM-7AM, please contact night-coverage [email or web address] Password [facility]  14-Apr-2019, 9:56 PM

## 2019-04-09 NOTE — ED Notes (Signed)
Tamara Campbell hugger applied for core body temp of 96.1

## 2019-04-09 NOTE — ED Triage Notes (Addendum)
Pt to ER via [facility] from [facility] where she resides. Staff reports AMS that started last night and worsened today. Pt pending COVID test. Pt responsive to physical stimuli with extension. EMS report low BP 81/57, given 500cc NS en route, pt wears 2 L  chronically. 96.6 axillary temp.  Pt currently being treated for UTI with Macrobid

## 2019-04-09 NOTE — ED Notes (Addendum)
Pt  Moved from ED bed to hospital stretcher. Per wick placed. Pt placed back on 3L of O2. Techs noted skin breakdown to pt right glute.  Pt positioned on left side. Techs placed barrier dressing on right glute.   pts daughter was upset about her breakdown and took pictures of her moms breakdown saying she would not be sending her back to the facility.   Lm edt

## 2019-04-09 NOTE — ED Notes (Signed)
Stuck X 2 for blood, unable to obtain

## 2019-04-09 NOTE — ED Notes (Signed)
ED Provider at bedside. 

## 2019-04-09 NOTE — ED Notes (Signed)
Patient transported to CT. Lab called for redraw of troponin and a lactic acid.

## 2019-04-09 NOTE — Progress Notes (Signed)
ANTICOAGULATION CONSULT NOTE - Initial Consult  Pharmacy Consult for Heparin  Indication: chest pain/ACS  No Known Allergies  Patient Measurements: Height: 5' (152.4 cm) Weight: 113 lb 12.1 oz (51.6 kg) IBW/kg (Calculated) : 45.5 Heparin Dosing Weight: 50.4 kg   Vital Signs: Temp: 96.1 F (35.6 C) (11/03 1815) Temp Source: Rectal (11/03 1815) BP: 110/76 (11/03 2145) Pulse Rate: 73 (11/03 2145)  Labs: Recent Labs    05/01/2019 1739 04/27/2019 1840 04/24/2019 2059  HGB 16.4*  --   --   HCT 53.2*  --   --   PLT 161  --   --   CREATININE  --  3.05*  --   TROPONINIHS  --   --  47*    Estimated Creatinine Clearance: 9.5 mL/min (A) (by C-G formula based on SCr of 3.05 mg/dL (H)).   Medical History: Past Medical History:  Diagnosis Date  . Aortic valve sclerosis   . Bradycardia   . Cardiac pacemaker in situ    St. Jude single chamber pacemaker model 1210 SN# [id number] with St Jude 1888tc RV SN# [id number] both implanted on 04/26/2011  . CHF (congestive heart failure) ([place])   . CVA (cerebral vascular accident) ([place])    Right sided weakness  . Diabetes mellitus without complication ([place])   . Dyspnea, unspecified   . Heart block    intermittent 3rd degree heart block with ventricular escape rate of approximatley 38bpm  . HFrEF (heart failure with reduced ejection fraction) ([place])   . History of chicken pox   . History of echocardiogram 03/07/2011   Normal LV size/function. Moderate aortic root dilation, Moderate MR. Severe TR. PASP 22mmHg.  Marland Kitchen Hyperlipidemia, unspecified   . Hypertension   . Osteoarthritis   . Pacemaker   . Paroxysmal atrial fibrillation ([place])    Documented in August 2016 at the time of CVA  . Stroke [facility]) 2005    Medications:  (Not in a hospital admission)   Assessment: Pharmacy consulted to dose heparin in this 85 yr old female admitted with ACS/NSTEMI.  CrCl = 09.5 ml/min Pt was on apixaban 2.5 mg PO BID PTA,  Last dose was on 11/3 @ 0800.   Goal  of Therapy:  Heparin level 0.3-0.7 units/ml  APTT = 66 - 102 s Monitor platelets by anticoagulation protocol: Yes   Plan:  Will order baseline INR, HL and aPTT. Will start heparin 600 units/hr ,  Will not bolus this pt. Will use aptt to guide dosing until HL and aPTT correlate.   Henny Strauch D 05/03/2019,9:59 PM

## 2019-04-09 NOTE — ED Notes (Signed)
Date and time results received: 2019-04-12 6:12 PM  Test: Lactic Critical Value: 2.3  Name of Provider Notified: Siadecki MD

## 2019-04-10 ENCOUNTER — Inpatient Hospital Stay (HOSPITAL_COMMUNITY)
Admission: EM | Admit: 2019-04-10 | Payer: Medicare HMO | Source: Other Acute Inpatient Hospital | Admitting: Family Medicine

## 2019-04-10 DIAGNOSIS — Z515 Encounter for palliative care: Secondary | ICD-10-CM | POA: Insufficient documentation

## 2019-04-10 DIAGNOSIS — F0390 Unspecified dementia without behavioral disturbance: Secondary | ICD-10-CM

## 2019-04-10 DIAGNOSIS — J9601 Acute respiratory failure with hypoxia: Secondary | ICD-10-CM | POA: Diagnosis present

## 2019-04-10 DIAGNOSIS — I4891 Unspecified atrial fibrillation: Secondary | ICD-10-CM

## 2019-04-10 DIAGNOSIS — R0902 Hypoxemia: Secondary | ICD-10-CM

## 2019-04-10 DIAGNOSIS — R6510 Systemic inflammatory response syndrome (SIRS) of non-infectious origin without acute organ dysfunction: Secondary | ICD-10-CM

## 2019-04-10 DIAGNOSIS — E870 Hyperosmolality and hypernatremia: Secondary | ICD-10-CM

## 2019-04-10 DIAGNOSIS — U071 COVID-19: Principal | ICD-10-CM

## 2019-04-10 DIAGNOSIS — E119 Type 2 diabetes mellitus without complications: Secondary | ICD-10-CM

## 2019-04-10 DIAGNOSIS — R627 Adult failure to thrive: Secondary | ICD-10-CM

## 2019-04-10 DIAGNOSIS — Z66 Do not resuscitate: Secondary | ICD-10-CM

## 2019-04-10 DIAGNOSIS — G9340 Encephalopathy, unspecified: Secondary | ICD-10-CM

## 2019-04-10 DIAGNOSIS — R451 Restlessness and agitation: Secondary | ICD-10-CM

## 2019-04-10 DIAGNOSIS — J9600 Acute respiratory failure, unspecified whether with hypoxia or hypercapnia: Secondary | ICD-10-CM

## 2019-04-10 DIAGNOSIS — N179 Acute kidney failure, unspecified: Secondary | ICD-10-CM

## 2019-04-10 LAB — GLUCOSE, CAPILLARY
Glucose-Capillary: 136 mg/dL — ABNORMAL HIGH (ref 70–99)
Glucose-Capillary: 97 mg/dL (ref 70–99)

## 2019-04-10 LAB — HIV ANTIBODY (ROUTINE TESTING W REFLEX): HIV Screen 4th Generation wRfx: NONREACTIVE

## 2019-04-10 LAB — SARS CORONAVIRUS 2 (TAT 6-24 HRS): SARS Coronavirus 2: POSITIVE — AB

## 2019-04-10 MED ORDER — GLYCOPYRROLATE 1 MG PO TABS
1.0000 mg | ORAL_TABLET | ORAL | Status: DC | PRN
  Filled 2019-04-10: qty 1

## 2019-04-10 MED ORDER — MORPHINE SULFATE (PF) 2 MG/ML IV SOLN
1.0000 mg | INTRAVENOUS | Status: DC | PRN
  Administered 2019-04-10 – 2019-04-13 (×5): 1 mg via INTRAVENOUS
  Filled 2019-04-10 (×5): qty 1

## 2019-04-10 MED ORDER — LORAZEPAM 2 MG/ML IJ SOLN
1.0000 mg | INTRAMUSCULAR | Status: DC | PRN
  Administered 2019-04-11: 08:00:00 1 mg via INTRAVENOUS
  Filled 2019-04-10 (×3): qty 1

## 2019-04-10 MED ORDER — GLYCOPYRROLATE 0.2 MG/ML IJ SOLN
0.2000 mg | INTRAMUSCULAR | Status: DC | PRN
  Filled 2019-04-10: qty 1

## 2019-04-10 MED ORDER — LORAZEPAM 2 MG/ML PO CONC
1.0000 mg | ORAL | Status: DC | PRN
  Filled 2019-04-10: qty 0.5

## 2019-04-10 MED ORDER — LORAZEPAM 1 MG PO TABS: 1.0000 mg | ORAL_TABLET | ORAL | Status: DC | PRN

## 2019-04-10 MED ORDER — MORPHINE SULFATE (PF) 2 MG/ML IV SOLN
INTRAVENOUS | Status: AC
  Administered 2019-04-10: 1 mg via INTRAVENOUS
  Filled 2019-04-10: qty 1

## 2019-04-10 NOTE — ED Notes (Signed)
bair hugger on pt. See temp. Pt moaning, not responding to voice, pulls away from mild painful stimuli. No change in status. Will continue to monitor.

## 2019-04-10 NOTE — ED Notes (Signed)
Patient currently resting peaceful with no sign or symptoms of distress. Will continue to monitor.

## 2019-04-10 NOTE — ED Notes (Signed)
Pt daughter updated on pt.

## 2019-04-10 NOTE — Consult Note (Addendum)
[place]  [phone] [phone]   Name: Tamara Campbell Date: 04/10/2019 MRN: [id number]  DOB: June 25, 1930  Patient Care Team: Kirk Ruths, MD as PCP - General (Internal Medicine) Nyoka Cowden Phylis Bougie, NP as Nurse Practitioner (Geriatric Medicine)    REASON FOR CONSULTATION: Palliative Care consult requested for this 85 y.o. female with multiple medical problems including dementia, CHB status post PPM, history of CVA, and diabetes, who was brought to the hospital on 04/21/2019 with altered mental status and poor oral intake.  Patient was found to be hypotensive and hypothermic.  Chest x-ray revealed bilateral lower lobe opacities concerning for pneumonia.  Patient was also found to have acute renal failure.  Family opted to pursue comfort care and patient was admitted for same.  Palliative care was consulted to help address goals and manage ongoing symptoms.  SOCIAL HISTORY:     reports that she has quit smoking. She has never used smokeless tobacco. She reports that she does not drink alcohol or use drugs.   Patient has been a resident at [facility] since March 2020.  She has 4 adult children.  ADVANCE DIRECTIVES:  HC POA is on file.  Daughter, Tamara Campbell is her healthcare power of attorney.  CODE STATUS: DNR  PAST MEDICAL HISTORY: Past Medical History:  Diagnosis Date   Aortic valve sclerosis    Bradycardia    Cardiac pacemaker in situ    St. Jude single chamber pacemaker model 1210 SN# [id number] with St Jude 1888tc RV SN# [id number] both implanted on 04/26/2011   CHF (congestive heart failure) (HCC)    CVA (cerebral vascular accident) ([place])    Right sided weakness   Diabetes mellitus without complication (HCC)    Dyspnea, unspecified    Heart block    intermittent 3rd degree heart block with ventricular escape rate of approximatley 38bpm   HFrEF (heart failure with reduced ejection fraction)  ([place])    History of chicken pox    History of echocardiogram 03/07/2011   Normal LV size/function. Moderate aortic root dilation, Moderate MR. Severe TR. PASP 57mmHg.   Hyperlipidemia, unspecified    Hypertension    Osteoarthritis    Pacemaker    Paroxysmal atrial fibrillation ([place])    Documented in August 2016 at the time of CVA   Stroke [facility] 2005    PAST SURGICAL HISTORY:  Past Surgical History:  Procedure Laterality Date   ABDOMINAL HYSTERECTOMY     INSERT REPLACE REMOVE PACEMAKER     JOINT REPLACEMENT Bilateral    hips   LUMBAR FUSION  1996   PACEMAKER PLACEMENT Left    DUAL CHAMBER PACEMAKER GENERATOR   TOTAL KNEE ARTHROPLASTY Right 08/10/2012   Procedure: RIGHT TOTAL KNEE ARTHROPLASTY; Surgeon: Zorita Pang, MD; Location: [place]; Service: Orthopedics; Laterality: Right;    HEMATOLOGY/ONCOLOGY HISTORY:  Oncology History   No history exists.    ALLERGIES:  has No Known Allergies.  MEDICATIONS:  Current Facility-Administered Medications  Medication Dose Route Frequency Provider Last Rate Last Dose   acetaminophen (TYLENOL) tablet 650 mg  650 mg Oral Q6H PRN Rise Patience, MD       Or   acetaminophen (TYLENOL) suppository 650 mg  650 mg Rectal Q6H PRN Rise Patience, MD   650 mg at 04/10/19 0555   glycopyrrolate (ROBINUL) tablet 1 mg  1 mg Oral Q4H PRN Danford, Suann Larry, MD       Or   glycopyrrolate (ROBINUL)  injection 0.2 mg  0.2 mg Subcutaneous Q4H PRN Danford, Earl Lites, MD       Or   glycopyrrolate (ROBINUL) injection 0.2 mg  0.2 mg Intravenous Q4H PRN Danford, Earl Lites, MD       LORazepam (ATIVAN) tablet 1 mg  1 mg Oral Q4H PRN Danford, Earl Lites, MD       Or   LORazepam (ATIVAN) 2 MG/ML concentrated solution 1 mg  1 mg Sublingual Q4H PRN Danford, Earl Lites, MD       Or   LORazepam (ATIVAN) injection 1 mg  1 mg Intravenous Q4H PRN Danford, Earl Lites, MD       morphine 2 MG/ML injection 1  mg  1 mg Intravenous Q2H PRN Danford, Earl Lites, MD       Current Outpatient Medications  Medication Sig Dispense Refill   amLODipine-benazepril (LOTREL) 10-20 MG capsule Take 1 capsule by mouth daily.      apixaban (ELIQUIS) 2.5 MG TABS tablet Take 1 tablet (2.5 mg total) by mouth 2 (two) times daily. 60 tablet    bimatoprost (LUMIGAN) 0.01 % SOLN Place 1 drop into both eyes at bedtime.      carvedilol (COREG) 6.25 MG tablet Take 1 tablet (6.25 mg total) by mouth 2 (two) times daily with a meal. 60 tablet 12   hydrochlorothiazide (HYDRODIURIL) 25 MG tablet Take 25 mg by mouth daily.      Multiple Vitamin (MULTIVITAMIN WITH MINERALS) TABS tablet Take 1 tablet by mouth daily.     polyethylene glycol (MIRALAX / GLYCOLAX) 17 g packet Take 17 g by mouth daily.     potassium chloride (K-DUR,KLOR-CON) 10 MEQ tablet Take 10 mEq by mouth daily.     senna-docusate (SENOKOT-S) 8.6-50 MG tablet Take 1 tablet by mouth daily.      VITAL SIGNS: BP 109/74    Pulse 73    Temp (!) 96.5 F (35.8 C) (Axillary)    Resp (!) 21    Ht 5' (1.524 m)    Wt 113 lb 12.1 oz (51.6 kg)    SpO2 92%    BMI 22.22 kg/m  Filed Weights   04-30-2019 1721  Weight: 113 lb 12.1 oz (51.6 kg)    Estimated body mass index is 22.22 kg/m as calculated from the following:   Height as of this encounter: 5' (1.524 m).   Weight as of this encounter: 113 lb 12.1 oz (51.6 kg).  LABS: CBC:    Component Value Date/Time   WBC 4.5 2019-04-30 1739   HGB 16.4 (H) 30-Apr-2019 1739   HCT 53.2 (H) Apr 30, 2019 1739   PLT 161 2019-04-30 1739   MCV 87.6 2019-04-30 1739   NEUTROABS 3.9 04-30-19 1739   LYMPHSABS 0.4 (L) 04-30-19 1739   MONOABS 0.1 04-30-19 1739   EOSABS 0.0 2019-04-30 1739   BASOSABS 0.0 2019/04/30 1739   Comprehensive Metabolic Panel:    Component Value Date/Time   NA 160 (H) 2019-04-30 1840   K 4.1 2019/04/30 1840   CL 123 (H) 2019-04-30 1840   CO2 18 (L) April 30, 2019 1840   BUN 98 (H) 04-30-19  1840   CREATININE 3.05 (H) 04/30/2019 1840   GLUCOSE 116 (H) Apr 30, 2019 1840   CALCIUM 9.2 30-Apr-2019 1840   AST 20 04-30-19 1840   ALT 11 Apr 30, 2019 1840   ALKPHOS 59 April 30, 2019 1840   BILITOT 0.8 Apr 30, 2019 1840   PROT 7.0 04-30-19 1840   ALBUMIN 2.9 (L) 04/30/2019 1840    RADIOGRAPHIC STUDIES: Ct  Head Wo Contrast  Result Date: 05/04/2019 CLINICAL DATA:  Altered mental status. EXAM: CT HEAD WITHOUT CONTRAST TECHNIQUE: Contiguous axial images were obtained from the base of the skull through the vertex without intravenous contrast. COMPARISON:  12/27/2016 FINDINGS: Brain: No acute large territory infarct, intracranial hemorrhage, mass, midline shift, or extra-axial fluid collection is identified. Chronic right frontoparietal, bilateral thalamic, and cerebellar infarcts are again seen. Confluent hypodensities in the cerebral white matter bilaterally are unchanged and nonspecific but compatible with severe chronic small vessel ischemic disease. There is mild cerebral atrophy. Vascular: Calcified atherosclerosis at the skull base. No hyperdense vessel. Skull: No fracture or focal osseous lesion. Sinuses/Orbits: Visualized paranasal sinuses and mastoid air cells are clear. Orbits are unremarkable. Other: None. IMPRESSION: 1. No evidence of acute intracranial abnormality. 2. Severe chronic small vessel ischemic disease with multiple chronic infarcts. Electronically Signed   By: Sebastian AcheAllen  Grady M.D.   On: 05/01/2019 20:36   Dg Chest Portable 1 View  Result Date: 04/15/2019 CLINICAL DATA:  Hypotension, possible sepsis EXAM: PORTABLE CHEST 1 VIEW COMPARISON:  03/10/2017 FINDINGS: Pacer remains in place, unchanged. Cardiomegaly, vascular congestion. Bilateral lower lobe airspace opacities are noted and could reflect pneumonia. No effusions or acute bony abnormality. IMPRESSION: Cardiomegaly, vascular congestion. Bilateral lower lobe opacities concerning for pneumonia. Electronically Signed   By: Charlett NoseKevin   Dover M.D.   On: 04/15/2019 19:17   Ct Renal Stone Study  Result Date: 04/07/2019 CLINICAL DATA:  Initial evaluation for acute altered mental status, flank pain. EXAM: CT ABDOMEN AND PELVIS WITHOUT CONTRAST TECHNIQUE: Multidetector CT imaging of the abdomen and pelvis was performed following the standard protocol without IV contrast. COMPARISON:  None available. FINDINGS: Lower chest: Patchy and consolidative ground-glass opacity seen within the left greater than right lung bases, concerning for possible pneumonia. Superimposed streaky atelectatic changes with bronchiectasis noted as well. Cardiomegaly with extensive 3 vessel coronary artery calcifications partially visualized. Left-sided pacemaker noted. Hepatobiliary: Multiple scattered hypodense lesions noted within the liver, largest of which measures 12 mm at the hepatic dome (series 2, image 10). Findings are indeterminate, and not well assessed on this noncontrast examination. Gallbladder contracted and not well seen. No appreciable biliary dilatation period with Pancreas: Pancreas grossly within normal limits. Spleen: Spleen within normal limits. Adrenals/Urinary Tract: Adrenal glands within normal limits. Kidneys equal in size. Probable punctate nonobstructive nephrolithiasis noted at the lower pole of the left kidney, measuring no more than 2-3 mm in size (series 5, image 53). No other radiopaque calculi seen within either kidney. No appreciable radiopaque calculi seen along the course of either renal collecting system. Evaluation limited by streak artifact from spinal fusion hardware as well as bilateral hip arthroplasties. No visible hydroureter. Bladder poorly assessed due to streak artifact from hip arthroplasties. Stomach/Bowel: Stomach decompressed without abnormality. No evidence for bowel obstruction. Appendix not well visualized, however, no appreciable inflammatory changes seen within the right lower quadrant or about the cecum to suggest  acute appendicitis. Scattered colonic diverticulosis without evidence for acute diverticulitis. No definite inflammatory changes seen about the bowels on this noncontrast examination. Vascular/Lymphatic: Aorta is diffusely tortuous with advanced aorto bi-iliac atherosclerotic disease. Aneurysmal dilatation up to 3.1 cm at the level of the aortic hiatus. No appreciable adenopathy. Reproductive: Pelvic structures not well visualized due to streak artifact from hip arthroplasties. No obvious pelvic or adnexal mass. Other: No visible free air or fluid. Musculoskeletal: No acute osseous abnormality. No discrete lytic or blastic osseous lesions. Sequelae of prior PLIF noted at L4-5. Bilateral hip  arthroplasties in place. Mild diffuse anasarca noted within the external soft tissues. IMPRESSION: 1. Patchy and consolidative ground-glass opacity within the left greater than right lung bases, concerning for possible pneumonia. 2. No other acute intra-abdominal or pelvic process identified. 3. Punctate nonobstructive left nephrolithiasis. No evidence for obstructive uropathy. 4. Colonic diverticulosis without evidence for acute diverticulitis. 5. Advanced aorto bi-iliac atherosclerotic disease with aneurysmal dilatation up to 3.1 cm at the level of the aortic hiatus. Recommend followup by ultrasound in 3 years. This recommendation follows ACR consensus guidelines: White Paper of the ACR Incidental Findings Committee II on Vascular Findings. J Am Coll Radiol 2013; 10:789-794. Aortic aneurysm NOS (ICD10-I71.9) 6. Cardiomegaly with extensive 3 vessel coronary artery calcifications. Electronically Signed   By: Rise Mu M.D.   On: 05/04/2019 20:57    PERFORMANCE STATUS (ECOG) : 4 - Bedbound  Review of Systems Unable to complete  Physical Exam General: Critically ill-appearing Cardiovascular: Tacky Pulmonary: Labored breathing Neurological: Poorly responsive  IMPRESSION: Patient is critically ill-appearing.   She is hypoxic on 4 L O2 via nasal cannula.  Patient moans when stimulated.  I called and spoke with patient's daughter, Lonell Grandchild, by phone.  Together, we discussed patient's current medical problems.  She verbalized an understanding that patient is critically ill and is likely nearing end-of-life.  She confirmed that her desire is to just focus on comfort care, which we discussed in detail.  She is not interested in patient transferring to an outside facility or returning to SNF.  I am not sure that patient would survive the transfer.  I anticipate an in-hospital death.  Daughter states that patient has been declining over the last several months.  Patient is mostly bedbound and has had persistently poor oral intake and weight loss.  Daughter suggests that patient's quality of life had become quite poor.  Case discussed with Dr. Maryfrances Bunnell and nursing staff.  PLAN: -Comfort care -Recommend initiation of a hydromorphone continuous infusion -Recommend adding IV lorazepam for agitation -Suggest stopping IVFs -Continue O2 for comfort -DNR/DNI   Time Total: 60 minutes  Visit consisted of counseling and education dealing with the complex and emotionally intense issues of symptom management and palliative care in the setting of serious and potentially life-threatening illness.Greater than 50%  of this time was spent counseling and coordinating care related to the above assessment and plan.  Signed by: Laurette Schimke, PhD, NP-C

## 2019-04-10 NOTE — ED Notes (Signed)
Pt placed on comfort care per hospitalist, all procedures discontinued, monitoring discontinued. Daughter went home.

## 2019-04-10 NOTE — Progress Notes (Signed)
PROGRESS NOTE    LOVE CHOWNING  HUT:[id number] DOB: 1930-10-17 DOA: 04/12/2019 PCP: Kirk Ruths, MD      Brief Narrative:  Mrs. Haberle is a 85 y.o. F with advanced dementia, CHB s/p pacer, pAF not on AC, hx CVA, and DM who presented with progressive confusion and poor PO intake from SNF.  In the ER, patient mentation poor, hypotensive and hypothermic.  CXR showed pneumonia, CT head unremarkable, Cr >5 (from baseline 0.9).  SARS-CoV-2 positive.       Assessment & Plan:  COVID with acute respiratory failure Acute renal failure from COVID Acute metabolic encpehalopathy from COVID Hypernatremia and dehydration from COVID Palliative care were consulted.  Due to her limited functional status at baseline and the low likelihood of recovery of a meaningful quality of life given her multiple organ failure, the patient's daughter asked that comfort measures be provided, but that no extreme measures or artificial means be used to prolong the patient's suffering, and that she be kept at [facility].   -Comfort measures -Stop fluids -Q2 turns, oral cares -Morphine for pain, Ativan for agitation, Robinul for secretions     Atrial fibrillation Dementia Diabetes        MDM and disposition: The below labs and imaging reports were reviewed and summarized above.  Medication management as above.  The patient was admitted with COVID and found to have renal failure, severe encpehalopathy and pneumonia.  Given her multiple organ failure, her prognosis is estimated at days to a week.  We will respect patient and proxy wishes to avoid futile medical interventions and allow a natural death.         DVT prophylaxis: N?A Code Status: DNR Family Communication:     Consultants:   Palliative Care  Procedures:     Antimicrobials:       Subjective: Nursing report the patient is agitated at times, improves with morphine.  She is unable to make her needs known.  Objective:  Vitals:   04/10/19 1021 04/10/19 1100 04/10/19 1200 04/10/19 1842  BP:  106/77 109/74 91/74  Pulse: 73   73  Resp: (!) 25 17 (!) 21 (!) 21  Temp:    (!) 97.4 F (36.3 C)  TempSrc:    Oral  SpO2: 90% 92%  93%  Weight:      Height:        Intake/Output Summary (Last 24 hours) at 04/10/2019 1855 Last data filed at 04/10/2019 1435 Gross per 24 hour  Intake 2974.9 ml  Output 0 ml  Net 2974.9 ml   Filed Weights   April 12, 2019 1721  Weight: 51.6 kg    Examination: General appearance: elderly frail adult female, obtunded. Appears queiet on my exam HEENT: Anicteric, conjunctiva pink, lids and lashes normal. Keeps eyes closed.  No nasal deformity, discharge, epistaxis.  Lips dry, OP dry.   Skin: Warm and dry and tented.  No jaundice.  No suspicious rashes or lesions. Cardiac: RRR, nl S1-S2, no murmurs appreciated.  Capillary refill is brisk.  JVPnot visible.  No LE edema.    Respiratory: Tachypneic, respiratory effort shallow.  Lung sounds diminished, no rales. Abdomen: Abdomen soft.  No grimace to palpation. No ascites, distension, hepatosplenomegaly.   MSK: No deformities or effusions.  Diffuse loss of subcutaneous muscle mass and fat Neuro: Obtunded.  Does not spontaneously open the eyes.  She shifts spontaneously, but makes no purposeful movements or spontaneous verbalizations.  Other than guarding.  Psych: Unable to assess.  Data Reviewed: I have personally reviewed following labs and imaging studies:  CBC: Recent Labs  Lab 04/20/2019 1739  WBC 4.5  NEUTROABS 3.9  HGB 16.4*  HCT 53.2*  MCV 87.6  PLT 161   Basic Metabolic Panel: Recent Labs  Lab 04/07/2019 1840  NA 160*  K 4.1  CL 123*  CO2 18*  GLUCOSE 116*  BUN 98*  CREATININE 3.05*  CALCIUM 9.2   GFR: Estimated Creatinine Clearance: 9.5 mL/min (A) (by C-G formula based on SCr of 3.05 mg/dL (H)). Liver Function Tests: Recent Labs  Lab 04/29/2019 1840  AST 20  ALT 11  ALKPHOS 59  BILITOT 0.8  PROT 7.0   ALBUMIN 2.9*   No results for input(s): LIPASE, AMYLASE in the last 168 hours. No results for input(s): AMMONIA in the last 168 hours. Coagulation Profile: Recent Labs  Lab 04/18/2019 2243  INR 1.3*   Cardiac Enzymes: No results for input(s): CKTOTAL, CKMB, CKMBINDEX, TROPONINI in the last 168 hours. BNP (last 3 results) No results for input(s): PROBNP in the last 8760 hours. HbA1C: No results for input(s): HGBA1C in the last 72 hours. CBG: Recent Labs  Lab 04/10/19 0033 04/10/19 0425  GLUCAP 97 136*   Lipid Profile: No results for input(s): CHOL, HDL, LDLCALC, TRIG, CHOLHDL, LDLDIRECT in the last 72 hours. Thyroid Function Tests: No results for input(s): TSH, T4TOTAL, FREET4, T3FREE, THYROIDAB in the last 72 hours. Anemia Panel: No results for input(s): VITAMINB12, FOLATE, FERRITIN, TIBC, IRON, RETICCTPCT in the last 72 hours. Urine analysis:    Component Value Date/Time   COLORURINE AMBER (A) 04/11/2019 1735   APPEARANCEUR CLOUDY (A) 04/27/2019 1735   LABSPEC 1.019 04/12/2019 1735   PHURINE 5.0 04/17/2019 1735   GLUCOSEU NEGATIVE 04/07/2019 1735   HGBUR SMALL (A) 04/14/2019 1735   BILIRUBINUR NEGATIVE 04/17/2019 1735   KETONESUR NEGATIVE 04/28/2019 1735   PROTEINUR NEGATIVE 04/19/2019 1735   UROBILINOGEN 0.2 01/11/2015 2338   NITRITE NEGATIVE 04/28/2019 1735   LEUKOCYTESUR TRACE (A) 04/19/2019 1735   Sepsis Labs: @LABRCNTIP (procalcitonin:4,lacticacidven:4)  ) Recent Results (from the past 240 hour(s))  Culture, blood (routine x 2)     Status: None (Preliminary result)   Collection Time: 04/24/2019  5:39 PM   Specimen: BLOOD  Result Value Ref Range Status   Specimen Description BLOOD LEFT ANTECUBITAL  Final   Special Requests   Final    BOTTLES DRAWN AEROBIC AND ANAEROBIC Blood Culture adequate volume   Culture   Final    NO GROWTH < 24 HOURS Performed at [facility], [address] [place], [place] [address]    Report Status PENDING  Incomplete   Culture, blood (routine x 2)     Status: None (Preliminary result)   Collection Time: 04/14/2019  5:40 PM   Specimen: BLOOD  Result Value Ref Range Status   Specimen Description BLOOD BLOOD RIGHT FOREARM  Final   Special Requests   Final    BOTTLES DRAWN AEROBIC AND ANAEROBIC Blood Culture adequate volume   Culture   Final    NO GROWTH < 24 HOURS Performed at [facility], [address] [place], [place] [address]    Report Status PENDING  Incomplete  SARS CORONAVIRUS 2 (TAT 6-24 HRS) Nasopharyngeal Nasopharyngeal Swab     Status: Abnormal   Collection Time: 04/27/2019  6:34 PM   Specimen: Nasopharyngeal Swab  Result Value Ref Range Status   SARS Coronavirus 2 POSITIVE (A) NEGATIVE Final    Comment: RESULT CALLED TO,  READ BACK BY AND VERIFIED WITH: R DAVID,RN 0336 04/10/2019 D BRADLEY (NOTE) SARS-CoV-2 target nucleic acids are DETECTED. The SARS-CoV-2 RNA is generally detectable in upper and lower respiratory specimens during the acute phase of infection. Positive results are indicative of active infection with SARS-CoV-2. Clinical  correlation with patient history and other diagnostic information is necessary to determine patient infection status. Positive results do  not rule out bacterial infection or co-infection with other viruses. The expected result is Negative. Fact Sheet for Patients: HairSlick.no Fact Sheet for Healthcare Providers: quierodirigir.com This test is not yet approved or cleared by the [place] FDA and  has been authorized for detection and/or diagnosis of SARS-CoV-2 by FDA under an Emergency Use Authorization (EUA). This EUA will remain  in effect (meaning this test can be used) for t he duration of the COVID-19 declaration under Section 564(b)(1) of the Act, 21 U.S.C. section 360bbb-3(b)(1), unless the authorization is terminated or revoked sooner. Performed at [facility],  [address] [place], [place] [address]          Radiology Studies: Ct Head Wo Contrast  Result Date: 04/16/2019 CLINICAL DATA:  Altered mental status. EXAM: CT HEAD WITHOUT CONTRAST TECHNIQUE: Contiguous axial images were obtained from the base of the skull through the vertex without intravenous contrast. COMPARISON:  12/27/2016 FINDINGS: Brain: No acute large territory infarct, intracranial hemorrhage, mass, midline shift, or extra-axial fluid collection is identified. Chronic right frontoparietal, bilateral thalamic, and cerebellar infarcts are again seen. Confluent hypodensities in the cerebral white matter bilaterally are unchanged and nonspecific but compatible with severe chronic small vessel ischemic disease. There is mild cerebral atrophy. Vascular: Calcified atherosclerosis at the skull base. No hyperdense vessel. Skull: No fracture or focal osseous lesion. Sinuses/Orbits: Visualized paranasal sinuses and mastoid air cells are clear. Orbits are unremarkable. Other: None. IMPRESSION: 1. No evidence of acute intracranial abnormality. 2. Severe chronic small vessel ischemic disease with multiple chronic infarcts. Electronically Signed   By: Sebastian Ache M.D.   On: 04/08/2019 20:36   Dg Chest Portable 1 View  Result Date: 04/24/2019 CLINICAL DATA:  Hypotension, possible sepsis EXAM: PORTABLE CHEST 1 VIEW COMPARISON:  03/10/2017 FINDINGS: Pacer remains in place, unchanged. Cardiomegaly, vascular congestion. Bilateral lower lobe airspace opacities are noted and could reflect pneumonia. No effusions or acute bony abnormality. IMPRESSION: Cardiomegaly, vascular congestion. Bilateral lower lobe opacities concerning for pneumonia. Electronically Signed   By: Charlett Nose M.D.   On: 04/10/2019 19:17   Ct Renal Stone Study  Result Date: 04/27/2019 CLINICAL DATA:  Initial evaluation for acute altered mental status, flank pain. EXAM: CT ABDOMEN AND PELVIS WITHOUT CONTRAST TECHNIQUE: Multidetector CT  imaging of the abdomen and pelvis was performed following the standard protocol without IV contrast. COMPARISON:  None available. FINDINGS: Lower chest: Patchy and consolidative ground-glass opacity seen within the left greater than right lung bases, concerning for possible pneumonia. Superimposed streaky atelectatic changes with bronchiectasis noted as well. Cardiomegaly with extensive 3 vessel coronary artery calcifications partially visualized. Left-sided pacemaker noted. Hepatobiliary: Multiple scattered hypodense lesions noted within the liver, largest of which measures 12 mm at the hepatic dome (series 2, image 10). Findings are indeterminate, and not well assessed on this noncontrast examination. Gallbladder contracted and not well seen. No appreciable biliary dilatation period with Pancreas: Pancreas grossly within normal limits. Spleen: Spleen within normal limits. Adrenals/Urinary Tract: Adrenal glands within normal limits. Kidneys equal in size. Probable punctate nonobstructive nephrolithiasis noted at the lower pole of the left  kidney, measuring no more than 2-3 mm in size (series 5, image 53). No other radiopaque calculi seen within either kidney. No appreciable radiopaque calculi seen along the course of either renal collecting system. Evaluation limited by streak artifact from spinal fusion hardware as well as bilateral hip arthroplasties. No visible hydroureter. Bladder poorly assessed due to streak artifact from hip arthroplasties. Stomach/Bowel: Stomach decompressed without abnormality. No evidence for bowel obstruction. Appendix not well visualized, however, no appreciable inflammatory changes seen within the right lower quadrant or about the cecum to suggest acute appendicitis. Scattered colonic diverticulosis without evidence for acute diverticulitis. No definite inflammatory changes seen about the bowels on this noncontrast examination. Vascular/Lymphatic: Aorta is diffusely tortuous with  advanced aorto bi-iliac atherosclerotic disease. Aneurysmal dilatation up to 3.1 cm at the level of the aortic hiatus. No appreciable adenopathy. Reproductive: Pelvic structures not well visualized due to streak artifact from hip arthroplasties. No obvious pelvic or adnexal mass. Other: No visible free air or fluid. Musculoskeletal: No acute osseous abnormality. No discrete lytic or blastic osseous lesions. Sequelae of prior PLIF noted at L4-5. Bilateral hip arthroplasties in place. Mild diffuse anasarca noted within the external soft tissues. IMPRESSION: 1. Patchy and consolidative ground-glass opacity within the left greater than right lung bases, concerning for possible pneumonia. 2. No other acute intra-abdominal or pelvic process identified. 3. Punctate nonobstructive left nephrolithiasis. No evidence for obstructive uropathy. 4. Colonic diverticulosis without evidence for acute diverticulitis. 5. Advanced aorto bi-iliac atherosclerotic disease with aneurysmal dilatation up to 3.1 cm at the level of the aortic hiatus. Recommend followup by ultrasound in 3 years. This recommendation follows ACR consensus guidelines: White Paper of the ACR Incidental Findings Committee II on Vascular Findings. J Am Coll Radiol 2013; 10:789-794. Aortic aneurysm NOS (ICD10-I71.9) 6. Cardiomegaly with extensive 3 vessel coronary artery calcifications. Electronically Signed   By: Rise Mu M.D.   On: 04/22/2019 20:57        Scheduled Meds: Continuous Infusions:   LOS: 1 day    Time spent: 25 minutes    Alberteen Sam, MD [facility] 04/10/2019, 6:55 PM     Please page through AMION:  [email or web address] Password [email or web address] If 7PM-7AM, please contact night-coverage

## 2019-04-11 DIAGNOSIS — J069 Acute upper respiratory infection, unspecified: Secondary | ICD-10-CM

## 2019-04-11 DIAGNOSIS — U071 COVID-19: Secondary | ICD-10-CM | POA: Diagnosis present

## 2019-04-11 NOTE — ED Notes (Signed)
Patient tossing her head back and forth and moaning in bed. Patient given PRN med, see MAR. Patient turned to left side with pillows under right side/buttocks. Pillow placed under legs for comfort. bair hugger in place to keep patient warm

## 2019-04-11 NOTE — ED Notes (Signed)
Called floor as they have not transported pt from ED yet. Floor states on their way.

## 2019-04-11 NOTE — ED Notes (Addendum)
Patient resting in bed, labored breathing. Danford,MD at bedside- reported patient appears comfortable and does not need any meds at this time. Reports can turn patient every 4 hours. Danford,MD reported he will be calling patients daughter [name].

## 2019-04-11 NOTE — ED Notes (Signed)
Floor staff to come transport pt soon as ER is in surge red.

## 2019-04-11 NOTE — Progress Notes (Signed)
Patient brought to unit via transport, placed on airborne/contact precautions. Patient is non verbal, and tachypneic, stg 2 pressure ulcer noted on left buttock, cleansed and covered with foam. Patient provided with linen, gown change, and calm. Peaceful environment for comfort.

## 2019-04-11 NOTE — Progress Notes (Signed)
PROGRESS NOTE    Tamara Campbell  JXB:[id number] DOB: [birth year] DOA: 2019/04/10 PCP: Lauro Regulus, MD      Brief Narrative:  Tamara Campbell is a 85 y.o. F with advanced dementia, CHB s/p pacer, pAF not on AC, hx CVA, and DM who presented with progressive confusion and poor PO intake from SNF.  In the ER, patient mentation poor, hypotensive and hypothermic.  CXR showed pneumonia, CT head unremarkable, Cr >5 (from baseline 0.9).  SARS-CoV-2 positive.       Assessment & Plan:  COVID with acute respiratory failure Acute renal failure from COVID Acute metabolic encpehalopathy from COVID Hypernatremia and dehydration from COVID Palliative care were consulted.  Due to her limited functional status at baseline and the low likelihood of recovery of a meaningful quality of life given her multiple organ failure, the patient's daughter asked that comfort measures be provided, but that no extreme measures or artificial means be used to prolong the patient's suffering, and that she be kept at [facility].   -Continue comfort measures -Continue q2 turns, oral cares -Continue morphine for pain, Ativan for agitation, Robinul for secretions     Atrial fibrillation Dementia Diabetes        MDM and disposition: The below labs and imaging reports reviewed and summarized above.  Medication management as above.   Patient was admitted with Covid, found to have renal failure, severe encephalopathy, pneumonia.  Given her multiple organ failure, her poor functional status prior to admission, suspect that her prognosis is a few days.  Expected intermittent hospital death.    DVT prophylaxis: N/A Code Status: DNR Family Communication: Called to daughter Tamara Campbell, no answer, Left VM    Consultants:   Palliative Care  Procedures:     Antimicrobials:       Subjective: Patient unable to make her needs known.  Nursing report that she is comfortable.  No fever.     Objective:  Vitals:   04/11/19 0115 04/11/19 0130 04/11/19 0645 04/11/19 0715  BP:      Pulse: 72 73 71 72  Resp: (!) 21 19 (!) 21 19  Temp:      TempSrc:      SpO2: 93% 93% 90% 91%  Weight:      Height:        Intake/Output Summary (Last 24 hours) at 04/11/2019 0806 Last data filed at 04/11/2019 0748 Gross per 24 hour  Intake 974.9 ml  Output 150 ml  Net 824.9 ml   Filed Weights   04/10/2019 1721  Weight: 51.6 kg    Examination: General appearance: Frail elderly adult female, obtunded.   HEENT: Anicteric, conjunctiva pink, lids and lashes atrophic. No nasal deformity, discharge, epistaxis.  Lips, oropharynx dry Skin: Dry and tented Cardiac: Tachycardic, regular, no lower extremity edema    Respiratory: Rapid, shallow, no rales or wheezes Abdomen: Scaphoid, without rigidity or rebound. MSK: Diffuse severe loss of subcutaneous muscle mass and fat Neuro: Patient is obtunded.  She has no recall from noxious stimuli.  She makes no spontaneous movements or verbalizations. Psych: Unable to assess      Data Reviewed: I have personally reviewed following labs and imaging studies:  CBC: Recent Labs  Lab Apr 10, 2019 1739  WBC 4.5  NEUTROABS 3.9  HGB 16.4*  HCT 53.2*  MCV 87.6  PLT 161   Basic Metabolic Panel: Recent Labs  Lab April 10, 2019 1840  NA 160*  K 4.1  CL 123*  CO2 18*  GLUCOSE 116*  BUN  98*  CREATININE 3.05*  CALCIUM 9.2   GFR: Estimated Creatinine Clearance: 9.5 mL/min (A) (by C-G formula based on SCr of 3.05 mg/dL (H)). Liver Function Tests: Recent Labs  Lab May 06, 2019 1840  AST 20  ALT 11  ALKPHOS 59  BILITOT 0.8  PROT 7.0  ALBUMIN 2.9*   No results for input(s): LIPASE, AMYLASE in the last 168 hours. No results for input(s): AMMONIA in the last 168 hours. Coagulation Profile: Recent Labs  Lab 2019-05-06 2243  INR 1.3*   Cardiac Enzymes: No results for input(s): CKTOTAL, CKMB, CKMBINDEX, TROPONINI in the last 168 hours. BNP (last 3 results) No results  for input(s): PROBNP in the last 8760 hours. HbA1C: No results for input(s): HGBA1C in the last 72 hours. CBG: Recent Labs  Lab 04/10/19 0033 04/10/19 0425  GLUCAP 97 136*   Lipid Profile: No results for input(s): CHOL, HDL, LDLCALC, TRIG, CHOLHDL, LDLDIRECT in the last 72 hours. Thyroid Function Tests: No results for input(s): TSH, T4TOTAL, FREET4, T3FREE, THYROIDAB in the last 72 hours. Anemia Panel: No results for input(s): VITAMINB12, FOLATE, FERRITIN, TIBC, IRON, RETICCTPCT in the last 72 hours. Urine analysis:    Component Value Date/Time   COLORURINE AMBER (A) May 06, 2019 1735   APPEARANCEUR CLOUDY (A) May 06, 2019 1735   LABSPEC 1.019 05-06-19 1735   PHURINE 5.0 May 06, 2019 1735   GLUCOSEU NEGATIVE May 06, 2019 1735   HGBUR SMALL (A) 2019/05/06 1735   BILIRUBINUR NEGATIVE 2019-05-06 1735   KETONESUR NEGATIVE 05/06/2019 1735   PROTEINUR NEGATIVE 05-06-19 1735   UROBILINOGEN 0.2 01/11/2015 2338   NITRITE NEGATIVE 05/06/19 1735   LEUKOCYTESUR TRACE (A) 05-06-2019 1735   Sepsis Labs: @LABRCNTIP (procalcitonin:4,lacticacidven:4)  ) Recent Results (from the past 240 hour(s))  Culture, blood (routine x 2)     Status: None (Preliminary result)   Collection Time: 05-06-2019  5:39 PM   Specimen: BLOOD  Result Value Ref Range Status   Specimen Description BLOOD LEFT ANTECUBITAL  Final   Special Requests   Final    BOTTLES DRAWN AEROBIC AND ANAEROBIC Blood Culture adequate volume   Culture   Final    NO GROWTH 2 DAYS Performed at [facility], [address] [place], [place] [address]    Report Status PENDING  Incomplete  Culture, blood (routine x 2)     Status: None (Preliminary result)   Collection Time: 05-06-2019  5:40 PM   Specimen: BLOOD  Result Value Ref Range Status   Specimen Description BLOOD BLOOD RIGHT FOREARM  Final   Special Requests   Final    BOTTLES DRAWN AEROBIC AND ANAEROBIC Blood Culture adequate volume   Culture   Final    NO GROWTH 2  DAYS Performed at [facility], [address] [place], [place] [address]    Report Status PENDING  Incomplete  SARS CORONAVIRUS 2 (TAT 6-24 HRS) Nasopharyngeal Nasopharyngeal Swab     Status: Abnormal   Collection Time: 05-06-2019  6:34 PM   Specimen: Nasopharyngeal Swab  Result Value Ref Range Status   SARS Coronavirus 2 POSITIVE (A) NEGATIVE Final    Comment: RESULT CALLED TO, READ BACK BY AND VERIFIED WITH: R DAVID,RN 0336 04/10/2019 D BRADLEY (NOTE) SARS-CoV-2 target nucleic acids are DETECTED. The SARS-CoV-2 RNA is generally detectable in upper and lower respiratory specimens during the acute phase of infection. Positive results are indicative of active infection with SARS-CoV-2. Clinical  correlation with patient history and other diagnostic information is necessary to determine patient infection status. Positive results do  not rule out bacterial infection or co-infection with other viruses. The expected result is Negative. Fact Sheet for Patients: HairSlick.nohttps://www.fda.gov/media/138098/download Fact Sheet for Healthcare Providers: quierodirigir.comhttps://www.fda.gov/media/138095/download This test is not yet approved or cleared by the [place] FDA and  has been authorized for detection and/or diagnosis of SARS-CoV-2 by FDA under an Emergency Use Authorization (EUA). This EUA will remain  in effect (meaning this test can be used) for t he duration of the COVID-19 declaration under Section 564(b)(1) of the Act, 21 U.S.C. section 360bbb-3(b)(1), unless the authorization is terminated or revoked sooner. Performed at [facility], [address] [place], [place] [address]          Radiology Studies: Ct Head Wo Contrast  Result Date: 04/25/2019 CLINICAL DATA:  Altered mental status. EXAM: CT HEAD WITHOUT CONTRAST TECHNIQUE: Contiguous axial images were obtained from the base of the skull through the vertex without intravenous contrast. COMPARISON:  12/27/2016 FINDINGS:  Brain: No acute large territory infarct, intracranial hemorrhage, mass, midline shift, or extra-axial fluid collection is identified. Chronic right frontoparietal, bilateral thalamic, and cerebellar infarcts are again seen. Confluent hypodensities in the cerebral white matter bilaterally are unchanged and nonspecific but compatible with severe chronic small vessel ischemic disease. There is mild cerebral atrophy. Vascular: Calcified atherosclerosis at the skull base. No hyperdense vessel. Skull: No fracture or focal osseous lesion. Sinuses/Orbits: Visualized paranasal sinuses and mastoid air cells are clear. Orbits are unremarkable. Other: None. IMPRESSION: 1. No evidence of acute intracranial abnormality. 2. Severe chronic small vessel ischemic disease with multiple chronic infarcts. Electronically Signed   By: Sebastian AcheAllen  Grady M.D.   On: July 06, 2018 20:36   Dg Chest Portable 1 View  Result Date: 04/10/2019 CLINICAL DATA:  Hypotension, possible sepsis EXAM: PORTABLE CHEST 1 VIEW COMPARISON:  03/10/2017 FINDINGS: Pacer remains in place, unchanged. Cardiomegaly, vascular congestion. Bilateral lower lobe airspace opacities are noted and could reflect pneumonia. No effusions or acute bony abnormality. IMPRESSION: Cardiomegaly, vascular congestion. Bilateral lower lobe opacities concerning for pneumonia. Electronically Signed   By: Charlett NoseKevin  Dover M.D.   On: July 06, 2018 19:17   Ct Renal Stone Study  Result Date: 04/08/2019 CLINICAL DATA:  Initial evaluation for acute altered mental status, flank pain. EXAM: CT ABDOMEN AND PELVIS WITHOUT CONTRAST TECHNIQUE: Multidetector CT imaging of the abdomen and pelvis was performed following the standard protocol without IV contrast. COMPARISON:  None available. FINDINGS: Lower chest: Patchy and consolidative ground-glass opacity seen within the left greater than right lung bases, concerning for possible pneumonia. Superimposed streaky atelectatic changes with bronchiectasis noted  as well. Cardiomegaly with extensive 3 vessel coronary artery calcifications partially visualized. Left-sided pacemaker noted. Hepatobiliary: Multiple scattered hypodense lesions noted within the liver, largest of which measures 12 mm at the hepatic dome (series 2, image 10). Findings are indeterminate, and not well assessed on this noncontrast examination. Gallbladder contracted and not well seen. No appreciable biliary dilatation period with Pancreas: Pancreas grossly within normal limits. Spleen: Spleen within normal limits. Adrenals/Urinary Tract: Adrenal glands within normal limits. Kidneys equal in size. Probable punctate nonobstructive nephrolithiasis noted at the lower pole of the left kidney, measuring no more than 2-3 mm in size (series 5, image 53). No other radiopaque calculi seen within either kidney. No appreciable radiopaque calculi seen along the course of either renal collecting system. Evaluation limited by streak artifact from spinal fusion hardware as well as bilateral hip arthroplasties. No visible hydroureter. Bladder poorly assessed due to streak artifact from hip arthroplasties. Stomach/Bowel: Stomach decompressed without abnormality.  No evidence for bowel obstruction. Appendix not well visualized, however, no appreciable inflammatory changes seen within the right lower quadrant or about the cecum to suggest acute appendicitis. Scattered colonic diverticulosis without evidence for acute diverticulitis. No definite inflammatory changes seen about the bowels on this noncontrast examination. Vascular/Lymphatic: Aorta is diffusely tortuous with advanced aorto bi-iliac atherosclerotic disease. Aneurysmal dilatation up to 3.1 cm at the level of the aortic hiatus. No appreciable adenopathy. Reproductive: Pelvic structures not well visualized due to streak artifact from hip arthroplasties. No obvious pelvic or adnexal mass. Other: No visible free air or fluid. Musculoskeletal: No acute osseous  abnormality. No discrete lytic or blastic osseous lesions. Sequelae of prior PLIF noted at L4-5. Bilateral hip arthroplasties in place. Mild diffuse anasarca noted within the external soft tissues. IMPRESSION: 1. Patchy and consolidative ground-glass opacity within the left greater than right lung bases, concerning for possible pneumonia. 2. No other acute intra-abdominal or pelvic process identified. 3. Punctate nonobstructive left nephrolithiasis. No evidence for obstructive uropathy. 4. Colonic diverticulosis without evidence for acute diverticulitis. 5. Advanced aorto bi-iliac atherosclerotic disease with aneurysmal dilatation up to 3.1 cm at the level of the aortic hiatus. Recommend followup by ultrasound in 3 years. This recommendation follows ACR consensus guidelines: White Paper of the ACR Incidental Findings Committee II on Vascular Findings. J Am Coll Radiol 2013; 10:789-794. Aortic aneurysm NOS (ICD10-I71.9) 6. Cardiomegaly with extensive 3 vessel coronary artery calcifications. Electronically Signed   By: Rise Mu M.D.   On: 04/26/2019 20:57        Scheduled Meds: Continuous Infusions:   LOS: 2 days    Time spent: 25 minutes    Alberteen Sam, MD [facility] 04/11/2019, 8:06 AM     Please page through AMION:  [email or web address] Password [email or web address] If 7PM-7AM, please contact night-coverage

## 2019-04-11 NOTE — ED Notes (Addendum)
Patient experiencing dyspnea/appears uncomfortable. PAtient given meds. See MAR. Patient turned to right side with pillows under left side/buttocks. Pure wick still in place

## 2019-04-12 DIAGNOSIS — J9601 Acute respiratory failure with hypoxia: Secondary | ICD-10-CM

## 2019-04-12 DIAGNOSIS — I5022 Chronic systolic (congestive) heart failure: Secondary | ICD-10-CM

## 2019-04-12 DIAGNOSIS — F0150 Vascular dementia without behavioral disturbance: Secondary | ICD-10-CM

## 2019-04-12 DIAGNOSIS — E1142 Type 2 diabetes mellitus with diabetic polyneuropathy: Secondary | ICD-10-CM

## 2019-04-12 DIAGNOSIS — L8990 Pressure ulcer of unspecified site, unspecified stage: Secondary | ICD-10-CM

## 2019-04-12 DIAGNOSIS — N179 Acute kidney failure, unspecified: Secondary | ICD-10-CM

## 2019-04-12 DIAGNOSIS — E870 Hyperosmolality and hypernatremia: Secondary | ICD-10-CM

## 2019-04-12 DIAGNOSIS — J1289 Other viral pneumonia: Secondary | ICD-10-CM

## 2019-04-12 NOTE — Progress Notes (Signed)
PROGRESS NOTE    [name]  QPY:[id number] DOB: 09-Feb-1931 DOA: 05-07-19 PCP: Dorothey Baseman, MD      Brief Narrative:  Tamara Campbell is a 85 y.o. F with advanced dementia, CHB s/p pacer, pAF not on AC, hx CVA, and DM who presented with progressive confusion and poor PO intake from SNF.  In the ER, patient mentation poor, hypotensive and hypothermic.  CXR showed pneumonia, CT head unremarkable, Cr >5 (from baseline 0.9).  SARS-CoV-2 positive.       Assessment & Plan:  COVID with acute respiratory failure Acute renal failure from COVID Acute metabolic encpehalopathy from COVID Hypernatremia and dehydration from COVID Palliative care were consulted.  Due to her limited functional status at baseline and the low likelihood of recovery of a meaningful quality of life given her multiple organ failure, the patient's daughter asked that comfort measures be provided, but that no extreme measures or artificial means be used to prolong the patient's suffering, and that she be kept at [facility].   -Continue comfort measures -Continue q2 turns, oral cares -Continue morphine for pain, Ativan for agitation, Robinul for secretions     Atrial fibrillation Dementia Diabetes Protein calorie malnutrition, severe       MDM and disposition: The below labs and imaging reports reviewed and summarized above.  Medication management as above.   Patient was admitted with Covid, found to have renal failure, severe encephalopathy, pneumonia.   She continues to decline and has become less responsive.  We expect an imminent hospital test.   DVT prophylaxis: N/A Code Status: DNR Family Communication: Again attempted to call daughter Vikki Ports, no answer, Left VM    Consultants:   Palliative Care  Procedures:     Antimicrobials:       Subjective: No nursing complaints.  No fever.  Respirations are shallow and appear to be slowing down.  She does not respond to stimuli today.      Objective: Vitals:   04/12/19 0700 04/12/19 0840 04/12/19 0948 04/12/19 1330  BP:  93/66    Pulse:  73    Resp: (!) 35 16 20 (!) 25  Temp: 97.6 F (36.4 C)     TempSrc: Oral     SpO2:  100%    Weight:      Height:        Intake/Output Summary (Last 24 hours) at 04/12/2019 1620 Last data filed at 04/12/2019 0350 Gross per 24 hour  Intake -  Output 1 ml  Net -1 ml   Filed Weights   May 07, 2019 1721  Weight: 51.6 kg    Examination: General appearance: Frail elderly female, obtunded. HEENT: Anicteric, conjunctival pink, lids and lashes atrophic, no nasal deformity, discharge, or epistaxis.  Lips dry, oropharynx dry Skin: Dry and tented Cardiac: RRR, no murmurs, no lower extremity edema    Respiratory: Respirations shallow, slow, no rales or wheezing Abdomen: Scaphoid MSK: Severe diffuse loss of subcutaneous muscle mass and fat Neuro: Patient is obtunded.  She does not move when stimulated, does not recall from noxious stimuli, makes no spontaneous verbalizations or movements. Psych: Unable to assess      Data Reviewed: I have personally reviewed following labs and imaging studies:  CBC: Recent Labs  Lab 07-May-2019 1739  WBC 4.5  NEUTROABS 3.9  HGB 16.4*  HCT 53.2*  MCV 87.6  PLT 161   Basic Metabolic Panel: Recent Labs  Lab 05/07/19 1840  NA 160*  K 4.1  CL 123*  CO2 18*  GLUCOSE 116*  BUN 98*  CREATININE 3.05*  CALCIUM 9.2   GFR: Estimated Creatinine Clearance: 9.5 mL/min (A) (by C-G formula based on SCr of 3.05 mg/dL (H)). Liver Function Tests: Recent Labs  Lab 2019/06/03 1840  AST 20  ALT 11  ALKPHOS 59  BILITOT 0.8  PROT 7.0  ALBUMIN 2.9*   No results for input(s): LIPASE, AMYLASE in the last 168 hours. No results for input(s): AMMONIA in the last 168 hours. Coagulation Profile: Recent Labs  Lab 2019/06/03 2243  INR 1.3*   Cardiac Enzymes: No results for input(s): CKTOTAL, CKMB, CKMBINDEX, TROPONINI in the last 168 hours. BNP (last 3  results) No results for input(s): PROBNP in the last 8760 hours. HbA1C: No results for input(s): HGBA1C in the last 72 hours. CBG: Recent Labs  Lab 04/10/19 0033 04/10/19 0425  GLUCAP 97 136*   Lipid Profile: No results for input(s): CHOL, HDL, LDLCALC, TRIG, CHOLHDL, LDLDIRECT in the last 72 hours. Thyroid Function Tests: No results for input(s): TSH, T4TOTAL, FREET4, T3FREE, THYROIDAB in the last 72 hours. Anemia Panel: No results for input(s): VITAMINB12, FOLATE, FERRITIN, TIBC, IRON, RETICCTPCT in the last 72 hours. Urine analysis:    Component Value Date/Time   COLORURINE AMBER (A) 2020/05/2819 1735   APPEARANCEUR CLOUDY (A) 2020/05/2819 1735   LABSPEC 1.019 2020/05/2819 1735   PHURINE 5.0 2020/05/2819 1735   GLUCOSEU NEGATIVE 2020/05/2819 1735   HGBUR SMALL (A) 2020/05/2819 1735   BILIRUBINUR NEGATIVE 2020/05/2819 1735   KETONESUR NEGATIVE 2020/05/2819 1735   PROTEINUR NEGATIVE 2020/05/2819 1735   UROBILINOGEN 0.2 01/11/2015 2338   NITRITE NEGATIVE 2020/05/2819 1735   LEUKOCYTESUR TRACE (A) 2020/05/2819 1735   Sepsis Labs: @LABRCNTIP (procalcitonin:4,lacticacidven:4)  ) Recent Results (from the past 240 hour(s))  Culture, blood (routine x 2)     Status: None (Preliminary result)   Collection Time: 2019/06/03  5:39 PM   Specimen: BLOOD  Result Value Ref Range Status   Specimen Description BLOOD LEFT ANTECUBITAL  Final   Special Requests   Final    BOTTLES DRAWN AEROBIC AND ANAEROBIC Blood Culture adequate volume   Culture   Final    NO GROWTH 2 DAYS Performed at [facility], [address] [place], [place] [address]    Report Status PENDING  Incomplete  Culture, blood (routine x 2)     Status: None (Preliminary result)   Collection Time: 2019/06/03  5:40 PM   Specimen: BLOOD  Result Value Ref Range Status   Specimen Description BLOOD BLOOD RIGHT FOREARM  Final   Special Requests   Final    BOTTLES DRAWN AEROBIC AND ANAEROBIC Blood Culture adequate volume   Culture    Final    NO GROWTH 2 DAYS Performed at [facility], [address] [place], [place] [address]    Report Status PENDING  Incomplete  SARS CORONAVIRUS 2 (TAT 6-24 HRS) Nasopharyngeal Nasopharyngeal Swab     Status: Abnormal   Collection Time: 2019/06/03  6:34 PM   Specimen: Nasopharyngeal Swab  Result Value Ref Range Status   SARS Coronavirus 2 POSITIVE (A) NEGATIVE Final    Comment: RESULT CALLED TO, READ BACK BY AND VERIFIED WITH: R DAVID,RN 0336 04/10/2019 D BRADLEY (NOTE) SARS-CoV-2 target nucleic acids are DETECTED. The SARS-CoV-2 RNA is generally detectable in upper and lower respiratory specimens during the acute phase of infection. Positive results are indicative of active infection with SARS-CoV-2. Clinical  correlation with patient history and other diagnostic information is necessary to determine patient infection status.  Positive results do  not rule out bacterial infection or co-infection with other viruses. The expected result is Negative. Fact Sheet for Patients: SugarRoll.be Fact Sheet for Healthcare Providers: https://www.woods-mathews.com/ This test is not yet approved or cleared by the [place] FDA and  has been authorized for detection and/or diagnosis of SARS-CoV-2 by FDA under an Emergency Use Authorization (EUA). This EUA will remain  in effect (meaning this test can be used) for t he duration of the COVID-19 declaration under Section 564(b)(1) of the Act, 21 U.S.C. section 360bbb-3(b)(1), unless the authorization is terminated or revoked sooner. Performed at [facility], [place] [address], [place], [place] [address]          Radiology Studies: No results found.      Scheduled Meds: Continuous Infusions:   LOS: 3 days    Time spent: 25 minutes    Edwin Dada, MD [facility] 04/12/2019, 4:20 PM     Please page through AMION:  [email or web address] Password [email or web address] If  7PM-7AM, please contact night-coverage

## 2019-04-14 LAB — CULTURE, BLOOD (ROUTINE X 2)
Culture: NO GROWTH
Culture: NO GROWTH
Special Requests: ADEQUATE
Special Requests: ADEQUATE

## 2019-05-07 NOTE — Progress Notes (Signed)
Alerted by CCMD to check on patient. Upon entering room., pt with no respirations, no heart beat, pacer spikes still visible, no pulse. Pt a No Code, Nurse may Pronounce. Pt pronounced @ 0446. Daughter, [name] notified. AC notified. [facility] notified.

## 2019-05-07 NOTE — Death Summary Note (Signed)
Expiration Note/ Death Summary  Tamara Campbell  MR#: [id number]  DOB:1930-10-18  Date of Admission: April 28, 2019 Date of Death: 05/02/19  Attending [place]  Patient's PCP: Juluis Pitch, MD  Consults: Palliative care  Cause of Death: COVID-19  Secondary Diagnoses Present on Admission: . SIRS (systemic inflammatory response syndrome) (HCC) . Vascular dementia without behavioral disturbance ([place]) . Paroxysmal atrial fibrillation (HCC) . Controlled type 2 diabetes mellitus with neurologic complication, without long-term current use of insulin ([place]) . Chronic systolic (congestive) heart failure ([place]) . CAP (community acquired pneumonia) . Hypernatremia . Acute respiratory failure with hypoxia ([place]) . Pneumonia due to COVID-19 virus    Brief H and P: For complete details please refer to admission H and P, but in brief [name] was an 85 year old female with advanced dementia, complete heart block with pacer, PAF not on anticoagulation due to falls, history of stroke, and diabetes who presented with progressive confusion and poor intake from her SNF.  In the ER, the patient's mentation was poor, she was hypotensive and hypothermic.  Chest x-ray showed pneumonia, CT the head was unremarkable, and she was noted to have a creatinine of 5 (from a baseline of 0.9).  Her coronavirus PCR was positive.    Hospital Course: Due to her limited functional status at baseline and the severity of her multiorgan failure, it was felt that there was no likely recovery of a meaningful quality of life.  Palliative care were consulted, who discussed with family, and the patient's daughter asked that comfort measures be provided, but that no extreme measures or artificial means would prolong the patient's suffering.  She was given morphine intermittently for pain, and passed comfortably at 4:46AM on May 02, 2019.          Signed:  Edwin Dada M.D. [facility] May 02, 2019, 2:19 PM Pager: [phone]

## 2019-05-07 DEATH — deceased
